# Patient Record
Sex: Male | Born: 1975
Health system: Southern US, Community
[De-identification: ages and names within clinical notes are randomized; demographics above are authoritative.]

## PROBLEM LIST (undated history)

## (undated) ENCOUNTER — Encounter

## (undated) ENCOUNTER — Ambulatory Visit: Payer: MEDICARE

## (undated) ENCOUNTER — Encounter: Attending: Nephrology | Primary: Nephrology

## (undated) ENCOUNTER — Ambulatory Visit: Payer: MEDICARE | Attending: Nephrology | Primary: Nephrology

## (undated) ENCOUNTER — Ambulatory Visit

## (undated) ENCOUNTER — Telehealth

## (undated) ENCOUNTER — Telehealth: Attending: Surgery | Primary: Surgery

## (undated) ENCOUNTER — Encounter: Attending: Physician Assistant | Primary: Physician Assistant

## (undated) ENCOUNTER — Encounter
Attending: Student in an Organized Health Care Education/Training Program | Primary: Student in an Organized Health Care Education/Training Program

## (undated) ENCOUNTER — Encounter: Payer: MEDICARE | Attending: Nephrology | Primary: Nephrology

## (undated) ENCOUNTER — Ambulatory Visit: Payer: MEDICARE | Attending: Surgery | Primary: Surgery

## (undated) DIAGNOSIS — F419 Anxiety disorder, unspecified: Secondary | ICD-10-CM

## (undated) DIAGNOSIS — G2581 Restless legs syndrome: Secondary | ICD-10-CM

## (undated) DIAGNOSIS — K219 Gastro-esophageal reflux disease without esophagitis: Secondary | ICD-10-CM

## (undated) DIAGNOSIS — Q851 Tuberous sclerosis: Secondary | ICD-10-CM

## (undated) DIAGNOSIS — N189 Chronic kidney disease, unspecified: Secondary | ICD-10-CM

## (undated) DIAGNOSIS — I10 Essential (primary) hypertension: Secondary | ICD-10-CM

## (undated) DIAGNOSIS — R519 Headache, unspecified: Secondary | ICD-10-CM

## (undated) DIAGNOSIS — D849 Immunodeficiency, unspecified: Secondary | ICD-10-CM

## (undated) DIAGNOSIS — F988 Other specified behavioral and emotional disorders with onset usually occurring in childhood and adolescence: Secondary | ICD-10-CM

## (undated) DIAGNOSIS — R569 Unspecified convulsions: Secondary | ICD-10-CM

## (undated) DIAGNOSIS — Z973 Presence of spectacles and contact lenses: Secondary | ICD-10-CM

## (undated) DIAGNOSIS — I7121 Aneurysm of the ascending aorta, without rupture: Secondary | ICD-10-CM

## (undated) DIAGNOSIS — A419 Sepsis, unspecified organism: Secondary | ICD-10-CM

## (undated) DIAGNOSIS — R011 Cardiac murmur, unspecified: Secondary | ICD-10-CM

## (undated) DIAGNOSIS — L989 Disorder of the skin and subcutaneous tissue, unspecified: Secondary | ICD-10-CM

## (undated) DIAGNOSIS — R51 Headache: Secondary | ICD-10-CM

## (undated) DIAGNOSIS — Z8489 Family history of other specified conditions: Secondary | ICD-10-CM

## (undated) DIAGNOSIS — L0231 Cutaneous abscess of buttock: Secondary | ICD-10-CM

## (undated) HISTORY — PX: NEPHRECTOMY: SHX65

## (undated) HISTORY — DX: Essential (primary) hypertension: I10

## (undated) HISTORY — DX: Sepsis, unspecified organism: A41.9

## (undated) HISTORY — DX: Cutaneous abscess of buttock: L02.31

## (undated) HISTORY — DX: Chronic kidney disease, unspecified: N18.9

## (undated) HISTORY — DX: Unspecified convulsions: R56.9

## (undated) HISTORY — PX: MULTIPLE TOOTH EXTRACTIONS: SHX2053

## (undated) HISTORY — PX: AV FISTULA PLACEMENT: SHX1204

## (undated) HISTORY — PX: MASS EXCISION: SHX2000

---
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
## 1997-09-01 ENCOUNTER — Other Ambulatory Visit: Admission: RE | Admit: 1997-09-01 | Discharge: 1997-09-01 | Payer: Self-pay | Admitting: Nephrology

## 1997-09-07 ENCOUNTER — Other Ambulatory Visit: Admission: RE | Admit: 1997-09-07 | Discharge: 1997-09-07 | Payer: Self-pay | Admitting: Nephrology

## 1997-09-09 ENCOUNTER — Other Ambulatory Visit: Admission: RE | Admit: 1997-09-09 | Discharge: 1997-09-09 | Payer: Self-pay | Admitting: Nephrology

## 1997-09-29 ENCOUNTER — Other Ambulatory Visit: Admission: RE | Admit: 1997-09-29 | Discharge: 1997-09-29 | Payer: Self-pay | Admitting: Nephrology

## 1997-10-20 ENCOUNTER — Other Ambulatory Visit: Admission: RE | Admit: 1997-10-20 | Discharge: 1997-10-20 | Payer: Self-pay | Admitting: Nephrology

## 1997-11-10 ENCOUNTER — Other Ambulatory Visit: Admission: RE | Admit: 1997-11-10 | Discharge: 1997-11-10 | Payer: Self-pay | Admitting: Nephrology

## 1997-11-17 ENCOUNTER — Encounter (HOSPITAL_COMMUNITY): Admission: RE | Admit: 1997-11-17 | Discharge: 1998-02-15 | Payer: Self-pay | Admitting: Nephrology

## 1997-11-21 ENCOUNTER — Inpatient Hospital Stay (HOSPITAL_COMMUNITY): Admission: EM | Admit: 1997-11-21 | Discharge: 1997-11-30 | Payer: Self-pay | Admitting: Emergency Medicine

## 1998-01-27 ENCOUNTER — Inpatient Hospital Stay (HOSPITAL_COMMUNITY): Admission: RE | Admit: 1998-01-27 | Discharge: 1998-01-27 | Payer: Self-pay | Admitting: Urology

## 1998-02-10 ENCOUNTER — Inpatient Hospital Stay (HOSPITAL_COMMUNITY): Admission: RE | Admit: 1998-02-10 | Discharge: 1998-02-19 | Payer: Self-pay | Admitting: Urology

## 1998-02-14 ENCOUNTER — Encounter: Payer: Self-pay | Admitting: Nephrology

## 1998-08-18 ENCOUNTER — Ambulatory Visit (HOSPITAL_COMMUNITY): Admission: RE | Admit: 1998-08-18 | Discharge: 1998-08-18 | Payer: Self-pay | Admitting: *Deleted

## 1998-09-08 ENCOUNTER — Ambulatory Visit (HOSPITAL_COMMUNITY): Admission: RE | Admit: 1998-09-08 | Discharge: 1998-09-08 | Payer: Self-pay | Admitting: Nephrology

## 1998-11-26 ENCOUNTER — Ambulatory Visit (HOSPITAL_COMMUNITY): Admission: RE | Admit: 1998-11-26 | Discharge: 1998-11-26 | Payer: Self-pay | Admitting: Nephrology

## 1999-02-22 ENCOUNTER — Inpatient Hospital Stay (HOSPITAL_COMMUNITY): Admission: RE | Admit: 1999-02-22 | Discharge: 1999-03-01 | Payer: Self-pay | Admitting: Urology

## 1999-02-23 ENCOUNTER — Encounter: Payer: Self-pay | Admitting: Urology

## 1999-05-16 HISTORY — PX: KIDNEY TRANSPLANT: SHX239

## 1999-05-20 ENCOUNTER — Encounter: Admission: RE | Admit: 1999-05-20 | Discharge: 1999-05-20 | Payer: Self-pay | Admitting: Urology

## 1999-05-23 ENCOUNTER — Encounter: Admission: RE | Admit: 1999-05-23 | Discharge: 1999-05-23 | Payer: Self-pay | Admitting: Urology

## 1999-05-23 ENCOUNTER — Encounter: Payer: Self-pay | Admitting: Urology

## 1999-12-26 ENCOUNTER — Emergency Department (HOSPITAL_COMMUNITY): Admission: EM | Admit: 1999-12-26 | Discharge: 1999-12-26 | Payer: Self-pay | Admitting: *Deleted

## 1999-12-26 ENCOUNTER — Encounter: Payer: Self-pay | Admitting: Emergency Medicine

## 2001-09-01 ENCOUNTER — Encounter: Payer: Self-pay | Admitting: Nephrology

## 2001-09-01 ENCOUNTER — Inpatient Hospital Stay (HOSPITAL_COMMUNITY): Admission: EM | Admit: 2001-09-01 | Discharge: 2001-09-05 | Payer: Self-pay | Admitting: Emergency Medicine

## 2001-09-02 ENCOUNTER — Encounter: Payer: Self-pay | Admitting: Nephrology

## 2001-09-08 ENCOUNTER — Inpatient Hospital Stay (HOSPITAL_COMMUNITY): Admission: EM | Admit: 2001-09-08 | Discharge: 2001-09-10 | Payer: Self-pay | Admitting: Emergency Medicine

## 2002-06-10 ENCOUNTER — Encounter: Admission: RE | Admit: 2002-06-10 | Discharge: 2002-06-10 | Payer: Self-pay | Admitting: Psychology

## 2002-08-08 ENCOUNTER — Encounter: Payer: Self-pay | Admitting: Emergency Medicine

## 2002-08-08 ENCOUNTER — Inpatient Hospital Stay (HOSPITAL_COMMUNITY): Admission: EM | Admit: 2002-08-08 | Discharge: 2002-08-13 | Payer: Self-pay | Admitting: Emergency Medicine

## 2002-08-11 ENCOUNTER — Encounter: Payer: Self-pay | Admitting: Nephrology

## 2003-03-10 ENCOUNTER — Emergency Department (HOSPITAL_COMMUNITY): Admission: AD | Admit: 2003-03-10 | Discharge: 2003-03-10 | Payer: Self-pay | Admitting: Family Medicine

## 2003-04-02 ENCOUNTER — Encounter: Admission: RE | Admit: 2003-04-02 | Discharge: 2003-04-02 | Payer: Self-pay | Admitting: Family Medicine

## 2004-03-13 ENCOUNTER — Emergency Department (HOSPITAL_COMMUNITY): Admission: EM | Admit: 2004-03-13 | Discharge: 2004-03-13 | Payer: Self-pay | Admitting: Family Medicine

## 2005-01-19 ENCOUNTER — Emergency Department (HOSPITAL_COMMUNITY): Admission: EM | Admit: 2005-01-19 | Discharge: 2005-01-19 | Payer: Self-pay | Admitting: Emergency Medicine

## 2005-02-02 ENCOUNTER — Ambulatory Visit: Payer: Self-pay | Admitting: Family Medicine

## 2005-03-28 ENCOUNTER — Emergency Department (HOSPITAL_COMMUNITY): Admission: EM | Admit: 2005-03-28 | Discharge: 2005-03-28 | Payer: Self-pay | Admitting: Family Medicine

## 2005-08-24 ENCOUNTER — Emergency Department (HOSPITAL_COMMUNITY): Admission: EM | Admit: 2005-08-24 | Discharge: 2005-08-24 | Payer: Self-pay | Admitting: Family Medicine

## 2005-09-13 ENCOUNTER — Encounter: Payer: Self-pay | Admitting: Urology

## 2006-02-21 ENCOUNTER — Ambulatory Visit: Payer: Self-pay | Admitting: Sports Medicine

## 2006-02-23 ENCOUNTER — Emergency Department (HOSPITAL_COMMUNITY): Admission: EM | Admit: 2006-02-23 | Discharge: 2006-02-23 | Payer: Self-pay | Admitting: Family Medicine

## 2006-04-20 ENCOUNTER — Inpatient Hospital Stay (HOSPITAL_COMMUNITY): Admission: EM | Admit: 2006-04-20 | Discharge: 2006-04-22 | Payer: Self-pay | Admitting: Emergency Medicine

## 2006-05-17 ENCOUNTER — Ambulatory Visit: Payer: Self-pay

## 2006-05-17 ENCOUNTER — Encounter: Payer: Self-pay | Admitting: Cardiology

## 2007-03-08 ENCOUNTER — Ambulatory Visit: Payer: Self-pay | Admitting: Family Medicine

## 2008-02-19 ENCOUNTER — Ambulatory Visit: Payer: Self-pay | Admitting: Family Medicine

## 2008-09-25 ENCOUNTER — Emergency Department (HOSPITAL_COMMUNITY): Admission: EM | Admit: 2008-09-25 | Discharge: 2008-09-25 | Payer: Self-pay | Admitting: Family Medicine

## 2008-10-21 ENCOUNTER — Emergency Department (HOSPITAL_COMMUNITY): Admission: EM | Admit: 2008-10-21 | Discharge: 2008-10-21 | Payer: Self-pay | Admitting: Emergency Medicine

## 2008-11-25 ENCOUNTER — Ambulatory Visit: Payer: Self-pay | Admitting: Family Medicine

## 2008-11-25 DIAGNOSIS — H9190 Unspecified hearing loss, unspecified ear: Secondary | ICD-10-CM | POA: Insufficient documentation

## 2008-12-31 ENCOUNTER — Ambulatory Visit: Payer: Self-pay | Admitting: Family Medicine

## 2008-12-31 DIAGNOSIS — N186 End stage renal disease: Secondary | ICD-10-CM | POA: Insufficient documentation

## 2008-12-31 DIAGNOSIS — L049 Acute lymphadenitis, unspecified: Secondary | ICD-10-CM | POA: Insufficient documentation

## 2009-02-24 ENCOUNTER — Ambulatory Visit: Payer: Self-pay | Admitting: Family Medicine

## 2010-02-13 ENCOUNTER — Emergency Department (HOSPITAL_COMMUNITY): Admission: EM | Admit: 2010-02-13 | Discharge: 2010-02-13 | Payer: Self-pay | Admitting: Family Medicine

## 2010-02-14 ENCOUNTER — Telehealth: Payer: Self-pay | Admitting: *Deleted

## 2010-02-15 ENCOUNTER — Emergency Department (HOSPITAL_COMMUNITY): Admission: EM | Admit: 2010-02-15 | Discharge: 2010-02-15 | Payer: Self-pay | Admitting: Emergency Medicine

## 2010-06-14 NOTE — Progress Notes (Signed)
Summary: Flu Shot [employer] Note Call from Patient Call back at [employer] [phone]   Caller: mom-Shelby Summary of Call: Pt has had fever off and on over weekend and has cold. Scheduled for flu shot on the 5h can he still have it? Initial call taken by: Clydell Hakim,  February 14, 2010 3:17 PM  Follow-up for Phone Call        LVM for pt to call back. Spoke with lynn also and she advises that if the pt has had a fever to just reschedule flu shot untill he feels better. Follow-up by: Jimmy Footman, CMA,  February 14, 2010 5:26 PM  Additional Follow-up for Phone Call Additional follow up Details #1::        LVM for patient to call back office. Additional Follow-up by: Jimmy Footman, CMA,  February 15, 2010 10:14 AM    Additional Follow-up for Phone Call Additional follow up Details #2::    Attempted one more time to call pt LVM. pt still does have an appt for flu shot tomorrow.  Follow-up by: Jimmy Footman, CMA,  February 15, 2010 2:46 PM  Additional Follow-up for Phone Call Additional follow up Details #3:: Details for Additional Follow-up Action Taken: I could not get in touch with patient nor did they call me back. Today is the pt's appt for flu shot and i could not give them the message Additional Follow-up by: Jimmy Footman, CMA,  February 16, 2010 9:05 AM

## 2010-07-27 LAB — POCT URINALYSIS DIPSTICK
Glucose, UA: NEGATIVE mg/dL
Hgb urine dipstick: NEGATIVE
Nitrite: NEGATIVE
Protein, ur: 100 mg/dL — AB
Urobilinogen, UA: 1 mg/dL (ref 0.0–1.0)
pH: 5.5 (ref 5.0–8.0)

## 2010-07-27 LAB — URINE MICROSCOPIC-ADD ON

## 2010-07-27 LAB — CBC
HCT: 38.6 % — ABNORMAL LOW (ref 39.0–52.0)
MCV: 90 fL (ref 78.0–100.0)
RDW: 12.7 % (ref 11.5–15.5)
WBC: 7 10*3/uL (ref 4.0–10.5)

## 2010-07-27 LAB — URINALYSIS, ROUTINE W REFLEX MICROSCOPIC
Glucose, UA: NEGATIVE mg/dL
Hgb urine dipstick: NEGATIVE
Leukocytes, UA: NEGATIVE
Urobilinogen, UA: 1 mg/dL (ref 0.0–1.0)
pH: 5.5 (ref 5.0–8.0)

## 2010-07-27 LAB — POCT I-STAT, CHEM 8
BUN: 29 mg/dL — ABNORMAL HIGH (ref 6–23)
BUN: 31 mg/dL — ABNORMAL HIGH (ref 6–23)
Calcium, Ion: 1.2 mmol/L (ref 1.12–1.32)
Creatinine, Ser: 1.9 mg/dL — ABNORMAL HIGH (ref 0.4–1.5)
Glucose, Bld: 102 mg/dL — ABNORMAL HIGH (ref 70–99)
Glucose, Bld: 98 mg/dL (ref 70–99)
HCT: 39 % (ref 39.0–52.0)
Hemoglobin: 13.3 g/dL (ref 13.0–17.0)
Potassium: 4.2 mEq/L (ref 3.5–5.1)
TCO2: 27 mmol/L (ref 0–100)
TCO2: 30 mmol/L (ref 0–100)

## 2010-07-27 LAB — DIFFERENTIAL
Lymphocytes Relative: 29 % (ref 12–46)
Lymphs Abs: 2.1 10*3/uL (ref 0.7–4.0)
Monocytes Relative: 9 % (ref 3–12)

## 2010-08-29 ENCOUNTER — Emergency Department (HOSPITAL_COMMUNITY): Payer: Medicaid Other

## 2010-08-29 ENCOUNTER — Emergency Department (HOSPITAL_COMMUNITY)
Admission: EM | Admit: 2010-08-29 | Discharge: 2010-08-29 | Disposition: A | Discharge status: Home / Self Care | Payer: Medicaid Other | Attending: Emergency Medicine | Admitting: Emergency Medicine

## 2010-08-29 DIAGNOSIS — S9030XA Contusion of unspecified foot, initial encounter: Secondary | ICD-10-CM | POA: Insufficient documentation

## 2010-08-29 DIAGNOSIS — X58XXXA Exposure to other specified factors, initial encounter: Secondary | ICD-10-CM | POA: Insufficient documentation

## 2010-09-30 NOTE — H&P (Signed)
NAME:  Tyler Alvarez, Tyler Alvarez NO.:  [id number]   MEDICAL RECORD NO.:  [id number]          PATIENT TYPE:  INP   LOCATION:  5507                         FACILITY:  [facility]   PHYSICIAN:  Terrial Rhodes, M.D.DATE OF BIRTH:  1975/10/15   DATE OF ADMISSION:  04/20/2006  DATE OF DISCHARGE:                              HISTORY & PHYSICAL   RENAL ADMISSION HISTORY AND PHYSICAL   CHIEF COMPLAINT:  Nausea, vomiting and diarrhea.   HISTORY OF PRESENT ILLNESS:  Tyler Alvarez is a 35 year old Caucasian male  with a past medical history significant for end-stage renal disease  secondary to tuberous sclerosis, status post living related kidney  transplant on February 21, 2006 at [facility].  Patient patient's  mother, he had acute onset of nausea, vomiting and diarrhea at  approximately 10 p.m. last night.  He was unable to hold anything down  and had refractory nausea and vomiting.  He could not tolerate sips of  water or soup and EMS was called because he was unable to walk and EMS  was called and brought to the emergency room.  Upon arrival to the  emergency room, he was noted to have a fever of 103.3, was tachycardic  at 151 and his blood pressure was stable.  Pulse oximetry was stable.  He was minimally arousable.  He was resuscitated with IV fluids and his  heart rate improved.  He was also treated with Tylenol and improved his  temperature.  An LP was done due to altered mental status, which was  without evidence of meningitis.  His mother had the same dinner last  night.  She denies any sick contacts.  No one else was sick at home.  He  was feeling well prior to this episode.   ALLERGIES:  TO ERYTHROMYCIN, WHICH CAUSES GI UPSET.   PAST MEDICAL HISTORY:  1. Tuberous sclerosis.      a.     Seizure disorder, on Depakote.      b.     Cystic renal disease, status post left nephrectomy.      c.     Mental retardation, mild.      d.     History of vitreous hemorrhage.      e.      Multiple lipomas.  2. Status post living related kidney transplant to end-stage renal      disease on February 22, 2000.  3. Hypertension.  4. Pyelonephritis.  5. Hypercalcemia, secondary to tertiary hyperparathyroidism.  6. Anemia.  7. Osteoporosis.  8. Bladder dysfunction.   CURRENT MEDICATIONS:  1. Depakote 500 mg a day.  2. Imipramine 50 mg a day.  3. Prednisone 5 mg a day.  4. Prograf 2 mg b.i.d.  5. Baby aspirin 1 a day.  6. Boniva 1 a month.  7. Prilosec OTC 1 a day.   SOCIAL HISTORY:  He lives in [place] with his mother and father.  He  is currently looking for work.  He is single.  No children.  No tobacco.  No alcohol and no drug use.   FAMILY HISTORY:  His  mother is alive at age 33 in good health.  Father  is alive at age 36 with COPD.  He has 2 brothers alive and well.  One of  his brothers had donated a kidney.   REVIEW OF SYSTEMS:  He had fevers, chills, sweats, decreased appetite  and fatigue.  This started yesterday around 10 o'clock, associated with  nausea, vomiting and diarrhea.  HEENT:  He also had developed a headache  in frontal area.  No blurred vision, photophobia.  CARDIAC:  No chest  pain, palpitations, orthopnea, PND.  PULMONARY:  No shortness of breath.  Mild productive cough.  GI:  Nausea, vomiting, diarrhea which was a  loose, tan color, not watery, but loose consistency.  No hematochezia,  melena or bright red blood per rectum.  GU:  No dysuria, pyuria,  hematuria, urgency, frequency or tension.  RHEUMATOLOGIC:  No  arthralgias or myalgias.  NEUROLOGIC:  He had generalized weakness and  fatigue.  DERMATOLOGIC:  No rashes, lumps or bumps.  All other systems  negative.   PHYSICAL EXAMINATION:  GENERAL:  Well-developed, frail man who appears  weak, lying in bed in no apparent distress.  VITAL SIGNS:  Temperature 103.3.  Pulse 151.  Respiratory rate 18.  Blood pressure 151/98.  Pulse oximetry 99%.  HEENT EXAM:  Normocephalic, atraumatic.   Pupils equally round and  reactive to light.  Extraocular muscles intact.  No icterus.  Oropharynx  showed dry, flaky skin with dry mucous membranes.  NECK:  Supple.  No  lymphadenopathy.  No nuchal rigidity.  LUNGS:  Clear to auscultation and percussion bilaterally.  No rales or  rhonchi.  CARDIAC EXAM:  Rate tachycardic with a flow murmur.  No precordial rub  appreciated.  ABDOMINAL EXAM:  Normoactive bowel sounds.  Soft, nontender,  nondistended.  No guarding.  No rebound.  He does have a large scar on  his abdomen.  EXTREMITIES:  No clubbing, cyanosis or edema.  He has a left upper AV  graft, palpable thrill and audible bruit.  NEUROLOGIC:  He is awake, alert and oriented x3.  He was some to  response, but appropriate.  No slurred speech.   LABS:  White blood cell count 6.7, hemoglobin 13.1, platelets 126,  sodium 137, potassium 4.2, chloride 104, CO2 24, BUN 18, creatinine 1.4,  glucose 96, calcium 8.6, albumin 3.6, AST 19, ALT 11.  Depakote level  was 40.1.  Urinalysis showed small bilirubin, negative blood, negative  protein, negative leukocytes, negative nitrites.  Cerebral spinal fluid,  2 white blood cells, 0 red blood cells, glucose 63, total protein 29.   ASSESSMENT/PLAN:  1. Nausea, vomiting and diarrhea.  Question whether or not this is      viral gastroenteritis.  I doubt that this food poisoning since they      had similar food at home.  We will continue with supportive      measures of IV fluids, antiemetics.  He has been pan cultured.  We      will send off stool for C.diff, open parasites, as well stool      cultures and fecal leukocytes.  He has had no vomiting since 3 a.m.  2. Altered mental status.  This is resolved and was likely due to his      acute illness.  Continue with IV fluids.  Lumbar puncture was      negative.  He does have mental retardation, but was appropriate and      awake.  3. Fever.  As above.  Pan cultured.  No antibiotics at this time.  We       will pan culture, hold off on antibiotics, but we may want to put      empiric Cipro.  4. End-stage renal disease, status post living related kidney      transplant.  Creatinine is stable.  Continue with Prograf and      prednisone.  5. Hypertension.  Blood pressure stable.  6. Gastroesophageal reflux disease.  We will change him to an IV      proton pump inhibitor.  7. Seizure disorder.  We will continue with Depakote.           ______________________________  Terrial Rhodes, M.D.     JC/MEDQ  D:  04/20/2006  T:  04/21/2006  Job:  [id number]

## 2010-09-30 NOTE — Discharge Summary (Signed)
NAME:  TRAVIAN, KERNER NO.:  [id number]   MEDICAL RECORD NO.:  [id number]          PATIENT TYPE:  INP   LOCATION:  5507                         FACILITY:  [facility]   PHYSICIAN:  Terrial Rhodes, M.D.DATE OF BIRTH:  [birth year]   DATE OF ADMISSION:  04/20/2006  DATE OF DISCHARGE:  04/22/2006                               DISCHARGE SUMMARY   ADMITTING DIAGNOSES:  1. Nausea, vomiting, diarrhea.  2. Fever.  3. Altered mental status.  4. End-stage renal disease status post living related kidney      transplant.  5. Hypertension.  6. Seizure disorder.   DISCHARGE DIAGNOSES:  1. Nausea, vomiting, diarrhea - resolved felt secondary to viral      gastroenteritis.  2. Altered mental status - resolved.  3. Fever - resolved felt secondary to #1.  4. End-stage renal disease with mild acute renal failure of living      related kidney transplant - resolved, creatinine at baseline.  5. Hypertension.  6. Seizure disorder.   BRIEF HISTORY:  This 35 year old white male with end-stage renal disease  secondary to tuberous sclerosis status post living related kidney  transplant on February 21, 2006 at [facility] is admitted now with  acute onset nausea, vomiting, diarrhea for approximately 12 hours.  He  has been unable to hold down any food, liquid or medications and has  refractory nausea and vomiting.  In the emergency room his fever is  103.3 with a heart rate of 151 and a stable blood pressure.  He was  minimally arousable.  He was aggressively rehydrated with IV fluids  while still in the ER and his heart rate improved as well as his mental  status.  Tylenol brought his temperature down.  A lumbar puncture was  done without any immediate evidence of meningitis.  The patient was  feeling well prior to this episode.  His mother ate the same food as he  did on the night prior to admission.  She denies any sick contacts or  any other illnesses in the home.   LABORATORIES ON ADMISSION:  White count 6700 with 83 segs, 7  lymphocytes, 8 monocytes, 2 eosinophils, 0 basophils.  Sodium 137,  potassium 4.2, chloride 104, CO2 24, glucose 96, BUN 18, creatinine 1.4,  albumin 3.6.  Hemoglobin 13.1, platelets 126,000.  LFTs within normal  limits.  Total bilirubin 1.2.  Valproic acid low at 40.1.  Spinal fluid  appears clear and colorless with 24 red blood cells, 1 white blood cell,  29 protein, 63 glucose.  Urinalysis yellow, clear, specific gravity of  1.020, pH 5.5, small amount of bilirubin, 15 mg/dL ketones, negative  nitrite, protein, glucose.  Chest x-ray:  No acute abnormalities.  CT of  the head:  No acute intracranial findings, lipoma of the corpus callosum  seen and multiple intraparenchymal and periventricular calcifications  noted.   HOSPITAL COURSE:  The patient was admitted to a private renal room,  placed on a clear liquid diet and continued to receive IV fluids.  After  24 hours, creatinine returned slightly elevated at 1.6 and in response  his IV fluid rate was increased.  The patient was given his usual home  medications orally.  He received antiemetics and antidiarrheals.  All  blood, urine, stool and spinal fluid cultures remained negative.  His  fever defervesced.  A one-time dose of Rocephin was given empirically in  the emergency room but continuous antibiotics were not ordered.  Within  48 hours, the patient was at baseline.  He had remained afebrile for 36  hours.  He was eating and drinking and taking his medications well.  He  had no further nausea, vomiting or diarrhea.  Vital signs were within  normal limits.  He was discharged home improved.   It was felt his symptoms were attributable to a viral gastroenteritis  which resolved.  His creatinine returned to a baseline of 1.2 at time of  discharge.   DISCHARGE MEDICATIONS:  1. Depakote 500 mg daily.  2. Imipramine 50 mg daily.  3. Prednisone 5 mg daily.  4. Prograf 2 mg  b.i.d.  5. Baby aspirin 81 mg daily.  6. Boniva one monthly.  7. Prilosec 20 mg over the counter one daily.      Zenovia Jordan, P.A.    ______________________________  Terrial Rhodes, M.D.    RRK/MEDQ  D:  06/28/2006  T:  06/28/2006  Job:  [id number]

## 2010-09-30 NOTE — Discharge Summary (Signed)
[place]. [facility]  Patient:    Tyler Alvarez, MOWERS Visit Number: [id number] MRN: [id number]          Service Type: MED Location: [phone] Attending Physician:  Hans Eden Dictated by:   Maryelizabeth Rowan, M.D. Admit Date:  09/01/2001 Discharge Date: 09/05/2001   CC:         [name] Bihari. Caryn Section, M.D., [facility]  Lyla Son ___________, Kateri Mc, Kidney Transplant Coordinator   Discharge Summary  PRIMARY DIAGNOSIS:  Pyelonephritis.  SECONDARY DIAGNOSES: 1. Dehydration. 2. Status post kidney transplant. 3. Tubular sclerosis. 4. Hypertension secondary to kidney disease. 5. Status post nephrectomy times two. 6. History of renal cell carcinoma.  HOSPITAL COURSE:  This 35 year old male with significant past medical history presented complaining of fever, nausea, vomiting and kidney pain.  Patient states his belly hurt right where his kidney was and he had abrupt onset of nausea and vomiting the day of admission.  Patient was admitted to 5500, received aggressive IV fluids and empiric antibiotic therapy initially with Cipro.  The patient had a rough initial 24 hours with nausea and vomiting times two, but this subsided after his first night.  He received aggressive IV fluids and became afebrile on day two of his hospitalization.  Urine cultures came back with coag negative Staph, sensitive to levofloxacin as well as gentamicin, nitrofurantoin, rifampin, trimethoprim, and vanc.  Patient was switched to IV Tequin until he had been afebrile for over 24 hours at which time he was switched to p.o. antibiotics.  He tolerated this well.  DISCHARGE CONDITION:  Patient was discharged home in stable condition, afebrile and tolerating p.o.s very well and ambulating without difficulty.  DIET:  No restrictions.  WOUND CARE:  None.  DISCHARGE MEDICATIONS: 1. Prograf 3 mg b.i.d. 2. Prednisone 5 mg q.d. 3. Aspirin 325 q.d. 4. Imipramine 25 mg  q.h.s. 5. _____ 40 q.d. 6. Depakote 250 2 pills q.d. 7. Norvasc 2.5 mg q.d. 8. Tequin 400 mg q.d. times 14 days.  SPECIAL INSTRUCTIONS:  Call Lyla Son _____ for an appointment to see a transplant urologist and notify them of this second bout of pyelonephritis as he had pyelo approximately a year ago at this time.  Other follow up is with his primary doctor, Dr. Caryn Section, at [facility] on Wednesday, May 14th at 10:30. Dictated by:   Maryelizabeth Rowan, M.D. Attending Physician:  Hans Eden DD:  09/05/01 TD:  09/05/01 Job: [id number]

## 2010-09-30 NOTE — Discharge Summary (Signed)
[place]. [facility]  Patient:    Tyler Alvarez, Tyler Alvarez Visit Number: [id number] MRN: [id number]          Service Type: MED Location: [id number] Attending Physician:  Maree Krabbe Dictated by:   Ocie Doyne, M.D. Admit Date:  09/08/2001 Discharge Date: 09/10/2001                             Discharge Summary  DISCHARGE DIAGNOSES: 1. Left upper extremity cellulitis versus hematoma. 2. History of renal transplant. 3. History of pyelonephritis, discharged on September 05, 2001. 4. Tuberous sclerosis. 5. Seizure disorder. 6. Hypertension. 7. Gastroesophageal reflux disease. 8. History of renal cell carcinoma status post nephrectomy.  DISCHARGE MEDICATIONS: 1. Prograf 3 mg p.o. b.i.d. 2. Prednisone 5 mg p.o. q.d. 3. Aspirin 325 mg p.o. q.d. 4. Imipramine 25 mg p.o. q.h.s. 5. Nexium 40 mg p.o. q.d. 6. Depakote 250 mg 2 tablets p.o. q.d. 7. Norvasc 2.5 mg p.o. q.d. 8. Bactrim DS 1 tablet p.o. b.i.d. x 7 days.  HISTORY OF PRESENT ILLNESS:  This 35 year old white male, who was recently hospitalized from April 20 to April 24 with pyelonephritis, returned to the emergency room on April 27.  He complained of a two-day history of left upper arm redness, swelling, and pain.  He stated it had begun the day he left the hospital but was progressively worsening.  He denied fever, chills, nausea, vomiting, injury to the arm, or any blood draws at that site.  He does have a fistula, but it was not in use.  He was continuing to take Tequin as prescribed at discharge.  On admission, he was afebrile, and vital signs were stable.  He does not have leukocytosis.  For remainder of exam, refer to admission History & Physical.  HOSPITAL COURSE:  #1 - CELLULITIS VERSUS HEMATOMA:  The left upper extremity fistula appeared to be a focus of infection.  This whole area on exam was extremely indurated, warm, and painful.  He was started on IV vancomycin to cover  gram-positive organisms, and blood cultures were drawn which remained negative throughout the hospitalization.  He was afebrile with no leukocytosis.  On review of his urine culture from the previous weeks admission, his urine grew greater than 100,000 colonies of coagulase-negative staph which was sensitive to levofloxacin, nitrofurantoin, trimethoprim, Sulphamethoxazole, and vancomycin. It was resistant to tetracycline, penicillin, oxacillin, and cefazolin. Because of the location of his symptoms, CVTS was requested evaluate him.  A Doppler ultrasound of the left upper arm revealed proximal anastomosis of the A-V fistula patent.  There was dilatation of the mid portion of the fistula with distal occlusion versus thrombus.  There was no evidence for an abscess. By hospital day #2, some purple-green discoloration was noted around the left upper extremity consistent with resolution of a hematoma.  He remained afebrile and was feeling well.  He was, therefore, discharged home on Bactrim which will adequately cover his recent pyelonephritis as well as gram-positive organism likely to be causative of cellulitis.  #2 - HISTORY OF PYELONEPHRITIS:  He has now had two episodes, most recently September 01, 2001, and also May 2002 for which he was treated in [place].  He does have a renal transplant.  These recurrent infections are very concerning.  He is, therefore, referred back to [facility] for an appointment to see a transplant urologist, and his mother has the telephone number for the transplant coordinator.  Followup will be with Dr. Caryn Section on Wednesday, May 14, at 10:30 at [facility] Dictated by:   Ocie Doyne, M.D. Attending Physician:  Maree Krabbe DD:  09/10/01 TD:  09/10/01 Job: [id number]

## 2010-09-30 NOTE — H&P (Signed)
[place]. [facility]  Patient:    Tyler Alvarez Visit Number: [id number] MRN: [id number]          Service Type: SUR Location: 5500 [id number] Attending Physician:  Laqueta Jean Admit Date:  02/22/1999   CC:         Lucrezia Starch. August Luz, M.D.                         History and Physical  HISTORY OF PRESENT ILLNESS:  Mr. Tyler Alvarez is a 35 year old male, with known tubular sclerosis complex since age 39, with progressive end-stage renal disease.  The patient was in pre-transplant evaluation at [facility], with a history of multiple episodes of gross hematuria, but in September, 1999, he developed a massive retroperitoneal bleed, which required a nephrectomy.  Pathology showed renal cell carcinoma.  The patient then presented for extirpation of the left kidney to rule-out any other renal cell carcinoma.  This was accomplished in October, 1999. The patient did well since that surgery, but on recent CT scan, the patient was  noted to have a cystic fluid filled mass in the left retroperitoneum with a solid base.  This was biopsied, but no diagnosis was rendered.  The x-rays have been reviewed with the patient and the patients parents, and the patient has decided to proceed with exploration and excisional biopsy of the base of the mass.  It was  felt that the patient needs to have this performed in order to qualify for transplantation at [facility].  The patient is admitted after his usual dialysis for surgery tomorrow morning.   PAST MEDICAL HISTORY:  Noncontributory.  SOCIAL HISTORY:  Noncontributory.  FAMILY HISTORY:  Noncontributory.  DIAGNOSES: 1. Tubular sclerosis complex. 2. End-stage renal disease on dialysis. 3. Renal cell carcinoma. 4. Hypertension. 5. Seizure disorder. 6. Attention deficit disorder.  ADMITTING MEDICATIONS: 1. Depakote 250 mg 2 tablets h.s. 2. Atenolol 50 mg q.d. 3. Imipramine 25 mg h.s. 4. Iron 1 per  day.  ADMISSION PHYSICAL EXAMINATION:  VITAL SIGNS:  Temperature 98.3, heart rate 98, respiratory 20, and blood pressure 117/79.  GENERAL:  A thin, white male with two healed chevron incisions on his abdomen.   HEENT:  PERL.  There is facial acne present, typical of tubular sclerosis complex form and pattern.  CHEST:  Scaphoid.  Clear to auscultation and percussion.  COR is S1 and S2 normal without heaves, rubs, or murmurs.  ABDOMEN:  Scaphoid.  His abdomen has two well-healed chevron incisions.  Soft plus bowel sounds without organomegaly.  No masses (CT scan shows left retroperitoneal mass).  GASTROINTESTINAL:  The patient is status post dialysis this morning.  He has working AV fistula present.  GENITALIA:  Normal male external genitalia.  RECTAL:  Not examined this admission.  EXTREMITIES:  Without cyanosis or edema.  NEUROLOGIC:  Physiologic.  ADMITTING IMPRESSION: 1. History of right renal cell carcinoma found on extirpation of right hemorrhaging    renal unit in September, 1999. 2. Status post left nephrectomy in October, 1999, now with a left retroperitoneal    mass without diagnosis on percutaneous biopsy.  PLAN:  Will plan left abdominal exploration and left retroperitoneal excisional  biopsy in the a.m. Attending Physician:  Laqueta Jean DD:  02/22/99 TD:  02/23/99 Job: [id number]

## 2010-09-30 NOTE — Discharge Summary (Signed)
NAME:  Tyler Alvarez, Tyler Alvarez NO.:  [id number]   MEDICAL RECORD NO.:  [id number]                   PATIENT TYPE:  INP   LOCATION:  5525                                 FACILITY:  [facility]   PHYSICIAN:  Hillery Hunter, PA             DATE OF BIRTH:  [birth year]   DATE OF ADMISSION:  08/08/2002  DATE OF DISCHARGE:  08/13/2002                                 DISCHARGE SUMMARY   ADMISSION DIAGNOSES:  The patient was admitted on August 08, 2002 because of  febrile illness and urinary tract infection.   DISCHARGE DIAGNOSES:  The patient was discharged on August 13, 2002 with:  1. Urinary tract infection secondary to methicillin resistant Staphylococcus     epidermidis.   CONSULTATIONS:  Infectious disease on August 11, 2002.   HISTORY OF PRESENT ILLNESS:  The patient was admitted because of urinary  tract infection secondary to MRSE.  The patient with history of  pyelonephritis X2 since left kidney transplant in October of 2001 at [facility].  The patient has a history of renal cell carcinoma, cystic  renal disease, hypertension,  hypercalcemia and seizure disorder.   LABORATORY DATA:  On admission sodium was 138, potassium 3.3, chloride 100,  cO2 27, BUN 17, creatinine 1.6, glucose 93.  White count 10.3, platelet  count 131,000, hemoglobin 14.9, hematocrit 42.3.  Urinalysis showed specific  gravity 1.015, pH 5.5, positive blood, positive moderate leukocytes and few  bacteria.  Albumin 4.7, calcium 10.1.  Chest x-ray negative.  Blood cultures  X2, one out of the two was positive for coagulase-negative Staphylococcus.   HOSPITAL COURSE:  The patient was treated with Phenergan 12.5 intravenous  q.6h. PRN nausea, Solu-Medrol 80 mg intravenous X1 and then prednisone 20 mg  every day and Tylenol suppositories.  The antibiotics that were used were  Tequin 200 mg p.o. then changed to Primaxin 500 mg p.o. b.i.d.  This was  changed to vancomycin 1 gram  intravenous and that was then changed to  Levaquin 500 mg p.o. q.d. and changed to Tequin 400 mg intravenous then  changed to Tequin 400 mg p.o.   Infectious disease was consulted on August 11, 2002 where they questioned  urinary reflux, obtained a renal ultrasound that showed no hydronephrosis or  mass.  They felt it was important to treat him with an indefinite course of  antibiotics.  They recommended finishing a ten day course of Tequin 400 mg  daily and then after that start Cipro 250 mg p.o. daily every day in the  afternoon paying close attention to antacid intake.   His blood pressure was well controlled.  He had no seizure activity.  He was  discharged to home with his family on August 13, 2002 understanding he would  follow a regular diet and he would follow up with Dr. Caryn Section on August 26, 2002  at 2:45 P.Lesle Chris MEDICATIONS:  1. Tequin 400 mg p.o. daily X10 days,  2. Then Cipro 250 mg p.o. daily every afternoon, two hours away from having     taken antacids.  3. Norvasc 200 mg p.o. daily.  4. Imipramine 25 mg p.o. q.h.s.  5. Nexium 40 mg p.o. daily.  6. Depakote 250 mg two tablets once daily.  7. Prograf 1 mg, two capsules twice daily.  8. Prednisone 5 mg p.o. daily.  9. Enteric coated aspirin 325 mg p.o. daily.  10.      Tylenol 325 mg one to two tablets PRN pain.                                               Hillery Hunter, PA    MJG/MEDQ  D:  09/15/2002  T:  09/15/2002  Job:  [id number]

## 2010-09-30 NOTE — H&P (Signed)
[place]  Patient:    BRYSE, BLANCHETTE Visit Number: [id number] MRN: [id number]          Service Type: MED Location: 5500 [id number] [id number] Attending Physician:  Maree Krabbe Dictated by:   Maryelizabeth Rowan, M.D. Admit Date:  09/08/2001                           History and Physical  DATE OF BIRTH:  10/04/1975.  CHIEF COMPLAINT:  Fever, nausea, and vomiting.  HISTORY OF PRESENT ILLNESS:  This 35 year old male, with a significant past medical history of tuberous sclerosis and end-stage renal disease, status post renal transplant, presents today stating that he awoke with a fever, headache, and nausea this morning.  He has had five episodes of emesis so far today. Apparently he had sudden onset in the last 24 hours of this illness as previously he was tolerating p.o. and normal activity.  PAST MEDICAL HISTORY:  Tuberous sclerosis, status post kidney transplant in October 2001 in [place].  Recent history of pyelonephritis May 2002 in [place].  He has a seizure disorder, hypertension secondary to his kidney disease, GERD, nephrectomy x 2 with a history of renal cell carcinoma, ADHD, and a history of perirenal abscess October 2000.  MEDICATIONS:  Include imipramine 25 mg q.h.s., aspirin 325 q.d., Prograf 3 mg b.i.d., prednisone 5 mg q.d., Nexium 40 mg q.d., Depakote 250 two pills q.d., Norvasc 2.5 mg q.d.  ALLERGIES:  ERYTHROMYCIN causes GI upset.  SOCIAL HISTORY:  The patient is a nonsmoker.  He has an eight grade education. He currently lives with his parents and likes to play computer games.  REVIEW OF SYSTEMS:  CONSTITUTIONAL:  Positive fever and chills.  GI:  Nausea, vomiting, and abdominal pain.  RESPIRATORY:  No shortness of breath or wheezing.  CARDIAC:  No chest pain or palpitations.  GU:  Polyuria.  No dysuria or hematuria.  EXTREMITIES:  No edema or weakness.  NEUROLOGIC: Positive headaches.  No ataxia or visual  changes.  HEME:  No easy bruisability.  LYMPH:  No new nodules.  PHYSICAL EXAMINATION:  VITAL SIGNS:  Blood pressure 104/50, pulse 80-90, respirations 20, saturating 100% on room air, temperature 101.5 degrees.  GENERAL:  Alert and oriented x 3.  SKIN:  Pale.  HEENT:  Normocephalic, atraumatic.  Mucosa is dry.  NECK:  Supple.  No JVD.  LUNGS:  CTA.  HEART:  RRR.  ABDOMEN:  Positive bowel sounds.  Tender in the left lower quadrant and right lower quadrant.  Tenderness to palpation of the transplant kidney in the left lower quadrant.  EXTREMITIES:  No edema.  Full range of motion.  LABORATORY DATA:  White count 14.9, ANC 12.3, hemoglobin 13.8, platelets 145. Sodium 134, potassium 3.4, BUN 15, creatinine 1.7.  LFTs are within normal limits.  UA is negative for nitrites, small leukocyte esterase, 15 ketones, micro with few bacteria and 3-6 white blood cells.  ASSESSMENT AND PLAN: #1 - Pyelonephritis in this 35 year old, status post renal transplant.  We will admit to 5500 and start IV antibiotics.  We will start ciprofloxacin.  We will also culture the urine and check a renal ultrasound for hydronephrosis and any possible evidence of reflux.  We will also check blood cultures and continue his immunosuppressive therapy at home.  We will also attempt to obtain old records from [facility] as he has what sounds like recurrent pyelonephritis, and he  will need a workup for some sort of structural abnormalities causing this recurrent infection.  #2 - Dehydration.  We will hydrate with IV fluids. Dictated by:   Maryelizabeth Rowan, M.D. Attending Physician:  Maree Krabbe DD:  09/01/01 TD:  09/01/01 Job: [id number]

## 2010-10-25 ENCOUNTER — Telehealth: Payer: Self-pay | Admitting: *Deleted

## 2010-10-25 NOTE — Telephone Encounter (Signed)
Mother calls stating patient scratched elbow on rusty nail a few days ago and wondering if he needs a tetanus immunization and if he needs appointment with MD in order to recieve this.   He has had a kidney transplant 11 years ago . Consulted Dr. Jennette Kettle and she advises may give tetanus without visit with MD but encourage patient to make and appointment to follow up with MD here to re- establish  since he has not been here in 2 years. He does have a renal doctor that he see regularly.

## 2010-11-08 ENCOUNTER — Encounter: Payer: Self-pay | Admitting: Family Medicine

## 2010-11-08 ENCOUNTER — Ambulatory Visit (INDEPENDENT_AMBULATORY_CARE_PROVIDER_SITE_OTHER): Payer: Medicaid Other | Admitting: Family Medicine

## 2010-11-08 VITALS — BP 142/86 | HR 95 | Temp 98.2°F | Wt 137.0 lb

## 2010-11-08 DIAGNOSIS — Z23 Encounter for immunization: Secondary | ICD-10-CM

## 2010-11-08 DIAGNOSIS — Z0000 Encounter for general adult medical examination without abnormal findings: Secondary | ICD-10-CM

## 2010-11-08 NOTE — Assessment & Plan Note (Signed)
Pt with some chronic medical conditions but seems to be doing fairly well.  He is followed by the appropriate physicians for his kidney transplant, HTN.  He may need a Cardiologist at some point to monitor his murmur but right now he is doing well.

## 2010-11-08 NOTE — Progress Notes (Signed)
  Subjective:    Patient ID: Tyler Alvarez, male    DOB: [birth year], 35 y.o.   MRN: [id number]  HPI Pt is here for an annual exam  Medical conditions 1. Seizures: takes medications as prescribed.  Has not had any seizures.  It has been multiple years since he has had a major seizure 2.  HTN:  He has been taking his blood pressure medication.  He has been checking it at home.  It has been ranging between 120-130/80-90 3. S/p kidney transplant:  Is being followed by Dr. Caryn Section.  It has been working well.  He is taking the Program and Prednisone  Health maintanence He does not drink, smoke, or do drugs He does not exercise Diet includes a lot of junk food  No other questions or concerns   Review of Systems  Constitutional: Negative for fever, chills, fatigue and unexpected weight change.  HENT: Negative for neck pain and neck stiffness.   Eyes: Negative for photophobia and pain.  Respiratory: Negative for chest tightness, shortness of breath and wheezing.   Cardiovascular: Negative for chest pain and leg swelling.  Gastrointestinal: Negative for abdominal distention.  Genitourinary: Negative for difficulty urinating.  Musculoskeletal: Negative for arthralgias.  Neurological: Negative for dizziness, tremors, seizures, speech difficulty, weakness, light-headedness, numbness and headaches.  Hematological: Negative for adenopathy.  Psychiatric/Behavioral: Negative for behavioral problems and agitation.       Objective:   Physical Exam  Vitals reviewed. Constitutional: He is oriented to person, place, and time. No distress.       Thin appearing  HENT:  Head: Normocephalic and atraumatic.  Right Ear: External ear normal.  Left Ear: External ear normal.  Mouth/Throat: No oropharyngeal exudate.  Eyes: Conjunctivae and EOM are normal. Pupils are equal, round, and reactive to light.  Neck: Normal range of motion. Neck supple. No JVD present.  Cardiovascular: Normal rate and regular  rhythm.  Exam reveals no gallop.        3/6 SEM  Pulmonary/Chest: Effort normal and breath sounds normal. No respiratory distress. He has no wheezes.       Left arm AV graft patent  Abdominal: Soft. Bowel sounds are normal. He exhibits no distension. There is no tenderness.  Musculoskeletal: Normal range of motion. He exhibits no edema.  Lymphadenopathy:    He has no cervical adenopathy.  Neurological: He is alert and oriented to person, place, and time. No cranial nerve deficit. Coordination normal.  Skin: Skin is warm and dry. No rash noted. He is not diaphoretic.  Psychiatric:       Strange affect          Assessment & Plan:

## 2011-02-09 ENCOUNTER — Ambulatory Visit (INDEPENDENT_AMBULATORY_CARE_PROVIDER_SITE_OTHER): Payer: Medicaid Other | Admitting: *Deleted

## 2011-02-09 DIAGNOSIS — Z23 Encounter for immunization: Secondary | ICD-10-CM

## 2011-04-20 DIAGNOSIS — G40209 Localization-related (focal) (partial) symptomatic epilepsy and epileptic syndromes with complex partial seizures, not intractable, without status epilepticus: Secondary | ICD-10-CM | POA: Insufficient documentation

## 2011-04-20 DIAGNOSIS — F909 Attention-deficit hyperactivity disorder, unspecified type: Secondary | ICD-10-CM | POA: Insufficient documentation

## 2011-05-23 DIAGNOSIS — Z48298 Encounter for aftercare following other organ transplant: Secondary | ICD-10-CM | POA: Insufficient documentation

## 2011-11-02 ENCOUNTER — Emergency Department (HOSPITAL_COMMUNITY)
Admission: EM | Admit: 2011-11-02 | Discharge: 2011-11-02 | Disposition: A | Discharge status: Home / Self Care | Payer: Medicaid Other | Source: Home / Self Care | Attending: Emergency Medicine | Admitting: Emergency Medicine

## 2011-11-02 ENCOUNTER — Encounter (HOSPITAL_COMMUNITY): Payer: Self-pay | Admitting: Emergency Medicine

## 2011-11-02 DIAGNOSIS — L089 Local infection of the skin and subcutaneous tissue, unspecified: Secondary | ICD-10-CM

## 2011-11-02 HISTORY — DX: Immunodeficiency, unspecified: D84.9

## 2011-11-02 HISTORY — DX: Tuberous sclerosis: Q85.1

## 2011-11-02 HISTORY — DX: Other specified behavioral and emotional disorders with onset usually occurring in childhood and adolescence: F98.8

## 2011-11-02 MED ORDER — MUPIROCIN 2 % EX OINT: TOPICAL_OINTMENT | Freq: Three times a day (TID) | CUTANEOUS | Status: AC

## 2011-11-02 MED ORDER — BACITRACIN 500 UNIT/GM EX OINT
1.0000 "application " | TOPICAL_OINTMENT | Freq: Once | CUTANEOUS | Status: AC
  Administered 2011-11-02: 1 via TOPICAL

## 2011-11-02 MED ORDER — SULFAMETHOXAZOLE-TRIMETHOPRIM 800-160 MG PO TABS: 1.0000 | ORAL_TABLET | Freq: Two times a day (BID) | ORAL | Status: DC

## 2011-11-02 NOTE — ED Provider Notes (Signed)
History     CSN: [id number]  Arrival date & time 11/02/11  1557   First MD Initiated Contact with Patient 11/02/11 1610      Chief Complaint  Patient presents with  . Abscess    (Consider location/radiation/quality/duration/timing/severity/associated sxs/prior treatment) HPI Comments: Patient with a mildly tender, erythematous mass with discoloration that appears to be a blood blister of gradually increasing size in his left neck. He has a history of tuberous sclerosis, and has multiple skin growths in this area. States that he had a whitehead, and that he has been "squeezing on it," but He does not recall any trauma to the  No drainage. No nausea, vomiting, fevers. No other, similar lesions. Patient has a history of bilateral kidney transplant, is on Prograf. He's doing well with this.  ROS as noted in HPI. All other ROS negative.    Patient is a 36 y.o. male presenting with abscess. The history is provided by the patient. No language interpreter was used.  Abscess  This is a new problem. The current episode started less than one week ago. The onset was gradual. The problem occurs rarely. The problem has been unchanged. The abscess is present on the neck. The abscess is characterized by redness and blistering. It is unknown what he was exposed to. The abscess first occurred at home. Pertinent negatives include no fever.    Past Medical History  Diagnosis Date  . Hypertension   . Chronic kidney disease     s/p transplant  . Seizures     treated by Dr. Malvin Johns  . ADD (attention deficit disorder)   . Tuberous sclerosis   . Immune deficiency disorder     Past Surgical History  Procedure Date  . Kidney transplant   . Av fistula placement     History reviewed. No pertinent family history.  History  Substance Use Topics  . Smoking status: Never Smoker   . Smokeless tobacco: Not on file  . Alcohol Use: No      Review of Systems  Constitutional: Negative for fever.     Allergies  Amoxicillin and Erythromycin  Home Medications   Current Outpatient Rx  Name Route Sig Dispense Refill  . AMLODIPINE BESYLATE 5 MG PO TABS Oral Take 2.5 mg by mouth daily.     . ASPIRIN 81 MG PO TABS Oral Take 81 mg by mouth daily.      Marland Kitchen DIVALPROEX SODIUM 250 MG PO TBEC Oral Take 500 mg by mouth 3 (three) times daily.      Marland Kitchen GABAPENTIN 300 MG PO CAPS Oral Take 300 mg by mouth daily.      . IMIPRAMINE HCL 25 MG PO TABS Oral Take 25 mg by mouth at bedtime.      Marland Kitchen MUPIROCIN 2 % EX OINT Topical Apply topically 3 (three) times daily. Apply after warm soak for 10 minutes 22 g 0  . OMEPRAZOLE 20 MG PO CPDR Oral Take 20 mg by mouth daily.      Marland Kitchen PREDNISONE 5 MG PO TABS Oral Take 5 mg by mouth daily.      . SULFAMETHOXAZOLE-TRIMETHOPRIM 800-160 MG PO TABS Oral Take 1 tablet by mouth 2 (two) times daily. X 10 days 20 tablet 0  . TACROLIMUS 1 MG PO CAPS Oral Take 1 mg by mouth 2 (two) times daily.        BP 125/79  Pulse 94  Temp 98 F (36.7 C) (Oral)  Resp 18  SpO2 100%  Physical Exam  Nursing note and vitals reviewed. Constitutional: He is oriented to person, place, and time. He appears well-developed and well-nourished.  HENT:  Head: Normocephalic and atraumatic.  Eyes: Conjunctivae and EOM are normal.  Neck: Normal range of motion.         5 x 5 mm raised, mildly tender mass on his left neck. Central whitehead, some discoloration consistent with bleeding under the skin Central fluctuance. See drawing  Cardiovascular: Normal rate.   Pulmonary/Chest: Effort normal. No respiratory distress.  Abdominal: He exhibits no distension.  Musculoskeletal: Normal range of motion.  Neurological: He is alert and oriented to person, place, and time.  Skin: Skin is warm and dry.  Psychiatric: He has a normal mood and affect. His behavior is normal.    ED Course  INCISION AND DRAINAGE Date/Time: 11/02/2011 6:22 PM Performed by: Luiz Blare Authorized by: Luiz Blare Consent: Verbal consent obtained. Risks and benefits: risks, benefits and alternatives were discussed Consent given by: patient Patient understanding: patient states understanding of the procedure being performed Patient consent: the patient's understanding of the procedure matches consent given Required items: required blood products, implants, devices, and special equipment available Patient identity confirmed: verbally with patient Time out: Immediately prior to procedure a "time out" was called to verify the correct patient, procedure, equipment, support staff and site/side marked as required. Body area: head/neck Location details: neck Patient sedated: no Needle gauge: 18 Incision type: single straight Drainage: purulent and bloody Drainage amount: moderate Wound treatment: wound left open Patient tolerance: Patient tolerated the procedure well with no immediate complications. Comments: Bacitracin and sterile dressing applied. 5 x 5 mm tneder erythematous infected whitehead.   (including critical care time)   Labs Reviewed  CULTURE, ROUTINE-ABSCESS   No results found.   1. Skin infection     MDM  Sent off for culture. Appears to be an infected sebaceous plug. Will send home on some Bactrim and some Bactroban as well. Discussed signs and symptoms that should prompt has returned. Patient and family agree with plan.  Luiz Blare, MD 11/02/11 [phone]

## 2011-11-02 NOTE — ED Notes (Signed)
PT HERE WITH BLOOD FILLED BLISTER TO LEFT NECK WITH MIN ITCHING THAT FORMED X4 DYS AGO.STATES THE BUMPS AROUND BLISTER HAS ALREADY BEEN THERE HX TUBERIS SCLEROSIS.SOFT TO THE TOUCH AND STARTED OFF AS A WHITE HEAD

## 2011-11-02 NOTE — Discharge Instructions (Signed)
Give [place] a working phone number so that we can change your antibiotics if needed. Return here or follow up with your doctor in 2 days for a wound check. Return sooner if you get worse, have a fever >100.4, or any other concerns. Take the medication as written.

## 2011-11-02 NOTE — ED Notes (Signed)
Bacitracin applied to wound and covered with telfa and medipore dressing

## 2011-11-03 ENCOUNTER — Telehealth (HOSPITAL_COMMUNITY): Payer: Self-pay | Admitting: *Deleted

## 2011-11-03 NOTE — ED Notes (Signed)
[facility] called this morning and said pt. is allergic to sulfa drugs on their profile. I checked pt.'s record and it only has Amoxicillin and Erythromycin.  I tried to call pt. to verify allergies. His mailbox was full. I called his contact and left a message. I called the pharmacy back and told them Dr. Chaney Malling would be here at 1500.  1600 Pt. called back and verified he is allergic to sulfa drugs but can't remember if it is a rash or vomiting. He said he forgot to tell the doctor about it.  He said Amoxicillin causes a rash and E-mycin causes vomiting. I told him I would call after Rx. is called in.  Discussed with Dr. Chaney Malling and she said to check culture report. I told her it said no growth x 1 day. The gram stain said no WBC's, no organisms seen, and rare epithelial cells.  She said there is nothing to treat at this time. Tell pt. to continue Bactroban and call if getting worse. She would start antibiotics at that time. I called pt. back and gave him this information.  He voiced understanding. Vassie Moselle 11/03/2011

## 2011-11-05 LAB — CULTURE, ROUTINE-ABSCESS
Culture: NO GROWTH
Gram Stain: NONE SEEN

## 2011-11-30 ENCOUNTER — Encounter (HOSPITAL_COMMUNITY): Payer: Self-pay | Admitting: *Deleted

## 2011-11-30 ENCOUNTER — Emergency Department (HOSPITAL_COMMUNITY)
Admission: EM | Admit: 2011-11-30 | Discharge: 2011-12-01 | Disposition: A | Discharge status: Home / Self Care | Payer: Medicaid Other | Attending: Emergency Medicine | Admitting: Emergency Medicine

## 2011-11-30 ENCOUNTER — Emergency Department (HOSPITAL_COMMUNITY)
Admission: EM | Admit: 2011-11-30 | Discharge: 2011-11-30 | Disposition: A | Discharge status: Nursing Home (Includes ICF) | Payer: Medicaid Other | Source: Home / Self Care | Attending: Emergency Medicine | Admitting: Emergency Medicine

## 2011-11-30 ENCOUNTER — Emergency Department (INDEPENDENT_AMBULATORY_CARE_PROVIDER_SITE_OTHER): Payer: Medicaid Other

## 2011-11-30 ENCOUNTER — Emergency Department (HOSPITAL_COMMUNITY): Payer: Medicaid Other

## 2011-11-30 DIAGNOSIS — T185XXA Foreign body in anus and rectum, initial encounter: Secondary | ICD-10-CM

## 2011-11-30 DIAGNOSIS — N189 Chronic kidney disease, unspecified: Secondary | ICD-10-CM | POA: Insufficient documentation

## 2011-11-30 DIAGNOSIS — G40909 Epilepsy, unspecified, not intractable, without status epilepticus: Secondary | ICD-10-CM | POA: Insufficient documentation

## 2011-11-30 DIAGNOSIS — I129 Hypertensive chronic kidney disease with stage 1 through stage 4 chronic kidney disease, or unspecified chronic kidney disease: Secondary | ICD-10-CM | POA: Insufficient documentation

## 2011-11-30 DIAGNOSIS — Z880 Allergy status to penicillin: Secondary | ICD-10-CM | POA: Insufficient documentation

## 2011-11-30 DIAGNOSIS — IMO0002 Reserved for concepts with insufficient information to code with codable children: Secondary | ICD-10-CM | POA: Insufficient documentation

## 2011-11-30 DIAGNOSIS — F988 Other specified behavioral and emotional disorders with onset usually occurring in childhood and adolescence: Secondary | ICD-10-CM | POA: Insufficient documentation

## 2011-11-30 DIAGNOSIS — Z940 Kidney transplant status: Secondary | ICD-10-CM | POA: Insufficient documentation

## 2011-11-30 DIAGNOSIS — Z882 Allergy status to sulfonamides status: Secondary | ICD-10-CM | POA: Insufficient documentation

## 2011-11-30 NOTE — ED Notes (Signed)
Pt  Reports  He  Has  A  fb  In  His  Rectum      For  About  24  Hours        -  He  Reports  It  Is  A  Wooden ball on a  String  [employer]  Is  Round  And  Perhaps  The  Size  Of a  Large  Marble  He  States -      The  Pt  Is  Alert  And  Oriented  He  Is  In  No   Severe  Distress          He  Is  Ambulatory  With a  Steady  Fluid  Gait    He  Reports  The     Ball  Was  Placed  There      By  A  [employer]

## 2011-11-30 NOTE — ED Notes (Signed)
Patient here with complain of having a foreign object  Stuck in his rectum.  He states that it is ball about a size of ping pong ball.  Patient is having slight discomfort.

## 2011-11-30 NOTE — ED Provider Notes (Addendum)
History     CSN: [id number]  Arrival date & time 11/30/11  1910   First MD Initiated Contact with Patient 11/30/11 2322      Chief Complaint  Patient presents with  . Foreign Body    (Consider location/radiation/quality/duration/timing/severity/associated sxs/prior treatment) Patient is a 36 y.o. male presenting with foreign body. The history is provided by the patient.  Foreign Body  The current episode started 3 to 5 hours ago. The foreign body is suspected to be in the rectum. The foreign body is a toy. The incident was witnessed/reported by the patient. Pertinent negatives include no abdominal pain and no vomiting.    Past Medical History  Diagnosis Date  . Hypertension   . Chronic kidney disease     s/p transplant  . Seizures     treated by Dr. Malvin Johns  . ADD (attention deficit disorder)   . Tuberous sclerosis   . Immune deficiency disorder     Past Surgical History  Procedure Date  . Kidney transplant   . Av fistula placement     History reviewed. No pertinent family history.  History  Substance Use Topics  . Smoking status: Never Smoker   . Smokeless tobacco: Not on file  . Alcohol Use: No      Review of Systems  Gastrointestinal: Negative for vomiting and abdominal pain.       Rectal foreign body with string attached noted.  No bleeding noted  All other systems reviewed and are negative.    Allergies  Amoxicillin; Erythromycin; and Sulfa antibiotics  Home Medications   Current Outpatient Rx  Name Route Sig Dispense Refill  . AMLODIPINE BESYLATE 5 MG PO TABS Oral Take 5 mg by mouth daily.     . ASPIRIN 81 MG PO TABS Oral Take 81 mg by mouth daily.     Marland Kitchen DIVALPROEX SODIUM 250 MG PO TBEC Oral Take 500 mg by mouth every evening.     Marland Kitchen GABAPENTIN 300 MG PO CAPS Oral Take 300 mg by mouth daily.     . IMIPRAMINE HCL 25 MG PO TABS Oral Take 50 mg by mouth at bedtime.     . OMEPRAZOLE 20 MG PO CPDR Oral Take 20 mg by mouth daily.      Marland Kitchen PREDNISONE 5  MG PO TABS Oral Take 5 mg by mouth 2 (two) times daily.     Marland Kitchen TACROLIMUS 1 MG PO CAPS Oral Take 1 mg by mouth 2 (two) times daily.        BP 130/73  Pulse 90  Temp 98 F (36.7 C) (Oral)  Resp 20  SpO2 99%  Physical Exam  Constitutional: He is oriented to person, place, and time. He appears well-developed and well-nourished.  HENT:  Head: Normocephalic and atraumatic.  Eyes: Conjunctivae are normal. Pupils are equal, round, and reactive to light.  Neck: Normal range of motion. Neck supple.  Cardiovascular: Normal rate, regular rhythm, normal heart sounds and intact distal pulses.   Pulmonary/Chest: Effort normal and breath sounds normal.  Abdominal: Soft. Bowel sounds are normal.       Rectal foreign body with string noted.  No bleeding noted  Neurological: He is alert and oriented to person, place, and time.  Skin: Skin is warm and dry.  Psychiatric: He has a normal mood and affect. His behavior is normal. Judgment and thought content normal.    ED Course  Procedures (including critical care time)  Labs Reviewed - No data to display Dg  Abd 1 View  11/30/2011  *RADIOLOGY REPORT*  Clinical Data: Foreign body.  Children's ball in cup toy in the rectum.  ABDOMEN - 1 VIEW  Comparison: 11/20/2011 1844 hours.  Findings: 4.5 cm diameter rounded lucency again demonstrated in the low pelvis consistent with foreign body.  No significant change in appearance or position since previous study.  Surgical clips in the abdomen.  Normal bowel gas pattern with scattered gas and stool in the colon.  No small or large bowel distension.  No radiopaque stones.  IMPRESSION: Rounded lucency in the low pelvis at the midline consistent with foreign body in the rectum.  No significant change since previous study.  Nonobstructive bowel gas pattern.  Original Report Authenticated By: Marlon Pel, M.D.   Dg Abd 1 View  11/30/2011  *RADIOLOGY REPORT*  Clinical Data: Rectal foreign body  ABDOMEN - 1 VIEW   Comparison: None.  Findings: A 4.5 cm radiolucent object projects over the lower mid pelvis.  No proximal bowel obstruction.  Numerous surgical clips present.  No acute osseous finding.  IMPRESSION: 4.5 cm presumed foreign body projects over the lower mid pelvis. No proximal bowel obstruction.  Original Report Authenticated By: Harrel Lemon, M.D.     No diagnosis found.    MDM  + rectal foreign body.  Will attempted manual removal.  If unsuccessful,  Consult surgery  Foreign body removed.  No perforation seen.  Will dc to fu outpt,  Ret new/worsening sxs      [name], MD 11/30/11 2347  [name], MD 12/01/11 1914

## 2011-11-30 NOTE — ED Provider Notes (Addendum)
History     CSN: [id number]  Arrival date & time 11/30/11  1811   First MD Initiated Contact with Patient 11/30/11 1815      Chief Complaint  Patient presents with  . Foreign Body in Rectum    (Consider location/radiation/quality/duration/timing/severity/associated sxs/prior treatment) HPI Comments: Patient presents to the urgent care this afternoon complaining of rectal pain for the last 24 hours. He reports that while performing sexual activity the object is a PERINOLA consists of a singular wooden ball on a string got stuck inside of his rectum. He reports this was a mistake. Denies any abdominal pain, or rectal bleeding.  Patient is a 36 y.o. male presenting with foreign body in rectum. The history is provided by the patient.  Foreign Body in Rectum This is a new problem. The current episode started 12 to 24 hours ago. The problem occurs constantly. The problem has been gradually worsening. Pertinent negatives include no abdominal pain. The symptoms are aggravated by coughing and walking. Nothing relieves the symptoms. He has tried nothing for the symptoms. The treatment provided no relief.    Past Medical History  Diagnosis Date  . Hypertension   . Chronic kidney disease     s/p transplant  . Seizures     treated by Dr. Malvin Johns  . ADD (attention deficit disorder)   . Tuberous sclerosis   . Immune deficiency disorder     Past Surgical History  Procedure Date  . Kidney transplant   . Av fistula placement     No family history on file.  History  Substance Use Topics  . Smoking status: Never Smoker   . Smokeless tobacco: Not on file  . Alcohol Use: No      Review of Systems  Constitutional: Negative for chills, diaphoresis, activity change and appetite change.  Gastrointestinal: Negative for nausea, abdominal pain, diarrhea, constipation, blood in stool, abdominal distention and anal bleeding.  Skin: Negative for rash.    Allergies  Amoxicillin and  Erythromycin  Home Medications   Current Outpatient Rx  Name Route Sig Dispense Refill  . AMLODIPINE BESYLATE 5 MG PO TABS Oral Take 2.5 mg by mouth daily.     . ASPIRIN 81 MG PO TABS Oral Take 81 mg by mouth daily.      Marland Kitchen DIVALPROEX SODIUM 250 MG PO TBEC Oral Take 500 mg by mouth 3 (three) times daily.      Marland Kitchen GABAPENTIN 300 MG PO CAPS Oral Take 300 mg by mouth daily.      . IMIPRAMINE HCL 25 MG PO TABS Oral Take 25 mg by mouth at bedtime.      . OMEPRAZOLE 20 MG PO CPDR Oral Take 20 mg by mouth daily.      Marland Kitchen PREDNISONE 5 MG PO TABS Oral Take 5 mg by mouth daily.      Marland Kitchen TACROLIMUS 1 MG PO CAPS Oral Take 1 mg by mouth 2 (two) times daily.        BP 151/87  Pulse 111  Temp 98.2 F (36.8 C) (Oral)  Resp 18  SpO2 98%  Physical Exam  Nursing note and vitals reviewed. Constitutional: He appears well-developed and well-nourished.  Abdominal: Soft. There is no tenderness. There is no rebound.  Genitourinary: Testes normal.  Skin: Skin is warm.       ED Course  Procedures (including critical care time)  Labs Reviewed - No data to display Dg Abd 1 View  11/30/2011  *RADIOLOGY REPORT*  Clinical Data:  Rectal foreign body  ABDOMEN - 1 VIEW  Comparison: None.  Findings: A 4.5 cm radiolucent object projects over the lower mid pelvis.  No proximal bowel obstruction.  Numerous surgical clips present.  No acute osseous finding.  IMPRESSION: 4.5 cm presumed foreign body projects over the lower mid pelvis. No proximal bowel obstruction.  Original Report Authenticated By: Harrel Lemon, M.D.     1. Rectal foreign body     MDM  Accidentally inserted a PERINOLA during sexual activity in his rectum. Rectal foramen body, demonstrated on plain films. Patient is not having rectal bleeding or spontaneous abdominal pain. Have called surgical service on call alert Dr. Janee Morn, and we are transferring patient to the main emergency department.        Jimmie Molly, MD 11/30/11 1936  Jimmie Molly, MD 11/30/11 [phone]

## 2011-12-01 ENCOUNTER — Emergency Department (HOSPITAL_COMMUNITY): Payer: Medicaid Other

## 2012-03-18 ENCOUNTER — Ambulatory Visit (INDEPENDENT_AMBULATORY_CARE_PROVIDER_SITE_OTHER): Payer: Medicaid Other | Admitting: Family Medicine

## 2012-03-18 ENCOUNTER — Emergency Department (HOSPITAL_COMMUNITY)
Admission: EM | Admit: 2012-03-18 | Discharge: 2012-03-18 | Disposition: A | Discharge status: Other Acute Inpatient Hospital | Payer: Medicaid Other | Source: Home / Self Care

## 2012-03-18 VITALS — BP 138/86 | HR 88 | Temp 97.8°F | Ht 71.0 in | Wt 134.0 lb

## 2012-03-18 DIAGNOSIS — J069 Acute upper respiratory infection, unspecified: Secondary | ICD-10-CM

## 2012-03-18 NOTE — Patient Instructions (Addendum)
It was nice to meet you today.  I think that you have a cold-- I do not think you need any antibiotics.  Make sure you drink plenty of fluids to stay well hydrated and get some rest.  You can use over the counter cough and cold medicines if you feel like you really need them.  You can also try nasal saline rinses to help clear out your nose.  Please come back if you start having high fevers >101, for a few days, you get short of breath, your cough gets much worse, or if you start feeling a lot worse.  Also come back if you are still sick on Thursday or Friday and we will discuss if you need an antibiotic.   Upper Respiratory Infection, Adult An upper respiratory infection (URI) is also sometimes known as the common cold. The upper respiratory tract includes the nose, sinuses, throat, trachea, and bronchi. Bronchi are the airways leading to the lungs. Most people improve within 1 week, but symptoms can last up to 2 weeks. A residual cough may last even longer.  CAUSES Many different viruses can infect the tissues lining the upper respiratory tract. The tissues become irritated and inflamed and often become very moist. Mucus production is also common. A cold is contagious. You can easily spread the virus to others by oral contact. This includes kissing, sharing a glass, coughing, or sneezing. Touching your mouth or nose and then touching a surface, which is then touched by another person, can also spread the virus. SYMPTOMS  Symptoms typically develop 1 to 3 days after you come in contact with a cold virus. Symptoms vary from person to person. They may include:  Runny nose.  Sneezing.  Nasal congestion.  Sinus irritation.  Sore throat.  Loss of voice (laryngitis).  Cough.  Fatigue.  Muscle aches.  Loss of appetite.  Headache.  Low-grade fever. DIAGNOSIS  You might diagnose your own cold based on familiar symptoms, since most people get a cold 2 to 3 times a year. Your caregiver  can confirm this based on your exam. Most importantly, your caregiver can check that your symptoms are not due to another disease such as strep throat, sinusitis, pneumonia, asthma, or epiglottitis. Blood tests, throat tests, and X-rays are not necessary to diagnose a common cold, but they may sometimes be helpful in excluding other more serious diseases. Your caregiver will decide if any further tests are required. RISKS AND COMPLICATIONS  You may be at risk for a more severe case of the common cold if you smoke cigarettes, have chronic heart disease (such as heart failure) or lung disease (such as asthma), or if you have a weakened immune system. The very young and very old are also at risk for more serious infections. Bacterial sinusitis, middle ear infections, and bacterial pneumonia can complicate the common cold. The common cold can worsen asthma and chronic obstructive pulmonary disease (COPD). Sometimes, these complications can require emergency medical care and may be life-threatening. PREVENTION  The best way to protect against getting a cold is to practice good hygiene. Avoid oral or hand contact with people with cold symptoms. Wash your hands often if contact occurs. There is no clear evidence that vitamin C, vitamin E, echinacea, or exercise reduces the chance of developing a cold. However, it is always recommended to get plenty of rest and practice good nutrition. TREATMENT  Treatment is directed at relieving symptoms. There is no cure. Antibiotics are not effective, because the infection  is caused by a virus, not by bacteria. Treatment may include:  Increased fluid intake. Sports drinks offer valuable electrolytes, sugars, and fluids.  Breathing heated mist or steam (vaporizer or shower).  Eating chicken soup or other clear broths, and maintaining good nutrition.  Getting plenty of rest.  Using gargles or lozenges for comfort.  Controlling fevers with ibuprofen or acetaminophen as  directed by your caregiver.  Increasing usage of your inhaler if you have asthma. Zinc gel and zinc lozenges, taken in the first 24 hours of the common cold, can shorten the duration and lessen the severity of symptoms. Pain medicines may help with fever, muscle aches, and throat pain. A variety of non-prescription medicines are available to treat congestion and runny nose. Your caregiver can make recommendations and may suggest nasal or lung inhalers for other symptoms.  HOME CARE INSTRUCTIONS   Only take over-the-counter or prescription medicines for pain, discomfort, or fever as directed by your caregiver.  Use a warm mist humidifier or inhale steam from a shower to increase air moisture. This may keep secretions moist and make it easier to breathe.  Drink enough water and fluids to keep your urine clear or pale yellow.  Rest as needed.  Return to work when your temperature has returned to normal or as your caregiver advises. You may need to stay home longer to avoid infecting others. You can also use a face mask and careful hand washing to prevent spread of the virus. SEEK MEDICAL CARE IF:   After the first few days, you feel you are getting worse rather than better.  You need your caregiver's advice about medicines to control symptoms.  You develop chills, worsening shortness of breath, or brown or red sputum. These may be signs of pneumonia.  You develop yellow or brown nasal discharge or pain in the face, especially when you bend forward. These may be signs of sinusitis.  You develop a fever, swollen neck glands, pain with swallowing, or white areas in the back of your throat. These may be signs of strep throat. SEEK IMMEDIATE MEDICAL CARE IF:   You have a fever.  You develop severe or persistent headache, ear pain, sinus pain, or chest pain.  You develop wheezing, a prolonged cough, cough up blood, or have a change in your usual mucus (if you have chronic lung disease).  You  develop sore muscles or a stiff neck. Document Released: 10/25/2000 Document Revised: 07/24/2011 Document Reviewed: 09/02/2010 [facility] Patient Information 2013 [place], [place].

## 2012-03-18 NOTE — Progress Notes (Signed)
S: Pt comes in today for SDA for cold-like symptoms.  Pt reports cough, runny nose, and red throat since Saturday (2 days). Of note, patient is on chronic immunosuppression with prograf and prednisone for kidney transplant in 2001.  He has had no fevers, just wanted to come in and get checked to make sure he doesn't get sicker.  No sick contacts. No N/V/D.  Has already gotten his flu shot this year.  No SOB, chest pain, or pain with breathing.     ROS: Per HPI  History  Smoking status  . Never Smoker   Smokeless tobacco  . Not on file    O:  Filed Vitals:   03/18/12 1053  BP: 138/86  Pulse: 88  Temp: 97.8 F (36.6 C)    Gen: NAD HEENT: MMM, mild pharyngeal erythema without exudate, no LAD, + chronic deformity on L side of neck, + clear rhinorrhea without nasal mucosa erythema or bogginess, no sinus TTP CV: RRR, no murmur Pulm: CTA bilat, no wheezes or crackles Ext: Warm, no edema; + fistula LUE with good bruit   A/P: 36 y.o. male p/w viral URI on immunosuppression  -See problem list -f/u in 3-4 days if not improving

## 2012-03-18 NOTE — Assessment & Plan Note (Signed)
No red flags for bacterial infection at this time, afebrile and relatively well appearing.  Will have him f/u by end of the week if still with symptoms to ensure he does not need abx before the weekend given his immunosuppressed status.  Symptomatic treatment at this time.

## 2012-04-05 ENCOUNTER — Ambulatory Visit (INDEPENDENT_AMBULATORY_CARE_PROVIDER_SITE_OTHER): Payer: Medicaid Other | Admitting: Family Medicine

## 2012-04-05 ENCOUNTER — Telehealth: Payer: Self-pay | Admitting: *Deleted

## 2012-04-05 VITALS — BP 153/93 | HR 97 | Temp 98.3°F | Ht 71.0 in | Wt 131.0 lb

## 2012-04-05 DIAGNOSIS — Q851 Tuberous sclerosis: Secondary | ICD-10-CM | POA: Insufficient documentation

## 2012-04-05 DIAGNOSIS — L0211 Cutaneous abscess of neck: Secondary | ICD-10-CM | POA: Insufficient documentation

## 2012-04-05 MED ORDER — DOXYCYCLINE HYCLATE 100 MG PO TABS: 100.0000 mg | ORAL_TABLET | Freq: Two times a day (BID) | ORAL | Status: DC

## 2012-04-05 NOTE — Telephone Encounter (Signed)
Called pt and informed, that we were unable to schedule the appt for the ENT referral, because we are not on his [place] Access. He needs to have the card changed to [place]. Pt understands and is working on it. Lorenda Hatchet, Renato Battles

## 2012-04-05 NOTE — Patient Instructions (Addendum)
It was a pleasure to see you today.  I performed an incision and drainage of the superficial portion of the abscess on the left side of your neck.   I sent a prescription for DOXYCYCLINE 100mg  tablets, take 1 tablet twice daily for 10 days.   I recommend an ENT referral for evaluation of this problem, which appears to have recurred over the past 6 months or so based on our records (ED visit 11/02/2011, subsequent visits in our office).  Follow up with Dr Lula Olszewski in the coming 2 weeks.

## 2012-04-05 NOTE — Progress Notes (Signed)
  Subjective:    Patient ID: Tyler Alvarez, male    DOB: 01-07-1976, 36 y.o.   MRN: [id number]  HPI Patient presents with 3-4 day worsening of L neck abscess, which has started to drain pus in the past 2 days.  No fevers or chills; has not impacted swallowing or breathing.  Otherwise feels well.   Of note, patient has been seen for similar complaint in ED on 11/02/2011 and had I&D at that time; he does not recall this in much detail.   He has a history of tuberous sclerosis.  He has never seen ENT in the past.  Review of Systems See above    Objective:   Physical Exam Generally  Well appearing, no apparent distress.  HEENT Sizeable 2x4cm fluctuant area of erythema, with pinpoint opening that expresses pus with gentle palpation during exam.  Neck supple. Surrounding skin changes including patches of erythema. Clear oropharynx.  No facial tenderness or maxillary sinus tenderness.        Assessment & Plan:   Procedure Note: Patient counseled on risks and benefits of local I&D, including risk of injury, bleeding and infection.  He gives verbal and written consent; at patient's request, discussed with his mother Tyler Alvarez [phone], she agrees with procedure plan.  Area prepped in sterile fashion.  Timeout taken. Area of abscess along L neck infiltrated with 2% lidocaine without epi.  11-blade used to open a 1cm straight incision across the most fluctuant area of the abscess, with expression of considerable purulent discharge.  Negligible EBL, easily controlled.  Tolerated well.

## 2012-04-08 ENCOUNTER — Telehealth: Payer: Self-pay | Admitting: Family Medicine

## 2012-04-08 NOTE — Telephone Encounter (Signed)
Called back  And mother states he is asleep. Had a bad night with abscess on neck.  She does not want to wake him up now and states she is the one that called. . States he took antibiotic Friday and Sat and yesterday only took one .  He felt that it caused blurry vision and dizziness. Will send message to Dr. Mauricio Po and  Asked mother  [place] patient call me back to discuss also.

## 2012-04-08 NOTE — Telephone Encounter (Signed)
Patient notified . States he is working on getting his PCP  on medicaid card changed .

## 2012-04-08 NOTE — Telephone Encounter (Signed)
The patient has multiple abx intolerances/allergies that make selection of an oral abx challenging.  He needs ENT surgical evaluation; it is my understanding that this cannot be arranged until he changes the PCP on his Medicaid card to our practice.   If he is having any worsening (ie, fevers/chills, intolerance of orals, etc) he should be taken to ED.  There is not much else we have to offer him in the [facility] in light of these limiltations.  Paula Compton, MD

## 2012-04-08 NOTE — Telephone Encounter (Signed)
States that the ABX from Friday gave him blurred vision and dizzyness - stopped taking it yesterday - not sure what to do now.  [facility] [place]

## 2012-05-20 ENCOUNTER — Telehealth: Payer: Self-pay | Admitting: *Deleted

## 2012-05-20 NOTE — Telephone Encounter (Signed)
Called pt.

## 2012-05-20 NOTE — Telephone Encounter (Signed)
Called pt. Re: cyst (referral). See notes. Lorenda Hatchet, Renato Battles

## 2013-01-30 ENCOUNTER — Ambulatory Visit: Payer: Medicaid Other

## 2013-02-06 ENCOUNTER — Ambulatory Visit (INDEPENDENT_AMBULATORY_CARE_PROVIDER_SITE_OTHER): Payer: Medicaid Other | Admitting: *Deleted

## 2013-02-06 DIAGNOSIS — Z23 Encounter for immunization: Secondary | ICD-10-CM

## 2013-10-20 DIAGNOSIS — R918 Other nonspecific abnormal finding of lung field: Secondary | ICD-10-CM | POA: Diagnosis not present

## 2013-10-20 DIAGNOSIS — R222 Localized swelling, mass and lump, trunk: Secondary | ICD-10-CM | POA: Diagnosis not present

## 2013-12-09 DIAGNOSIS — Z940 Kidney transplant status: Secondary | ICD-10-CM | POA: Diagnosis not present

## 2013-12-10 DIAGNOSIS — R918 Other nonspecific abnormal finding of lung field: Secondary | ICD-10-CM | POA: Diagnosis not present

## 2013-12-15 DIAGNOSIS — N2581 Secondary hyperparathyroidism of renal origin: Secondary | ICD-10-CM | POA: Diagnosis not present

## 2013-12-15 DIAGNOSIS — I10 Essential (primary) hypertension: Secondary | ICD-10-CM | POA: Diagnosis not present

## 2013-12-15 DIAGNOSIS — Z940 Kidney transplant status: Secondary | ICD-10-CM | POA: Diagnosis not present

## 2013-12-15 DIAGNOSIS — N183 Chronic kidney disease, stage 3 unspecified: Secondary | ICD-10-CM | POA: Diagnosis not present

## 2014-02-17 ENCOUNTER — Ambulatory Visit (INDEPENDENT_AMBULATORY_CARE_PROVIDER_SITE_OTHER): Payer: Medicare Other | Admitting: *Deleted

## 2014-02-17 DIAGNOSIS — Z23 Encounter for immunization: Secondary | ICD-10-CM

## 2014-03-05 DIAGNOSIS — Z940 Kidney transplant status: Secondary | ICD-10-CM | POA: Diagnosis not present

## 2014-03-16 DIAGNOSIS — N183 Chronic kidney disease, stage 3 (moderate): Secondary | ICD-10-CM | POA: Diagnosis not present

## 2014-03-16 DIAGNOSIS — N2581 Secondary hyperparathyroidism of renal origin: Secondary | ICD-10-CM | POA: Diagnosis not present

## 2014-03-16 DIAGNOSIS — T829XXA Unspecified complication of cardiac and vascular prosthetic device, implant and graft, initial encounter: Secondary | ICD-10-CM | POA: Diagnosis not present

## 2014-03-16 DIAGNOSIS — Z940 Kidney transplant status: Secondary | ICD-10-CM | POA: Diagnosis not present

## 2014-04-30 DIAGNOSIS — D492 Neoplasm of unspecified behavior of bone, soft tissue, and skin: Secondary | ICD-10-CM | POA: Diagnosis not present

## 2014-04-30 DIAGNOSIS — L720 Epidermal cyst: Secondary | ICD-10-CM | POA: Diagnosis not present

## 2014-04-30 DIAGNOSIS — Q851 Tuberous sclerosis: Secondary | ICD-10-CM | POA: Diagnosis not present

## 2014-05-04 ENCOUNTER — Other Ambulatory Visit (HOSPITAL_COMMUNITY): Payer: Medicaid Other

## 2014-05-04 ENCOUNTER — Ambulatory Visit: Payer: Medicaid Other | Admitting: Surgery

## 2014-05-19 DIAGNOSIS — Q851 Tuberous sclerosis: Secondary | ICD-10-CM | POA: Diagnosis not present

## 2014-05-19 DIAGNOSIS — L720 Epidermal cyst: Secondary | ICD-10-CM | POA: Diagnosis not present

## 2014-05-19 DIAGNOSIS — D492 Neoplasm of unspecified behavior of bone, soft tissue, and skin: Secondary | ICD-10-CM | POA: Diagnosis not present

## 2014-06-04 ENCOUNTER — Encounter: Payer: Self-pay | Admitting: Surgery

## 2014-06-08 ENCOUNTER — Ambulatory Visit: Payer: Medicaid Other | Admitting: Surgery

## 2014-06-19 ENCOUNTER — Encounter: Payer: Self-pay | Admitting: Surgery

## 2014-06-22 ENCOUNTER — Ambulatory Visit (INDEPENDENT_AMBULATORY_CARE_PROVIDER_SITE_OTHER): Payer: 59 | Admitting: Surgery

## 2014-06-22 ENCOUNTER — Encounter: Payer: Self-pay | Admitting: Surgery

## 2014-06-22 VITALS — BP 134/85 | HR 92 | Ht 71.0 in | Wt 141.8 lb

## 2014-06-22 DIAGNOSIS — I770 Arteriovenous fistula, acquired: Secondary | ICD-10-CM

## 2014-06-22 NOTE — Progress Notes (Signed)
HISTORY AND PHYSICAL     CC:  Possible ligation of left arm AVF Referring Provider:  Hilton Sinclair, *  HPI: This is a 39 y.o. 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  He had a left radiocephalic AVF placed, which was not successful.  He subsequently underwent a left brachiocephalic AVF in 2353/6144.  The pt did receive a kidney transplantn 2001 and has been off HD since that time.  He states that he has a left neck mass that is being followed by ENT, which they state is a result of his tuberous sclerosis.    He states that he did have seizures as a child and his last seizure was around the age of 1.  He continues to take medications for this.  He takes medication for restless leg syndrome.  He has ADD.  He is referred here for his enlarging left arm AVF for ligation.  Past Medical History  Diagnosis Date  . Hypertension   . Chronic kidney disease     s/p transplant  . Seizures     treated by Dr. Melrose Nakayama  . ADD (attention deficit disorder)   . Tuberous sclerosis   . Immune deficiency disorder     Past Surgical History  Procedure Laterality Date  . Kidney transplant    . Av fistula placement      Allergies  Allergen Reactions  . Amoxicillin   . Erythromycin   . Sulfa Antibiotics     Current Outpatient Prescriptions  Medication Sig Dispense Refill  . amLODipine (NORVASC) 5 MG tablet Take 5 mg by mouth daily.     Marland Kitchen aspirin 81 MG chewable tablet Chew 81 mg by mouth daily.    . divalproex (DEPAKOTE) 250 MG EC tablet Take 500 mg by mouth every evening.     . gabapentin (NEURONTIN) 300 MG capsule Take 300 mg by mouth daily.     Marland Kitchen imipramine (TOFRANIL) 25 MG tablet Take 50 mg by mouth at bedtime.     Marland Kitchen omeprazole (PRILOSEC) 20 MG capsule Take 20 mg by mouth daily.      . predniSONE (DELTASONE) 5 MG tablet Take 5 mg by mouth 2 (two) times daily.     . tacrolimus (PROGRAF) 1 MG capsule Take 1 mg by  mouth 2 (two) times daily.      Marland Kitchen aspirin 81 MG tablet Take 81 mg by mouth daily.     Marland Kitchen doxycycline (VIBRA-TABS) 100 MG tablet Take 1 tablet (100 mg total) by mouth 2 (two) times daily. (Patient not taking: Reported on 06/22/2014) 20 tablet 0   No current facility-administered medications for this visit.    History reviewed. No pertinent family history.  History   Social History  . Marital Status: Single    Spouse Name: N/A    Number of Children: N/A  . Years of Education: N/A   Occupational History  . Not on file.   Social History Main Topics  . Smoking status: Never Smoker   . Smokeless tobacco: Not on file  . Alcohol Use: No  . Drug Use: No  . Sexual Activity: Yes   Other Topics Concern  . Not on file   Social History Narrative     ROS: [x]  Positive   [ ]  Negative   [ ]  All sytems reviewed and are negative  Cardiovascular: []  chest pain/pressure []  palpitations []  SOB lying flat []  DOE []   pain in legs while walking []  pain in feet when lying flat []  hx of DVT []  hx of phlebitis []  swelling in legs []  varicose veins  Pulmonary: []  productive cough []  asthma []  wheezing  Neurologic: []  weakness in []  arms []  legs []  numbness in []  arms []  legs [] difficulty speaking or slurred speech []  temporary loss of vision in one eye []  dizziness  Hematologic: []  bleeding problems []  problems with blood clotting easily  GI []  vomiting blood []  blood in stool  GU: []  burning with urination []  blood in urine [x]  hx kidney transplant   Psychiatric: []  hx of major depression  Integumentary: []  rashes []  ulcers  Constitutional: []  fever []  chills   PHYSICAL EXAMINATION:  Filed Vitals:   06/22/14 1543  BP: 134/85  Pulse: 92   Body mass index is 19.79 kg/(m^2).  General:  WDWN in NAD Gait: Not observed HENT: WNL, normocephalic Pulmonary: normal non-labored breathing , without Rales, rhonchi,  wheezing Cardiac: RRR, without  Murmurs, rubs or  gallops; without carotid bruits Abdomen: soft, NT, no masses Skin: without rashes, without ulcers  Vascular Exam/Pulses:  Right Left  Radial 2+ (normal) 2+ (normal)   Extremities: without ischemic changes, without Gangrene , without cellulitis; without open wounds; aneurysmal left arm AVF; AVF is pulsatile Musculoskeletal: no muscle wasting or atrophy  Neurologic: A&O X 3; Appropriate Affect ; SENSATION: normal; MOTOR FUNCTION:  moving all extremities equally. Speech is fluent/normal   Non-Invasive Vascular Imaging:   none  Pt meds includes: Statin:  No. Beta Blocker:  No. Aspirin:  Yes.   ACEI:  No. ARB:  No. Other Antiplatelet/Anticoagulant:  No.    ASSESSMENT/PLAN:: 39 y.o. male who has hx of ESRD now with functioning kidney after transplant with aneurysmal left arm AVF   -will schedule pt for excision of aneurysmal portion of left arm AVF.  This will be scheduled as a 23 hour observation for hydration for his renal function. -surgery will be scheduled for July 16, 2014 -[facility] to send progress notes regarding this pt per Zorita Pang, PA-C [facility] [phone]  Clinic MD:  Pt seen and examined in conjunction with Dr. Trula Slade  I agree with the above.  I have seen and evaluated the patient.  He has a large aneurysmal left upper arm fistula which is causing him concern and unnecessary precautions to be taken so as to avoid injury.  He would like to have this ligated.  He has a functioning renal transplant.  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  I discussed with the patient that I would prefer not to take an incision and take the entire fistula out.  I feel there is a good division beyond the first aneurysm. I discussed  Taking down the arch oh venous anastomosis and then resecting the fistula up to the first aneurysm which is approximately 7 cm above the antecubital crease.  I did discuss that he may be at risk for thrombophlebitis from the  thrombosed fistula after it is ligated and he may require ligation of more proximal portions of the fistula if they do not resolve.  This operation has been scheduled for March 3.  Because of the patient's renal transplant he would like to be kept overnight for observation and hydration.  [name]

## 2014-06-25 ENCOUNTER — Other Ambulatory Visit: Payer: Self-pay

## 2014-07-13 DIAGNOSIS — Z940 Kidney transplant status: Secondary | ICD-10-CM | POA: Diagnosis not present

## 2014-07-13 DIAGNOSIS — I10 Essential (primary) hypertension: Secondary | ICD-10-CM | POA: Diagnosis not present

## 2014-07-13 DIAGNOSIS — N183 Chronic kidney disease, stage 3 (moderate): Secondary | ICD-10-CM | POA: Diagnosis not present

## 2014-07-13 DIAGNOSIS — N2581 Secondary hyperparathyroidism of renal origin: Secondary | ICD-10-CM | POA: Diagnosis not present

## 2014-07-15 ENCOUNTER — Encounter (HOSPITAL_COMMUNITY): Payer: Self-pay | Admitting: *Deleted

## 2014-07-15 MED ORDER — SODIUM CHLORIDE 0.9 % IV SOLN
INTRAVENOUS | Status: DC
  Administered 2014-07-16: 13:00:00 via INTRAVENOUS
  Administered 2014-07-16: 10 mL/h via INTRAVENOUS

## 2014-07-15 MED ORDER — VANCOMYCIN HCL IN DEXTROSE 1-5 GM/200ML-% IV SOLN
1000.0000 mg | INTRAVENOUS | Status: AC
  Administered 2014-07-16: 1000 mg via INTRAVENOUS
  Filled 2014-07-15: qty 200

## 2014-07-15 NOTE — Progress Notes (Signed)
Pt SDW--pre-op call completed by by pt mother, Wilburn Cornelia, as requested by pt. Mother denies that pt has SOB, chest pain and is under the care of a cardiologist. Pt  mother denies that pt has had an EKG w/i the last year. Pt mother denies that the pt has had a cardiac cath and stress test. Pt mother made aware to stop NSAID's, otc vitamins herbal medications and Dimetapp. According to pt mother, pt doesn't  not take any AM medications; he takes his medicine at noon and midnight. Pt mother verbalized understanding of all pre-op instructions.

## 2014-07-16 ENCOUNTER — Inpatient Hospital Stay (HOSPITAL_COMMUNITY)
Admission: RE | Admit: 2014-07-16 | Discharge: 2014-07-17 | DRG: 253 | Disposition: A | Discharge status: Home / Self Care | Payer: Medicare Other | Source: Ambulatory Visit | Attending: Surgery | Admitting: Surgery

## 2014-07-16 ENCOUNTER — Ambulatory Visit (HOSPITAL_COMMUNITY): Payer: Medicare Other | Admitting: Anesthesiology

## 2014-07-16 ENCOUNTER — Encounter (HOSPITAL_COMMUNITY)
Admission: RE | Disposition: A | Discharge status: Home / Self Care | Payer: Self-pay | Source: Ambulatory Visit | Attending: Surgery

## 2014-07-16 DIAGNOSIS — Y832 Surgical operation with anastomosis, bypass or graft as the cause of abnormal reaction of the patient, or of later complication, without mention of misadventure at the time of the procedure: Secondary | ICD-10-CM | POA: Diagnosis present

## 2014-07-16 DIAGNOSIS — Z940 Kidney transplant status: Secondary | ICD-10-CM

## 2014-07-16 DIAGNOSIS — D849 Immunodeficiency, unspecified: Secondary | ICD-10-CM | POA: Diagnosis present

## 2014-07-16 DIAGNOSIS — Z79899 Other long term (current) drug therapy: Secondary | ICD-10-CM | POA: Diagnosis not present

## 2014-07-16 DIAGNOSIS — R569 Unspecified convulsions: Secondary | ICD-10-CM | POA: Diagnosis present

## 2014-07-16 DIAGNOSIS — Z880 Allergy status to penicillin: Secondary | ICD-10-CM | POA: Diagnosis not present

## 2014-07-16 DIAGNOSIS — Z882 Allergy status to sulfonamides status: Secondary | ICD-10-CM | POA: Diagnosis not present

## 2014-07-16 DIAGNOSIS — T814XXA Infection following a procedure, initial encounter: Secondary | ICD-10-CM | POA: Diagnosis not present

## 2014-07-16 DIAGNOSIS — G2581 Restless legs syndrome: Secondary | ICD-10-CM | POA: Diagnosis present

## 2014-07-16 DIAGNOSIS — I10 Essential (primary) hypertension: Secondary | ICD-10-CM | POA: Diagnosis not present

## 2014-07-16 DIAGNOSIS — F988 Other specified behavioral and emotional disorders with onset usually occurring in childhood and adolescence: Secondary | ICD-10-CM | POA: Diagnosis present

## 2014-07-16 DIAGNOSIS — T82898A Other specified complication of vascular prosthetic devices, implants and grafts, initial encounter: Secondary | ICD-10-CM | POA: Diagnosis present

## 2014-07-16 DIAGNOSIS — D649 Anemia, unspecified: Secondary | ICD-10-CM | POA: Diagnosis not present

## 2014-07-16 DIAGNOSIS — N189 Chronic kidney disease, unspecified: Secondary | ICD-10-CM | POA: Diagnosis present

## 2014-07-16 DIAGNOSIS — Z7982 Long term (current) use of aspirin: Secondary | ICD-10-CM | POA: Diagnosis not present

## 2014-07-16 DIAGNOSIS — T814XXD Infection following a procedure, subsequent encounter: Secondary | ICD-10-CM | POA: Diagnosis not present

## 2014-07-16 DIAGNOSIS — Q851 Tuberous sclerosis: Secondary | ICD-10-CM | POA: Diagnosis not present

## 2014-07-16 DIAGNOSIS — I729 Aneurysm of unspecified site: Secondary | ICD-10-CM | POA: Diagnosis present

## 2014-07-16 DIAGNOSIS — Z881 Allergy status to other antibiotic agents status: Secondary | ICD-10-CM

## 2014-07-16 DIAGNOSIS — I129 Hypertensive chronic kidney disease with stage 1 through stage 4 chronic kidney disease, or unspecified chronic kidney disease: Secondary | ICD-10-CM | POA: Diagnosis present

## 2014-07-16 DIAGNOSIS — T8172XD Complication of vein following a procedure, not elsewhere classified, subsequent encounter: Secondary | ICD-10-CM | POA: Diagnosis not present

## 2014-07-16 DIAGNOSIS — I721 Aneurysm of artery of upper extremity: Principal | ICD-10-CM | POA: Diagnosis present

## 2014-07-16 DIAGNOSIS — L03114 Cellulitis of left upper limb: Secondary | ICD-10-CM | POA: Diagnosis not present

## 2014-07-16 HISTORY — DX: Gastro-esophageal reflux disease without esophagitis: K21.9

## 2014-07-16 HISTORY — DX: Headache, unspecified: R51.9

## 2014-07-16 HISTORY — DX: Headache: R51

## 2014-07-16 HISTORY — PX: REVISON OF ARTERIOVENOUS FISTULA: SHX6074

## 2014-07-16 LAB — COMPREHENSIVE METABOLIC PANEL
ALT: 15 U/L (ref 0–53)
ANION GAP: 5 (ref 5–15)
AST: 16 U/L (ref 0–37)
Albumin: 3.4 g/dL — ABNORMAL LOW (ref 3.5–5.2)
Alkaline Phosphatase: 45 U/L (ref 39–117)
BUN: 21 mg/dL (ref 6–23)
CALCIUM: 8.8 mg/dL (ref 8.4–10.5)
CO2: 26 mmol/L (ref 19–32)
Chloride: 106 mmol/L (ref 96–112)
Creatinine, Ser: 1.61 mg/dL — ABNORMAL HIGH (ref 0.50–1.35)
GFR calc Af Amer: 60 mL/min — ABNORMAL LOW (ref >90)
GFR, EST NON AFRICAN AMERICAN: 52 mL/min — AB (ref >90)
GLUCOSE: 102 mg/dL — AB (ref 70–99)
Potassium: 3.9 mmol/L (ref 3.5–5.1)
Sodium: 137 mmol/L (ref 135–145)
TOTAL PROTEIN: 6 g/dL (ref 6.0–8.3)
Total Bilirubin: 0.7 mg/dL (ref 0.3–1.2)

## 2014-07-16 LAB — URINALYSIS, ROUTINE W REFLEX MICROSCOPIC
Bilirubin Urine: NEGATIVE
Glucose, UA: NEGATIVE mg/dL
Hgb urine dipstick: NEGATIVE
Ketones, ur: NEGATIVE mg/dL
Leukocytes, UA: NEGATIVE
NITRITE: NEGATIVE
PH: 6 (ref 5.0–8.0)
Protein, ur: NEGATIVE mg/dL
SPECIFIC GRAVITY, URINE: 1.013 (ref 1.005–1.030)
UROBILINOGEN UA: 0.2 mg/dL (ref 0.0–1.0)

## 2014-07-16 LAB — POCT I-STAT 4, (NA,K, GLUC, HGB,HCT)
GLUCOSE: 117 mg/dL — AB (ref 70–99)
HEMATOCRIT: 38 % — AB (ref 39.0–52.0)
HEMOGLOBIN: 12.9 g/dL — AB (ref 13.0–17.0)
Potassium: 4.4 mmol/L (ref 3.5–5.1)
Sodium: 140 mmol/L (ref 135–145)

## 2014-07-16 LAB — PROTIME-INR
INR: 1.09 (ref 0.00–1.49)
PROTHROMBIN TIME: 14.2 s (ref 11.6–15.2)

## 2014-07-16 SURGERY — REVISON OF ARTERIOVENOUS FISTULA
Anesthesia: General | Site: Arm Upper | Laterality: Left

## 2014-07-16 MED ORDER — LIDOCAINE HCL (CARDIAC) 20 MG/ML IV SOLN
INTRAVENOUS | Status: DC | PRN
  Administered 2014-07-16: 50 mg via INTRAVENOUS

## 2014-07-16 MED ORDER — FENTANYL CITRATE 0.05 MG/ML IJ SOLN
INTRAMUSCULAR | Status: AC
  Filled 2014-07-16: qty 5

## 2014-07-16 MED ORDER — PREDNISONE 5 MG PO TABS
5.0000 mg | ORAL_TABLET | Freq: Two times a day (BID) | ORAL | Status: DC
  Administered 2014-07-16 – 2014-07-17 (×2): 5 mg via ORAL
  Filled 2014-07-16 (×3): qty 1

## 2014-07-16 MED ORDER — ACETAMINOPHEN 325 MG PO TABS
650.0000 mg | ORAL_TABLET | Freq: Four times a day (QID) | ORAL | Status: DC | PRN
  Administered 2014-07-16 – 2014-07-17 (×2): 650 mg via ORAL
  Filled 2014-07-16 (×2): qty 2

## 2014-07-16 MED ORDER — IMIPRAMINE HCL 50 MG PO TABS
50.0000 mg | ORAL_TABLET | Freq: Every day | ORAL | Status: DC
  Administered 2014-07-16: 50 mg via ORAL
  Filled 2014-07-16 (×2): qty 1

## 2014-07-16 MED ORDER — PROPOFOL 10 MG/ML IV BOLUS
INTRAVENOUS | Status: DC | PRN
  Administered 2014-07-16: 150 mg via INTRAVENOUS

## 2014-07-16 MED ORDER — FENTANYL CITRATE 0.05 MG/ML IJ SOLN
INTRAMUSCULAR | Status: DC | PRN
  Administered 2014-07-16: 100 ug via INTRAVENOUS

## 2014-07-16 MED ORDER — HYDRALAZINE HCL 20 MG/ML IJ SOLN: 5.0000 mg | INTRAMUSCULAR | Status: DC | PRN

## 2014-07-16 MED ORDER — DIVALPROEX SODIUM 250 MG PO DR TAB
500.0000 mg | DELAYED_RELEASE_TABLET | Freq: Every evening | ORAL | Status: DC
  Administered 2014-07-16: 500 mg via ORAL
  Filled 2014-07-16 (×2): qty 2

## 2014-07-16 MED ORDER — CHLORHEXIDINE GLUCONATE CLOTH 2 % EX PADS: 6.0000 | MEDICATED_PAD | Freq: Once | CUTANEOUS | Status: DC

## 2014-07-16 MED ORDER — ENOXAPARIN SODIUM 40 MG/0.4ML ~~LOC~~ SOLN
40.0000 mg | SUBCUTANEOUS | Status: DC
  Administered 2014-07-17: 40 mg via SUBCUTANEOUS
  Filled 2014-07-16 (×2): qty 0.4

## 2014-07-16 MED ORDER — BROMPHENIRAMINE-PSEUDOEPH 1-15 MG/5ML PO ELIX: 5.0000 mL | ORAL_SOLUTION | Freq: Two times a day (BID) | ORAL | Status: DC | PRN

## 2014-07-16 MED ORDER — 0.9 % SODIUM CHLORIDE (POUR BTL) OPTIME
TOPICAL | Status: DC | PRN
  Administered 2014-07-16: 1000 mL

## 2014-07-16 MED ORDER — DEXTROSE 5 % IV SOLN
1.5000 g | Freq: Two times a day (BID) | INTRAVENOUS | Status: AC
  Administered 2014-07-16 – 2014-07-17 (×2): 1.5 g via INTRAVENOUS
  Filled 2014-07-16 (×2): qty 1.5

## 2014-07-16 MED ORDER — TACROLIMUS 1 MG PO CAPS
1.0000 mg | ORAL_CAPSULE | Freq: Two times a day (BID) | ORAL | Status: DC
  Administered 2014-07-16 – 2014-07-17 (×2): 1 mg via ORAL
  Filled 2014-07-16 (×3): qty 1

## 2014-07-16 MED ORDER — LIDOCAINE-EPINEPHRINE (PF) 1 %-1:200000 IJ SOLN
INTRAMUSCULAR | Status: DC | PRN
  Administered 2014-07-16: 9 mL

## 2014-07-16 MED ORDER — SODIUM CHLORIDE 0.9 % IV SOLN
INTRAVENOUS | Status: DC
  Administered 2014-07-16 – 2014-07-17 (×2): via INTRAVENOUS

## 2014-07-16 MED ORDER — POTASSIUM CHLORIDE CRYS ER 20 MEQ PO TBCR: 20.0000 meq | EXTENDED_RELEASE_TABLET | Freq: Once | ORAL | Status: DC

## 2014-07-16 MED ORDER — HYDROMORPHONE HCL 1 MG/ML IJ SOLN: 0.2500 mg | INTRAMUSCULAR | Status: DC | PRN

## 2014-07-16 MED ORDER — AMLODIPINE BESYLATE 5 MG PO TABS
5.0000 mg | ORAL_TABLET | Freq: Every day | ORAL | Status: DC
  Administered 2014-07-16 – 2014-07-17 (×2): 5 mg via ORAL
  Filled 2014-07-16 (×2): qty 1

## 2014-07-16 MED ORDER — ASPIRIN 81 MG PO CHEW
81.0000 mg | CHEWABLE_TABLET | Freq: Every day | ORAL | Status: DC
  Administered 2014-07-16 – 2014-07-17 (×2): 81 mg via ORAL
  Filled 2014-07-16 (×2): qty 1

## 2014-07-16 MED ORDER — GUAIFENESIN-DM 100-10 MG/5ML PO SYRP: 15.0000 mL | ORAL_SOLUTION | ORAL | Status: DC | PRN

## 2014-07-16 MED ORDER — LIDOCAINE-EPINEPHRINE (PF) 1 %-1:200000 IJ SOLN
INTRAMUSCULAR | Status: AC
  Filled 2014-07-16: qty 10

## 2014-07-16 MED ORDER — SODIUM CHLORIDE 0.9 % IR SOLN
Status: DC | PRN
  Administered 2014-07-16: 500 mL

## 2014-07-16 MED ORDER — DIVALPROEX SODIUM 500 MG PO DR TAB
500.0000 mg | DELAYED_RELEASE_TABLET | Freq: Every evening | ORAL | Status: DC
  Filled 2014-07-16: qty 1

## 2014-07-16 MED ORDER — METOPROLOL TARTRATE 1 MG/ML IV SOLN
2.0000 mg | INTRAVENOUS | Status: DC | PRN
Start: 2014-07-16 — End: 2014-07-17

## 2014-07-16 MED ORDER — DEXTROSE 5 % IV SOLN: 1.5000 g | Freq: Two times a day (BID) | INTRAVENOUS | Status: DC

## 2014-07-16 MED ORDER — GABAPENTIN 300 MG PO CAPS
300.0000 mg | ORAL_CAPSULE | Freq: Every day | ORAL | Status: DC
  Administered 2014-07-16 – 2014-07-17 (×2): 300 mg via ORAL
  Filled 2014-07-16 (×2): qty 1

## 2014-07-16 MED ORDER — PHENOL 1.4 % MT LIQD
1.0000 | OROMUCOSAL | Status: DC | PRN
  Filled 2014-07-16: qty 177

## 2014-07-16 MED ORDER — MIDAZOLAM HCL 2 MG/2ML IJ SOLN
INTRAMUSCULAR | Status: AC
  Filled 2014-07-16: qty 2

## 2014-07-16 MED ORDER — PANTOPRAZOLE SODIUM 40 MG PO TBEC
40.0000 mg | DELAYED_RELEASE_TABLET | Freq: Every day | ORAL | Status: DC
  Administered 2014-07-16 – 2014-07-17 (×2): 40 mg via ORAL
  Filled 2014-07-16 (×2): qty 1

## 2014-07-16 MED ORDER — MIDAZOLAM HCL 5 MG/5ML IJ SOLN
INTRAMUSCULAR | Status: DC | PRN
  Administered 2014-07-16: 2 mg via INTRAVENOUS

## 2014-07-16 MED ORDER — HEMOSTATIC AGENTS (NO CHARGE) OPTIME
TOPICAL | Status: DC | PRN
  Administered 2014-07-16: 1 via TOPICAL

## 2014-07-16 MED ORDER — DIVALPROEX SODIUM 250 MG PO DR TAB
500.0000 mg | DELAYED_RELEASE_TABLET | Freq: Every evening | ORAL | Status: DC
  Filled 2014-07-16: qty 2

## 2014-07-16 MED ORDER — HEPARIN SODIUM (PORCINE) 1000 UNIT/ML IJ SOLN
INTRAMUSCULAR | Status: AC
  Filled 2014-07-16: qty 1

## 2014-07-16 MED ORDER — PHENYLEPHRINE HCL 10 MG/ML IJ SOLN
INTRAMUSCULAR | Status: DC | PRN
  Administered 2014-07-16 (×3): 80 ug via INTRAVENOUS

## 2014-07-16 MED ORDER — ALUM & MAG HYDROXIDE-SIMETH 200-200-20 MG/5ML PO SUSP
15.0000 mL | ORAL | Status: DC | PRN
Start: 2014-07-16 — End: 2014-07-17

## 2014-07-16 MED ORDER — ONDANSETRON HCL 4 MG/2ML IJ SOLN
4.0000 mg | Freq: Four times a day (QID) | INTRAMUSCULAR | Status: DC | PRN
Start: 2014-07-16 — End: 2014-07-17

## 2014-07-16 SURGICAL SUPPLY — 41 items
BANDAGE ELASTIC 6 VELCRO ST LF (GAUZE/BANDAGES/DRESSINGS) ×2 IMPLANT
BLADE SURG 10 STRL SS (BLADE) IMPLANT
BNDG GAUZE ELAST 4 BULKY (GAUZE/BANDAGES/DRESSINGS) ×2 IMPLANT
CANISTER SUCTION 2500CC (MISCELLANEOUS) ×2 IMPLANT
CLIP TI MEDIUM 6 (CLIP) ×2 IMPLANT
CLIP TI WIDE RED SMALL 6 (CLIP) ×2 IMPLANT
COVER PROBE W GEL 5X96 (DRAPES) IMPLANT
COVER SURGICAL LIGHT HANDLE (MISCELLANEOUS) IMPLANT
ELECT REM PT RETURN 9FT ADLT (ELECTROSURGICAL) ×2
ELECTRODE REM PT RTRN 9FT ADLT (ELECTROSURGICAL) ×1 IMPLANT
GLOVE BIO SURGEON STRL SZ 6.5 (GLOVE) ×2 IMPLANT
GLOVE BIOGEL PI IND STRL 6.5 (GLOVE) ×1 IMPLANT
GLOVE BIOGEL PI IND STRL 7.0 (GLOVE) ×1 IMPLANT
GLOVE BIOGEL PI IND STRL 7.5 (GLOVE) ×1 IMPLANT
GLOVE BIOGEL PI INDICATOR 6.5 (GLOVE) ×1
GLOVE BIOGEL PI INDICATOR 7.0 (GLOVE) ×1
GLOVE BIOGEL PI INDICATOR 7.5 (GLOVE) ×1
GLOVE SURG SS PI 7.0 STRL IVOR (GLOVE) ×2 IMPLANT
GLOVE SURG SS PI 7.5 STRL IVOR (GLOVE) ×2 IMPLANT
GOWN STRL REUS W/ TWL LRG LVL3 (GOWN DISPOSABLE) ×3 IMPLANT
GOWN STRL REUS W/ TWL XL LVL3 (GOWN DISPOSABLE) ×1 IMPLANT
GOWN STRL REUS W/TWL LRG LVL3 (GOWN DISPOSABLE) ×6
GOWN STRL REUS W/TWL XL LVL3 (GOWN DISPOSABLE) ×2
HEMOSTAT SNOW SURGICEL 2X4 (HEMOSTASIS) ×2 IMPLANT
KIT BASIN OR (CUSTOM PROCEDURE TRAY) ×2 IMPLANT
KIT ROOM TURNOVER OR (KITS) ×2 IMPLANT
LIQUID BAND (GAUZE/BANDAGES/DRESSINGS) ×2 IMPLANT
NS IRRIG 1000ML POUR BTL (IV SOLUTION) ×2 IMPLANT
PACK CV ACCESS (CUSTOM PROCEDURE TRAY) ×2 IMPLANT
PAD ARMBOARD 7.5X6 YLW CONV (MISCELLANEOUS) ×4 IMPLANT
PROBE PENCIL 8 MHZ STRL DISP (MISCELLANEOUS) IMPLANT
SUT PROLENE 5 0 C 1 24 (SUTURE) ×4 IMPLANT
SUT PROLENE 6 0 CC (SUTURE) ×2 IMPLANT
SUT VIC AB 2-0 CT1 27 (SUTURE) ×2
SUT VIC AB 2-0 CT1 TAPERPNT 27 (SUTURE) ×1 IMPLANT
SUT VIC AB 3-0 SH 27 (SUTURE) ×2
SUT VIC AB 3-0 SH 27X BRD (SUTURE) ×2 IMPLANT
SUT VICRYL 4-0 PS2 18IN ABS (SUTURE) ×4 IMPLANT
SYR 30ML LL (SYRINGE) ×2 IMPLANT
UNDERPAD 30X30 INCONTINENT (UNDERPADS AND DIAPERS) ×2 IMPLANT
WATER STERILE IRR 1000ML POUR (IV SOLUTION) ×2 IMPLANT

## 2014-07-16 NOTE — H&P (View-Only) (Signed)
HISTORY AND PHYSICAL     CC:  Possible ligation of left arm AVF Referring Provider:  Hilton Sinclair, *  HPI: This is a 39 y.o. 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  He had a left radiocephalic AVF placed, which was not successful.  He subsequently underwent a left brachiocephalic AVF in 2706/2376.  The pt did receive a kidney transplantn 2001 and has been off HD since that time.  He states that he has a left neck mass that is being followed by ENT, which they state is a result of his tuberous sclerosis.    He states that he did have seizures as a child and his last seizure was around the age of 4.  He continues to take medications for this.  He takes medication for restless leg syndrome.  He has ADD.  He is referred here for his enlarging left arm AVF for ligation.  Past Medical History  Diagnosis Date  . Hypertension   . Chronic kidney disease     s/p transplant  . Seizures     treated by Dr. Melrose Nakayama  . ADD (attention deficit disorder)   . Tuberous sclerosis   . Immune deficiency disorder     Past Surgical History  Procedure Laterality Date  . Kidney transplant    . Av fistula placement      Allergies  Allergen Reactions  . Amoxicillin   . Erythromycin   . Sulfa Antibiotics     Current Outpatient Prescriptions  Medication Sig Dispense Refill  . amLODipine (NORVASC) 5 MG tablet Take 5 mg by mouth daily.     Marland Kitchen aspirin 81 MG chewable tablet Chew 81 mg by mouth daily.    . divalproex (DEPAKOTE) 250 MG EC tablet Take 500 mg by mouth every evening.     . gabapentin (NEURONTIN) 300 MG capsule Take 300 mg by mouth daily.     Marland Kitchen imipramine (TOFRANIL) 25 MG tablet Take 50 mg by mouth at bedtime.     Marland Kitchen omeprazole (PRILOSEC) 20 MG capsule Take 20 mg by mouth daily.      . predniSONE (DELTASONE) 5 MG tablet Take 5 mg by mouth 2 (two) times daily.     . tacrolimus (PROGRAF) 1 MG capsule Take 1 mg by  mouth 2 (two) times daily.      Marland Kitchen aspirin 81 MG tablet Take 81 mg by mouth daily.     Marland Kitchen doxycycline (VIBRA-TABS) 100 MG tablet Take 1 tablet (100 mg total) by mouth 2 (two) times daily. (Patient not taking: Reported on 06/22/2014) 20 tablet 0   No current facility-administered medications for this visit.    History reviewed. No pertinent family history.  History   Social History  . Marital Status: Single    Spouse Name: N/A    Number of Children: N/A  . Years of Education: N/A   Occupational History  . Not on file.   Social History Main Topics  . Smoking status: Never Smoker   . Smokeless tobacco: Not on file  . Alcohol Use: No  . Drug Use: No  . Sexual Activity: Yes   Other Topics Concern  . Not on file   Social History Narrative     ROS: [x]  Positive   [ ]  Negative   [ ]  All sytems reviewed and are negative  Cardiovascular: []  chest pain/pressure []  palpitations []  SOB lying flat []  DOE []   pain in legs while walking []  pain in feet when lying flat []  hx of DVT []  hx of phlebitis []  swelling in legs []  varicose veins  Pulmonary: []  productive cough []  asthma []  wheezing  Neurologic: []  weakness in []  arms []  legs []  numbness in []  arms []  legs [] difficulty speaking or slurred speech []  temporary loss of vision in one eye []  dizziness  Hematologic: []  bleeding problems []  problems with blood clotting easily  GI []  vomiting blood []  blood in stool  GU: []  burning with urination []  blood in urine [x]  hx kidney transplant   Psychiatric: []  hx of major depression  Integumentary: []  rashes []  ulcers  Constitutional: []  fever []  chills   PHYSICAL EXAMINATION:  Filed Vitals:   06/22/14 1543  BP: 134/85  Pulse: 92   Body mass index is 19.79 kg/(m^2).  General:  WDWN in NAD Gait: Not observed HENT: WNL, normocephalic Pulmonary: normal non-labored breathing , without Rales, rhonchi,  wheezing Cardiac: RRR, without  Murmurs, rubs or  gallops; without carotid bruits Abdomen: soft, NT, no masses Skin: without rashes, without ulcers  Vascular Exam/Pulses:  Right Left  Radial 2+ (normal) 2+ (normal)   Extremities: without ischemic changes, without Gangrene , without cellulitis; without open wounds; aneurysmal left arm AVF; AVF is pulsatile Musculoskeletal: no muscle wasting or atrophy  Neurologic: A&O X 3; Appropriate Affect ; SENSATION: normal; MOTOR FUNCTION:  moving all extremities equally. Speech is fluent/normal   Non-Invasive Vascular Imaging:   none  Pt meds includes: Statin:  No. Beta Blocker:  No. Aspirin:  Yes.   ACEI:  No. ARB:  No. Other Antiplatelet/Anticoagulant:  No.    ASSESSMENT/PLAN:: 39 y.o. male who has hx of ESRD now with functioning kidney after transplant with aneurysmal left arm AVF   -will schedule pt for excision of aneurysmal portion of left arm AVF.  This will be scheduled as a 23 hour observation for hydration for his renal function. -surgery will be scheduled for July 16, 2014 -[facility] to send progress notes regarding this pt per Zorita Pang, PA-C [facility] [phone]  Clinic MD:  Pt seen and examined in conjunction with Dr. Trula Slade  I agree with the above.  I have seen and evaluated the patient.  He has a large aneurysmal left upper arm fistula which is causing him concern and unnecessary precautions to be taken so as to avoid injury.  He would like to have this ligated.  He has a functioning renal transplant.  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  I discussed with the patient that I would prefer not to take an incision and take the entire fistula out.  I feel there is a good division beyond the first aneurysm. I discussed  Taking down the arch oh venous anastomosis and then resecting the fistula up to the first aneurysm which is approximately 7 cm above the antecubital crease.  I did discuss that he may be at risk for thrombophlebitis from the  thrombosed fistula after it is ligated and he may require ligation of more proximal portions of the fistula if they do not resolve.  This operation has been scheduled for March 3.  Because of the patient's renal transplant he would like to be kept overnight for observation and hydration.  [name]

## 2014-07-16 NOTE — Progress Notes (Signed)
Lab unable to get enough blood for all lab tests ordered from the right arm. They requested permission to use the patient's feet. Dr. Scot Dock called and stated to discontinue the labs that the phlebotomist could not collect.

## 2014-07-16 NOTE — Discharge Instructions (Signed)
What to eat: ° °For your first meals, you should eat lightly; only small meals initially.  If you do not have nausea, you may eat larger meals.  Avoid spicy, greasy and heavy food.   ° °General Anesthesia, Adult, Care After  °Refer to this sheet in the next few weeks. These instructions provide you with information on caring for yourself after your procedure. Your health care provider may also give you more specific instructions. Your treatment has been planned according to current medical practices, but problems sometimes occur. Call your health care provider if you have any problems or questions after your procedure.  °WHAT TO EXPECT AFTER THE PROCEDURE  °After the procedure, it is typical to experience:  °Sleepiness.  °Nausea and vomiting. °HOME CARE INSTRUCTIONS  °For the first 24 hours after general anesthesia:  °Have a responsible person with you.  °Do not drive a car. If you are alone, do not take public transportation.  °Do not drink alcohol.  °Do not take medicine that has not been prescribed by your health care provider.  °Do not sign important papers or make important decisions.  °You may resume a normal diet and activities as directed by your health care provider.  °Change bandages (dressings) as directed.  °If you have questions or problems that seem related to general anesthesia, call the hospital and ask for the anesthetist or anesthesiologist on call. °SEEK MEDICAL CARE IF:  °You have nausea and vomiting that continue the day after anesthesia.  °You develop a rash. °SEEK IMMEDIATE MEDICAL CARE IF:  °You have difficulty breathing.  °You have chest pain.  °You have any allergic problems. °Document Released: 08/07/2000 Document Revised: 01/01/2013 Document Reviewed: 11/14/2012  °ExitCare® Patient Information ©2014 [employer].  ° °Sore Throat  ° ° °A sore throat is a painful, burning, sore, or scratchy feeling of the throat. There may be pain or tenderness when swallowing or talking. You may have  other symptoms with a sore throat. These include coughing, sneezing, fever, or a swollen neck. A sore throat is often the first sign of another sickness. These sicknesses may include a cold, flu, strep throat, or an infection called mono. Most sore throats go away without medical treatment.  °HOME CARE  °Only take medicine as told by your doctor.  °Drink enough fluids to keep your pee (urine) clear or pale yellow.  °Rest as needed.  °Try using throat sprays, lozenges, or suck on hard candy (if older than 4 years or as told).  °Sip warm liquids, such as broth, herbal tea, or warm water with honey. Try sucking on frozen ice pops or drinking cold liquids.  °Rinse the mouth (gargle) with salt water. Mix 1 teaspoon salt with 8 ounces of water.  °Do not smoke. Avoid being around others when they are smoking.  °Put a humidifier in your bedroom at night to moisten the air. You can also turn on a hot shower and sit in the bathroom for 5-10 minutes. Be sure the bathroom door is closed. °GET HELP RIGHT AWAY IF:  °You have trouble breathing.  °You cannot swallow fluids, soft foods, or your spit (saliva).  °You have more puffiness (swelling) in the throat.  °Your sore throat does not get better in 7 days.  °You feel sick to your stomach (nauseous) and throw up (vomit).  °You have a fever or lasting symptoms for more than 2-3 days.  °You have a fever and your symptoms suddenly get worse. °MAKE SURE YOU:  °Understand these   instructions.  °Will watch your condition.  °Will get help right away if you are not doing well or get worse. °Document Released: 02/08/2008 Document Revised: 01/24/2012 Document Reviewed: 01/07/2012  °ExitCare® Patient Information ©2015 [employer]. This information is not intended to replace advice given to you by your health care provider. Make sure you discuss any questions you have with your health care provider.  ° ° °Tissue Adhesive Wound Care  ° ° °Some cuts and wounds can be closed with tissue  adhesive. Adhesive is like glue. It holds the skin together and helps a wound heal faster. This adhesive goes away on its own as the wound heals.  °HOME CARE  °Showers are allowed. Do not soak the wound in water. Do not take baths, swim, or use hot tubs. Do not use soaps or creams on your wound.  °If a bandage (dressing) was put on, change it as often as told by your doctor.  °Keep the bandage dry.  °Do not scratch, pick, or rub the adhesive.  °Do not put tape over the adhesive. The adhesive could come off.  °Protect the wound from another injury.  °Protect the wound from sun and tanning beds.  °Only take medicine as told by your doctor.  °Keep all doctor visits as told. °GET HELP RIGHT AWAY IF:  °Your wound is red, puffy (swollen), hot, or tender.  °You get a rash after the glue is put on.  °You have more pain in the wound.  °You have a red streak going away from the wound.  °You have yellowish-white fluid (pus) coming from the wound.  °You have more bleeding.  °You have a fever.  °You have chills and start to shake.  °You notice a bad smell coming from the wound.  °Your wound or adhesive breaks open. °MAKE SURE YOU:  °Understand these instructions.  °Will watch your condition.  °Will get help right away if you are not doing well or get worse. °Document Released: 02/08/2008 Document Revised: 02/19/2013 Document Reviewed: 11/20/2012  °ExitCare® Patient Information ©2015 [employer]. This information is not intended to replace advice given to you by your health care provider. Make sure you discuss any questions you have with your health care provider.  ° ° °

## 2014-07-16 NOTE — Anesthesia Postprocedure Evaluation (Signed)
  Anesthesia Post-op Note  Patient: Tyler Alvarez  Procedure(s) Performed: Procedure(s): EXCISION ANEURYSMAL AREA OF LEFT ARTERIOVENOUS FISTULA (Left)  Patient Location: PACU  Anesthesia Type:General  Level of Consciousness: awake and alert   Airway and Oxygen Therapy: Patient Spontanous Breathing  Post-op Pain: mild  Post-op Assessment: Post-op Vital signs reviewed, Patient's Cardiovascular Status Stable and Respiratory Function Stable  Post-op Vital Signs: Reviewed  Filed Vitals:   07/16/14 1507  BP: 139/80  Pulse: 94  Temp:   Resp: 25    Complications: No apparent anesthesia complications

## 2014-07-16 NOTE — Transfer of Care (Signed)
Immediate Anesthesia Transfer of Care Note  Patient: Tyler Alvarez  Procedure(s) Performed: Procedure(s): EXCISION ANEURYSMAL AREA OF LEFT ARTERIOVENOUS FISTULA (Left)  Patient Location: PACU  Anesthesia Type:General  Level of Consciousness: awake, alert , oriented and patient cooperative  Airway & Oxygen Therapy: Patient Spontanous Breathing and Patient connected to nasal cannula oxygen  Post-op Assessment: Report given to RN, Post -op Vital signs reviewed and stable and Patient moving all extremities  Post vital signs: Reviewed and stable  Last Vitals:  Filed Vitals:   07/16/14 0935  BP: 145/81  Pulse: 90  Temp: 36.7 C  Resp: 18    Complications: No apparent anesthesia complications

## 2014-07-16 NOTE — Consult Note (Signed)
PHARMACY CONSULT NOTE--POST-PROCEDURE ANTIBIOTICS   Consult  :  Post Procedure Dosing of Antibiotic[s] Indication :  Empiric Post-Procedure Prophylaxis   Pharmacy consulted for post-procedure dosing of Zinacef s/p excision of aneurysmal area of L-AVF.  Patient is to receive Zinacef  for empiric post- procedure prophylaxis.   Wt 62.6 kg,  CrCl 54 ml/min.  PLAN:  1. Zinacef 1.5g IV q12h ordered - dosing adjustments are not required. 2. Monitor renal function, WBC, fever curve, any cultures/sensitivities, and clinical progression. 3. Eradicate infection.   Thank you for allowing Pharmacy to participate in this patient's care.   Tyler Alvarez, Craig Guess,  Pharm.D.,  07/16/2014,  4:22 PM

## 2014-07-16 NOTE — Op Note (Signed)
    Patient name: Tyler Alvarez MRN: [id number] DOB: October 21, 1975 Sex: male  07/16/2014 Pre-operative Diagnosis: Aneurysmal left brachiocephalic fistula Post-operative diagnosis:  Same Surgeon:  Eldridge Abrahams Assistants:  Gerri Lins Procedure:   Resection of left brachiocephalic fistula aneurysm Anesthesia:  Gen. Blood Loss:  See anesthesia record Specimens:  None  Findings:  Very large calcified aneurysm.  This was resected for a length that corresponded to approximately one half of the upper arm.  I dissected out the arteriovenous anastomosis.  I left a small vein cuff to oversew.  Indications:  The patient has had progressive dilatation of a left AV fistula aneurysm.  This has not been used for 15 years since his kidney transplant.  He desired to have this removed  Procedure:  The patient was identified in the holding area and taken to [facility] [address]  The patient was then placed supine on the table. general anesthesia was administered.  The patient was prepped and draped in the usual sterile fashion.  A time out was called and antibiotics were administered.  An incision was made directly anterior to the aneurysm beginning at the antecubital crease and extending proximally.  Using Bovie cautery, the aneurysm was circumferentially dissected free.  There were 2 large, heavily calcified aneurysm to the distal aneurysm was probably 8 cm and proximally 6 cm.  I exposed the fistula down to the arterial venous anastomosis.  I placed a clamp at this level and transected the fistula.  I oversewed the vein leaving approximately a 2 mm vein cuff.  This was done with 2 layers of 5-0 Prolene.  I then proceeded more proximal on the fistula.  There was a heavily calcified area that I elected to resect.  This was fully mobilized.  Once I had it fully mobilized I placed a clamp proximally on the cephalic vein and transected the fistula.  I oversewed the proximal portion of the cephalic vein with 2 layers  of 5-0 Prolene and a 0 silk tie.  The wound was then copiously irrigated and hemostasis was achieved.  I did resect 2 panels of skin so that there was no redundancy.  The subcutaneous tissue was reapproximated with 3-0 Vicryl followed by closing the skin with 4-0 Vicryl and Dermabond.  4 x 4's Kerlix and Ace wrap was placed from the fingers to the shoulder.  There were no immediate complications.  1% lidocaine was instilled within the incision   Disposition:  To PACU in stable condition.   Theotis Burrow, M.D. [facility] Office: [phone] Pager:  [phone]

## 2014-07-16 NOTE — Interval H&P Note (Signed)
History and Physical Interval Note:  07/16/2014 12:25 PM  Tyler Alvarez  has presented today for surgery, with the diagnosis of Aneurysmal left arm arteriovenous fistula I72.9  The various methods of treatment have been discussed with the patient and family. After consideration of risks, benefits and other options for treatment, the patient has consented to  Procedure(s): EXCISION ANEURYSMAL AREA OF ARTERIOVENOUS FISTULA (Left) as a surgical intervention .  The patient's history has been reviewed, patient examined, no change in status, stable for surgery.  I have reviewed the patient's chart and labs.  Questions were answered to the patient's satisfaction.     BRABHAM IV, V. WELLS

## 2014-07-16 NOTE — Progress Notes (Signed)
Dr.Manny is gone. Called Dr. Ola Spurr for sign out

## 2014-07-16 NOTE — Anesthesia Preprocedure Evaluation (Addendum)
Anesthesia Evaluation  Patient identified by MRN, date of birth, ID band Patient awake    Reviewed: Allergy & Precautions, H&P , NPO status , Patient's Chart, lab work & pertinent test results  Airway Mallampati: I  TM Distance: >3 FB Neck ROM: Full    Dental no notable dental hx. (+) Poor Dentition, Dental Advisory Given   Pulmonary neg pulmonary ROS,  breath sounds clear to auscultation  Pulmonary exam normal       Cardiovascular hypertension, Pt. on medications Rhythm:Regular Rate:Normal     Neuro/Psych  Headaches, Seizures -, Well Controlled,  PSYCHIATRIC DISORDERS negative psych ROS   GI/Hepatic Neg liver ROS, GERD-  Medicated and Controlled,  Endo/Other  negative endocrine ROS  Renal/GU ESRFRenal diseaseS/p kidney transplant  negative genitourinary   Musculoskeletal   Abdominal   Peds  Hematology negative hematology ROS (+)   Anesthesia Other Findings   Reproductive/Obstetrics negative OB ROS                           Anesthesia Physical Anesthesia Plan  ASA: III  Anesthesia Plan: General   Post-op Pain Management:    Induction: Intravenous  Airway Management Planned: LMA  Additional Equipment:   Intra-op Plan:   Post-operative Plan: Extubation in OR  Informed Consent: I have reviewed the patients History and Physical, chart, labs and discussed the procedure including the risks, benefits and alternatives for the proposed anesthesia with the patient or authorized representative who has indicated his/her understanding and acceptance.   Dental advisory given and Dental Advisory Given  Plan Discussed with: CRNA and Anesthesiologist  Anesthesia Plan Comments:        Anesthesia Quick Evaluation

## 2014-07-16 NOTE — Anesthesia Procedure Notes (Signed)
Procedure Name: Intubation Date/Time: 07/16/2014 12:47 PM Performed by: Neldon Newport Pre-anesthesia Checklist: Patient being monitored, Suction available, Emergency Drugs available, Patient identified and Timeout performed Patient Re-evaluated:Patient Re-evaluated prior to inductionOxygen Delivery Method: Circle system utilized Preoxygenation: Pre-oxygenation with 100% oxygen Intubation Type: IV induction Ventilation: Mask ventilation without difficulty Laryngoscope Size: Mac and 3 Grade View: Grade I Tube type: Oral Tube size: 7.5 mm Number of attempts: 1 Placement Confirmation: positive ETCO2,  ETT inserted through vocal cords under direct vision and breath sounds checked- equal and bilateral Secured at: 24 cm Tube secured with: Tape Dental Injury: Teeth and Oropharynx as per pre-operative assessment

## 2014-07-17 ENCOUNTER — Encounter (HOSPITAL_COMMUNITY): Payer: Self-pay | Admitting: Surgery

## 2014-07-17 LAB — BASIC METABOLIC PANEL
Anion gap: 7 (ref 5–15)
BUN: 13 mg/dL (ref 6–23)
CHLORIDE: 106 mmol/L (ref 96–112)
CO2: 24 mmol/L (ref 19–32)
Calcium: 8.9 mg/dL (ref 8.4–10.5)
Creatinine, Ser: 1.55 mg/dL — ABNORMAL HIGH (ref 0.50–1.35)
GFR calc non Af Amer: 54 mL/min — ABNORMAL LOW (ref >90)
GFR, EST AFRICAN AMERICAN: 63 mL/min — AB (ref >90)
Glucose, Bld: 115 mg/dL — ABNORMAL HIGH (ref 70–99)
POTASSIUM: 4 mmol/L (ref 3.5–5.1)
Sodium: 137 mmol/L (ref 135–145)

## 2014-07-17 MED ORDER — OXYCODONE HCL 5 MG PO TABS: 5.0000 mg | ORAL_TABLET | Freq: Four times a day (QID) | ORAL | Status: DC | PRN

## 2014-07-17 NOTE — Progress Notes (Addendum)
[facility]  Subjective  - He is doing well.  Mild pain taking PO's well and voided.   Objective 136/82 88 98.2 F (36.8 C) (Oral) 18 97%  Intake/Output Summary (Last 24 hours) at 07/17/14 0719 Last data filed at 07/17/14 0631  Gross per 24 hour  Intake 2296.67 ml  Output   1125 ml  Net 1171.67 ml    Left radial pulse palp, sensation intact Incision clean and dry   Assessment/Planning: POD #1 Resection of left brachiocephalic fistula aneurysm  Plan discharge home on PO pain meds F/U in 2 weeks with Dr. Trula Slade D/C IV fluids and new dressings in room  Tyler Alvarez [name] 07/17/2014 7:19 AM --  Laboratory Lab Results:  Recent Labs  07/16/14 0952  HGB 12.9*  HCT 38.0*   BMET  Recent Labs  07/16/14 0952 07/16/14 1710  NA 140 137  K 4.4 3.9  CL  --  106  CO2  --  26  GLUCOSE 117* 102*  BUN  --  21  CREATININE  --  1.61*  CALCIUM  --  8.8    COAG Lab Results  Component Value Date   INR 1.09 07/16/2014   No results found for: PTT    Agree with above Incision looks good  Will check Cr and d/c home  Tyler Alvarez

## 2014-07-20 ENCOUNTER — Telehealth: Payer: Self-pay

## 2014-07-20 ENCOUNTER — Inpatient Hospital Stay (HOSPITAL_COMMUNITY)
Admission: EM | Admit: 2014-07-20 | Discharge: 2014-07-27 | DRG: 863 | Disposition: A | Discharge status: Home Health | Payer: Medicare Other | Attending: Family Medicine | Admitting: Family Medicine

## 2014-07-20 ENCOUNTER — Emergency Department (HOSPITAL_COMMUNITY): Payer: Medicare Other

## 2014-07-20 ENCOUNTER — Other Ambulatory Visit: Payer: Self-pay

## 2014-07-20 ENCOUNTER — Encounter (HOSPITAL_COMMUNITY): Payer: Self-pay

## 2014-07-20 ENCOUNTER — Ambulatory Visit: Payer: Medicare Other | Admitting: Family Medicine

## 2014-07-20 DIAGNOSIS — I82B12 Acute embolism and thrombosis of left subclavian vein: Secondary | ICD-10-CM | POA: Diagnosis not present

## 2014-07-20 DIAGNOSIS — Z7982 Long term (current) use of aspirin: Secondary | ICD-10-CM

## 2014-07-20 DIAGNOSIS — I809 Phlebitis and thrombophlebitis of unspecified site: Secondary | ICD-10-CM | POA: Diagnosis present

## 2014-07-20 DIAGNOSIS — L03114 Cellulitis of left upper limb: Secondary | ICD-10-CM | POA: Diagnosis present

## 2014-07-20 DIAGNOSIS — L0211 Cutaneous abscess of neck: Secondary | ICD-10-CM | POA: Diagnosis not present

## 2014-07-20 DIAGNOSIS — L0390 Cellulitis, unspecified: Secondary | ICD-10-CM | POA: Diagnosis present

## 2014-07-20 DIAGNOSIS — Z7952 Long term (current) use of systemic steroids: Secondary | ICD-10-CM | POA: Diagnosis not present

## 2014-07-20 DIAGNOSIS — T814XXA Infection following a procedure, initial encounter: Secondary | ICD-10-CM | POA: Diagnosis not present

## 2014-07-20 DIAGNOSIS — R000 Tachycardia, unspecified: Secondary | ICD-10-CM | POA: Diagnosis present

## 2014-07-20 DIAGNOSIS — I10 Essential (primary) hypertension: Secondary | ICD-10-CM | POA: Diagnosis not present

## 2014-07-20 DIAGNOSIS — G40909 Epilepsy, unspecified, not intractable, without status epilepticus: Secondary | ICD-10-CM | POA: Diagnosis present

## 2014-07-20 DIAGNOSIS — K5900 Constipation, unspecified: Secondary | ICD-10-CM | POA: Diagnosis not present

## 2014-07-20 DIAGNOSIS — R112 Nausea with vomiting, unspecified: Secondary | ICD-10-CM | POA: Diagnosis present

## 2014-07-20 DIAGNOSIS — K219 Gastro-esophageal reflux disease without esophagitis: Secondary | ICD-10-CM | POA: Diagnosis present

## 2014-07-20 DIAGNOSIS — T8172XD Complication of vein following a procedure, not elsewhere classified, subsequent encounter: Secondary | ICD-10-CM | POA: Diagnosis not present

## 2014-07-20 DIAGNOSIS — T8172XA Complication of vein following a procedure, not elsewhere classified, initial encounter: Secondary | ICD-10-CM | POA: Insufficient documentation

## 2014-07-20 DIAGNOSIS — D649 Anemia, unspecified: Secondary | ICD-10-CM | POA: Diagnosis present

## 2014-07-20 DIAGNOSIS — IMO0001 Reserved for inherently not codable concepts without codable children: Secondary | ICD-10-CM

## 2014-07-20 DIAGNOSIS — T8149XA Infection following a procedure, other surgical site, initial encounter: Secondary | ICD-10-CM | POA: Insufficient documentation

## 2014-07-20 DIAGNOSIS — Z940 Kidney transplant status: Secondary | ICD-10-CM | POA: Diagnosis not present

## 2014-07-20 DIAGNOSIS — Z79899 Other long term (current) drug therapy: Secondary | ICD-10-CM

## 2014-07-20 DIAGNOSIS — Q851 Tuberous sclerosis: Secondary | ICD-10-CM | POA: Diagnosis not present

## 2014-07-20 DIAGNOSIS — Y832 Surgical operation with anastomosis, bypass or graft as the cause of abnormal reaction of the patient, or of later complication, without mention of misadventure at the time of the procedure: Secondary | ICD-10-CM | POA: Diagnosis present

## 2014-07-20 DIAGNOSIS — T814XXD Infection following a procedure, subsequent encounter: Secondary | ICD-10-CM | POA: Diagnosis not present

## 2014-07-20 LAB — CBC
HEMATOCRIT: 34.3 % — AB (ref 39.0–52.0)
Hemoglobin: 12 g/dL — ABNORMAL LOW (ref 13.0–17.0)
MCH: 30.2 pg (ref 26.0–34.0)
MCHC: 35 g/dL (ref 30.0–36.0)
MCV: 86.4 fL (ref 78.0–100.0)
PLATELETS: 245 10*3/uL (ref 150–400)
RBC: 3.97 MIL/uL — AB (ref 4.22–5.81)
RDW: 12.7 % (ref 11.5–15.5)
WBC: 14.2 10*3/uL — AB (ref 4.0–10.5)

## 2014-07-20 LAB — BASIC METABOLIC PANEL
ANION GAP: 13 (ref 5–15)
BUN: 15 mg/dL (ref 6–23)
CHLORIDE: 96 mmol/L (ref 96–112)
CO2: 24 mmol/L (ref 19–32)
CREATININE: 1.52 mg/dL — AB (ref 0.50–1.35)
Calcium: 9.6 mg/dL (ref 8.4–10.5)
GFR calc Af Amer: 65 mL/min — ABNORMAL LOW (ref >90)
GFR calc non Af Amer: 56 mL/min — ABNORMAL LOW (ref >90)
Glucose, Bld: 104 mg/dL — ABNORMAL HIGH (ref 70–99)
POTASSIUM: 4.6 mmol/L (ref 3.5–5.1)
SODIUM: 133 mmol/L — AB (ref 135–145)

## 2014-07-20 LAB — I-STAT TROPONIN, ED: TROPONIN I, POC: 0 ng/mL (ref 0.00–0.08)

## 2014-07-20 LAB — URINE MICROSCOPIC-ADD ON

## 2014-07-20 LAB — URINALYSIS, ROUTINE W REFLEX MICROSCOPIC
Glucose, UA: NEGATIVE mg/dL
Hgb urine dipstick: NEGATIVE
KETONES UR: 15 mg/dL — AB
Leukocytes, UA: NEGATIVE
NITRITE: NEGATIVE
PH: 5 (ref 5.0–8.0)
PROTEIN: 30 mg/dL — AB
Specific Gravity, Urine: 1.02 (ref 1.005–1.030)
Urobilinogen, UA: 1 mg/dL (ref 0.0–1.0)

## 2014-07-20 LAB — TROPONIN I: Troponin I: <0.03 ng/mL (ref <0.031)

## 2014-07-20 MED ORDER — SODIUM CHLORIDE 0.9 % IV SOLN
INTRAVENOUS | Status: AC
  Administered 2014-07-20: 20:00:00 via INTRAVENOUS

## 2014-07-20 MED ORDER — CEFTRIAXONE SODIUM IN DEXTROSE 20 MG/ML IV SOLN
1.0000 g | INTRAVENOUS | Status: DC
  Administered 2014-07-20: 1 g via INTRAVENOUS
  Filled 2014-07-20: qty 50

## 2014-07-20 MED ORDER — GABAPENTIN 300 MG PO CAPS
300.0000 mg | ORAL_CAPSULE | Freq: Every day | ORAL | Status: DC
  Administered 2014-07-21: 300 mg via ORAL
  Filled 2014-07-20 (×2): qty 1

## 2014-07-20 MED ORDER — ONDANSETRON HCL 4 MG/2ML IJ SOLN
4.0000 mg | Freq: Four times a day (QID) | INTRAMUSCULAR | Status: DC | PRN
  Administered 2014-07-24: 4 mg via INTRAVENOUS
  Filled 2014-07-20: qty 2

## 2014-07-20 MED ORDER — VANCOMYCIN HCL IN DEXTROSE 1-5 GM/200ML-% IV SOLN
1000.0000 mg | Freq: Once | INTRAVENOUS | Status: AC
  Administered 2014-07-20: 1000 mg via INTRAVENOUS
  Filled 2014-07-20: qty 200

## 2014-07-20 MED ORDER — VANCOMYCIN HCL 10 G IV SOLR
1250.0000 mg | INTRAVENOUS | Status: DC
  Administered 2014-07-21 – 2014-07-24 (×4): 1250 mg via INTRAVENOUS
  Filled 2014-07-20 (×4): qty 1250

## 2014-07-20 MED ORDER — SODIUM CHLORIDE 0.9 % IV SOLN: INTRAVENOUS | Status: DC

## 2014-07-20 MED ORDER — OXYCODONE HCL 5 MG PO TABS
5.0000 mg | ORAL_TABLET | Freq: Four times a day (QID) | ORAL | Status: DC | PRN
  Administered 2014-07-21 – 2014-07-27 (×11): 5 mg via ORAL
  Filled 2014-07-20 (×11): qty 1

## 2014-07-20 MED ORDER — ONDANSETRON HCL 4 MG/2ML IJ SOLN: 4.0000 mg | Freq: Three times a day (TID) | INTRAMUSCULAR | Status: DC | PRN

## 2014-07-20 MED ORDER — POLYETHYLENE GLYCOL 3350 17 G PO PACK
17.0000 g | PACK | Freq: Every day | ORAL | Status: DC | PRN
  Filled 2014-07-20: qty 1

## 2014-07-20 MED ORDER — SODIUM CHLORIDE 0.9 % IJ SOLN
3.0000 mL | Freq: Two times a day (BID) | INTRAMUSCULAR | Status: DC
  Administered 2014-07-22 – 2014-07-27 (×7): 3 mL via INTRAVENOUS

## 2014-07-20 MED ORDER — ASPIRIN 81 MG PO CHEW
81.0000 mg | CHEWABLE_TABLET | Freq: Every day | ORAL | Status: DC
  Administered 2014-07-21 – 2014-07-27 (×4): 81 mg via ORAL
  Filled 2014-07-20 (×7): qty 1

## 2014-07-20 MED ORDER — HYDROMORPHONE HCL 1 MG/ML IJ SOLN: 0.5000 mg | INTRAMUSCULAR | Status: DC | PRN

## 2014-07-20 MED ORDER — PANTOPRAZOLE SODIUM 40 MG PO TBEC
40.0000 mg | DELAYED_RELEASE_TABLET | Freq: Every day | ORAL | Status: DC
  Administered 2014-07-21 – 2014-07-27 (×6): 40 mg via ORAL
  Filled 2014-07-20 (×6): qty 1

## 2014-07-20 MED ORDER — TACROLIMUS 1 MG PO CAPS
1.0000 mg | ORAL_CAPSULE | Freq: Two times a day (BID) | ORAL | Status: DC
  Administered 2014-07-20 – 2014-07-21 (×2): 1 mg via ORAL
  Filled 2014-07-20 (×3): qty 1

## 2014-07-20 MED ORDER — AMLODIPINE BESYLATE 5 MG PO TABS
5.0000 mg | ORAL_TABLET | Freq: Every day | ORAL | Status: DC
  Administered 2014-07-21 – 2014-07-27 (×7): 5 mg via ORAL
  Filled 2014-07-20 (×8): qty 1

## 2014-07-20 MED ORDER — DIVALPROEX SODIUM 250 MG PO DR TAB
500.0000 mg | DELAYED_RELEASE_TABLET | Freq: Every evening | ORAL | Status: DC
  Administered 2014-07-22 – 2014-07-26 (×5): 500 mg via ORAL
  Filled 2014-07-20: qty 1
  Filled 2014-07-20 (×6): qty 2
  Filled 2014-07-20: qty 1
  Filled 2014-07-20 (×2): qty 2
  Filled 2014-07-20: qty 1

## 2014-07-20 MED ORDER — ACETAMINOPHEN 325 MG PO TABS
650.0000 mg | ORAL_TABLET | Freq: Four times a day (QID) | ORAL | Status: DC | PRN
  Administered 2014-07-20 – 2014-07-22 (×3): 650 mg via ORAL
  Filled 2014-07-20 (×3): qty 2

## 2014-07-20 MED ORDER — IMIPRAMINE HCL 50 MG PO TABS
50.0000 mg | ORAL_TABLET | Freq: Every day | ORAL | Status: DC
  Administered 2014-07-21 – 2014-07-26 (×6): 50 mg via ORAL
  Filled 2014-07-20 (×8): qty 1

## 2014-07-20 MED ORDER — ONDANSETRON HCL 4 MG PO TABS: 4.0000 mg | ORAL_TABLET | Freq: Four times a day (QID) | ORAL | Status: DC | PRN

## 2014-07-20 MED ORDER — SODIUM CHLORIDE 0.9 % IV SOLN
INTRAVENOUS | Status: DC
  Administered 2014-07-20 – 2014-07-21 (×3): via INTRAVENOUS
  Administered 2014-07-22: 1000 mL via INTRAVENOUS
  Administered 2014-07-23: 21:00:00 via INTRAVENOUS

## 2014-07-20 MED ORDER — HEPARIN SODIUM (PORCINE) 5000 UNIT/ML IJ SOLN
5000.0000 [IU] | Freq: Three times a day (TID) | INTRAMUSCULAR | Status: DC
  Administered 2014-07-20 – 2014-07-21 (×2): 5000 [IU] via SUBCUTANEOUS
  Filled 2014-07-20 (×4): qty 1

## 2014-07-20 MED ORDER — SODIUM CHLORIDE 0.9 % IV BOLUS (SEPSIS)
1000.0000 mL | Freq: Once | INTRAVENOUS | Status: AC
  Administered 2014-07-20: 1000 mL via INTRAVENOUS

## 2014-07-20 MED ORDER — PREDNISONE 5 MG PO TABS
5.0000 mg | ORAL_TABLET | Freq: Every day | ORAL | Status: DC
  Administered 2014-07-20 – 2014-07-27 (×8): 5 mg via ORAL
  Filled 2014-07-20 (×8): qty 1

## 2014-07-20 NOTE — Telephone Encounter (Signed)
Phone call from pt's mother.  Reported the pt. Had surgery last Thursday, 3/3, and started feeling bad on Saturday.  Reported pt. c/o headache, nausea, dizziness, and shortness of breath, and has had low grade fever of 99.5-99.9 degrees.  Reported his temp. at 8:00 AM, was 99.7 degrees.  Stated he hasn't eaten since Saturday, due to the "dry heaves".  Also reported his swelling of the left arm above the elbow has increased since he was sent home.  Stated there was a small amt. Of bleeding from the incision on Saturday.  Stated this morning there is dried blood on the bandage, and it hasn't been removed since Saturday.  Reported he has been elevating above the level of the heart frequently.  Pt's. mother voiced concern about his low grade fever due to hx of kidney transplant in 2001.  Will discuss with Dr. Trula Slade.

## 2014-07-20 NOTE — Progress Notes (Signed)
ANTIBIOTIC CONSULT NOTE - INITIAL  Pharmacy Consult for Vancomycin Indication: cellulitis  Allergies  Allergen Reactions  . Erythromycin Nausea And Vomiting  . Sulfa Antibiotics Nausea And Vomiting    Other reaction(s): ITCHING  . Sulfacetamide Sodium     Patient Measurements: Height: 6' (182.9 cm) Weight: 138 lb (62.596 kg) IBW/kg (Calculated) : 77.6  Vital Signs: Temp: 102.4 F (39.1 C) (03/07 1906) Temp Source: Oral (03/07 1906) BP: 147/85 mmHg (03/07 1906) Pulse Rate: 103 (03/07 1906) Intake/Output from previous day:   Intake/Output from this shift:    Labs:  Recent Labs  07/20/14 1611  WBC 14.2*  HGB 12.0*  PLT 245  CREATININE 1.52*   Estimated Creatinine Clearance: 57.2 mL/min (by C-G formula based on Cr of 1.52). No results for input(s): VANCOTROUGH, VANCOPEAK, VANCORANDOM, GENTTROUGH, GENTPEAK, GENTRANDOM, TOBRATROUGH, TOBRAPEAK, TOBRARND, AMIKACINPEAK, AMIKACINTROU, AMIKACIN in the last 72 hours.   Microbiology: No results found for this or any previous visit (from the past 720 hour(s)).  Medical History: Past Medical History  Diagnosis Date  . Hypertension   . Chronic kidney disease     s/p transplant  . Seizures     treated by Dr. Melrose Nakayama  . ADD (attention deficit disorder)   . Tuberous sclerosis   . Immune deficiency disorder   . Headache     Migraines  . GERD (gastroesophageal reflux disease)     Medications:  Prescriptions prior to admission  Medication Sig Dispense Refill Last Dose  . acetaminophen (TYLENOL) 325 MG tablet Take 650 mg by mouth every 6 (six) hours as needed for mild pain.   Past Week at Unknown time  . amLODipine (NORVASC) 5 MG tablet Take 5 mg by mouth daily.    07/19/2014 at Unknown time  . aspirin 81 MG chewable tablet Chew 81 mg by mouth daily.   07/19/2014 at Unknown time  . brompheniramine-pseudoephedrine (DIMETAPP) 1-15 MG/5ML ELIX Take 5 mLs by mouth 2 (two) times daily as needed for allergies.   Past Month at Unknown  time  . divalproex (DEPAKOTE) 250 MG EC tablet Take 500 mg by mouth every evening.    07/19/2014 at Unknown time  . gabapentin (NEURONTIN) 300 MG capsule Take 300 mg by mouth daily.    07/19/2014 at Unknown time  . imipramine (TOFRANIL) 25 MG tablet Take 50 mg by mouth at bedtime.    07/19/2014 at Unknown time  . omeprazole (PRILOSEC) 20 MG capsule Take 20 mg by mouth daily.     07/19/2014 at Unknown time  . oxyCODONE (OXY IR/ROXICODONE) 5 MG immediate release tablet Take 1 tablet (5 mg total) by mouth every 6 (six) hours as needed for severe pain. 30 tablet 0 07/19/2014 at Unknown time  . predniSONE (DELTASONE) 5 MG tablet Take 5 mg by mouth daily.    07/19/2014 at Unknown time  . tacrolimus (PROGRAF) 1 MG capsule Take 1 mg by mouth 2 (two) times daily.     07/19/2014 at Unknown time  . doxycycline (VIBRA-TABS) 100 MG tablet Take 1 tablet (100 mg total) by mouth 2 (two) times daily. (Patient not taking: Reported on 06/22/2014) 20 tablet 0 Completed Course at Unknown time   Scheduled:  . sodium chloride   Intravenous STAT  . amLODipine  5 mg Oral Daily  . aspirin  81 mg Oral Daily  . divalproex  500 mg Oral QPM  . gabapentin  300 mg Oral Daily  . heparin  5,000 Units Subcutaneous 3 times per day  . imipramine  50 mg Oral QHS  . pantoprazole  40 mg Oral Daily  . predniSONE  5 mg Oral Daily  . sodium chloride  3 mL Intravenous Q12H  . tacrolimus  1 mg Oral BID   Infusions:     Assessment: 39yo male with history of tuberus sclerosis and kidney transplant on prograf with recent excision of L brachiocephalic fistula aneurysm on 3/3 presents with fever, nausea, and L arm swelling/pain. Pharmacy is consulted to dose vancomycin for cellulitis. Pt is febrile to 102.4, WBC 14.2, sCr 1.52.  Pt received vancomycin 1g IV in the ED  Goal of Therapy:  Vancomycin trough level 10-15 mcg/ml  Plan:  Vancomycin 1250mg  IV q24h Expected duration 10 days with resolution of temperature and/or normalization of  WBC Measure antibiotic drug levels at steady state Follow up culture results, renal function, and clinical course  Andrey Cota. Diona Foley, PharmD Clinical Pharmacist Pager [phone] 07/20/2014,8:46 PM

## 2014-07-20 NOTE — ED Provider Notes (Signed)
CSN: [id number]     Arrival date & time 07/20/14  1356 History   First MD Initiated Contact with Patient 07/20/14 1517     Chief Complaint  Patient presents with  . Nausea  . Emesis  . Chest Pain     (Consider location/radiation/quality/duration/timing/severity/associated sxs/prior Treatment) HPI   Patient with hx tuberus sclerosis, ADD, kidney transplant remotely on prograf with excision of left brachiocephalic fistula aneurysm 07/16/14, p/w fatigue, nausea and dry heaves, temperature of 99 at home, left arm swelling and pain.  He notes notes chest tightness and mild SOB that has been ongoing x 2-3 days and constant, with no exacerbating or palliative symptoms.  No BM since leaving the hospital, is on pain medications without stool softeners.  Notes single episode of abdominal pain located either over his transplanted kidney or where he was given an injection in the hospital - he is not able to provide more information than that.  Denies urinary symptoms with exception of noting dark urine.   Past Medical History  Diagnosis Date  . Hypertension   . Chronic kidney disease     s/p transplant  . Seizures     treated by Dr. Melrose Nakayama  . ADD (attention deficit disorder)   . Tuberous sclerosis   . Immune deficiency disorder   . Headache     Migraines  . GERD (gastroesophageal reflux disease)    Past Surgical History  Procedure Laterality Date  . Kidney transplant    . Av fistula placement    . Revison of arteriovenous fistula Left 07/16/2014    Procedure: EXCISION ANEURYSMAL AREA OF LEFT ARTERIOVENOUS FISTULA;  Surgeon: Serafina Mitchell, MD;  Location: [facility] OR;  Service: Vascular;  Laterality: Left;   Family History  Problem Relation Age of Onset  . Cancer Other    History  Substance Use Topics  . Smoking status: Never Smoker   . Smokeless tobacco: Not on file  . Alcohol Use: No    Review of Systems  All other systems reviewed and are negative.     Allergies  Erythromycin;  Sulfa antibiotics; and Sulfacetamide sodium  Home Medications   Prior to Admission medications   Medication Sig Start Date End Date Taking? Authorizing Provider  acetaminophen (TYLENOL) 325 MG tablet Take 650 mg by mouth every 6 (six) hours as needed for mild pain.   Yes Historical Provider, MD  amLODipine (NORVASC) 5 MG tablet Take 5 mg by mouth daily.    Yes Historical Provider, MD  aspirin 81 MG chewable tablet Chew 81 mg by mouth daily.   Yes Historical Provider, MD  brompheniramine-pseudoephedrine (DIMETAPP) 1-15 MG/5ML ELIX Take 5 mLs by mouth 2 (two) times daily as needed for allergies.   Yes Historical Provider, MD  divalproex (DEPAKOTE) 250 MG EC tablet Take 500 mg by mouth every evening.    Yes Historical Provider, MD  gabapentin (NEURONTIN) 300 MG capsule Take 300 mg by mouth daily.    Yes Historical Provider, MD  imipramine (TOFRANIL) 25 MG tablet Take 50 mg by mouth at bedtime.    Yes Historical Provider, MD  omeprazole (PRILOSEC) 20 MG capsule Take 20 mg by mouth daily.     Yes Historical Provider, MD  oxyCODONE (OXY IR/ROXICODONE) 5 MG immediate release tablet Take 1 tablet (5 mg total) by mouth every 6 (six) hours as needed for severe pain. 07/17/14  Yes Ulyses Amor, PA-C  predniSONE (DELTASONE) 5 MG tablet Take 5 mg by mouth daily.  Yes Historical Provider, MD  tacrolimus (PROGRAF) 1 MG capsule Take 1 mg by mouth 2 (two) times daily.     Yes Historical Provider, MD  doxycycline (VIBRA-TABS) 100 MG tablet Take 1 tablet (100 mg total) by mouth 2 (two) times daily. Patient not taking: Reported on 06/22/2014 04/05/12   Willeen Niece, MD   BP 138/89 mmHg  Pulse 97  Temp(Src) 98.9 F (37.2 C) (Oral)  Resp 22  Ht 6' (1.829 m)  Wt 138 lb (62.596 kg)  BMI 18.71 kg/m2  SpO2 97% Physical Exam  Constitutional: He appears well-developed and well-nourished. No distress.  HENT:  Head: Normocephalic and atraumatic.  Neck: Neck supple.  Cardiovascular: Normal rate and regular  rhythm.   Pulmonary/Chest: Effort normal and breath sounds normal. No respiratory distress. He has no wheezes. He has no rales.  Abdominal: Soft. He exhibits no distension and no mass. There is no tenderness. There is no rebound and no guarding.  Neurological: He is alert. He exhibits normal muscle tone.  Skin: He is not diaphoretic.  Left upper arm with large intact surgical incision.  Small amount of dried blood at various locations, some ecchymosis distally.  There is erythema, edema, warmth, and tenderness extending from the top of the incision up the patient's left shoulder and over the upper part of his left chest.    Nursing note and vitals reviewed.   ED Course  Procedures (including critical care time) Labs Review Labs Reviewed  CBC - Abnormal; Notable for the following:    WBC 14.2 (*)    RBC 3.97 (*)    Hemoglobin 12.0 (*)    HCT 34.3 (*)    All other components within normal limits  BASIC METABOLIC PANEL - Abnormal; Notable for the following:    Sodium 133 (*)    Glucose, Bld 104 (*)    Creatinine, Ser 1.52 (*)    GFR calc non Af Amer 56 (*)    GFR calc Af Amer 65 (*)    All other components within normal limits  CULTURE, BLOOD (ROUTINE X 2)  CULTURE, BLOOD (ROUTINE X 2)  URINE CULTURE  URINALYSIS, ROUTINE W REFLEX MICROSCOPIC  I-STAT TROPOININ, ED    Imaging Review Dg Chest 2 View  07/20/2014   CLINICAL DATA:  Cough, congestion, nausea, weakness  EXAM: CHEST  2 VIEW  COMPARISON:  12/01/2011  FINDINGS: Cardiomediastinal silhouette is stable. No acute infiltrate or pleural effusion. No pulmonary edema. Stable chronic mild interstitial prominence.  IMPRESSION: No active cardiopulmonary disease.   Electronically Signed   By: Lahoma Crocker M.D.   On: 07/20/2014 15:57     EKG Interpretation None        Date: 07/20/2014  Rate: 104  Rhythm: sinus tachycardia  QRS Axis: normal  Intervals: normal  ST/T Wave abnormalities: normal  Conduction Disutrbances:none   Narrative Interpretation:   Old EKG Reviewed: none available     4:55 PM I spoke with Dr Donnetta Hutching who will come and see the patient.    5:08 PM Admitted to patient's PCP  [place].   MDM   Final diagnoses:  Postoperative cellulitis of surgical wound, initial encounter    Immunocompromised pt s/p remote kidney transplant on prograf with surgical resection 07/16/14 of left brachiocephalic fistula aneurysm by Dr Trula Slade w/ cellulitis of left upper arm.  Single blood culture obtained.  WBC 14.  Renal function is baseline.  Oral temp 99.9, occasional tachycardia.  VS stable.  IVF, Vancomycin started.  Dr Donnetta Hutching to see pt in ED to consult.  Pt be admitted by patient's PCP [place].      Clayton Bibles, PA-C 07/20/14 [address], PA-C 07/20/14 [place], MD 07/20/14 [phone]

## 2014-07-20 NOTE — ED Notes (Signed)
Per [facility], pt from MD office where there was an hour wait so they called 911 to come here. Pt had a fistula removed on Thursday last week after having a kidney transplant 15 yrs ago. Since Friday has been unable to keep anything down. Denies any diarrhea. Only reports dry heaves and nausea. 22g to left hand. Given 4 mg of zofran and 250 NS. Denies any fever. ST on the monitor.

## 2014-07-20 NOTE — ED Notes (Signed)
Pt had a fistula removed Thursday and since has had nausea, and redness above surgical site that is red and warm to the touch.  Pt stated he has mid sternal chestpain 8/10 with nausea and SOB since Saturday.

## 2014-07-20 NOTE — Telephone Encounter (Signed)
Discussed pt's symptoms with Dr. Trula Slade.  Advised to have pt. Contact his PCP to report symptoms and be evaluated from medical standpoint.  Notified pt's mother of recommendations from Dr. Trula Slade.  Verb. Understanding.  Agreed.

## 2014-07-20 NOTE — H&P (Signed)
[facility] Admission History and Physical Service Pager: [phone]  Patient name: Tyler Alvarez Medical record number: [id number] Date of birth: 04/29/1976 Age: 39 y.o. Gender: male  Primary Care Provider: Conni Slipper, MD Consultants: vascular  Code Status: Full   Chief Complaint: cellulitis   Assessment and Plan: Tyler Alvarez is a 39 y.o. male presenting with left upper extremity cellulitis with recent excision of large aneurysmal upper arm AV fistula. 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   #Cellulitis/Superficial thrombophlebitis: Recent excision of large aneurysmal upper AV fistula on 07/16/14. Presents with erythema, pain and fever 102 today. He is on chronic immunosuppression for kidney transplant.  - admit to family medicine, Dr. Mingo Amber attending.  - vanc per pharm  - blood cultures  - UA pending, UCx  - tylenol for fever PRN  - Upper extremity venous duplex - vascular consulted and will follow.    #ESRD s/p transplant: making urine and no suggestion of rejection. Spoke with renal (Dr. Moshe Cipro) about continue medication in light of infection. Will continue meds for now.  - continue prednisone and prograf.   #Chest pain: reporting some chest pain but patient is not a good historian. EKG showing sinus tachycardia. Will rule out ACS.  - troponins  - telemetry   #Tuberous Sclerosis: led to his kidney failure.   #Left neck mass: reports that he has been followed by ENT as an outpatient. States that it is a result of his Tuberous sclerosis. No problems breathing or swallowing. Previously with an abscess in the area that was I&D.   #HTN: controlled  - continue medications   #Seizure d/o: unsure of last seizure  - will continue medication   FEN/GI: regular diet  Prophylaxis: subQ hep    Disposition: admitted to telemetry for treatment of cellulitis.   History of Present Illness: Tyler Alvarez is a 39 y.o. male presenting with cellulitis of left upper extremity.  Patient had excision of left brachiocephalic fistula aneurysm 07/16/14. On Saturday he started having pain in his arm as well as nausea, chills, dry heaves and tactile fever. This continued over the weekend and became worse today. His mother called EMS due to the pain in his arm and the worsening of his symptoms.  Reporting some chest pain associated with his cellulitis. Mild shortness of breath that has been ongoing for the past 2-3 days. He hasn't been able to have a bowel movement since leaving the hospital. Denies any problems voiding.   Review Of Systems: Per HPI with the following additions: See HPI  Otherwise 12 point review of systems was performed and was unremarkable.  Patient Active Problem List   Diagnosis Date Noted  . Cellulitis 07/20/2014  . Aneurysm 07/16/2014  . Abscess of neck 04/05/2012  . Tuberous sclerosis 04/05/2012  . Viral URI with cough 03/18/2012  . Well adult health check 11/08/2010  . END STAGE RENAL DISEASE 12/31/2008  . ACUTE LYMPHADENITIS 12/31/2008  . HEARING LOSS, LEFT EAR 11/25/2008   Past Medical History: Past Medical History  Diagnosis Date  . Hypertension   . Chronic kidney disease     s/p transplant  . Seizures     treated by Dr. Melrose Nakayama  . ADD (attention deficit disorder)   . Tuberous sclerosis   . Immune deficiency disorder   . Headache     Migraines  . GERD (gastroesophageal  reflux disease)    Past Surgical History: Past Surgical History  Procedure Laterality Date  . Kidney transplant    . Av fistula placement    . Revison of arteriovenous fistula Left 07/16/2014    Procedure: EXCISION ANEURYSMAL AREA OF LEFT ARTERIOVENOUS FISTULA;  Surgeon: Serafina Mitchell, MD;  Location: [facility] OR;  Service: Vascular;  Laterality: Left;   Social History: History  Substance  Use Topics  . Smoking status: Never Smoker   . Smokeless tobacco: Not on file  . Alcohol Use: No   Additional social history: never smoker, no EtOH or illicit drugs.   Please also refer to relevant sections of EMR.  Family History: Family History  Problem Relation Age of Onset  . Cancer Other    Allergies and Medications: Allergies  Allergen Reactions  . Erythromycin Nausea And Vomiting  . Sulfa Antibiotics Nausea And Vomiting    Other reaction(s): ITCHING  . Sulfacetamide Sodium    No current facility-administered medications on file prior to encounter.   Current Outpatient Prescriptions on File Prior to Encounter  Medication Sig Dispense Refill  . acetaminophen (TYLENOL) 325 MG tablet Take 650 mg by mouth every 6 (six) hours as needed for mild pain.    Marland Kitchen amLODipine (NORVASC) 5 MG tablet Take 5 mg by mouth daily.     Marland Kitchen aspirin 81 MG chewable tablet Chew 81 mg by mouth daily.    . brompheniramine-pseudoephedrine (DIMETAPP) 1-15 MG/5ML ELIX Take 5 mLs by mouth 2 (two) times daily as needed for allergies.    Marland Kitchen divalproex (DEPAKOTE) 250 MG EC tablet Take 500 mg by mouth every evening.     . gabapentin (NEURONTIN) 300 MG capsule Take 300 mg by mouth daily.     Marland Kitchen imipramine (TOFRANIL) 25 MG tablet Take 50 mg by mouth at bedtime.     Marland Kitchen omeprazole (PRILOSEC) 20 MG capsule Take 20 mg by mouth daily.      Marland Kitchen oxyCODONE (OXY IR/ROXICODONE) 5 MG immediate release tablet Take 1 tablet (5 mg total) by mouth every 6 (six) hours as needed for severe pain. 30 tablet 0  . predniSONE (DELTASONE) 5 MG tablet Take 5 mg by mouth daily.     . tacrolimus (PROGRAF) 1 MG capsule Take 1 mg by mouth 2 (two) times daily.      Marland Kitchen doxycycline (VIBRA-TABS) 100 MG tablet Take 1 tablet (100 mg total) by mouth 2 (two) times daily. (Patient not taking: Reported on 06/22/2014) 20 tablet 0    Objective: BP 124/80 mmHg  Pulse 107  Temp(Src) 98.9 F (37.2 C) (Oral)  Resp 20  Ht 6' (1.829 m)  Wt 138 lb (62.596 kg)   BMI 18.71 kg/m2  SpO2 97% Exam: General: alert, no distress, diaphoretic  HEENT: EOMI, PERRL, poor dentition, left mobile neck mass  Cardiovascular: tacycardic, s1S2, regular rhythem, CR brisk  Respiratory: CTAB, non labored, no wheezing or crackles  Abdomen: soft, NTND, +BS, no guarding or rigidity  Extremities: left UE in upper arm that progressed to left shoulder is erythematous, tender and warm. large intact surgical incision, no purulence. Ecchymosis distally of incision. Pain with movement. Not actively moving left arm and passive FROM  Skin: no other rashes Neuro: alert and oriented,   Labs and Imaging: CBC BMET   Recent Labs Lab 07/20/14 1611  WBC 14.2*  HGB 12.0*  HCT 34.3*  PLT 245    Recent Labs Lab 07/20/14 1611  NA 133*  K 4.6  CL 96  CO2  24  BUN 15  CREATININE 1.52*  GLUCOSE 104*  CALCIUM 9.6       Rosemarie Ax, MD 07/20/2014, 5:25 PM PGY-2, [facility] pager: [phone], text pages welcome

## 2014-07-20 NOTE — Consult Note (Signed)
  The patient was seen in the emergency department with erythema and pain in his left arm. Had undergone excision of a large aneurysmal upper arm AV fistula with Dr. Trula Slade on 07/16/2014. The patient has a renal transplant and has not been on dialysis for this period of time. He underwent uneventful was resection incision from the antecubital space to mid upper arm. Over the past several days she has been feeling quite poorly with the nausea and vomiting generalized malaise. Also had erythema and pain over his upper arm extending to the shoulder.  Past Medical History  Diagnosis Date  . Hypertension   . Chronic kidney disease     s/p transplant  . Seizures     treated by Dr. Melrose Nakayama  . ADD (attention deficit disorder)   . Tuberous sclerosis   . Immune deficiency disorder   . Headache     Migraines  . GERD (gastroesophageal reflux disease)     History  Substance Use Topics  . Smoking status: Never Smoker   . Smokeless tobacco: Not on file  . Alcohol Use: No    Family History  Problem Relation Age of Onset  . Cancer Other     Allergies  Allergen Reactions  . Erythromycin Nausea And Vomiting  . Sulfa Antibiotics Nausea And Vomiting    Other reaction(s): ITCHING  . Sulfacetamide Sodium     No current facility-administered medications for this encounter.  BP 147/85 mmHg  Pulse 103  Temp(Src) 102.4 F (39.1 C) (Oral)  Resp 18  Ht 6' (1.829 m)  Wt 138 lb (62.596 kg)  BMI 18.71 kg/m2  SpO2 100%  Body mass index is 18.71 kg/(m^2).    On physical exam he has easily palpable 2-3+ left radial pulse. The incision from the antecubital space to mid upper arm is intact with minimal erythema. He has marked erythema from the incision up to the shoulder. On palpation he has easily palpable thrombosed cephalic vein at this area. There is no fluctuance.  White blood cell count is 14,000.  Impression and plan: Suspect that this erythema is related to superficial thrombophlebitis of  the residual cephalic vein above where the aneurysm had been resected. This clot is easily palpable in this area. Certainly concerning with a temperature of 102 and elevated white count. Agree with admission and treatment for infection. This most likely represents severe reaction with thrombophlebitis rather than actual bacterial infection but certainly needs coverage with antibiotics. Will obtain left arm venous duplex tomorrow to rule out deep venous involvement as well as superficial thrombus. Will follow with you.

## 2014-07-20 NOTE — Progress Notes (Signed)
Patient arrived from ED via gurney. Patient transferred to patient bed, patient appears to be resting comfortably, CNA to perform vital signs and was told to notify nurse of any abnormal results. Patient mother is at bedside. Will forward report to oncoming nurse.  Tyler Alvarez n

## 2014-07-21 DIAGNOSIS — Z940 Kidney transplant status: Secondary | ICD-10-CM | POA: Insufficient documentation

## 2014-07-21 DIAGNOSIS — T8149XA Infection following a procedure, other surgical site, initial encounter: Secondary | ICD-10-CM | POA: Insufficient documentation

## 2014-07-21 DIAGNOSIS — Q851 Tuberous sclerosis: Secondary | ICD-10-CM

## 2014-07-21 DIAGNOSIS — L0211 Cutaneous abscess of neck: Secondary | ICD-10-CM

## 2014-07-21 DIAGNOSIS — T814XXA Infection following a procedure, initial encounter: Secondary | ICD-10-CM

## 2014-07-21 LAB — BASIC METABOLIC PANEL
Anion gap: 9 (ref 5–15)
BUN: 17 mg/dL (ref 6–23)
CHLORIDE: 102 mmol/L (ref 96–112)
CO2: 22 mmol/L (ref 19–32)
Calcium: 8.7 mg/dL (ref 8.4–10.5)
Creatinine, Ser: 1.53 mg/dL — ABNORMAL HIGH (ref 0.50–1.35)
GFR calc non Af Amer: 55 mL/min — ABNORMAL LOW (ref >90)
GFR, EST AFRICAN AMERICAN: 64 mL/min — AB (ref >90)
Glucose, Bld: 131 mg/dL — ABNORMAL HIGH (ref 70–99)
POTASSIUM: 4.5 mmol/L (ref 3.5–5.1)
Sodium: 133 mmol/L — ABNORMAL LOW (ref 135–145)

## 2014-07-21 LAB — CBC
HCT: 29.5 % — ABNORMAL LOW (ref 39.0–52.0)
Hemoglobin: 10.3 g/dL — ABNORMAL LOW (ref 13.0–17.0)
MCH: 30.4 pg (ref 26.0–34.0)
MCHC: 34.9 g/dL (ref 30.0–36.0)
MCV: 87 fL (ref 78.0–100.0)
PLATELETS: 226 10*3/uL (ref 150–400)
RBC: 3.39 MIL/uL — ABNORMAL LOW (ref 4.22–5.81)
RDW: 12.7 % (ref 11.5–15.5)
WBC: 12.2 10*3/uL — ABNORMAL HIGH (ref 4.0–10.5)

## 2014-07-21 LAB — URINE CULTURE
COLONY COUNT: NO GROWTH
CULTURE: NO GROWTH

## 2014-07-21 LAB — TROPONIN I: Troponin I: <0.03 ng/mL (ref <0.031)

## 2014-07-21 LAB — HEPARIN LEVEL (UNFRACTIONATED): HEPARIN UNFRACTIONATED: 0.16 [IU]/mL — AB (ref 0.30–0.70)

## 2014-07-21 LAB — HIV ANTIBODY (ROUTINE TESTING W REFLEX): HIV Screen 4th Generation wRfx: NONREACTIVE

## 2014-07-21 MED ORDER — GABAPENTIN 300 MG PO CAPS: 300.0000 mg | ORAL_CAPSULE | Freq: Every day | ORAL | Status: DC

## 2014-07-21 MED ORDER — HEPARIN BOLUS VIA INFUSION
2500.0000 [IU] | Freq: Once | INTRAVENOUS | Status: AC
  Administered 2014-07-21: 2500 [IU] via INTRAVENOUS
  Filled 2014-07-21: qty 2500

## 2014-07-21 MED ORDER — HEPARIN (PORCINE) IN NACL 100-0.45 UNIT/ML-% IJ SOLN
1250.0000 [IU]/h | INTRAMUSCULAR | Status: DC
  Administered 2014-07-21: 900 [IU]/h via INTRAVENOUS
  Administered 2014-07-22: 1050 [IU]/h via INTRAVENOUS
  Filled 2014-07-21 (×5): qty 250

## 2014-07-21 MED ORDER — TACROLIMUS 1 MG PO CAPS
2.0000 mg | ORAL_CAPSULE | Freq: Two times a day (BID) | ORAL | Status: DC
  Administered 2014-07-21 – 2014-07-27 (×12): 2 mg via ORAL
  Filled 2014-07-21 (×14): qty 2

## 2014-07-21 MED ORDER — HEPARIN BOLUS VIA INFUSION
2000.0000 [IU] | Freq: Once | INTRAVENOUS | Status: AC
  Administered 2014-07-21: 2000 [IU] via INTRAVENOUS
  Filled 2014-07-21: qty 2000

## 2014-07-21 MED ORDER — GABAPENTIN 300 MG PO CAPS
300.0000 mg | ORAL_CAPSULE | Freq: Every day | ORAL | Status: DC
  Administered 2014-07-21 – 2014-07-26 (×6): 300 mg via ORAL
  Filled 2014-07-21 (×8): qty 1

## 2014-07-21 NOTE — Progress Notes (Signed)
ANTICOAGULATION CONSULT NOTE - Initial Consult  Pharmacy Consult for Heparin Indication: New acute LUE DVT  Allergies  Allergen Reactions  . Erythromycin Nausea And Vomiting  . Sulfa Antibiotics Nausea And Vomiting    Other reaction(s): ITCHING  . Sulfacetamide Sodium     Patient Measurements: Height: 6' (182.9 cm) Weight: 137 lb 8 oz (62.37 kg) IBW/kg (Calculated) : 77.6 Heparin Dosing Weight: 62.4 kg  Vital Signs: Temp: 99.3 F (37.4 C) (03/08 0940) Temp Source: Oral (03/08 0940) BP: 130/77 mmHg (03/08 0940) Pulse Rate: 93 (03/08 0940)  Labs:  Recent Labs  07/20/14 1611 07/20/14 2000 07/21/14 0137  HGB 12.0*  --  10.3*  HCT 34.3*  --  29.5*  PLT 245  --  226  CREATININE 1.52*  --  1.53*  TROPONINI  --  <0.03 <0.03    Estimated Creatinine Clearance: 56.6 mL/min (by C-G formula based on Cr of 1.53).   Medical History: Past Medical History  Diagnosis Date  . Hypertension   . Chronic kidney disease     s/p transplant  . Seizures     treated by Dr. Melrose Nakayama  . ADD (attention deficit disorder)   . Tuberous sclerosis   . Immune deficiency disorder   . Headache     Migraines  . GERD (gastroesophageal reflux disease)     Assessment: 35 YOM who presented on 07/20/14 with LUE swelling/pain and CP after a recent AVF revision on 3/3. The patient was started on Vancomycin for empiric cellulitis coverage. Today, dopplers revealed a new acute LUE DVT and pharmacy was consulted to start heparin with a bolus.  Since the patient received a dose of Hep SQ this AM - will reduce the bolus slightly as not to over-anticoagulate the patient initially. Given the patient's SOB - it is likely a CTA will also need to be performed to r/o PE. Hgb/Hct 10.3/29.5, plts wnl.  Goal of Therapy:  Heparin level 0.3-0.7 units/ml Monitor platelets by anticoagulation protocol: Yes   Plan:  1. Heparin 2500 unit bolus x 1  2. Initiate the heparin drip at a rate of 900 units/hr (9 ml/hr) 3.  Will continue to monitor for any signs/symptoms of bleeding and will follow up with heparin level in 6 hours   Alycia Rossetti, PharmD, BCPS Clinical Pharmacist Pager: [phone] 07/21/2014 1:33 PM

## 2014-07-21 NOTE — Discharge Summary (Signed)
[facility] Discharge Summary   Patient ID:  Tyler Alvarez MRN: [id number] DOB/AGE: 1975-05-23 39 y.o.  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 Hypertension   . Chronic kidney disease     s/p transplant  . Seizures     treated by Dr. Melrose Nakayama  . ADD (attention deficit disorder)   . Tuberous sclerosis   . Immune deficiency disorder   . Headache     Migraines  . GERD (gastroesophageal reflux disease)     Procedure(s): EXCISION ANEURYSMAL AREA OF LEFT ARTERIOVENOUS FISTULA  Discharged Condition: good  HPI:  This is a 39 y.o. 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 He had a left radiocephalic AVF placed, which was not successful. He subsequently underwent a left brachiocephalic AVF in 9417/4081. The pt did receive a kidney transplantn 2001 and has been off HD since that time. He is referred here for his enlarging left arm AVF for ligation.   Hospital Course:  Tyler Alvarez is a 39 y.o. male is S/P  Procedure(s): EXCISION ANEURYSMAL AREA OF LEFT ARTERIOVENOUS FISTULA Non eventful stay for observation.  His Cr was 1.5 prior to discharge.  He was ambulating, taking PO's well and voided.  He was discharge home in stable condition.   Significant Diagnostic Studies: CBC Lab Results  Component Value Date   WBC 12.2* 07/21/2014   HGB 10.3* 07/21/2014   HCT 29.5* 07/21/2014   MCV 87.0 07/21/2014   PLT 226 07/21/2014    BMET    Component Value Date/Time   NA 133* 07/21/2014 0137   K 4.5 07/21/2014 0137   CL 102 07/21/2014 0137   CO2 22 07/21/2014 0137   GLUCOSE 131* 07/21/2014 0137   BUN 17  07/21/2014 0137   CREATININE 1.53* 07/21/2014 0137   CALCIUM 8.7 07/21/2014 0137   GFRNONAA 55* 07/21/2014 0137   GFRAA 64* 07/21/2014 0137   COAG Lab Results  Component Value Date   INR 1.09 07/16/2014     Disposition:  Discharge to :Home Discharge Instructions    Call MD for:  redness, tenderness, or signs of infection (pain, swelling, bleeding, redness, odor or green/yellow discharge around incision site)    Complete by:  As directed      Call MD for:  severe or increased pain, loss or decreased feeling  in affected limb(s)    Complete by:  As directed      Call MD for:  temperature >100.5    Complete by:  As directed      Discharge instructions    Complete by:  As directed   You may shower in 24 hours.  Wrap arm with ace from fingers to shoulder for compression and help with swelling.  Elevate your hand above your elbow.  Actively move your fingers and make a fist on and off to pump swelling out of your hand.     Discharge patient    Complete by:  As directed   Discharge pt to home     Driving Restrictions    Complete by:  As directed   No driving until you are off pain medication     Lifting restrictions  Complete by:  As directed   No lifting for 6 weeks using the left upper extreimty     Resume previous diet    Complete by:  As directed             Medication List    TAKE these medications        acetaminophen 325 MG tablet  Commonly known as:  TYLENOL  Take 650 mg by mouth every 6 (six) hours as needed for mild pain.     amLODipine 5 MG tablet  Commonly known as:  NORVASC  Take 5 mg by mouth daily.     aspirin 81 MG chewable tablet  Chew 81 mg by mouth daily.     brompheniramine-pseudoephedrine 1-15 MG/5ML Elix  Commonly known as:  DIMETAPP  Take 5 mLs by mouth 2 (two) times daily as needed for allergies.     divalproex 250 MG DR tablet  Commonly known as:  DEPAKOTE  Take 500 mg by mouth every evening.     doxycycline 100 MG tablet  Commonly  known as:  VIBRA-TABS  Take 1 tablet (100 mg total) by mouth 2 (two) times daily.     gabapentin 300 MG capsule  Commonly known as:  NEURONTIN  Take 300 mg by mouth daily.     imipramine 25 MG tablet  Commonly known as:  TOFRANIL  Take 50 mg by mouth at bedtime.     omeprazole 20 MG capsule  Commonly known as:  PRILOSEC  Take 20 mg by mouth daily.     oxyCODONE 5 MG immediate release tablet  Commonly known as:  Oxy IR/ROXICODONE  Take 1 tablet (5 mg total) by mouth every 6 (six) hours as needed for severe pain.     predniSONE 5 MG tablet  Commonly known as:  DELTASONE  Take 5 mg by mouth daily.     tacrolimus 1 MG capsule  Commonly known as:  PROGRAF  Take 1 mg by mouth 2 (two) times daily.       Verbal and written Discharge instructions given to the patient. Wound care per Discharge AVS F/U with Dr. Trula Slade in 2 weeks Signed: Laurence Slate [facility] 07/21/2014, 1:09 PM

## 2014-07-21 NOTE — Progress Notes (Signed)
ANTICOAGULATION CONSULT NOTE - Initial Consult  Pharmacy Consult for Heparin Indication: New acute LUE DVT  Allergies  Allergen Reactions  . Erythromycin Nausea And Vomiting  . Sulfa Antibiotics Nausea And Vomiting    Other reaction(s): ITCHING  . Sulfacetamide Sodium     Patient Measurements: Height: 6' (182.9 cm) Weight: 138 lb 1.6 oz (62.642 kg) IBW/kg (Calculated) : 77.6 Heparin Dosing Weight: 62.4 kg  Vital Signs: Temp: 101.5 F (38.6 C) (03/08 2104) Temp Source: Oral (03/08 2104) BP: 128/80 mmHg (03/08 2104) Pulse Rate: 91 (03/08 2104)  Labs:  Recent Labs  07/20/14 1611 07/20/14 2000 07/21/14 0137 07/21/14 1940  HGB 12.0*  --  10.3*  --   HCT 34.3*  --  29.5*  --   PLT 245  --  226  --   HEPARINUNFRC  --   --   --  0.16*  CREATININE 1.52*  --  1.53*  --   TROPONINI  --  <0.03 <0.03  --     Estimated Creatinine Clearance: 56.8 mL/min (by C-G formula based on Cr of 1.53).   Medical History: Past Medical History  Diagnosis Date  . Hypertension   . Chronic kidney disease     s/p transplant  . Seizures     treated by Dr. Melrose Nakayama  . ADD (attention deficit disorder)   . Tuberous sclerosis   . Immune deficiency disorder   . Headache     Migraines  . GERD (gastroesophageal reflux disease)     Assessment: 21 YOM who presented on 07/20/14 with LUE swelling/pain and CP after a recent AVF revision on 3/3. The patient was started on Vancomycin for empiric cellulitis coverage. Today, dopplers revealed a new acute LUE DVT and pharmacy was consulted to start heparin with a bolus. Initial heparin level low at 0.16 units/mL. Per RN, no issues with line since starting her shift, and did not receive report that the drip was held for any reason today. No bleeding noted.  Goal of Therapy:  Heparin level 0.3-0.7 units/ml Monitor platelets by anticoagulation protocol: Yes   Plan:  -rebolus with 2000 units IV x1, then increase heparin drip to 1050 units/hr  (10.34mL/hr) -heparin level in 6 hours  -daily heparin level and CBC -follow for s/s bleeding  Nakenya Theall D. Amoy Steeves, PharmD, BCPS Clinical Pharmacist Pager: [phone] 07/21/2014 9:10 PM

## 2014-07-21 NOTE — Progress Notes (Signed)
Family Medicine Teaching Service Daily Progress Note Intern Pager: [phone]  Patient name: Tyler Alvarez Medical record number: [id number] Date of birth: June 15, 1975 Age: 39 y.o. Gender: male  Primary Care Provider: Conni Slipper, MD Consultants: Vascular Surgery Code Status: Full  Pt Overview and Major Events to Date:  3/7-3/8: Admitted with concern for thrombophlebitis vs. Cellulitis of left arm incision site.   Assessment and Plan: Tyler Alvarez is a 39 y.o. male presenting with left upper extremity cellulitis vs. thrombophlebitis with recent excision of large aneurysmal upper arm AV fistula. 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   #Cellulitis/Superficial thrombophlebitis: Recent excision of large aneurysmal upper AV fistula on 07/16/14. Presents with erythema, pain and fever 102 today and WBC 14.Marland Kitchen He is on chronic immunosuppression for kidney transplant.  - admit to family medicine, Dr. Mingo Amber attending.  - Vascular surgery consulting > palpable thrombosis considering thrombophlebitis but need to cover for cellulitis.  - Venous duplex with DVT of left subclavian and superficial thrombosis as well.  - Treating with Heparin Gtt for now.  - Blood cultures, urine Cx pending.  - tylenol for fever PRN  - Vancomycin per pharmacy given evidence of cellulitis in immunosuppression. Transition to PO once afebrile and showing improvement > 24 hours.   #ESRD s/p transplant: making urine and no suggestion of rejection. Spoke with renal (Dr. Moshe Cipro) about continue medication in light of infection. Will continue meds for now.  - continue prednisone and prograf.  - Creatinine 1.61 > 1.53 today.  - GFR stage IIIA - Protein on U/A. Will likely need repeat as an outpatient.   #Chest pain: reporting some chest pain but patient is not a good  historian. EKG showing sinus tachycardia. ACS ruled out. HEART 2 at most.  - troponin negative.  - EKG with sinus tachycardia.  - telemetry   #Tuberous Sclerosis: led to his kidney failure.   #Left neck mass: reports that he has been followed by ENT as an outpatient. States that it is a result of his Tuberous sclerosis. No problems breathing or swallowing. Previously with an abscess in the area that was I&D.   #HTN: controlled  - continue medications   #Seizure d/o: unsure of last seizure  - will continue depakote 500qhs.   FEN/GI: regular diet  Prophylaxis: subQ hep   Disposition: Home once improving.   Subjective:  No acute events overnight. No nausea / vomiting. Improvement in tenderness, though some increased swelling of his left arm. No pain with clenching his fist. No cramping. Endorses tenderness predominantly around the proximal edge of his incision.   Objective: Temp:  [98.9 F (37.2 C)-102.4 F (39.1 C)] 99.6 F (37.6 C) (03/08 0515) Pulse Rate:  [94-107] 99 (03/08 0515) Resp:  [14-22] 20 (03/08 0515) BP: (124-149)/(80-92) 136/81 mmHg (03/08 0515) SpO2:  [97 %-100 %] 100 % (03/08 0515) Weight:  [137 lb 8 oz (62.37 kg)-138 lb (62.596 kg)] 137 lb 8 oz (62.37 kg) (03/07 2107) Physical Exam: General: NAD, Resting comfortably  Cardiovascular: RRR, NO MGR, Normal S1/S2, 2+ distal pulses  Respiratory: CTA Bilaterally, Unlabored, Appropriate rate Abdomen: S, Mild TTP in LLQ,+BS, No organomegaly  Extremities: WWP, LUE with mild swelling of his hand and forearm. TTP predominantly over his left upper arm, left shoulder, and under arm. He has a well-healing incision over his previous graft site, dried blood, but no erythema or drainage. Less  tenderness around the incision site than over his proximal arm.  Skin: left neck known tuber / growth, nontender to palpation. Otherwise no rashes / lesions Neuro: AAOx3, no focal deficits at this time.   Laboratory:  Recent  Labs Lab 07/16/14 0952 07/20/14 1611 07/21/14 0137  WBC  --  14.2* 12.2*  HGB 12.9* 12.0* 10.3*  HCT 38.0* 34.3* 29.5*  PLT  --  245 226    Recent Labs Lab 07/16/14 1710 07/17/14 1000 07/20/14 1611 07/21/14 0137  NA 137 137 133* 133*  K 3.9 4.0 4.6 4.5  CL 106 106 96 102  CO2 26 24 24 22   BUN 21 13 15 17   CREATININE 1.61* 1.55* 1.52* 1.53*  CALCIUM 8.8 8.9 9.6 8.7  PROT 6.0  --   --   --   BILITOT 0.7  --   --   --   ALKPHOS 45  --   --   --   ALT 15  --   --   --   AST 16  --   --   --   GLUCOSE 102* 115* 104* 131*   EKG - NSR  Troponin - Neg. x3  Imaging/Diagnostic Tests: LUE Venous Duplex 3/8 : DVT of left subclavian and left superficial vein thrombosis.   Aquilla Hacker, MD 07/21/2014, 8:44 AM PGY-1, [place] Intern pager: [phone], text pages welcome

## 2014-07-21 NOTE — Progress Notes (Signed)
Left upper extremity venous duplex completed.  Left:  DVT noted in subclavian vein with superficial thrombosis in the left proximal cephalic vein.  Right:  Negative for DVT in the right subclavian vein.

## 2014-07-21 NOTE — Progress Notes (Signed)
  Progress Note    07/21/2014 10:35 AM   Subjective:  C/o soreness in his thumb and 1st left finger  Tm 102.4 last evening 7pm now 99.6  ABx:   Filed Vitals:   07/21/14 0515  BP: 136/81  Pulse: 99  Temp: 99.6 F (37.6 C)  Resp: 20    Physical Exam: Extremities:  Erythema LUE-it has not extended past purple line drawn yesterday.  He does have some swelling of the LUE.   HENT:  Left neck mass purple in color, which looks like the purple ink from line drawn on skin.   CBC    Component Value Date/Time   WBC 12.2* 07/21/2014 0137   RBC 3.39* 07/21/2014 0137   HGB 10.3* 07/21/2014 0137   HCT 29.5* 07/21/2014 0137   PLT 226 07/21/2014 0137   MCV 87.0 07/21/2014 0137   MCH 30.4 07/21/2014 0137   MCHC 34.9 07/21/2014 0137   RDW 12.7 07/21/2014 0137   LYMPHSABS 2.1 02/15/2010 1657   MONOABS 0.6 02/15/2010 1657   EOSABS 0.5 02/15/2010 1657   BASOSABS 0.0 02/15/2010 1657    BMET    Component Value Date/Time   NA 133* 07/21/2014 0137   K 4.5 07/21/2014 0137   CL 102 07/21/2014 0137   CO2 22 07/21/2014 0137   GLUCOSE 131* 07/21/2014 0137   BUN 17 07/21/2014 0137   CREATININE 1.53* 07/21/2014 0137   CALCIUM 8.7 07/21/2014 0137   GFRNONAA 55* 07/21/2014 0137   GFRAA 64* 07/21/2014 0137    INR    Component Value Date/Time   INR 1.09 07/16/2014 1710     Intake/Output Summary (Last 24 hours) at 07/21/14 1035 Last data filed at 07/21/14 0603  Gross per 24 hour  Intake 1201.67 ml  Output    675 ml  Net 526.67 ml     Assessment/Plan:  39 y.o. male is s/p:  Resection of left BC AVF aneurysm 07/16/14 readmitted with superficial thrombophlebitis    -erythema does not appear to have worsened since yesterday -leukocytosis improved-still with fever, but improved -blood cultures are pending -some swelling LUE-will elevate on an additional pillow -left neck mass purple in color, but looks like pt had laid his head over to the left and the purple ink bled onto his  neck. -awaiting venous duplex -continue ABx -Dr. Trula Slade to see pt later this morning.    Tyler Locket, PA-C [facility] [phone] 07/21/2014 10:35 AM    I have seen and examined the patient and discussed his plan with his mom.  He has thrombophlebitis from the thrombosed remaining segment of his L UE AVF.  U/S today show DVT in to the subclavian vein.  I have started him on heparin with bolus and continuous infusion.  He is on Abx given his fevers.  I have encouraged him to keep his arm elevated.  I will keep him NPO after midnight incase he needs to have the residual portion of his AVF resected.   Tyler Alvarez

## 2014-07-22 DIAGNOSIS — T8172XA Complication of vein following a procedure, not elsewhere classified, initial encounter: Secondary | ICD-10-CM | POA: Insufficient documentation

## 2014-07-22 DIAGNOSIS — T8172XD Complication of vein following a procedure, not elsewhere classified, subsequent encounter: Secondary | ICD-10-CM

## 2014-07-22 LAB — CBC
HEMATOCRIT: 28.6 % — AB (ref 39.0–52.0)
HEMOGLOBIN: 10 g/dL — AB (ref 13.0–17.0)
MCH: 30.5 pg (ref 26.0–34.0)
MCHC: 35 g/dL (ref 30.0–36.0)
MCV: 87.2 fL (ref 78.0–100.0)
PLATELETS: 228 10*3/uL (ref 150–400)
RBC: 3.28 MIL/uL — ABNORMAL LOW (ref 4.22–5.81)
RDW: 12.9 % (ref 11.5–15.5)
WBC: 9.9 10*3/uL (ref 4.0–10.5)

## 2014-07-22 LAB — BASIC METABOLIC PANEL
Anion gap: 6 (ref 5–15)
BUN: 12 mg/dL (ref 6–23)
CALCIUM: 8.7 mg/dL (ref 8.4–10.5)
CO2: 24 mmol/L (ref 19–32)
Chloride: 107 mmol/L (ref 96–112)
Creatinine, Ser: 1.44 mg/dL — ABNORMAL HIGH (ref 0.50–1.35)
GFR, EST AFRICAN AMERICAN: 69 mL/min — AB (ref >90)
GFR, EST NON AFRICAN AMERICAN: 60 mL/min — AB (ref >90)
GLUCOSE: 125 mg/dL — AB (ref 70–99)
POTASSIUM: 3.6 mmol/L (ref 3.5–5.1)
Sodium: 137 mmol/L (ref 135–145)

## 2014-07-22 LAB — HEPARIN LEVEL (UNFRACTIONATED)
HEPARIN UNFRACTIONATED: 0.32 [IU]/mL (ref 0.30–0.70)
Heparin Unfractionated: 0.16 IU/mL — ABNORMAL LOW (ref 0.30–0.70)

## 2014-07-22 MED ORDER — HEPARIN BOLUS VIA INFUSION
2000.0000 [IU] | Freq: Once | INTRAVENOUS | Status: AC
  Administered 2014-07-22: 2000 [IU] via INTRAVENOUS
  Filled 2014-07-22: qty 2000

## 2014-07-22 NOTE — Progress Notes (Signed)
ANTICOAGULATION CONSULT NOTE - Follow Up Consult  Pharmacy Consult for Heparin Indication: New acute LUE DVT  Allergies  Allergen Reactions  . Erythromycin Nausea And Vomiting  . Sulfa Antibiotics Nausea And Vomiting    Other reaction(s): ITCHING  . Sulfacetamide Sodium     Patient Measurements: Height: 6' (182.9 cm) Weight: 138 lb 1.6 oz (62.642 kg) IBW/kg (Calculated) : 77.6 Heparin Dosing Weight: 62.6 kg  Vital Signs: Temp: 99 F (37.2 C) (03/09 0434) Temp Source: Oral (03/09 0434) BP: 146/79 mmHg (03/09 0434) Pulse Rate: 86 (03/09 0434)  Labs:  Recent Labs  07/20/14 1611 07/20/14 2000 07/21/14 0137 07/21/14 1940 07/22/14 0615 07/22/14 0654  HGB 12.0*  --  10.3*  --  10.0*  --   HCT 34.3*  --  29.5*  --  28.6*  --   PLT 245  --  226  --  228  --   HEPARINUNFRC  --   --   --  0.16*  --  0.32  CREATININE 1.52*  --  1.53*  --   --   --   TROPONINI  --  <0.03 <0.03  --   --   --     Estimated Creatinine Clearance: 56.8 mL/min (by C-G formula based on Cr of 1.53).   Medications:  Heparin @ 1050 units/hr  Assessment: [name] who presented on 07/20/14 with LUE swelling/pain and CP after a recent AVF revision on 3/3. The patient was started on Vancomycin for empiric cellulitis coverage. On 3/8, dopplers revealed a new acute LUE DVT and pharmacy was consulted to start heparin with a bolus.  A heparin level this morning is therapeutic after a rate increase yesterday evening (HL 0.32 << 0.16, goal of 0.3-0.7). CBC stable - no overt s/sx of bleeding noted per RN report.  Goal of Therapy:  Heparin level 0.3-0.7 units/ml Monitor platelets by anticoagulation protocol: Yes   Plan:  1. Increase the heparin drip rate slightly to 1100 units/hr (11 ml/hr) 2. Will continue to monitor for any signs/symptoms of bleeding and will follow up with heparin level in 6 hours to confirm therapeutic  Alycia Rossetti, PharmD, BCPS Clinical Pharmacist Pager: [phone] 07/22/2014 8:41 AM

## 2014-07-22 NOTE — Progress Notes (Signed)
Family Medicine Teaching Service Daily Progress Note Intern Pager: [phone]  Patient name: Tyler Alvarez Medical record number: [id number] Date of birth: 1976/02/27 Age: 39 y.o. Gender: male  Primary Care Provider: Conni Slipper, MD Consultants: Vascular Surgery Code Status: Full  Pt Overview and Major Events to Date:  3/7-3/8: Admitted with concern for thrombophlebitis vs. Cellulitis of left arm incision site.  3/9 - Continues to be intermittently febrile, but slowly improving. DVT of left subclavian.   Assessment and Plan: Tyler Alvarez is a 39 y.o. male presenting with left upper extremity cellulitis vs. thrombophlebitis with recent excision of large aneurysmal upper arm AV fistula. 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   #Cellulitis/Superficial thrombophlebitis: Recent excision of large aneurysmal upper AV fistula on 07/16/14. Presents with erythema, pain and fever 102 today and WBC 14.Marland Kitchen He is on chronic immunosuppression for kidney transplant.  - admit to family medicine, Dr. Mingo Amber attending.  - Vascular surgery consulting > May require surgery. Continue current management for now.  - NPO after MN just in case.  - Venous duplex with DVT of left subclavian and superficial thrombosis as well.  - Treating with Heparin Gtt for now.  - Blood cultures NGTD, urine Cx Negative.   - tylenol for fever PRN  - Watching vitals closely given known DVT with potential for PE. On Heparin GTT as above.  - Vancomycin per pharmacy given evidence of cellulitis in immunosuppression. Transition to PO once afebrile and showing improvement > 24 hours.    #ESRD s/p transplant: making urine and no suggestion of rejection. Spoke with renal (Dr. Moshe Cipro) about continue medication in light of infection. Will continue meds for now.  - continue  prednisone and prograf.  - Creatinine 1.61 > 1.53 today.  - GFR stage IIIA - Protein on U/A. Will likely need repeat as an outpatient.   #Chest pain: reporting some chest pain but patient is not a good historian. EKG showing sinus tachycardia. ACS ruled out. HEART 2 at most.  - troponin negative.  - EKG with sinus tachycardia.  - telemetry   #Tuberous Sclerosis: led to his kidney failure and likely seizure d/o.   #Left neck mass: reports that he has been followed by ENT as an outpatient. States that it is a result of his Tuberous sclerosis. No problems breathing or swallowing. Previously with an abscess in the area that was I&D.  - Previously seen by ENT, but elected not to have removed.   #HTN: controlled  - continue medications   #Seizure d/o: unsure of last seizure  - will continue depakote 500qhs.   FEN/GI: regular diet  Prophylaxis: subQ hep   Disposition: Home once improving.   Subjective:  Febrile once overnight. Feels that his swelling, redness, and pain are improving. Does endorse some SOB that was transient. No nausea, vomiting. He is hungry this am and does want breakfast.   Objective: Temp:  [99 F (37.2 C)-101.5 F (38.6 C)] 99 F (37.2 C) (03/09 0434) Pulse Rate:  [86-91] 86 (03/09 0434) Resp:  [17-20] 17 (03/09 0434) BP: (128-146)/(79-80) 146/79 mmHg (03/09 0434) SpO2:  [98 %-100 %] 98 % (03/09 0434) Weight:  [138 lb 1.6 oz (62.642 kg)] 138 lb 1.6 oz (62.642 kg) (03/08 2104) Physical Exam: General: NAD, Resting comfortably  Cardiovascular: RRR, NO MGR, Normal S1/S2, 2+ distal pulses  Respiratory: CTA Bilaterally, Unlabored, Appropriate rate  Abdomen:  S, Nontender, +BS, No organomegaly  Extremities: Erythema and swelling is improving. Erythema continues to retreat from the outlined area, swelling much improved, still some tenderness with grip of the forearm, 2+ distal pulses. Diffuse TTP of the left upper extremity. Improving overall.   Skin: left neck  known tuber / growth, nontender to palpation. Otherwise no rashes / lesions Neuro: AAOx3, no focal deficits at this time.   Laboratory:  Recent Labs Lab 07/20/14 1611 07/21/14 0137 07/22/14 0615  WBC 14.2* 12.2* 9.9  HGB 12.0* 10.3* 10.0*  HCT 34.3* 29.5* 28.6*  PLT 245 226 228    Recent Labs Lab 07/16/14 1710  07/20/14 1611 07/21/14 0137 07/22/14 1029  NA 137  < > 133* 133* 137  K 3.9  < > 4.6 4.5 3.6  CL 106  < > 96 102 107  CO2 26  < > 24 22 24   BUN 21  < > 15 17 12   CREATININE 1.61*  < > 1.52* 1.53* 1.44*  CALCIUM 8.8  < > 9.6 8.7 8.7  PROT 6.0  --   --   --   --   BILITOT 0.7  --   --   --   --   ALKPHOS 45  --   --   --   --   ALT 15  --   --   --   --   AST 16  --   --   --   --   GLUCOSE 102*  < > 104* 131* 125*  < > = values in this interval not displayed. EKG - NSR  Troponin - Neg. x3  Imaging/Diagnostic Tests: LUE Venous Duplex 3/8 : DVT of left subclavian and left superficial vein thrombosis.   Aquilla Hacker, MD 07/22/2014, 12:17 PM PGY-1, [place] Intern pager: [phone], text pages welcome

## 2014-07-22 NOTE — Progress Notes (Signed)
[facility] [place]  Subjective  - Feeling a little better.  He wants to drink something.   Objective 146/79 86 99 F (37.2 C) (Oral) 17 98%  Intake/Output Summary (Last 24 hours) at 07/22/14 0109 Last data filed at 07/22/14 0600  Gross per 24 hour  Intake 3025.6 ml  Output   1445 ml  Net 1580.6 ml    Left arm decreased erythema  Edema superior to incision with minimal erythema Distal left hand grossly N/V/M intact no sign of steal.   Assessment/Planning: Resection of left BC AVF aneurysm 07/16/14 readmitted with superficial thrombophlebitis   Tm 99, WBC 9.9 WNL Culture final pending no growth to date Continue antibiotics Thrombophlebitis from the thrombosed remaining segment of his L UE AVF. U/S today show DVT in to the subclavian vein.   Decreased edema  Continue Heparin   Tyler Alvarez, Tyler Alvarez 07/22/2014 9:22 AM --  Laboratory Lab Results:  Recent Labs  07/21/14 0137 07/22/14 0615  WBC 12.2* 9.9  HGB 10.3* 10.0*  HCT 29.5* 28.6*  PLT 226 228   BMET  Recent Labs  07/20/14 1611 07/21/14 0137  NA 133* 133*  K 4.6 4.5  CL 96 102  CO2 24 22  GLUCOSE 104* 131*  BUN 15 17  CREATININE 1.52* 1.53*  CALCIUM 9.6 8.7    COAG Lab Results  Component Value Date   INR 1.09 07/16/2014   No results found for: PTT    Agree with the above Erythema may be slightly improved.  Still with swelling but better with elevation WBC normalized Fevers over night  Blood Cx still pending  Will continue to observe to see if he improves with arm elevation, IV abx, and heparin.  If he continues to have fevers and if the erythema gets worse, may require excision of the remaining fistula.  Will make NPO after midnight just in case.   [name]

## 2014-07-22 NOTE — Plan of Care (Signed)
Problem: Phase I Progression Outcomes Goal: Wound assessment- dressing change as appropriate Outcome: Completed/Met Date Met:  07/22/14 Incision is open to air

## 2014-07-22 NOTE — Progress Notes (Addendum)
ANTICOAGULATION CONSULT NOTE - Follow Up Consult  Pharmacy Consult for Heparin Indication: New acute LUE DVT  Allergies  Allergen Reactions  . Erythromycin Nausea And Vomiting  . Sulfa Antibiotics Nausea And Vomiting    Other reaction(s): ITCHING  . Sulfacetamide Sodium     Patient Measurements: Height: 6' (182.9 cm) Weight: 138 lb 1.6 oz (62.642 kg) IBW/kg (Calculated) : 77.6 Heparin Dosing Weight: 62.6 kg  Vital Signs:    Labs:  Recent Labs  07/20/14 1611 07/20/14 2000 07/21/14 0137 07/21/14 1940 07/22/14 0615 07/22/14 0654 07/22/14 1029 07/22/14 1555  HGB 12.0*  --  10.3*  --  10.0*  --   --   --   HCT 34.3*  --  29.5*  --  28.6*  --   --   --   PLT 245  --  226  --  228  --   --   --   HEPARINUNFRC  --   --   --  0.16*  --  0.32  --  0.16*  CREATININE 1.52*  --  1.53*  --   --   --  1.44*  --   TROPONINI  --  <0.03 <0.03  --   --   --   --   --     Estimated Creatinine Clearance: 60.4 mL/min (by C-G formula based on Cr of 1.44).   Medications:  Heparin @ 1100 units/hr  Assessment: 79 YOM who presented on 07/20/14 with LUE swelling/pain and CP after a recent AVF revision on 3/3. The patient was started on Vancomycin for empiric cellulitis coverage. On 3/8, dopplers revealed a new acute LUE DVT and pharmacy was consulted to start heparin with a bolus.  His heparin level this morning was just in therapeutic range at 0.32units/mL, and a slight increase was ordered. However, this afternoon's level dropped below goal to 0.16 units/mL. Per [employer], no issues with line all day, running at appropriate rate. No bleeding noted.  Goal of Therapy:  Heparin level 0.3-0.7 units/ml Monitor platelets by anticoagulation protocol: Yes   Plan:  - heparin bolus with 2000 units IV x1 - Increase the heparin drip rate again to 1250 units/hr (12.76ml/hr) - heparin level in 6 hours -Will continue to monitor for any signs/symptoms of bleeding - daily HL and CBC  Reshawn Ostlund D.  Jahniyah Revere, PharmD, BCPS Clinical Pharmacist Pager: [phone] 07/22/2014 4:58 PM

## 2014-07-23 DIAGNOSIS — T814XXD Infection following a procedure, subsequent encounter: Secondary | ICD-10-CM

## 2014-07-23 LAB — CBC
HCT: 27 % — ABNORMAL LOW (ref 39.0–52.0)
Hemoglobin: 9.1 g/dL — ABNORMAL LOW (ref 13.0–17.0)
MCH: 30.1 pg (ref 26.0–34.0)
MCHC: 33.7 g/dL (ref 30.0–36.0)
MCV: 89.4 fL (ref 78.0–100.0)
Platelets: 239 10*3/uL (ref 150–400)
RBC: 3.02 MIL/uL — AB (ref 4.22–5.81)
RDW: 12.7 % (ref 11.5–15.5)
WBC: 8.1 10*3/uL (ref 4.0–10.5)

## 2014-07-23 LAB — BASIC METABOLIC PANEL
ANION GAP: 7 (ref 5–15)
BUN: 10 mg/dL (ref 6–23)
CHLORIDE: 109 mmol/L (ref 96–112)
CO2: 24 mmol/L (ref 19–32)
Calcium: 8.6 mg/dL (ref 8.4–10.5)
Creatinine, Ser: 1.33 mg/dL (ref 0.50–1.35)
GFR calc Af Amer: 76 mL/min — ABNORMAL LOW (ref >90)
GFR calc non Af Amer: 66 mL/min — ABNORMAL LOW (ref >90)
Glucose, Bld: 105 mg/dL — ABNORMAL HIGH (ref 70–99)
Potassium: 3.8 mmol/L (ref 3.5–5.1)
SODIUM: 140 mmol/L (ref 135–145)

## 2014-07-23 LAB — HEPARIN LEVEL (UNFRACTIONATED)
HEPARIN UNFRACTIONATED: 0.35 [IU]/mL (ref 0.30–0.70)
Heparin Unfractionated: 0.31 IU/mL (ref 0.30–0.70)
Heparin Unfractionated: 0.23 IU/mL — ABNORMAL LOW (ref 0.30–0.70)

## 2014-07-23 LAB — VANCOMYCIN, TROUGH: Vancomycin Tr: 14.7 ug/mL (ref 10.0–20.0)

## 2014-07-23 MED ORDER — HEPARIN (PORCINE) IN NACL 100-0.45 UNIT/ML-% IJ SOLN
1400.0000 [IU]/h | INTRAMUSCULAR | Status: AC
  Administered 2014-07-23 (×2): 1300 [IU]/h via INTRAVENOUS
  Filled 2014-07-23 (×3): qty 250

## 2014-07-23 NOTE — Progress Notes (Signed)
    Subjective  -   Feels his fever is breaking today as he has some sweats.  Arm feels about teh same.  Swelling is better breathing feels shallow Febrile overnight  Physical Exam:  Slightly decreased erythema in shoulder and upper arm Swelling improved Remains tender   BCx:  NTD    Assessment/Plan:    Discussed waiting one more day to see if he gets better.  If not, I would plan excision of the remaining fistula tomorrow.  Continue IV heparin Cont IV AbX  Tynesha Free IV, V. WELLS 07/23/2014 7:44 AM --  Filed Vitals:   07/23/14 0511  BP: 137/74  Pulse: 80  Temp: 99.2 F (37.3 C)  Resp: 20    Intake/Output Summary (Last 24 hours) at 07/23/14 0744 Last data filed at 07/23/14 0552  Gross per 24 hour  Intake 1766.88 ml  Output   3400 ml  Net -1633.12 ml     Laboratory CBC    Component Value Date/Time   WBC 9.9 07/22/2014 0615   HGB 10.0* 07/22/2014 0615   HCT 28.6* 07/22/2014 0615   PLT 228 07/22/2014 0615    BMET    Component Value Date/Time   NA 137 07/22/2014 1029   K 3.6 07/22/2014 1029   CL 107 07/22/2014 1029   CO2 24 07/22/2014 1029   GLUCOSE 125* 07/22/2014 1029   BUN 12 07/22/2014 1029   CREATININE 1.44* 07/22/2014 1029   CALCIUM 8.7 07/22/2014 1029   GFRNONAA 60* 07/22/2014 1029   GFRAA 69* 07/22/2014 1029    COAG Lab Results  Component Value Date   INR 1.09 07/16/2014   No results found for: PTT  Antibiotics Anti-infectives    Start     Dose/Rate Route Frequency Ordered Stop   07/21/14 1400  vancomycin (VANCOCIN) 1,250 mg in sodium chloride 0.9 % 250 mL IVPB     1,250 mg 166.7 mL/hr over 90 Minutes Intravenous Every 24 hours 07/20/14 2056     07/20/14 1945  cefTRIAXone (ROCEPHIN) 1 g in dextrose 5 % 50 mL IVPB - Premix  Status:  Discontinued     1 g 100 mL/hr over 30 Minutes Intravenous Every 24 hours 07/20/14 1930 07/20/14 2029   07/20/14 1545  vancomycin (VANCOCIN) IVPB 1000 mg/200 mL premix     1,000 mg 200 mL/hr over 60  Minutes Intravenous  Once 07/20/14 1542 07/20/14 1739       V. Leia Alf, M.D. [facility] Office: [phone] Pager:  [phone]

## 2014-07-23 NOTE — Progress Notes (Signed)
ANTICOAGULATION & ANTIBIOTIC CONSULT NOTE - Follow Up Consult  Pharmacy Consult for Heparin + Vancomycin Indication: New acute LUE DVT & LUE cellulitis/AVF infection  Allergies  Allergen Reactions  . Erythromycin Nausea And Vomiting  . Sulfa Antibiotics Nausea And Vomiting    Other reaction(s): ITCHING  . Sulfacetamide Sodium     Patient Measurements: Height: 6' (182.9 cm) Weight: 140 lb 8.3 oz (63.74 kg) IBW/kg (Calculated) : 77.6 Heparin Dosing Weight: 62.6 kg  Vital Signs: Temp: 99.9 F (37.7 C) (03/10 0940) Temp Source: Oral (03/10 0940) BP: 138/76 mmHg (03/10 0940) Pulse Rate: 89 (03/10 0940)  Labs:  Recent Labs  07/20/14 2000 07/21/14 0137  07/22/14 0615  07/22/14 1029 07/22/14 1555 07/23/14 0053 07/23/14 0715  HGB  --  10.3*  --  10.0*  --   --   --   --  9.1*  HCT  --  29.5*  --  28.6*  --   --   --   --  27.0*  PLT  --  226  --  228  --   --   --   --  239  HEPARINUNFRC  --   --   < >  --   < >  --  0.16* 0.31 0.35  CREATININE  --  1.53*  --   --   --  1.44*  --   --  1.33  TROPONINI <0.03 <0.03  --   --   --   --   --   --   --   < > = values in this interval not displayed.  Estimated Creatinine Clearance: 66.5 mL/min (by C-G formula based on Cr of 1.33).   Medications:  Heparin @ 1300 units/hr  Assessment: [name] who presented on 07/20/14 with LUE swelling/pain and CP after a recent AVF revision on 3/3. The patient was started on Vancomycin for empiric cellulitis coverage. On 3/8, dopplers revealed a new acute LUE DVT and pharmacy was consulted to start heparin with a bolus.  A heparin level this morning reamains therapeutic  (HL 0.35 << 0.31, goal of 0.3-0.7). Hgb/Hct slight drop, plts wnl - no overt s/sx of bleeding noted per RN report.  The patient also continues on Vancomycin for cellulitis and infected AVF coverage. Renal function has improved since initiation. A Vancomycin trough this afternoon resulted as therapeutic (VT 14.7 mcg/ml, goal of  10-15 mcg/ml) - will continue with the current dose.  Goal of Therapy:  Heparin level 0.3-0.7 units/ml Monitor platelets by anticoagulation protocol: Yes   Plan:  1. Continue Heparin at the current rate of 1300 units/hr (13 ml/hr) 2. Continue Vancomycin 1250 mg IV every 24 hours 3. Will continue to monitor for any signs/symptoms of bleeding and will follow up with heparin level in the a.m.  4. Will continue to follow renal function, culture results, LOT, and antibiotic de-escalation plans   Alycia Rossetti, PharmD, BCPS Clinical Pharmacist Pager: [phone] 07/23/2014 11:05 AM

## 2014-07-23 NOTE — Progress Notes (Signed)
Family Medicine Teaching Service Daily Progress Note Intern Pager: [phone]  Patient name: Tyler Alvarez Medical record number: [id number] Date of birth: 1976/02/22 Age: 39 y.o. Gender: male  Primary Care Provider: Conni Slipper, MD Consultants: Vascular Surgery Code Status: Full  Pt Overview and Major Events to Date:  3/7-3/8: Admitted with concern for thrombophlebitis vs. Cellulitis of left arm incision site.  3/9 - 3/10 Continues to be intermittently febrile, but slowly improving. DVT of left subclavian. Watchful waiting surgery vs. Conservative management.   Assessment and Plan: Tyler Alvarez is a 39 y.o. male who presented with left upper extremity cellulitis vs. thrombophlebitis with recent excision of large aneurysmal upper arm AV fistula. 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   #Cellulitis/Superficial thrombophlebitis: Recent excision of large aneurysmal upper AV fistula on 07/16/14. Presents with erythema, pain and fever 102 and WBC 14. He is on chronic immunosuppression for kidney transplant. Pain and swelling improving. Erythema markedly improved. WBC 14 > 8.  - Vascular surgery consulting > Will decide about surgery tomorrow. Continue current management for now.  - NPO after MN  - DVT of L subclavian and superficial thrombophlebitis. Heparin Gtt as below.  - Treating with Heparin Gtt for now.  - Blood cultures NGTD x 3 days, urine Cx Negative.   - tylenol for fever PRN. Febrile x 1 overnight.  - Watching vitals closely given known DVT with potential for PE. On Heparin GTT as above.  - Vancomycin per pharmacy given evidence of cellulitis in immunosuppression. Continue for now.   #ESRD s/p transplant: making urine and no suggestion of rejection. Spoke with renal (Dr. Moshe Cipro) about continue medication in light of  infection. Will continue meds for now.  - continue prednisone and prograf.  - Creatinine 1.61 > 1.44 - GFR stage IIIA - Protein on U/A. Will likely need repeat as an outpatient.   #Chest pain: reporting some chest pain but patient is not a good historian. EKG showing sinus tachycardia. ACS ruled out. HEART 2 at most.  - troponin negative.  - EKG with sinus tachycardia.  - telemetry without acute changes at this time.   #Tuberous Sclerosis: led to his kidney failure and likely seizure d/o.   #Left neck mass: reports that he has been followed by ENT as an outpatient. States that it is a result of his Tuberous sclerosis. No problems breathing or swallowing. Previously with an abscess in the area that was I&D.  - Previously seen by ENT, but elected not to have removed.   #HTN: controlled  - continue medications   #Seizure d/o: unsure of last seizure  - will continue depakote 500qhs.   FEN/GI: regular diet  Prophylaxis: subQ hep   Disposition: Home once improving.   Subjective:  Febrile once again overnight. Overall, symptoms improving. Physical exam with much improvement. Still with some tenderness to palpation. Pt. Without symptoms of SOB, CP, N/V, sweats. He has not had a BM in around 1 week, but says he hasn't been eating much at all. He denies abdominal pain or feeling like he needs to have BM. He says he has tried to stay in bed due to his arm.  Objective: Temp:  [99.2 F (37.3 C)-101 F (38.3 C)] 99.2 F (37.3 C) (03/10 0511) Pulse Rate:  [80-93] 80 (03/10 0511) Resp:  [17-20] 20 (03/10 0511) BP: (137)/(74) 137/74 mmHg (03/10 0511) SpO2:  [96 %-  99 %] 99 % (03/10 0511) Weight:  [140 lb 8.3 oz (63.74 kg)] 140 lb 8.3 oz (63.74 kg) (03/09 2209) Physical Exam: General: NAD, Resting comfortably  Cardiovascular: RRR, 2/6 Systolic ejection murmur, No gallops, no rubs, Normal S1/S2, 2+ distal pulses  Respiratory: CTA Bilaterally, Unlabored, Appropriate rate  Abdomen: S,  Nontender,  Good bowel sounds, No organomegaly  Extremities: Erythema markedly improved. Small amount left mostly around palpable thrombosis, TTP of left shoulder is improving and left forearm is improving as well. Swelling also markedly improved. No drainage from incision site, no evidence of cellulitis directly around incision site. Ecchymosis resolving. Little pain with full strength grip.   Skin: left neck known tuber / growth, nontender to palpation. Otherwise no rashes / lesions Neuro: AAOx3, no focal deficits at this time.   Laboratory:  Recent Labs Lab 07/21/14 0137 07/22/14 0615 07/23/14 0715  WBC 12.2* 9.9 8.1  HGB 10.3* 10.0* 9.1*  HCT 29.5* 28.6* 27.0*  PLT 226 228 239    Recent Labs Lab 07/16/14 1710  07/20/14 1611 07/21/14 0137 07/22/14 1029  NA 137  < > 133* 133* 137  K 3.9  < > 4.6 4.5 3.6  CL 106  < > 96 102 107  CO2 26  < > 24 22 24   BUN 21  < > 15 17 12   CREATININE 1.61*  < > 1.52* 1.53* 1.44*  CALCIUM 8.8  < > 9.6 8.7 8.7  PROT 6.0  --   --   --   --   BILITOT 0.7  --   --   --   --   ALKPHOS 45  --   --   --   --   ALT 15  --   --   --   --   AST 16  --   --   --   --   GLUCOSE 102*  < > 104* 131* 125*  < > = values in this interval not displayed. EKG - NSR  Troponin - Neg. x3  Imaging/Diagnostic Tests: LUE Venous Duplex 3/8 : DVT of left subclavian and left superficial vein thrombosis.   Tyler Hacker, MD 07/23/2014, 8:55 AM PGY-1, [place] Intern pager: [phone], text pages welcome

## 2014-07-23 NOTE — Progress Notes (Addendum)
ANTICOAGULATION CONSULT NOTE - Follow Up Consult  Pharmacy Consult for Heparin Indication: New acute LUE DVT  Allergies  Allergen Reactions  . Erythromycin Nausea And Vomiting  . Sulfa Antibiotics Nausea And Vomiting    Other reaction(s): ITCHING  . Sulfacetamide Sodium     Patient Measurements: Height: 6' (182.9 cm) Weight: 140 lb 8.3 oz (63.74 kg) IBW/kg (Calculated) : 77.6 Heparin Dosing Weight: 62.6 kg  Vital Signs: Temp: 101 F (38.3 C) (03/09 2209) Temp Source: Oral (03/09 2209) BP: 137/74 mmHg (03/09 2209) Pulse Rate: 93 (03/09 2209)  Labs:  Recent Labs  07/20/14 1611 07/20/14 2000 07/21/14 0137  07/22/14 0615 07/22/14 0654 07/22/14 1029 07/22/14 1555 07/23/14 0053  HGB 12.0*  --  10.3*  --  10.0*  --   --   --   --   HCT 34.3*  --  29.5*  --  28.6*  --   --   --   --   PLT 245  --  226  --  228  --   --   --   --   HEPARINUNFRC  --   --   --   < >  --  0.32  --  0.16* 0.31  CREATININE 1.52*  --  1.53*  --   --   --  1.44*  --   --   TROPONINI  --  <0.03 <0.03  --   --   --   --   --   --   < > = values in this interval not displayed.  Estimated Creatinine Clearance: 61.4 mL/min (by C-G formula based on Cr of 1.44).   Medications:  Heparin @ 1250 units/hr  Assessment: 6 hour heparin level = 0.31 on 1250 units/hr IV heparin in this 39 y.o male on anticoagulation for new acute LUE DVT.    No bleeding noted. Current level is therapeutic, at low end of goal. Will increase rate to keep within goal.  Goal of Therapy:  Heparin level 0.3-0.7 units/ml Monitor platelets by anticoagulation protocol: Yes   Plan:  Increase heparin drip rate to 1300  units/hr  Will continue to monitor for any signs/symptoms of bleeding Daily HL and CBC  Nicole Cella, RPh Clinical Pharmacist Pager: [phone] 07/23/2014 3:04 AM

## 2014-07-24 LAB — CBC
HEMATOCRIT: 23.6 % — AB (ref 39.0–52.0)
HEMATOCRIT: 25.5 % — AB (ref 39.0–52.0)
Hemoglobin: 8.1 g/dL — ABNORMAL LOW (ref 13.0–17.0)
Hemoglobin: 8.6 g/dL — ABNORMAL LOW (ref 13.0–17.0)
MCH: 29.8 pg (ref 26.0–34.0)
MCH: 29.4 pg (ref 26.0–34.0)
MCHC: 34.3 g/dL (ref 30.0–36.0)
MCHC: 33.7 g/dL (ref 30.0–36.0)
MCV: 86.8 fL (ref 78.0–100.0)
MCV: 87 fL (ref 78.0–100.0)
Platelets: 282 10*3/uL (ref 150–400)
Platelets: 308 10*3/uL (ref 150–400)
RBC: 2.72 MIL/uL — AB (ref 4.22–5.81)
RBC: 2.93 MIL/uL — ABNORMAL LOW (ref 4.22–5.81)
RDW: 12.8 % (ref 11.5–15.5)
RDW: 12.6 % (ref 11.5–15.5)
WBC: 7.9 10*3/uL (ref 4.0–10.5)
WBC: 7.8 10*3/uL (ref 4.0–10.5)

## 2014-07-24 LAB — FERRITIN: FERRITIN: 720 ng/mL — AB (ref 22–322)

## 2014-07-24 LAB — RETICULOCYTES
RBC.: 2.75 MIL/uL — AB (ref 4.22–5.81)
Retic Count, Absolute: 49.5 10*3/uL (ref 19.0–186.0)
Retic Ct Pct: 1.8 % (ref 0.4–3.1)

## 2014-07-24 LAB — HEPARIN LEVEL (UNFRACTIONATED): Heparin Unfractionated: 0.27 IU/mL — ABNORMAL LOW (ref 0.30–0.70)

## 2014-07-24 LAB — BASIC METABOLIC PANEL
Anion gap: 8 (ref 5–15)
CO2: 25 mmol/L (ref 19–32)
Calcium: 8.4 mg/dL (ref 8.4–10.5)
Chloride: 106 mmol/L (ref 96–112)
Creatinine, Ser: 1.37 mg/dL — ABNORMAL HIGH (ref 0.50–1.35)
GFR calc Af Amer: 73 mL/min — ABNORMAL LOW (ref >90)
GFR calc non Af Amer: 63 mL/min — ABNORMAL LOW (ref >90)
GLUCOSE: 109 mg/dL — AB (ref 70–99)
POTASSIUM: 3.7 mmol/L (ref 3.5–5.1)
Sodium: 139 mmol/L (ref 135–145)

## 2014-07-24 LAB — IRON AND TIBC
IRON: 34 ug/dL — AB (ref 42–165)
Saturation Ratios: 24 % (ref 20–55)
TIBC: 142 ug/dL — AB (ref 215–435)
UIBC: 108 ug/dL — ABNORMAL LOW (ref 125–400)

## 2014-07-24 LAB — VITAMIN B12: Vitamin B-12: 528 pg/mL (ref 211–911)

## 2014-07-24 LAB — FOLATE: FOLATE: 5.5 ng/mL

## 2014-07-24 MED ORDER — RIVAROXABAN (XARELTO) EDUCATION KIT FOR DVT/PE PATIENTS
PACK | Freq: Once | Status: AC
  Administered 2014-07-24: 17:00:00
  Filled 2014-07-24: qty 1

## 2014-07-24 MED ORDER — RIVAROXABAN 15 MG PO TABS
15.0000 mg | ORAL_TABLET | Freq: Two times a day (BID) | ORAL | Status: DC
  Administered 2014-07-24 – 2014-07-27 (×7): 15 mg via ORAL
  Filled 2014-07-24 (×9): qty 1

## 2014-07-24 MED ORDER — POLYETHYLENE GLYCOL 3350 17 G PO PACK
17.0000 g | PACK | Freq: Every day | ORAL | Status: DC
  Administered 2014-07-25 – 2014-07-26 (×2): 17 g via ORAL
  Filled 2014-07-24 (×3): qty 1

## 2014-07-24 MED ORDER — DOXYCYCLINE HYCLATE 100 MG PO TABS
100.0000 mg | ORAL_TABLET | Freq: Two times a day (BID) | ORAL | Status: DC
  Administered 2014-07-24 – 2014-07-27 (×6): 100 mg via ORAL
  Filled 2014-07-24 (×8): qty 1

## 2014-07-24 MED ORDER — RIVAROXABAN 20 MG PO TABS: 20.0000 mg | ORAL_TABLET | Freq: Every day | ORAL | Status: DC

## 2014-07-24 NOTE — Progress Notes (Addendum)
ADDENDUM  Patient to transition to PO anticoagulant. He is to start Xarelto. SCr 1.3 with CrCl of 64mL/min. No bleeding. hgb dropped a little more this morning as noted in heparin note below.  Plan: -continue heparin at 1400 units/hr until first dose of Xarelto given tonight  -Xarelto 15mg  PO BIDwc starting tonight x21 days, then on 08/15/2014, transition to 20mg  qsupper -patient will be given education prior to discharge -consult entered for case manager to evaluate cost for patient  Brooklee Michelin D. Mariaguadalupe Fialkowski, PharmD, BCPS Clinical Pharmacist Pager: [phone] 07/24/2014 12:54 PM   ANTICOAGULATION CONSULT NOTE - Follow Up Consult  Pharmacy Consult for Heparin Indication: New acute LUE DVT  Allergies  Allergen Reactions  . Erythromycin Nausea And Vomiting  . Sulfa Antibiotics Nausea And Vomiting    Other reaction(s): ITCHING  . Sulfacetamide Sodium     Patient Measurements: Height: 6' (182.9 cm) Weight: 140 lb 9.8 oz (63.78 kg) IBW/kg (Calculated) : 77.6 Heparin Dosing Weight: 62.6 kg  Vital Signs: Temp: 99.5 F (37.5 C) (03/11 0604) Temp Source: Oral (03/11 0604) BP: 122/70 mmHg (03/11 0604) Pulse Rate: 86 (03/11 0604)  Labs:  Recent Labs  07/22/14 0615  07/22/14 1029  07/23/14 0715 07/23/14 1015 07/24/14 0551  HGB 10.0*  --   --   --  9.1*  --  8.1*  HCT 28.6*  --   --   --  27.0*  --  23.6*  PLT 228  --   --   --  239  --  282  HEPARINUNFRC  --   < >  --   < > 0.35 0.23* 0.27*  CREATININE  --   --  1.44*  --  1.33  --  1.37*  < > = values in this interval not displayed.  Estimated Creatinine Clearance: 64.7 mL/min (by C-G formula based on Cr of 1.37).   Medications:  Heparin @ 1300units/hr  Assessment: 39 y.o male on anticoagulation for new acute LUE DVT. Was on lower end of goal, but therapetuic x2, heparin level dropped this morning to just below goal at 0.27. Hgb dropped again to 8.1, plts WNL- no bleeding noted.   Goal of Therapy:  Heparin level 0.3-0.7  units/ml Monitor platelets by anticoagulation protocol: Yes   Plan:  -Increase heparin drip rate to 1400  Units/hr -heparin level in 6 hours -Will continue to monitor for any signs/symptoms of bleeding -Daily HL and CBC -follow for transition to PO anticoagulation  Azaleah Usman D. Baylei Siebels, PharmD, BCPS Clinical Pharmacist Pager: [phone] 07/24/2014 8:28 AM

## 2014-07-24 NOTE — Progress Notes (Signed)
    Subjective  -   States he feels a little better today. Fever curve improved the last 24 hours Swelling decreased   Physical Exam:  The erythema along the left upper arm has improved overnight. The area still remains tender. Edema has decreased with arm elevation    Assessment/Plan:    Overall, I feel that he has improved over the last 48 hours.  Although he still has low-grade fevers, his MAXIMUM TEMPERATURE has decreased.  His white count remained within normal limits.  At this point I would favor nonoperative treatment which would consist of antibiotics and anticoagulation.  I feel it is reasonable to stop his heparin and start him on an oral anticoagulant so that he can be discharged to home.  I would also continue antibiotics.  He could potentially be discharged today with follow-up to see me in the office on Monday  Telena Peyser IV, V. WELLS 07/24/2014 7:50 AM --  Filed Vitals:   07/24/14 0604  BP: 122/70  Pulse: 86  Temp: 99.5 F (37.5 C)  Resp: 17    Intake/Output Summary (Last 24 hours) at 07/24/14 0750 Last data filed at 07/24/14 0604  Gross per 24 hour  Intake   1300 ml  Output   2275 ml  Net   -975 ml     Laboratory CBC    Component Value Date/Time   WBC 7.9 07/24/2014 0551   HGB 8.1* 07/24/2014 0551   HCT 23.6* 07/24/2014 0551   PLT 282 07/24/2014 0551    BMET    Component Value Date/Time   NA 140 07/23/2014 0715   K 3.8 07/23/2014 0715   CL 109 07/23/2014 0715   CO2 24 07/23/2014 0715   GLUCOSE 105* 07/23/2014 0715   BUN 10 07/23/2014 0715   CREATININE 1.33 07/23/2014 0715   CALCIUM 8.6 07/23/2014 0715   GFRNONAA 66* 07/23/2014 0715   GFRAA 76* 07/23/2014 0715    COAG Lab Results  Component Value Date   INR 1.09 07/16/2014   No results found for: PTT  Antibiotics Anti-infectives    Start     Dose/Rate Route Frequency Ordered Stop   07/21/14 1400  vancomycin (VANCOCIN) 1,250 mg in sodium chloride 0.9 % 250 mL IVPB     1,250  mg 166.7 mL/hr over 90 Minutes Intravenous Every 24 hours 07/20/14 2056     07/20/14 1945  cefTRIAXone (ROCEPHIN) 1 g in dextrose 5 % 50 mL IVPB - Premix  Status:  Discontinued     1 g 100 mL/hr over 30 Minutes Intravenous Every 24 hours 07/20/14 1930 07/20/14 2029   07/20/14 1545  vancomycin (VANCOCIN) IVPB 1000 mg/200 mL premix     1,000 mg 200 mL/hr over 60 Minutes Intravenous  Once 07/20/14 1542 07/20/14 1739       V. Leia Alf, M.D. [facility] Office: [phone] Pager:  [phone]

## 2014-07-24 NOTE — Progress Notes (Signed)
Family Medicine Teaching Service Daily Progress Note Intern Pager: [phone]  Patient name: ALEXANDERJAMES BERG Medical record number: [id number] Date of birth: April 11, 1976 Age: 39 y.o. Gender: male  Primary Care Provider: Conni Slipper, MD Consultants: Vascular Surgery Code Status: Full  Pt Overview and Major Events to Date:  3/7-3/8: Admitted with concern for thrombophlebitis vs. Cellulitis of left arm incision site.  3/9 - 3/10 Continues to be intermittently febrile, but slowly improving. DVT of left subclavian. Watchful waiting surgery vs. Conservative management.  3/11 - Pt. Continues to improve. Home soon pending improvement in anemia.   Assessment and Plan: SEBASTIAN LURZ is a 39 y.o. male who presented with left upper extremity cellulitis vs. thrombophlebitis with recent excision of large aneurysmal upper arm AV fistula. 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   #Cellulitis/Superficial thrombophlebitis: Recent excision of large aneurysmal upper AV fistula on 07/16/14. Presents with erythema, pain and fever 102 and WBC 14. He is on chronic immunosuppression for kidney transplant. Pain and swelling improving. Erythema markedly improved. WBC 14 > 8.  - Vascular surgery consulting > No surgery. Stable to go home on anticoagulation and abx once ready.  - DVT of L subclavian and superficial thrombophlebitis. Will start Xarelto for treatment of DVT at discharge. 15mg  BID for 21 days > 20mg  daily to complete 3 months.  - Heparin Gtt to Xarelto.  - Afebrile overnight.  - Blood cultures NGTD x 3 days, urine Cx Negative.   - Watching vitals closely given known DVT with potential for PE. - Vancomycin per pharmacy transition to. Doxycycline po for a total of 14 days given immunosuppresion.   #Anemia - Pt with Hgb trending down from 12 on admission to  8.1. He has been getting fluids throughout this time. Platelets are normal. His baseline is near 12. He has a normal MCV. History of ESRD, but with normal transplanted kidney at this time. No symptoms currently. No dizziness or SOB. Surgical site with improved swelling, tenderness, and ecchymosis, no evidence of hematoma.  - Reticulocytes - Anemia panel pending - Will continue to trend CBC. Repeat this afternoon.  - Peripheral smear - Haptoglobin - Low threshold to transfuse if Hgb continues to drop < 7.  - Consider FOBT if able to have BM.   #ESRD s/p transplant: making urine and no suggestion of rejection. Spoke with renal (Dr. Moshe Cipro) about continue medication in light of infection. Will continue meds for now.  - continue prednisone and prograf.  - Creatinine 1.61 > 1.37 - GFR stage IIIA - Protein on U/A. Will likely need repeat as an outpatient.   #Chest pain: reporting some chest pain but patient is not a good historian. EKG showing sinus tachycardia. ACS ruled out. HEART 2 at most.  - troponin negative.  - EKG with sinus tachycardia.  - telemetry without acute changes at this time.   #Tuberous Sclerosis: led to his kidney failure and likely seizure d/o.   #Left neck mass: reports that he has been followed by ENT as an outpatient. States that it is a result of his Tuberous sclerosis. No problems breathing or swallowing. Previously with an abscess in the area that was I&D.  - Previously seen by ENT, but elected not to have removed.   #HTN: controlled  - continue medications   #Seizure d/o: unsure of last seizure  - will continue depakote 500qhs.   FEN/GI:  regular diet  Prophylaxis: subQ hep   Disposition: Home once improving.   Subjective:  Afebrile overnight. Symptomatically continues to improve. He says that he feels that his swelling, tenderness, bruising have improved. Erythema nearly gone. He is tolerating his diet well, but has not had a bowel movement in  some time. Has abdominal pain, or feeling he needs to go. Concerned about ambulation with DVT in his left arm. He has no other complaints this a.m. No dizziness, shortness of breath, chest pain, nausea, vomiting.  Objective: Temp:  [98.5 F (36.9 C)-99.9 F (37.7 C)] 99.5 F (37.5 C) (03/11 0604) Pulse Rate:  [84-89] 86 (03/11 0604) Resp:  [17-20] 17 (03/11 0604) BP: (122-145)/(70-80) 122/70 mmHg (03/11 0604) SpO2:  [96 %-99 %] 96 % (03/11 0604) Weight:  [140 lb 9.8 oz (63.78 kg)] 140 lb 9.8 oz (63.78 kg) (03/10 2118) Physical Exam: General: NAD, AAO 3 Cardiovascular: RRR, 2/6 Systolic ejection murmur, No gallops, no rubs, Normal S1/S2, 2+ distal pulses  Respiratory: CTA Bilaterally, Unlabored, Appropriate rate  Abdomen: S, NT, Nd, +Bs, No organomegaly.  Extremities: Erythema nearly gone.  TTP of left shoulder is mild. Swelling also markedly improved. No drainage from incision site, no evidence of cellulitis directly around incision site. Ecchymosis resolving. Skin: left neck known tuber / growth, nontender to palpation. Otherwise no rashes / lesions Neuro: AAOx3, no focal deficits at this time.   Laboratory:  Recent Labs Lab 07/22/14 0615 07/23/14 0715 07/24/14 0551  WBC 9.9 8.1 7.9  HGB 10.0* 9.1* 8.1*  HCT 28.6* 27.0* 23.6*  PLT 228 239 282    Recent Labs Lab 07/22/14 1029 07/23/14 0715 07/24/14 0551  NA 137 140 139  K 3.6 3.8 3.7  CL 107 109 106  CO2 24 24 25   BUN 12 10 <5*  CREATININE 1.44* 1.33 1.37*  CALCIUM 8.7 8.6 8.4  GLUCOSE 125* 105* 109*   EKG - NSR  Troponin - Neg. x3  Imaging/Diagnostic Tests: LUE Venous Duplex 3/8 : DVT of left subclavian and left superficial vein thrombosis.   Aquilla Hacker, MD 07/24/2014, 9:17 AM PGY-1, [place] Intern pager: [phone], text pages welcome

## 2014-07-24 NOTE — Care Management Note (Signed)
CARE MANAGEMENT NOTE 07/24/2014  Patient:  Tyler Alvarez, Tyler Alvarez   Account Number:  [id number]  Date Initiated:  07/24/2014  Documentation initiated by:  Stalin Gruenberg  Subjective/Objective Assessment:   CM following for progression and d/c planning.     Action/Plan:   07/24/14 Met with pt and mother no d/c needs at this time, IM given.   Anticipated DC Date:  07/26/2014   Anticipated DC Plan:  HOME/SELF CARE         Choice offered to / List presented to:             Status of service:  Completed, signed off Medicare Important Message given?  YES (If response is "NO", the following Medicare IM given date fields will be blank) Date Medicare IM given:  07/24/2014 Medicare IM given by:  Alfrieda Tarry Date Additional Medicare IM given:   Additional Medicare IM given by:    Discharge Disposition:    Per UR Regulation:    If discussed at Long Length of Stay Meetings, dates discussed:    Comments:

## 2014-07-25 DIAGNOSIS — D649 Anemia, unspecified: Secondary | ICD-10-CM | POA: Insufficient documentation

## 2014-07-25 LAB — CBC WITH DIFFERENTIAL/PLATELET
BASOS PCT: 1 % (ref 0–1)
Basophils Absolute: 0 10*3/uL (ref 0.0–0.1)
Eosinophils Absolute: 0.2 10*3/uL (ref 0.0–0.7)
Eosinophils Relative: 3 % (ref 0–5)
HCT: 25.2 % — ABNORMAL LOW (ref 39.0–52.0)
Hemoglobin: 8.6 g/dL — ABNORMAL LOW (ref 13.0–17.0)
LYMPHS ABS: 1.9 10*3/uL (ref 0.7–4.0)
Lymphocytes Relative: 26 % (ref 12–46)
MCH: 29.8 pg (ref 26.0–34.0)
MCHC: 34.1 g/dL (ref 30.0–36.0)
MCV: 87.2 fL (ref 78.0–100.0)
MONO ABS: 0.7 10*3/uL (ref 0.1–1.0)
Monocytes Relative: 10 % (ref 3–12)
Neutro Abs: 4.3 10*3/uL (ref 1.7–7.7)
Neutrophils Relative %: 60 % (ref 43–77)
Platelets: 311 10*3/uL (ref 150–400)
RBC: 2.89 MIL/uL — AB (ref 4.22–5.81)
RDW: 12.6 % (ref 11.5–15.5)
WBC: 7.2 10*3/uL (ref 4.0–10.5)

## 2014-07-25 LAB — BASIC METABOLIC PANEL
Anion gap: 9 (ref 5–15)
BUN: 9 mg/dL (ref 6–23)
CALCIUM: 9.4 mg/dL (ref 8.4–10.5)
CO2: 26 mmol/L (ref 19–32)
Chloride: 103 mmol/L (ref 96–112)
Creatinine, Ser: 1.45 mg/dL — ABNORMAL HIGH (ref 0.50–1.35)
GFR calc Af Amer: 68 mL/min — ABNORMAL LOW (ref >90)
GFR, EST NON AFRICAN AMERICAN: 59 mL/min — AB (ref >90)
GLUCOSE: 121 mg/dL — AB (ref 70–99)
Potassium: 3.4 mmol/L — ABNORMAL LOW (ref 3.5–5.1)
SODIUM: 138 mmol/L (ref 135–145)

## 2014-07-25 LAB — HAPTOGLOBIN: Haptoglobin: 204 mg/dL — ABNORMAL HIGH (ref 34–200)

## 2014-07-25 MED ORDER — SENNA 8.6 MG PO TABS
1.0000 | ORAL_TABLET | Freq: Every day | ORAL | Status: DC
  Administered 2014-07-27: 8.6 mg via ORAL
  Filled 2014-07-25 (×3): qty 1

## 2014-07-25 MED ORDER — BISACODYL 10 MG RE SUPP: 10.0000 mg | Freq: Every day | RECTAL | Status: DC | PRN

## 2014-07-25 MED ORDER — POTASSIUM CHLORIDE CRYS ER 20 MEQ PO TBCR
40.0000 meq | EXTENDED_RELEASE_TABLET | Freq: Once | ORAL | Status: AC
  Administered 2014-07-25: 40 meq via ORAL
  Filled 2014-07-25: qty 2

## 2014-07-25 NOTE — Progress Notes (Signed)
Family Medicine Teaching Service Daily Progress Note Intern Pager: [phone]  Patient name: Tyler Alvarez Medical record number: [id number] Date of birth: 1975-06-27 Age: 39 y.o. Gender: male  Primary Care Provider: Conni Slipper, MD Consultants: Vascular Surgery Code Status: Full  Pt Overview and Major Events to Date:  3/7-3/8: Admitted with concern for thrombophlebitis vs. Cellulitis of left arm incision site.  3/9 - 3/10 Continues to be intermittently febrile, but slowly improving. DVT of left subclavian. Watchful waiting surgery vs. Conservative management.  3/11 - Pt. Continues to improve. Home soon pending improvement in anemia.   Assessment and Plan: Tyler Alvarez is a 39 y.o. male who presented with left upper extremity cellulitis vs. thrombophlebitis with recent excision of large aneurysmal upper arm AV fistula. 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   #Cellulitis/Superficial thrombophlebitis: Recent excision of large aneurysmal upper AV fistula on 07/16/14. Presents with erythema, pain and fever 102 and WBC 14. He is on chronic immunosuppression for kidney transplant. Pain and swelling improving. Erythema markedly improved. WBC 14 > 8.  - Vascular surgery consulting > No surgery. Stable to go home on anticoagulation and abx once ready.  - DVT of L subclavian and superficial thrombophlebitis. Heparin Gtt to Xarelto completed.  Xarelto for treatment of DVT at discharge. 15mg  BID for 21 days > 20mg  daily to complete 3 months.  - tmax 100F overnight.  - Blood cultures NGTD x 3 days, urine Cx Negative.   - Watching vitals closely given known DVT with potential for PE. - Vancomycin per pharmacy transitioned to Doxycycline po for a total of 14 days given immunosuppresion.    #Anemia - Pt with Hgb trending down from 12 on admission to  8.1. He has been getting fluids throughout this time. Platelets are normal. His baseline is near 12. He has a normal MCV. History of ESRD, but with normal transplanted kidney at this time. No symptoms currently. No SOB. Surgical site with improved swelling, tenderness, and ecchymosis, no evidence of hematoma.  - Anemia panel: low iron (34), low TIBC (142), low UIBC (108), ferritin elevated (720);retic normal. Haptoglobin High  - CBC pending last hgb 8.6 yesterday; Low threshold to transfuse if Hgb continues to drop < 7.  - Consider FOBT if able to have BM.  - Ambulate with assistance and PT eval and treat for weakness.   Constipation: Patient has not had a bowel movement since his surgery last week. - miralax, sennakot - PRN Doculax supp - Ambulate with assistance.   #ESRD s/p transplant: making urine and no suggestion of rejection. Spoke with renal (Dr. Moshe Cipro) about continue medication in light of infection. Will continue meds for now.  - continue prednisone and prograf.  - Creatinine 1.61 > 1.37>1.45 - GFR stage IIIA - Protein on U/A. Will likely need repeat as an outpatient.   #Chest pain: reporting some chest pain but patient is not a good historian. EKG showing sinus tachycardia. ACS ruled out. HEART 2 at most.  - troponin negative.  - EKG with sinus tachycardia.  - telemetry without acute changes at this time.   #Tuberous Sclerosis: led to his kidney failure and likely seizure d/o.   #Left neck mass: reports that he has been followed by ENT as an outpatient. States that it is a result of his Tuberous sclerosis. No problems breathing or swallowing. Previously with an abscess in the area  that was I&D.  - Previously seen by ENT, but elected not to have removed.   #HTN: controlled  - continue medications   #Seizure d/o: unsure of last seizure  - will continue depakote 500qhs.   FEN/GI: regular diet  Prophylaxis: subQ hep   Disposition: Home once improving, needs to  get up and walk.  Subjective:  Tmax 100 F overnight.  Symptomatically continues to improve. He says that he feels that his swelling, tenderness, bruising have improved. Erythema nearly gone. He is tolerating his diet, but has not had a bowel movement in some time  No shortness of breath, chest pain, nausea, vomiting. Pt does complain of dizziness with sitting up to the side of the bed today. He has not ambulated at all since admission.   Objective: Temp:  [98 F (36.7 C)-100 F (37.8 C)] 98.5 F (36.9 C) (03/12 0533) Pulse Rate:  [85-94] 85 (03/12 0533) Resp:  [17-18] 18 (03/12 0533) BP: (120-150)/(65-83) 123/75 mmHg (03/12 0533) SpO2:  [95 %-98 %] 96 % (03/12 0533) Weight:  [141 lb 3.2 oz (64.048 kg)] 141 lb 3.2 oz (64.048 kg) (03/11 2221) Physical Exam: General: NAD, AAO 3 Cardiovascular: RRR, 2/6 Systolic ejection murmur, No gallops, no rubs, Normal S1/S2, 2+ distal pulses  Respiratory: CTA Bilaterally, Unlabored, Appropriate rate  Abdomen: S, NT, Nd, +Bs, No organomegaly.  Extremities: Erythema nearly gone.  TTP of left shoulder is mild. Swelling also markedly improved. No drainage from incision site, no evidence of cellulitis directly around incision site. Ecchymosis resolving. Skin: left neck known tuber / growth, nontender to palpation. Otherwise no rashes / lesions Neuro: AAOx3, no focal deficits at this time.   Laboratory:  Recent Labs Lab 07/23/14 0715 07/24/14 0551 07/24/14 1440  WBC 8.1 7.9 7.8  HGB 9.1* 8.1* 8.6*  HCT 27.0* 23.6* 25.5*  PLT 239 282 308    Recent Labs Lab 07/23/14 0715 07/24/14 0551 07/25/14 0623  NA 140 139 138  K 3.8 3.7 3.4*  CL 109 106 103  CO2 24 25 26   BUN 10 <5* 9  CREATININE 1.33 1.37* 1.45*  CALCIUM 8.6 8.4 9.4  GLUCOSE 105* 109* 121*   EKG - NSR  Troponin - Neg. x3  Imaging/Diagnostic Tests: LUE Venous Duplex 3/8 : DVT of left subclavian and left superficial vein thrombosis.   Ma Hillock, DO 07/25/2014, 9:26  AM PGY-3, [place] Intern pager: [phone], text pages welcome

## 2014-07-25 NOTE — Progress Notes (Addendum)
[facility]  Subjective  - He states his arm does feel better and he is being discharged home today.   Objective 123/75 85 98.5 F (36.9 C) (Oral) 18 96%  Intake/Output Summary (Last 24 hours) at 07/25/14 0915 Last data filed at 07/25/14 9449  Gross per 24 hour  Intake    480 ml  Output   2375 ml  Net  -1895 ml    Left upper arm mod edema, but improved. Erythema has dissipated Palpable radial pulse     Assessment/Planning: Resection of left BC AVF aneurysm 07/16/14 readmitted with superficial thrombophlebitis  Xarelto started yesterday Significant improvement over the past 72 hours. F/U with Dr. Trula Slade on Mon. In the office  Laurence Slate [facility] 07/25/2014 9:15 AM -- Agree with above.  Cellulitis seems to be improving. On Xarelto. Ready for d/c from our standpoint.  Ruta Hinds, MD [facility] Office: [phone] Pager: [phone]  Laboratory Lab Results:  Recent Labs  07/24/14 0551 07/24/14 1440  WBC 7.9 7.8  HGB 8.1* 8.6*  HCT 23.6* 25.5*  PLT 282 308   BMET  Recent Labs  07/24/14 0551 07/25/14 0623  NA 139 138  K 3.7 3.4*  CL 106 103  CO2 25 26  GLUCOSE 109* 121*  BUN <5* 9  CREATININE 1.37* 1.45*  CALCIUM 8.4 9.4    COAG Lab Results  Component Value Date   INR 1.09 07/16/2014   No results found for: PTT

## 2014-07-26 LAB — CBC WITH DIFFERENTIAL/PLATELET
Basophils Absolute: 0 10*3/uL (ref 0.0–0.1)
Basophils Relative: 0 % (ref 0–1)
EOS ABS: 0.2 10*3/uL (ref 0.0–0.7)
EOS PCT: 2 % (ref 0–5)
HCT: 26.2 % — ABNORMAL LOW (ref 39.0–52.0)
Hemoglobin: 8.8 g/dL — ABNORMAL LOW (ref 13.0–17.0)
LYMPHS PCT: 21 % (ref 12–46)
Lymphs Abs: 1.9 10*3/uL (ref 0.7–4.0)
MCH: 29.1 pg (ref 26.0–34.0)
MCHC: 33.6 g/dL (ref 30.0–36.0)
MCV: 86.8 fL (ref 78.0–100.0)
Monocytes Absolute: 0.8 10*3/uL (ref 0.1–1.0)
Monocytes Relative: 9 % (ref 3–12)
Neutro Abs: 5.9 10*3/uL (ref 1.7–7.7)
Neutrophils Relative %: 68 % (ref 43–77)
Platelets: 336 10*3/uL (ref 150–400)
RBC: 3.02 MIL/uL — ABNORMAL LOW (ref 4.22–5.81)
RDW: 12.7 % (ref 11.5–15.5)
WBC: 8.8 10*3/uL (ref 4.0–10.5)

## 2014-07-26 LAB — BASIC METABOLIC PANEL
Anion gap: 8 (ref 5–15)
BUN: 15 mg/dL (ref 6–23)
CHLORIDE: 103 mmol/L (ref 96–112)
CO2: 26 mmol/L (ref 19–32)
CREATININE: 1.62 mg/dL — AB (ref 0.50–1.35)
Calcium: 9.5 mg/dL (ref 8.4–10.5)
GFR, EST AFRICAN AMERICAN: 60 mL/min — AB (ref >90)
GFR, EST NON AFRICAN AMERICAN: 52 mL/min — AB (ref >90)
GLUCOSE: 109 mg/dL — AB (ref 70–99)
Potassium: 4.4 mmol/L (ref 3.5–5.1)
Sodium: 137 mmol/L (ref 135–145)

## 2014-07-26 MED ORDER — POLYETHYLENE GLYCOL 3350 17 G PO PACK
17.0000 g | PACK | Freq: Two times a day (BID) | ORAL | Status: DC
  Administered 2014-07-27: 17 g via ORAL
  Filled 2014-07-26 (×3): qty 1

## 2014-07-26 MED ORDER — LACTULOSE 10 GM/15ML PO SOLN
20.0000 g | Freq: Once | ORAL | Status: AC
  Administered 2014-07-26: 20 g via ORAL
  Filled 2014-07-26: qty 30

## 2014-07-26 NOTE — Evaluation (Signed)
Physical Therapy Evaluation Patient Details Name: Tyler Alvarez MRN: [id number] DOB: 1975/12/26 Today's Date: 07/26/2014   History of Present Illness  Pt is a 39 y.o. male presenting with left upper extremity cellulitis with recent excision of large aneurysmal upper arm AV fistula. 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   Clinical Impression  Pt admitted with above diagnosis. Pt currently with functional limitations due to the deficits listed below (see PT Problem List). At the time of PT eval pt was able to perform transfers and ambulation with close guard for safety. Pt very unsteady without an AD however does not require assistance from therapist to prevent a fall. Pt much more steady with the use of a RW however was vocal about not preferring to use the AD.   Pt will benefit from skilled PT to increase their independence and safety with mobility to allow discharge to the venue listed below.       Follow Up Recommendations Home health PT;Supervision/Assistance - 24 hour    Equipment Recommendations  Rolling walker with 5" wheels    Recommendations for Other Services       Precautions / Restrictions Precautions Precautions: Fall Restrictions Weight Bearing Restrictions: No      Mobility  Bed Mobility Overal bed mobility: Needs Assistance Bed Mobility: Supine to Sit     Supine to sit: Supervision     General bed mobility comments: Supervision for safety as pt dizzy with positional changes. Use of bed rail and increased time required.   Transfers Overall transfer level: Needs assistance Equipment used: Rolling walker (2 wheeled) Transfers: Sit to/from Stand Sit to Stand: Min guard         General transfer comment: Pt was able to power-up to full stand with and without RW and close guard for safety. Pt appears unsteady  however does not require assistance to recover.   Ambulation/Gait Ambulation/Gait assistance: Min guard Ambulation Distance (Feet): 30 Feet Assistive device: Rolling walker (2 wheeled);None Gait Pattern/deviations: Step-through pattern;Decreased stride length;Trunk flexed;Narrow base of support Gait velocity: Decreased Gait velocity interpretation: Below normal speed for age/gender General Gait Details: Pt was able to ambulate without AD, requiring close guard for safety as pt very unsteady. No physical assist necessary. With RW, pt reports increased pain in LUE, however balance appeared improved.   Stairs            Wheelchair Mobility    Modified Rankin (Stroke Patients Only)       Balance Overall balance assessment: Needs assistance Sitting-balance support: Feet supported;No upper extremity supported Sitting balance-Leahy Scale: Fair     Standing balance support: No upper extremity supported Standing balance-Leahy Scale: Fair Standing balance comment: Unsteady however no physical assist required to maintain standing balance.                              Pertinent Vitals/Pain Pain Assessment: 0-10 Pain Score: 8  Pain Location: Bilateral UE's - L from IV site, R from incision site Pain Descriptors / Indicators: Operative site guarding    Home Living Family/patient expects to be discharged to:: Private residence Living Arrangements: Parent Available Help at Discharge: Family;Available 24 hours/day Type of Home: Mobile home Home Access: Stairs to enter Entrance Stairs-Rails: Right;Left;Can reach both Entrance Stairs-Number of Steps: 4 Home Layout: One level Home Equipment: Cane - single point  Prior Function Level of Independence: Independent               Hand Dominance   Dominant Hand: Right    Extremity/Trunk Assessment   Upper Extremity Assessment: Defer to OT evaluation           Lower Extremity Assessment: Generalized  weakness      Cervical / Trunk Assessment: Normal  Communication   Communication: No difficulties  Cognition Arousal/Alertness: Awake/alert Behavior During Therapy: WFL for tasks assessed/performed Overall Cognitive Status: Impaired/Different from baseline Area of Impairment: Orientation;Attention;Safety/judgement Orientation Level: Disoriented to;Time Current Attention Level: Alternating     Safety/Judgement: Decreased awareness of safety;Decreased awareness of deficits          General Comments      Exercises        Assessment/Plan    PT Assessment Patient needs continued PT services  PT Diagnosis Acute pain;Difficulty walking   PT Problem List Decreased strength;Decreased range of motion;Decreased activity tolerance;Decreased balance;Decreased mobility;Decreased knowledge of use of DME;Decreased safety awareness;Decreased knowledge of precautions;Pain  PT Treatment Interventions DME instruction;Gait training;Stair training;Functional mobility training;Therapeutic activities;Therapeutic exercise;Neuromuscular re-education;Patient/family education   PT Goals (Current goals can be found in the Care Plan section) Acute Rehab PT Goals Patient Stated Goal: Feel better PT Goal Formulation: With patient/family Time For Goal Achievement: 08/02/14 Potential to Achieve Goals: Good    Frequency Min 3X/week   Barriers to discharge   Pt's mother present during session and states it is difficult at times for her to care for pt as "he does not do what he is supposed to do".    Co-evaluation               End of Session Equipment Utilized During Treatment: Gait belt Activity Tolerance: Other (comment) (Session limited by pt's rehab effort) Patient left: in chair;with call bell/phone within reach;with family/visitor present Nurse Communication: Mobility status         Time: 0912-0935 PT Time Calculation (min) (ACUTE ONLY): 23 min   Charges:   PT  Evaluation $Initial PT Evaluation Tier I: 1 Procedure PT Treatments $Gait Training: 8-22 mins   PT G Codes:        Rolinda Roan 08/05/2014, 9:55 AM   Rolinda Roan, PT, DPT Acute Rehabilitation Services Pager: [phone]

## 2014-07-26 NOTE — Progress Notes (Signed)
Family Medicine Teaching Service Daily Progress Note Intern Pager: [phone]  Patient name: Tyler Alvarez Medical record number: [id number] Date of birth: 1975-11-02 Age: 39 y.o. Gender: male  Primary Care Provider: Conni Slipper, MD Consultants: Vascular Surgery Code Status: Full  Pt Overview and Major Events to Date:  3/7-3/8: Admitted with concern for thrombophlebitis vs. Cellulitis of left arm incision site.  3/9 - 3/10 Continues to be intermittently febrile, but slowly improving. DVT of left subclavian. Watchful waiting surgery vs. Conservative management.  3/11 - Pt. Continues to improve. Home soon pending improvement in anemia.   Assessment and Plan: Tyler Alvarez is a 39 y.o. male who presented with left upper extremity cellulitis vs. thrombophlebitis with recent excision of large aneurysmal upper arm AV fistula. 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   #Cellulitis/Superficial thrombophlebitis: Recent excision of large aneurysmal upper AV fistula on 07/16/14. Presented with erythema, pain and fever 102 and WBC 14. He is on chronic immunosuppression for kidney transplant. Pain and swelling improving. Erythema markedly improved. WBC 14 > 8.  - Vascular surgery consulted> No surgery. Stable to go home on anticoagulation and abx once ready.  - DVT of L subclavian and superficial thrombophlebitis. Heparin Gtt to Xarelto completed.  Xarelto for treatment of DVT at discharge. 15mg  BID for 21 days > 20mg  daily to complete 3 months.  - afebrile overnight.  - Blood cultures NGTD x 4 days, urine Cx Negative.   - Watching vitals closely given known DVT with potential for PE. - Vancomycin per pharmacy transitioned to Doxycycline po for a total of 14 days given immunosuppresion.    #Anemia - Pt with Hgb trending down from 12 on admission to 8.1.  He has been getting fluids throughout this time. Platelets are normal. His baseline is near 12. He has a normal MCV. History of ESRD, but with normal transplanted kidney at this time. No symptoms currently. No SOB. Surgical site with improved swelling, tenderness, and ecchymosis, no evidence of hematoma.  - Anemia panel: low iron (34), low TIBC (142), low UIBC (108), ferritin elevated (720);retic normal. Haptoglobin High  - CBC with stable Hgb today - Low threshold to transfuse if Hgb continues to drop < 7.  - Consider FOBT if able to have BM.  - Ambulate with assistance and PT eval and treat for weakness.   Constipation: Patient has not had a bowel movement since his surgery last week. Has gone over 1 week without BM. Refuses enema. Needs BM prior to discharge. - increase miralax to 17g TID scheduled - encourage enema if patient will allow - sennakot - PRN Doculax supp - Ambulate with assistance.   #ESRD s/p transplant: making urine and no suggestion of rejection. Spoke with renal (Dr. Moshe Cipro) about continue medication in light of infection. Will continue meds for now.  - continue prednisone and prograf.  - Creatinine 1.61 > 1.37>1.45>1.62 - GFR stage IIIA - Protein on U/A. Will likely need repeat as an outpatient.   #Chest pain: reporting some chest pain but patient is not a good historian. EKG showing sinus tachycardia. ACS ruled out. HEART score 2 at most.  - troponin negative.  - EKG with sinus tachycardia.  - telemetry without acute changes at this time.   #Tuberous Sclerosis: led to his kidney failure and likely seizure d/o.   #Left neck mass: reports that he has been followed by  ENT as an outpatient. States that it is a result of his Tuberous sclerosis. No problems breathing or swallowing. Previously with an abscess in the area that was I&D.  - Previously seen by ENT, but elected not to have removed.   #HTN: controlled  - continue medications   #Seizure d/o: unsure  of last seizure  - will continue depakote 500qhs.   FEN/GI: regular diet  Prophylaxis: subQ hep   Disposition: Home once improving, also needs BM prior to discharge  Subjective:  Pt seems to be doing well. Still has not had BM in over one week. Got up and ambulated with PT, who recommended rolling walker for assistance with ambulation.  Objective: Temp:  [98.5 F (36.9 C)-98.9 F (37.2 C)] 98.5 F (36.9 C) (03/13 0528) Pulse Rate:  [82-94] 82 (03/13 0528) Resp:  [16-17] 16 (03/13 0528) BP: (122-134)/(71-83) 122/71 mmHg (03/13 0528) SpO2:  [94 %-95 %] 94 % (03/13 0528) Weight:  [142 lb 1.6 oz (64.456 kg)] 142 lb 1.6 oz (64.456 kg) (03/12 2132) Physical Exam: General: NAD Cardiovascular: RRR, 2/6 Systolic ejection murmur  Respiratory: CTA Bilaterally, Unlabored, Appropriate rate  Abdomen: soft NTTP Extremities: No drainage from incision site, no evidence of cellulitis directly around incision site. No appreciable lower extremity edema bilaterally  Neuro: grossly nonfocal, speech normal  Laboratory:  Recent Labs Lab 07/24/14 1440 07/25/14 1055 07/26/14 0622  WBC 7.8 7.2 8.8  HGB 8.6* 8.6* 8.8*  HCT 25.5* 25.2* 26.2*  PLT 308 311 336    Recent Labs Lab 07/24/14 0551 07/25/14 0623 07/26/14 0622  NA 139 138 137  K 3.7 3.4* 4.4  CL 106 103 103  CO2 25 26 26   BUN <5* 9 15  CREATININE 1.37* 1.45* 1.62*  CALCIUM 8.4 9.4 9.5  GLUCOSE 109* 121* 109*   EKG - NSR  Troponin - Neg. x3  Imaging/Diagnostic Tests: LUE Venous Duplex 3/8 : DVT of left subclavian and left superficial vein thrombosis.   Leeanne Rio, MD 07/26/2014, 9:47 AM PGY-3, [place] Intern pager: [phone], text pages welcome

## 2014-07-26 NOTE — Care Management Note (Signed)
    Page 1 of 2   07/26/2014     12:29:08 PM CARE MANAGEMENT NOTE 07/26/2014  Patient:  Tyler Alvarez, Tyler Alvarez   Account Number:  [id number]  Date Initiated:  07/24/2014  Documentation initiated by:  ROYAL,CHERYL  Subjective/Objective Assessment:   CM following for progression and d/c planning.     Action/Plan:   07/24/14 Met with pt and mother no d/c needs at this time, IM given.   Anticipated DC Date:  07/26/2014   Anticipated DC Plan:  [place]  CM consult      [facility] Choice  HOME HEALTH   Choice offered to / List presented to:  C-1 Patient   DME arranged  Vassie Moselle      DME agency  [place] arranged  [place].   Status of service:  Completed, signed off Medicare Important Message given?  YES (If response is "NO", the following Medicare IM given date fields will be blank) Date Medicare IM given:  07/24/2014 Medicare IM given by:  ROYAL,CHERYL Date Additional Medicare IM given:   Additional Medicare IM given by:    Discharge Disposition:  [place]  Per UR Regulation:    If discussed at Long Length of Stay Meetings, dates discussed:    Comments:  07/26/14 11:00 Cm met with pt in room to offer choice for home health agency.  Pt and pt's mother choose [facility] to render HHPT.  Address and contact informaiton verified by Mom.  Rolling walker already delivered to room by [facility] DME rep, Jeneen Rinks.  Referral called to [facility] rep, Emma.  No other CM needs were communicated. Mariane Masters, BSN, CM [phone].

## 2014-07-27 ENCOUNTER — Telehealth: Payer: Self-pay | Admitting: Surgery

## 2014-07-27 ENCOUNTER — Inpatient Hospital Stay (HOSPITAL_COMMUNITY): Payer: Medicare Other

## 2014-07-27 DIAGNOSIS — K5900 Constipation, unspecified: Secondary | ICD-10-CM | POA: Insufficient documentation

## 2014-07-27 LAB — CULTURE, BLOOD (ROUTINE X 2)
CULTURE: NO GROWTH
Culture: NO GROWTH

## 2014-07-27 LAB — CBC WITH DIFFERENTIAL/PLATELET
Basophils Absolute: 0 10*3/uL (ref 0.0–0.1)
Basophils Relative: 0 % (ref 0–1)
Eosinophils Absolute: 0.2 10*3/uL (ref 0.0–0.7)
Eosinophils Relative: 2 % (ref 0–5)
HEMATOCRIT: 25.7 % — AB (ref 39.0–52.0)
Hemoglobin: 8.6 g/dL — ABNORMAL LOW (ref 13.0–17.0)
LYMPHS PCT: 26 % (ref 12–46)
Lymphs Abs: 2.7 10*3/uL (ref 0.7–4.0)
MCH: 29.2 pg (ref 26.0–34.0)
MCHC: 33.5 g/dL (ref 30.0–36.0)
MCV: 87.1 fL (ref 78.0–100.0)
MONO ABS: 0.9 10*3/uL (ref 0.1–1.0)
Monocytes Relative: 9 % (ref 3–12)
Neutro Abs: 6.6 10*3/uL (ref 1.7–7.7)
Neutrophils Relative %: 63 % (ref 43–77)
Platelets: 354 10*3/uL (ref 150–400)
RBC: 2.95 MIL/uL — AB (ref 4.22–5.81)
RDW: 12.8 % (ref 11.5–15.5)
WBC: 10.5 10*3/uL (ref 4.0–10.5)

## 2014-07-27 MED ORDER — POLYETHYLENE GLYCOL 3350 17 G PO PACK: 17.0000 g | PACK | Freq: Two times a day (BID) | ORAL | Status: DC

## 2014-07-27 MED ORDER — RIVAROXABAN 20 MG PO TABS: 20.0000 mg | ORAL_TABLET | Freq: Every day | ORAL | Status: DC

## 2014-07-27 MED ORDER — RIVAROXABAN 15 MG PO TABS: 15.0000 mg | ORAL_TABLET | Freq: Two times a day (BID) | ORAL | Status: DC

## 2014-07-27 MED ORDER — DOXYCYCLINE HYCLATE 100 MG PO TABS: 100.0000 mg | ORAL_TABLET | Freq: Two times a day (BID) | ORAL | Status: DC

## 2014-07-27 NOTE — Telephone Encounter (Addendum)
-----   Message from Denman George, RN sent at 07/27/2014  1:42 PM EDT ----- Regarding: Zigmund Daniel log; also needs 1 wk. f/u with Dr. Trula Slade   ----- Message -----    From: Ulyses Amor, PA-C    Sent: 07/27/2014   8:39 AM      To: Vvs Charge Pool  S/P excision of Left AV fistula re-admitted for cellulitis and DVT.  F/U with Dr. Trula Slade in 1 week.  07/27/14: lm for pt re appt, dpm

## 2014-07-27 NOTE — Discharge Summary (Signed)
[facility] Discharge Summary  Patient name: Tyler Alvarez Medical record number: [id number] Date of birth: November 11, 1975 Age: 39 y.o. Gender: male Date of Admission: 07/20/2014  Date of Discharge: 07/27/2014 Admitting Physician: Zenia Resides, MD  Primary Care Provider: Conni Slipper, MD Consultants: Vascular surgery  Indication for Hospitalization: DVT, cellulitis, thrombophlebitis of left upper extremity.  Discharge Diagnoses/Problem List:  DVT of left subclavian Thrombophlebitis of remaining left upper extremity segments status post aneurysmal resection of left AV fistula ADD Tuberous sclerosis ESRD status post kidney transplant with successful graft Seizure disorder Hypertension  Disposition: Discharge home  Discharge Condition: Stable  Discharge Exam:  General: NAD, AAOx3 Cardiovascular: RRR, 2/6 Systolic ejection murmur , No gallops / rubs Respiratory: CTA Bilaterally, Unlabored, Appropriate rate  Abdomen: soft NT, ND, +BS normoactive Extremities: No drainage from incision site, no evidence of cellulitis directly around incision site. No appreciable peripheral edema, TTP over proximal lUE continues, but is improved. No erythema.  Neuro: grossly nonfocal, speech normal   Brief Hospital Course:  Tyler Alvarez is a 39 y.o. male who presented with left upper extremity cellulitis vs. thrombophlebitis with recent excision of large aneurysmal upper arm AV fistula. 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   He was admitted to the hospital for IV antibiotics, imaging of his left upper extremity for evaluation of potential DVT. Vital signs were stable, that he was initially febrile to 102 on admission. Venous duplex of the left upper extremity revealed DVT of the left subclavian vein and superficial  thrombophlebitis. Patient is on chronic immunosuppression for his kidney transplant. He was started on IV vancomycin. His physical exam was remarkable for redness of his left upper extremity and significant swelling. He was started on Cipro also for treatment of his DVT. His physical exam rapidly improved with treatment of the DVT as well as treat with antibiotics. Blood cultures did not grow any bacteria. Signs are stable throughout his hospitalization that he was intermittently febrile likely from his thrombophlebitis. He did experience constipation during this admission, this resolved with enema and Dulcolax as well as senna. His kidney function remained stable throughout his hospitalization, and his antirejection medications were continued. He is found to be safe and stable for discharge with close follow-up with vascular surgery. He was to continue doxycycline for total of 14 days of antibiotics. He is also to continue xarelto for 21 days on 15 mg twice daily and then 20 mg to continue treatment for 3 months total. On day of discharge he was found to have significant improvement in his physical exam from admission. He was found to be safe and stable for discharge to home with follow-up with vascular surgery.  Issues for Follow Up:  1. Follow-up improvement in left upper extremity swelling, erythema, tenderness. 2. Follow-up compliance with xarelto 3. Follow compliance with antibiotics  Significant Procedures: None  Significant Labs and Imaging:   Recent Labs Lab 07/25/14 1055 07/26/14 0622 07/27/14 0609  WBC 7.2 8.8 10.5  HGB 8.6* 8.8* 8.6*  HCT 25.2* 26.2* 25.7*  PLT 311 336 354    Recent Labs Lab 07/22/14 1029 07/23/14 0715 07/24/14 0551 07/25/14 0623 07/26/14 0622  NA 137 140 139 138 137  K 3.6 3.8 3.7 3.4* 4.4  CL 107 109 106 103 103  CO2 24 24 25 26 26   GLUCOSE 125* 105* 109* 121* 109*  BUN 12 10 <5* 9 15  CREATININE 1.44* 1.33 1.37* 1.45* 1.62*  CALCIUM 8.7 8.6 8.4 9.4  9.5    LUE Venous Duplex 3/8 : DVT of left subclavian and left superficial vein thrombosis.    Results/Tests Pending at Time of Discharge: None  Discharge Medications:    Medication List    ASK your doctor about these medications        acetaminophen 325 MG tablet  Commonly known as:  TYLENOL  Take 650 mg by mouth every 6 (six) hours as needed for mild pain.     amLODipine 5 MG tablet  Commonly known as:  NORVASC  Take 5 mg by mouth daily.     aspirin 81 MG chewable tablet  Chew 81 mg by mouth daily.     brompheniramine-pseudoephedrine 1-15 MG/5ML Elix  Commonly known as:  DIMETAPP  Take 5 mLs by mouth 2 (two) times daily as needed for allergies.     divalproex 250 MG DR tablet  Commonly known as:  DEPAKOTE  Take 500 mg by mouth every evening.     doxycycline 100 MG tablet  Commonly known as:  VIBRA-TABS  Take 1 tablet (100 mg total) by mouth 2 (two) times daily.     gabapentin 300 MG capsule  Commonly known as:  NEURONTIN  Take 300 mg by mouth daily.     imipramine 25 MG tablet  Commonly known as:  TOFRANIL  Take 50 mg by mouth at bedtime.     omeprazole 20 MG capsule  Commonly known as:  PRILOSEC  Take 20 mg by mouth daily.     oxyCODONE 5 MG immediate release tablet  Commonly known as:  Oxy IR/ROXICODONE  Take 1 tablet (5 mg total) by mouth every 6 (six) hours as needed for severe pain.     predniSONE 5 MG tablet  Commonly known as:  DELTASONE  Take 5 mg by mouth daily.     tacrolimus 1 MG capsule  Commonly known as:  PROGRAF  Take 2 mg by mouth 2 (two) times daily.        Discharge Instructions: Please refer to Patient Instructions section of EMR for full details.  Patient was counseled important signs and symptoms that should prompt return to medical care, changes in medications, dietary instructions, activity restrictions, and follow up appointments.   Follow-Up Appointments:     Follow-up Information    Follow up with [facility].   Why:  rolling walker and home health physical therapy   Contact information:   [place] [address] [phone]       Follow up with Ramond Craver, [place], MD. Schedule an appointment as soon as possible for a visit in 1 week.   Specialty:  Vascular Surgery   Why:  sent info to office   Contact information:   [place] [address] [phone]       Follow up with Conni Slipper, MD.   Specialty:  [facility]   Contact information:   [place] [address] [phone]       Aquilla Hacker, MD 07/27/2014, 12:45 PM PGY-1, [place]

## 2014-07-27 NOTE — Progress Notes (Signed)
[facility]  Subjective  - Patient is ready to be discharged.   Objective 120/80 88 98.3 F (36.8 C) (Oral) 18 95%  Intake/Output Summary (Last 24 hours) at 07/27/14 0835 Last data filed at 07/27/14 0200  Gross per 24 hour  Intake    120 ml  Output   1450 ml  Net  -1330 ml    Left upper extremity erythema/cellulitis has resolved. Ecchymosis surrounding incision. Palpable radial pulse left UE   Assessment/Planning: Resection of left BC AVF aneurysm 07/16/14 readmitted with superficial thrombophlebitis  Resolved cellulitis left UE Xarelto on board F/U in 1 weeks with Dr. Fae Pippin, [place] 07/27/2014 8:35 AM --  Laboratory Lab Results:  Recent Labs  07/26/14 0622 07/27/14 0609  WBC 8.8 10.5  HGB 8.8* 8.6*  HCT 26.2* 25.7*  PLT 336 354   BMET  Recent Labs  07/25/14 0623 07/26/14 0622  NA 138 137  K 3.4* 4.4  CL 103 103  CO2 26 26  GLUCOSE 121* 109*  BUN 9 15  CREATININE 1.45* 1.62*  CALCIUM 9.4 9.5    COAG Lab Results  Component Value Date   INR 1.09 07/16/2014   No results found for: PTT

## 2014-07-27 NOTE — Progress Notes (Signed)
Physical Therapy Treatment Patient Details Name: Tyler Alvarez MRN: [id number] DOB: 1975-07-27 Today's Date: 07/27/2014    History of Present Illness Pt is a 39 y.o. male presenting with left upper extremity cellulitis with recent excision of large aneurysmal upper arm AV fistula. 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     PT Comments    Pt progressing towards physical therapy goals. Was able to improve ambulation distance and technique this session, and managed RW well. Encouraged active use of LUE. Pt was educated on HEP and walking program for home. Will continue to follow and progress as able per POC.   Follow Up Recommendations  Home health PT;Supervision/Assistance - 24 hour     Equipment Recommendations  Rolling walker with 5" wheels    Recommendations for Other Services       Precautions / Restrictions Precautions Precautions: Fall Restrictions Weight Bearing Restrictions: No    Mobility  Bed Mobility Overal bed mobility: Needs Assistance Bed Mobility: Supine to Sit     Supine to sit: Supervision     General bed mobility comments: Supervision for safety as pt dizzy with positional changes. Use of bed rail and increased time required.   Transfers Overall transfer level: Needs assistance Equipment used: Rolling walker (2 wheeled) Transfers: Sit to/from Stand Sit to Stand: Supervision         General transfer comment: Pt was able to power-up to full stand with and without RW and supervision for safety. Pt appears unsteady however does not require assistance to recover.   Ambulation/Gait Ambulation/Gait assistance: Min guard Ambulation Distance (Feet): 240 Feet Assistive device: Rolling walker (2 wheeled) Gait Pattern/deviations: Step-through pattern;Decreased stride length;Trunk flexed Gait velocity:  Decreased Gait velocity interpretation: Below normal speed for age/gender General Gait Details: Pt was able to improve ambulation distance this session. Reports dizziness about 200 feet into walk but was able to return to room without increase in dizziness or balance issues.    Stairs            Wheelchair Mobility    Modified Rankin (Stroke Patients Only)       Balance Overall balance assessment: Needs assistance Sitting-balance support: Feet supported;No upper extremity supported Sitting balance-Leahy Scale: Fair     Standing balance support: No upper extremity supported;During functional activity Standing balance-Leahy Scale: Fair                      Cognition Arousal/Alertness: Awake/alert Behavior During Therapy: WFL for tasks assessed/performed Overall Cognitive Status: Impaired/Different from baseline Area of Impairment: Orientation;Attention;Safety/judgement Orientation Level: Disoriented to;Time Current Attention Level: Alternating     Safety/Judgement: Decreased awareness of safety;Decreased awareness of deficits          Exercises General Exercises - Lower Extremity Ankle Circles/Pumps: 10 reps Quad Sets: 10 reps Long Arc Quad: 10 reps Hip ABduction/ADduction: 10 reps    General Comments        Pertinent Vitals/Pain Pain Assessment: Faces Faces Pain Scale: Hurts a little bit Pain Location: L incision site Pain Descriptors / Indicators: Operative site guarding Pain Intervention(s): Limited activity within patient's tolerance;Monitored during session;Repositioned    Home Living                      Prior Function            PT Goals (current goals can now  be found in the care plan section) Acute Rehab PT Goals Patient Stated Goal: Feel better PT Goal Formulation: With patient/family Time For Goal Achievement: 08/02/14 Potential to Achieve Goals: Good Progress towards PT goals: Progressing toward goals     Frequency  Min 3X/week    PT Plan Current plan remains appropriate    Co-evaluation             End of Session Equipment Utilized During Treatment: Gait belt Activity Tolerance: Patient tolerated treatment well Patient left: in chair;with call bell/phone within reach;with family/visitor present     Time: 1433-1450 PT Time Calculation (min) (ACUTE ONLY): 17 min  Charges:  $Gait Training: 8-22 mins                    G Codes:      Rolinda Roan 20-Aug-2014, 2:55 PM   Rolinda Roan, PT, DPT Acute Rehabilitation Services Pager: [phone]

## 2014-07-27 NOTE — Discharge Instructions (Addendum)
Information on my medicine - XARELTO (rivaroxaban)  This medication education was reviewed with me or my healthcare representative as part of my discharge preparation.  The pharmacist that spoke with me during my hospital stay was:  Elicia Lamp, PharmD  WHY WAS [place]? Xarelto was prescribed to treat blood clots that may have been found in the veins of your legs (deep vein thrombosis) or in your lungs (pulmonary embolism) and to reduce the risk of them occurring again.  What do you need to know about Xarelto? The starting dose is one 15 mg tablet taken TWICE daily with food for the FIRST 21 DAYS then on 08/15/14  the dose is changed to one 20 mg tablet taken ONCE A DAY with your evening meal.  DO NOT stop taking Xarelto without talking to the health care provider who prescribed the medication.  Refill your prescription for 20 mg tablets before you run out.  After discharge, you should have regular check-up appointments with your healthcare provider that is prescribing your Xarelto.  In the future your dose may need to be changed if your kidney function changes by a significant amount.  What do you do if you miss a dose? If you are taking Xarelto TWICE DAILY and you miss a dose, take it as soon as you remember. You may take two 15 mg tablets (total 30 mg) at the same time then resume your regularly scheduled 15 mg twice daily the next day.  If you are taking Xarelto ONCE DAILY and you miss a dose, take it as soon as you remember on the same day then continue your regularly scheduled once daily regimen the next day. Do not take two doses of Xarelto at the same time.   Important Safety Information Xarelto is a blood thinner medicine that can cause bleeding. You should call your healthcare provider right away if you experience any of the following: ? Bleeding from an injury or your nose that does not stop. ? Unusual colored urine (red or dark brown) or unusual colored  stools (red or black). ? Unusual bruising for unknown reasons. ? A serious fall or if you hit your head (even if there is no bleeding).  Some medicines may interact with Xarelto and might increase your risk of bleeding while on Xarelto. To help avoid this, consult your healthcare provider or pharmacist prior to using any new prescription or non-prescription medications, including herbals, vitamins, non-steroidal anti-inflammatory drugs (NSAIDs) and supplements.  This website has more information on Xarelto: [email or web address]-benson.[email or web address]  If you experience any bleeding, that is uncontrolled, then please go to the Ed for evaluation.   If you begin to have any signs of infection or drainage of your surgical site including redness, swelling, tenderness, or discharge, then please go to the ED for evaluation.   Thanks for letting us take care of you!   Sincerely,  Paula Compton, MD Family Medicine - PGY 1

## 2014-07-27 NOTE — Plan of Care (Signed)
Problem: Discharge Progression Outcomes Goal: Barriers To Progression Addressed/Resolved Outcome: Completed/Met Date Met:  07/27/14 Patient needed to have a BM today, which he did.

## 2014-07-27 NOTE — Progress Notes (Signed)
ANTICOAGULATION CONSULT NOTE - Follow Up Consult  Pharmacy Consult for Xarelto Indication: New LUE DVT  Allergies  Allergen Reactions  . Erythromycin Nausea And Vomiting  . Sulfa Antibiotics Nausea And Vomiting    Other reaction(s): ITCHING  . Sulfacetamide Sodium     Patient Measurements: Height: 6' (182.9 cm) Weight: 142 lb 1.6 oz (64.456 kg) IBW/kg (Calculated) : 77.6  Vital Signs: Temp: 98.5 F (36.9 C) (03/14 0910) Temp Source: Oral (03/14 0910) BP: 126/77 mmHg (03/14 0910) Pulse Rate: 90 (03/14 0910)  Labs:  Recent Labs  07/25/14 0623 07/25/14 1055 07/26/14 0622 07/27/14 0609  HGB  --  8.6* 8.8* 8.6*  HCT  --  25.2* 26.2* 25.7*  PLT  --  311 336 354  CREATININE 1.45*  --  1.62*  --     Estimated Creatinine Clearance: 55.3 mL/min (by C-G formula based on Cr of 1.62).   Assessment: [name] who continues on Xarelto for anticoagulation in the setting of a new acute LUE DVT. SCr 1.62, CrCl~50-60 ml/min - dose remains appropriate. CBC low but stable - no overt s/sx of bleeding noted. The patient was educated on Xarelto this admission.   Goal of Therapy:  Appropriate anticoagulation for the indication and hepatic/renal function   Plan:  1. Continue Xarelto 15 mg bid with meals thru 08/14/14 2. Start Xarelto 20 mg once daily with supper starting on 08/15/14 3. Will continue to monitor renal function for necessary dose adjustments and for any signs/symptoms of bleeding  Alycia Rossetti, PharmD, BCPS Clinical Pharmacist Pager: [phone] 07/27/2014 10:48 AM

## 2014-07-27 NOTE — Progress Notes (Signed)
Patient to be discharged to home. Two PIV's removed per protocol. Discharge instructions reviewed with patient and patient's mother.   Joellen Jersey, RN.

## 2014-07-27 NOTE — Care Management Note (Addendum)
CARE MANAGEMENT NOTE 07/27/2014  Patient:  Tyler Alvarez, Tyler Alvarez   Account Number:  [id number]  Date Initiated:  07/24/2014  Documentation initiated by:  Kioni Stahl  Subjective/Objective Assessment:   CM following for progression and d/c planning.     Action/Plan:   07/24/14 Met with pt and mother no d/c needs at this time, IM given.  07/27/14 HH arranged, pt has a walker, no further DME needs.    Consult for assist with Xarelto, however pt already has a started kit with the 30 day free card and seems to  Understand the program, this CM call MD and asked for 30 day hard prescription as pt will need this to receive  30 day free sample. Pt reminded to active the card.   Anticipated DC Date:  07/27/2014   Anticipated DC Plan:  [place]  CM consult      [facility] Choice  HOME HEALTH   Choice offered to / List presented to:  C-1 Patient   DME arranged  Vassie Moselle      DME agency  [place] arranged  [place].   Status of service:  Completed, signed off Medicare Important Message given?  YES (If response is "NO", the following Medicare IM given date fields will be blank) Date Medicare IM given:  07/24/2014 Medicare IM given by:  Gearld Kerstein Date Additional Medicare IM given:  07/27/2014 Additional Medicare IM given by:  [facility]  Discharge Disposition:  [place]  Per UR Regulation:    If discussed at Long Length of Stay Meetings, dates discussed:    Comments:  07/27/2014 Met with pt and his mother, HH varified with [facility], pt has walker, hopefully will d/c today.  CRoyal RN MPH case manager, [phone]  07/26/14 11:00 Cm met with pt in room to offer choice for home health agency.  Pt and pt's mother choose [facility] to render HHPT.  Address and contact informaiton verified by Mom.  Rolling walker already delivered to room by [facility] DME rep, Jeneen Rinks.  Referral called  to [facility] rep, Emma.  No other CM needs were communicated. Mariane Masters, BSN, CM [phone].

## 2014-07-27 NOTE — Progress Notes (Signed)
Family Medicine Teaching Service Daily Progress Note Intern Pager: [phone]  Patient name: Tyler Alvarez Medical record number: [id number] Date of birth: 1975-05-25 Age: 39 y.o. Gender: male  Primary Care Provider: Conni Slipper, MD Consultants: Vascular Surgery Code Status: Full  Pt Overview and Major Events to Date:  3/7-3/8: Admitted with concern for thrombophlebitis vs. Cellulitis of left arm incision site.  3/9 - 3/10 Continues to be intermittently febrile, but slowly improving. DVT of left subclavian. Watchful waiting surgery vs. Conservative management.  3/11 - Pt. Continues to improve. Home soon pending improvement in anemia.   Assessment and Plan: Tyler Alvarez is a 39 y.o. male who presented with left upper extremity cellulitis vs. thrombophlebitis with recent excision of large aneurysmal upper arm AV fistula. 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   #Cellulitis/Superficial thrombophlebitis: Recent excision of large aneurysmal upper AV fistula on 07/16/14. Presented with erythema, pain and fever 102 and WBC 14. He is on chronic immunosuppression for kidney transplant. Pain and swelling improving. Erythema markedly improved. WBC 14 > 8.  - Vascular surgery consulted> No surgery. Stable to go home on anticoagulation and abx once ready.  - DVT of L subclavian and superficial thrombophlebitis. Heparin Gtt to Xarelto completed.  Xarelto for treatment of DVT at discharge. 15mg  BID for 21 days > 20mg  daily to complete 3 months.  - afebrile  - Blood cultures NGTD x 5 days, urine Cx Negative.   - Watching vitals closely given known DVT with potential for PE. - Vancomycin per pharmacy transitioned to Doxycycline po for a total of 14 days given immunosuppresion.    #Anemia - Pt with Hgb trending down from 12 on admission to 8.1. He has  been getting fluids throughout this time. Platelets are normal. His baseline is near 12. He has a normal MCV. History of ESRD, but with normal transplanted kidney at this time. No symptoms currently. No SOB. Surgical site with improved swelling, tenderness, and ecchymosis, no evidence of hematoma.  - Anemia panel: low iron (34), low TIBC (142), low UIBC (108), ferritin elevated (720);retic normal. Haptoglobin High  - CBC with stable Hgb today 8.6 - Low threshold to transfuse if Hgb continues to drop < 7.   - Ambulate with assistance and PT eval and treat for weakness.   Constipation: Patient has not had a bowel movement since his surgery last week. Has gone over 1 week without BM. Agreeable to Enema. Needs BM prior to discharge. - increase miralax to 17g TID scheduled - enema  - sennakot - PRN Doculax supp - Ambulate with assistance.   #ESRD s/p transplant: making urine and no suggestion of rejection. Spoke with renal (Dr. Moshe Cipro) about continue medication in light of infection. Will continue meds for now.  - continue prednisone and prograf.  - Creatinine 1.61 > 1.37>1.45>1.62 - GFR stage IIIA - Protein on U/A. Will likely need repeat as an outpatient.   #Chest pain: reporting some chest pain but patient is not a good historian. EKG showing sinus tachycardia. ACS ruled out. HEART score 2 at most.  - troponin negative.  - EKG with sinus tachycardia.  - telemetry without acute changes at this time.   #Tuberous Sclerosis: led to his kidney failure and likely seizure d/o.   #Left neck mass: reports that he has been followed by ENT as an outpatient. States that it is a result of  his Tuberous sclerosis. No problems breathing or swallowing. Previously with an abscess in the area that was I&D.  - Previously seen by ENT, but elected not to have removed.   #HTN: controlled  - continue medications   #Seizure d/o: unsure of last seizure  - will continue depakote 500qhs.   FEN/GI:  regular diet  Prophylaxis: subQ hep   Disposition: Home today hopefully.   Subjective:  Pt. Feeling well this morning. Feels that he is improving symptomatically. He is ready to go home, but also feels that he needs to have a BM this am. He says that he might be able to go. He denies abdominal pain. He denies distension. He is hungry. Denies nausea. He has improved tenderness of his RUE. He denies CP / SOB. Passing gas.   Objective: Temp:  [98.1 F (36.7 C)-98.7 F (37.1 C)] 98.3 F (36.8 C) (03/14 0500) Pulse Rate:  [88-92] 88 (03/14 0500) Resp:  [16-18] 18 (03/14 0500) BP: (120-123)/(75-82) 120/80 mmHg (03/14 0500) SpO2:  [95 %-99 %] 95 % (03/14 0500) Physical Exam: General: NAD, AAOx3 Cardiovascular: RRR, 2/6 Systolic ejection murmur , No gallops / rubs Respiratory: CTA Bilaterally, Unlabored, Appropriate rate  Abdomen: soft NT, ND, +BS normoactive Extremities: No drainage from incision site, no evidence of cellulitis directly around incision site. No appreciable peripheral edema, TTP over proximal lUE continues, but is improved. No erythema.  Neuro: grossly nonfocal, speech normal  Laboratory:  Recent Labs Lab 07/25/14 1055 07/26/14 0622 07/27/14 0609  WBC 7.2 8.8 10.5  HGB 8.6* 8.8* 8.6*  HCT 25.2* 26.2* 25.7*  PLT 311 336 354    Recent Labs Lab 07/24/14 0551 07/25/14 0623 07/26/14 0622  NA 139 138 137  K 3.7 3.4* 4.4  CL 106 103 103  CO2 25 26 26   BUN <5* 9 15  CREATININE 1.37* 1.45* 1.62*  CALCIUM 8.4 9.4 9.5  GLUCOSE 109* 121* 109*   EKG - NSR  Troponin - Neg. x3  Imaging/Diagnostic Tests: LUE Venous Duplex 3/8 : DVT of left subclavian and left superficial vein thrombosis.   Aquilla Hacker, MD 07/27/2014, 9:38 AM PGY-1, [place] Intern pager: [phone], text pages welcome

## 2014-07-31 ENCOUNTER — Encounter: Payer: Self-pay | Admitting: Surgery

## 2014-08-03 ENCOUNTER — Encounter: Payer: Self-pay | Admitting: Surgery

## 2014-08-03 ENCOUNTER — Ambulatory Visit (INDEPENDENT_AMBULATORY_CARE_PROVIDER_SITE_OTHER): Payer: Self-pay | Admitting: Surgery

## 2014-08-03 VITALS — BP 119/81 | HR 116 | Ht 72.0 in | Wt 131.0 lb

## 2014-08-03 DIAGNOSIS — I770 Arteriovenous fistula, acquired: Secondary | ICD-10-CM

## 2014-08-03 DIAGNOSIS — Z48812 Encounter for surgical aftercare following surgery on the circulatory system: Secondary | ICD-10-CM

## 2014-08-03 NOTE — Addendum Note (Signed)
Addended by: Mena Goes on: 08/03/2014 03:21 PM   Modules accepted: Orders

## 2014-08-03 NOTE — Progress Notes (Signed)
Patient name: Tyler Alvarez MRN: [id number] DOB: 10-Mar-1976 Sex: male     Chief Complaint  Patient presents with  . Re-evaluation    s/p excision of L AVF - readmitted for cellulitis and DVT    HISTORY OF PRESENT ILLNESS: Asian is back today for follow-up.  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  He has a history of renal transplant and is not currently using his fistula.  It had become very aneurysmal.  I resected approximately one half of the fistula.  Unfortunately, he developed a severe thrombophlebitis from the residual fistula.  This required admission to the hospital.  He was also found to have extension of the thrombus into the subclavian vein.  He was treated for DVT with heparin which was transitioned to [place].  He was also treated with antibiotics for possible cellulitis.  Past Medical History  Diagnosis Date  . Hypertension   . Chronic kidney disease     s/p transplant  . Seizures     treated by Dr. Melrose Nakayama  . ADD (attention deficit disorder)   . Tuberous sclerosis   . Immune deficiency disorder   . Headache     Migraines  . GERD (gastroesophageal reflux disease)     Past Surgical History  Procedure Laterality Date  . Kidney transplant    . Av fistula placement    . Revison of arteriovenous fistula Left 07/16/2014    Procedure: EXCISION ANEURYSMAL AREA OF LEFT ARTERIOVENOUS FISTULA;  Surgeon: Serafina Mitchell, MD;  Location: [place];  Service: Vascular;  Laterality: Left;    History   Social History  . Marital Status: Single    Spouse Name: N/A  . Number of Children: N/A  . Years of Education: N/A   Occupational History  . Not on file.   Social History Main Topics  . Smoking status: Never Smoker   . Smokeless tobacco: Not on file  . Alcohol Use: No  . Drug Use: No  . Sexual Activity: Yes   Other Topics Concern  . Not on file   Social History Narrative    Family History  Problem Relation Age  of Onset  . Cancer Other     Allergies as of 08/03/2014 - Review Complete 08/03/2014  Allergen Reaction Noted  . Erythromycin Nausea And Vomiting 11/08/2010  . Sulfa antibiotics Nausea And Vomiting 11/30/2011  . Sulfacetamide sodium  07/15/2014    Current Outpatient Prescriptions on File Prior to Visit  Medication Sig Dispense Refill  . acetaminophen (TYLENOL) 325 MG tablet Take 650 mg by mouth every 6 (six) hours as needed for mild pain.    Marland Kitchen amLODipine (NORVASC) 5 MG tablet Take 5 mg by mouth daily.     . brompheniramine-pseudoephedrine (DIMETAPP) 1-15 MG/5ML ELIX Take 5 mLs by mouth 2 (two) times daily as needed for allergies.    Marland Kitchen divalproex (DEPAKOTE) 250 MG EC tablet Take 500 mg by mouth every evening.     Marland Kitchen doxycycline (VIBRA-TABS) 100 MG tablet Take 1 tablet (100 mg total) by mouth 2 (two) times daily. 14 tablet 0  . gabapentin (NEURONTIN) 300 MG capsule Take 300 mg by mouth daily.     Marland Kitchen imipramine (TOFRANIL) 25 MG tablet Take 50 mg by mouth at bedtime.     Marland Kitchen omeprazole (PRILOSEC) 20 MG capsule Take 20 mg by mouth daily.      Marland Kitchen oxyCODONE (OXY IR/ROXICODONE) 5 MG immediate release tablet Take 1 tablet (  5 mg total) by mouth every 6 (six) hours as needed for severe pain. 30 tablet 0  . polyethylene glycol (MIRALAX / GLYCOLAX) packet Take 17 g by mouth 2 (two) times daily. 14 each 0  . predniSONE (DELTASONE) 5 MG tablet Take 5 mg by mouth daily.     . Rivaroxaban (XARELTO) 15 MG TABS tablet Take 1 tablet (15 mg total) by mouth 2 (two) times daily with a meal. 34 tablet 0  . [START ON 08/15/2014] rivaroxaban (XARELTO) 20 MG TABS tablet Take 1 tablet (20 mg total) by mouth daily with supper. 65 tablet 0  . tacrolimus (PROGRAF) 1 MG capsule Take 2 mg by mouth 2 (two) times daily.      No current facility-administered medications on file prior to visit.     REVIEW OF SYSTEMS: Please see history of present illness, otherwise negative  PHYSICAL EXAMINATION:   Vital signs are BP 119/81  mmHg  Pulse 116  Ht 6' (1.829 m)  Wt 131 lb (59.421 kg)  BMI 17.76 kg/m2  SpO2 98% General: The patient appears their stated age. HEENT:  No gross abnormalities Pulmonary:  Non labored breathing Neurologic: No focal weakness or paresthesias are detected, Skin: There are no ulcer or rashes noted. Psychiatric: The patient has normal affect. Cardiovascular: Palpable radial pulse Dramatic improvement within the cellulitic area.  There is no longer redness on the left upper arm and chest.  A palpable cord from the thrombosed fistula, however this is nontender.  His incision has healed.   Diagnostic Studies None  Assessment: Status post removal of fistula aneurysm, left Plan: The patient's arm has dramatically improved.  He did get diagnosed with a DVT.  He is on Xaralto for this.  He is not on antibiotics any more and there is no evidence of cellulitis.  I have scheduled the patient to follow up in 3 months with a repeat ultrasound to evaluate his DVT.  If this has resolved, he will likely be able to come off anticoagulation at that time.  Eldridge Abrahams, M.D. [facility] Office: [phone] Pager:  [phone]

## 2014-08-11 ENCOUNTER — Inpatient Hospital Stay: Payer: Medicare Other | Admitting: Family Medicine

## 2014-08-24 ENCOUNTER — Encounter: Payer: Self-pay | Admitting: *Deleted

## 2014-08-24 NOTE — Progress Notes (Signed)
Prior Authorization received from [facility] for [place].  PA form placed in provider box for completion. Derl Barrow, RN

## 2014-08-25 NOTE — Progress Notes (Signed)
Filled out and placed in Tamika's box.  Hilton Sinclair, MD

## 2014-08-26 ENCOUNTER — Encounter: Payer: Self-pay | Admitting: Family Medicine

## 2014-08-26 ENCOUNTER — Emergency Department (HOSPITAL_COMMUNITY)
Admission: EM | Admit: 2014-08-26 | Discharge: 2014-08-26 | Disposition: A | Discharge status: Home / Self Care | Payer: Medicare Other | Attending: Emergency Medicine | Admitting: Emergency Medicine

## 2014-08-26 ENCOUNTER — Ambulatory Visit (HOSPITAL_COMMUNITY)
Admission: RE | Admit: 2014-08-26 | Discharge: 2014-08-26 | Disposition: A | Discharge status: Home / Self Care | Payer: Medicare Other | Source: Ambulatory Visit | Attending: Family Medicine | Admitting: Family Medicine

## 2014-08-26 ENCOUNTER — Encounter (HOSPITAL_COMMUNITY): Payer: Self-pay | Admitting: *Deleted

## 2014-08-26 ENCOUNTER — Ambulatory Visit (INDEPENDENT_AMBULATORY_CARE_PROVIDER_SITE_OTHER): Payer: Medicare Other | Admitting: Family Medicine

## 2014-08-26 VITALS — BP 141/91 | HR 99 | Temp 98.2°F | Ht 72.0 in | Wt 137.1 lb

## 2014-08-26 DIAGNOSIS — Z940 Kidney transplant status: Secondary | ICD-10-CM | POA: Diagnosis not present

## 2014-08-26 DIAGNOSIS — R2232 Localized swelling, mass and lump, left upper limb: Secondary | ICD-10-CM

## 2014-08-26 DIAGNOSIS — I742 Embolism and thrombosis of arteries of the upper extremities: Secondary | ICD-10-CM

## 2014-08-26 DIAGNOSIS — M7989 Other specified soft tissue disorders: Secondary | ICD-10-CM

## 2014-08-26 DIAGNOSIS — Z862 Personal history of diseases of the blood and blood-forming organs and certain disorders involving the immune mechanism: Secondary | ICD-10-CM | POA: Diagnosis not present

## 2014-08-26 DIAGNOSIS — R2230 Localized swelling, mass and lump, unspecified upper limb: Secondary | ICD-10-CM | POA: Insufficient documentation

## 2014-08-26 DIAGNOSIS — I129 Hypertensive chronic kidney disease with stage 1 through stage 4 chronic kidney disease, or unspecified chronic kidney disease: Secondary | ICD-10-CM | POA: Insufficient documentation

## 2014-08-26 DIAGNOSIS — Z79899 Other long term (current) drug therapy: Secondary | ICD-10-CM | POA: Insufficient documentation

## 2014-08-26 DIAGNOSIS — Q851 Tuberous sclerosis: Secondary | ICD-10-CM | POA: Diagnosis not present

## 2014-08-26 DIAGNOSIS — Z8669 Personal history of other diseases of the nervous system and sense organs: Secondary | ICD-10-CM | POA: Diagnosis not present

## 2014-08-26 DIAGNOSIS — G40909 Epilepsy, unspecified, not intractable, without status epilepticus: Secondary | ICD-10-CM | POA: Insufficient documentation

## 2014-08-26 DIAGNOSIS — Z7952 Long term (current) use of systemic steroids: Secondary | ICD-10-CM | POA: Diagnosis not present

## 2014-08-26 DIAGNOSIS — M79602 Pain in left arm: Secondary | ICD-10-CM | POA: Diagnosis present

## 2014-08-26 DIAGNOSIS — Z792 Long term (current) use of antibiotics: Secondary | ICD-10-CM | POA: Insufficient documentation

## 2014-08-26 DIAGNOSIS — K219 Gastro-esophageal reflux disease without esophagitis: Secondary | ICD-10-CM | POA: Diagnosis not present

## 2014-08-26 DIAGNOSIS — N189 Chronic kidney disease, unspecified: Secondary | ICD-10-CM | POA: Insufficient documentation

## 2014-08-26 NOTE — ED Notes (Addendum)
Pt had an US performed on his left upper extremeties , results showed thrombus noted in the brachial artery in the distal upper arm. Pt has old fistula in left upper arm. Pt reports symptoms left shoulder pain. Pt presents with baseball size left upper arm knot. Distal pulses intact. Pt denies shortness of breath, chest pain.

## 2014-08-26 NOTE — Progress Notes (Signed)
VASCULAR LAB PRELIMINARY  PRELIMINARY  PRELIMINARY  PRELIMINARY  Left upper extremity venous duplex completed.    Preliminary report:  Previous DVT in the left subclavian vein appears resolved.  Superficial thrombosis of the origin of the cepalic vein off the subclavian vein.  Large mass consisting of mixed echoes from the mid clavicle area to the upper arm.  There is vascularization noted within this mass.    Incidental finding:  Thrombus noted in the brachial artery in the distal upper arm.  Thumpy waveforms proximal to thrombus.  Radial and ulnar arteries appear normal with triphasic waveforms.  There appears to be a large collateral off the brachal artery.  Orlandus Borowski, RVT 08/26/2014, 5:34 PM

## 2014-08-26 NOTE — Progress Notes (Signed)
Patient ID: Tyler Alvarez, male   DOB: 1975-12-11, 39 y.o.   MRN: [id number] Subjective:   CC: Hospital follow-up  HPI:   Patient presents for a hospital follow-up. He was admitted 3/7-3/14/2016 for DVT, cellulitis, and thrombophlebitis of left upper extremity. He received IV antibiotics and anticoagulation, transitioned to Xarelto with plan to transition to 20 mg daily for 3 months total. He was also supposed to continue doxycycline for total 14 days.   Since that time, he states he completed the antibiotic course and has been taking Xarelto daily, only missing 1 dose due to feeling nauseous. He and mother have however felt that the left arm has developed a large firm mass over biceps that was slightly there before but has definitely grown quickly. There are 2 smaller masses that have also developed on upper arm and medial to antecubital fossa. They deny fevers, chills, warmth, or tenderness. He does not report any numbness or tingling in the arm. Full range of motion. He thinks the area medial to his deltoid is swelling because he hit it on a door frame.  He is somewhat of a poor historian.  Review of Systems - Per HPI.   PMH - history of aneurysm, cellulitis, end-stage renal disease with history of renal transplant, hypertension, left hearing loss, normocytic anemia, postoperative thrombophlebitis, tuberous sclerosis, and recent DVT    Objective:  Physical Exam BP 141/91 mmHg  Pulse 99  Temp(Src) 98.2 F (36.8 C) (Oral)  Ht 6' (1.829 m)  Wt 137 lb 1.6 oz (62.188 kg)  BMI 18.59 kg/m2 GEN: NAD Left upper extremity: Well-healed linear scar distal to left bicep, no warmth or purulence or tenderness, no surrounding erythema Just medial to deltoid is a 2 cm relatively firm round mass palpated under the skin that is mildly tender and very mildly erythematous Below this over his biceps area is a large similarly firm circumscribed mass that is slightly mobile and uniformly firm that is about  6-7 cm in diameter, nontender, no erythema or induration Just medial to antecubital fossa is another similar mass that is about 2 cm in diameter, nontender, no overlying erythema or induration These areas have no associated bruit on auscultation and no thrill.   Consent signed by pt for photo upload.     Assessment:     Tyler Alvarez is a 39 y.o. male here for hospital follow up.    Plan:     # See problem list and after visit summary for problem-specific plans.  Left arm mass With a history of DVT in the left arm, concerned that this is either worsening of DVT despite treatment or other mass. Discussed with vascular surgeon and feel that this could also be a nonfunctional fistula with aneurysm which would be benign. Patient has minimal pain in 2 out of the 3 masses but some pain with the most proximal mass. -Marked all 3 areas and ordered left upper extremity venous duplex along with left upper extremity limited ultrasound. -Follow-up in one week or sooner if worsening symptoms. -**Addendum: Received call from vascular lab tech Helene at 5:22pm that patient has a left brachial artery thrombus and vascular lab tech feels that left hand is colder than right with decreased arterial pulse. Prior subclavian vein thrombus appears resolved. Also some other nondescript mass with some vascularization. Awaiting official read. Asked her to send patient to ED after discussing with late attending Dr. Mingo Amber. Notified emergency room the patient is coming and asked them to please page  vascular surgeon on-call [phone] once pt roomed. Notified vascular surgeon as well. Gave FYI to [facility] team that patient is a likely admission. Upon arrival to ED, will need CBC, metabolic panel, PT, PTT, INR.  Follow-up: Sending patient to ED from vascular lab.   Hilton Sinclair, MD [place]

## 2014-08-26 NOTE — ED Notes (Signed)
Pt called x1. No answer. 

## 2014-08-26 NOTE — Progress Notes (Signed)
PA faxed to [employer] for review.  Derl Barrow, RN   Received PA approval for Xarelto via [employer].  Med approved for 08/26/14 - 08/26/15.  [facility] informed.  PA confirmation number [id number]. Derl Barrow, RN

## 2014-08-26 NOTE — Patient Instructions (Signed)
It was good to see you today. I am unsure what the swelling in your left arm is. It does not look like an infection because you have no signs of infection at this time. However if you develop fevers, increased swelling, increased pain, redness, or warmth, seek immediate care. I am a little concerned that this may be a worsening of your clot. I will send a message to Dr. Trula Slade and we are also getting ultrasounds of the left arm today. Follow-up in one week or sooner if needed.

## 2014-08-26 NOTE — Consult Note (Signed)
[facility] NOTE   MRN : [id number]  Reason for Consult: upper arm fistula excision Referring Physician: ED  History of Present Illness:39 y/o  Asian male is back today for follow-up. 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 He has a history of renal transplant and is not currently using his fistula. It had become very aneurysmal. I resected approximately one half of the fistula. Unfortunately, he developed a severe thrombophlebitis from the residual fistula. This required admission to the hospital. He was also found to have extension of the thrombus into the subclavian vein. He was treated for DVT with heparin which was transitioned to [place]. He was also treated with antibiotics for possible cellulitis.  He reported to his family medicine physician to for f/u and they were concerned about the edema in the superior aspect of the old fistula.  He reports no left hand numbness or pain.        No current facility-administered medications for this encounter.   Current Outpatient Prescriptions  Medication Sig Dispense Refill  . acetaminophen (TYLENOL) 325 MG tablet Take 650 mg by mouth every 6 (six) hours as needed for mild pain.    Marland Kitchen amLODipine (NORVASC) 5 MG tablet Take 5 mg by mouth daily.     . brompheniramine-pseudoephedrine (DIMETAPP) 1-15 MG/5ML ELIX Take 5 mLs by mouth 2 (two) times daily as needed for allergies.    Marland Kitchen divalproex (DEPAKOTE) 250 MG EC tablet Take 500 mg by mouth every evening.     Marland Kitchen doxycycline (VIBRA-TABS) 100 MG tablet Take 1 tablet (100 mg total) by mouth 2 (two) times daily. 14 tablet 0  . gabapentin (NEURONTIN) 300 MG capsule Take 300 mg by mouth daily.     Marland Kitchen imipramine (TOFRANIL) 25 MG tablet Take 50 mg by mouth at bedtime.     Marland Kitchen omeprazole (PRILOSEC) 20 MG capsule Take 20 mg by mouth daily.      Marland Kitchen oxyCODONE (OXY IR/ROXICODONE) 5 MG immediate release tablet Take 1 tablet  (5 mg total) by mouth every 6 (six) hours as needed for severe pain. 30 tablet 0  . polyethylene glycol (MIRALAX / GLYCOLAX) packet Take 17 g by mouth 2 (two) times daily. 14 each 0  . predniSONE (DELTASONE) 5 MG tablet Take 5 mg by mouth daily.     . Rivaroxaban (XARELTO) 15 MG TABS tablet Take 1 tablet (15 mg total) by mouth 2 (two) times daily with a meal. 34 tablet 0  . rivaroxaban (XARELTO) 20 MG TABS tablet Take 1 tablet (20 mg total) by mouth daily with supper. 65 tablet 0  . tacrolimus (PROGRAF) 1 MG capsule Take 2 mg by mouth 2 (two) times daily.       Pt meds include: Statin :No Betablocker: No ASA: No Other anticoagulants/antiplatelets: xarelto  Past Medical History  Diagnosis Date  . Hypertension   . Chronic kidney disease     s/p transplant  . Seizures     treated by Dr. Melrose Nakayama  . ADD (attention deficit disorder)   . Tuberous sclerosis   . Immune deficiency disorder   . Headache     Migraines  . GERD (gastroesophageal reflux disease)     Past Surgical History  Procedure Laterality Date  . Kidney transplant    . Av fistula placement    . Revison of arteriovenous fistula Left 07/16/2014    Procedure: EXCISION ANEURYSMAL AREA OF LEFT ARTERIOVENOUS FISTULA;  Surgeon: Serafina Mitchell,  MD;  Location: [facility] OR;  Service: Vascular;  Laterality: Left;    Social History History  Substance Use Topics  . Smoking status: Never Smoker   . Smokeless tobacco: Not on file  . Alcohol Use: No    Family History Family History  Problem Relation Age of Onset  . Cancer Other     Allergies  Allergen Reactions  . Erythromycin Nausea And Vomiting  . Sulfa Antibiotics Nausea And Vomiting    Other reaction(s): ITCHING  . Sulfacetamide Sodium      REVIEW OF SYSTEMS  General: [ ]  Weight loss, [ ]  Fever, [ ]  chills Neurologic: [ ]  Dizziness, [ ]  Blackouts, [ ]  Seizure [ ]  Stroke, [ ]  "Mini stroke", [ ]  Slurred speech, [ ]  Temporary blindness; [ ]  weakness in arms or legs, [ ]   Hoarseness [ ]  Dysphagia Cardiac: [ ]  Chest pain/pressure, [ ]  Shortness of breath at rest [ ]  Shortness of breath with exertion, [ ]  Atrial fibrillation or irregular heartbeat  Vascular: [ ]  Pain in legs with walking, [ ]  Pain in legs at rest, [ ]  Pain in legs at night,  [ ]  Non-healing ulcer, [ ]  Blood clot in vein/DVT,   Pulmonary: [ ]  Home oxygen, [ ]  Productive cough, [ ]  Coughing up blood, [ ]  Asthma,  [ ]  Wheezing [ ]  COPD Musculoskeletal:  [ ]  Arthritis, [ ]  Low back pain, [ ]  Joint pain Hematologic: [x ] Easy Bruising, [ ]  Anemia; [ ]  Hepatitis Gastrointestinal: [ ]  Blood in stool, [ ]  Gastroesophageal Reflux/heartburn, Urinary: [ ]  chronic Kidney disease, [ ]  on HD - [ ]  MWF or [ ]  TTHS, [ ]  Burning with urination, [ ]  Difficulty urinating Skin: [ ]  Rashes, [ ]  Wounds Psychological: [ ]  Anxiety, [ ]  Depression  Physical Examination Filed Vitals:   08/26/14 1803 08/26/14 1839  BP: 128/86 145/89  Pulse: 92 87  Temp: 98.6 F (37 C)   Resp: 16 21  SpO2: 98% 100%   There is no weight on file to calculate BMI.  General:  WDWN in NAD Gait: Normal HENT: WNL Eyes: Pupils equal Pulmonary: normal non-labored breathing , without Rales, rhonchi,  wheezing Cardiac: RRR, without  Murmurs, rubs or gallops; No carotid bruits Abdomen: soft, NT, no masses Skin: no rashes, ulcers noted;  no Gangrene , no cellulitis; no open wounds;   Vascular Exam/Pulses:Doppler signals biphasic Brachial and radial/ulner equal bilaterally.   Musculoskeletal: no muscle wasting or atrophy; no edema Other than the left superior arm s/p av fistula ligation.  The post-op incision is well healed from ligation and excision of the distal 1/2 of the fistula. Neurologic: A&O X 3; Appropriate Affect ;  SENSATION: normal; MOTOR FUNCTION: 5/5 Symmetric Speech is fluent/normal   Significant Diagnostic Studies: CBC Lab Results  Component Value Date   WBC 10.5 07/27/2014   HGB 8.6* 07/27/2014   HCT 25.7*  07/27/2014   MCV 87.1 07/27/2014   PLT 354 07/27/2014    BMET    Component Value Date/Time   NA 137 07/26/2014 0622   K 4.4 07/26/2014 0622   CL 103 07/26/2014 0622   CO2 26 07/26/2014 0622   GLUCOSE 109* 07/26/2014 0622   BUN 15 07/26/2014 0622   CREATININE 1.62* 07/26/2014 0622   CALCIUM 9.5 07/26/2014 0622   GFRNONAA 52* 07/26/2014 0622   GFRAA 60* 07/26/2014 0622   CrCl cannot be calculated (Patient has no serum creatinine result on file.).  COAG Lab Results  Component Value Date   INR 1.09 07/16/2014     Non-Invasive Vascular Imaging:  Left upper extremity venous duplex completed. preliminary   Preliminary report: Previous DVT in the left subclavian vein appears resolved. Superficial thrombosis of the origin of the cepalic vein off the subclavian vein. Large mass consisting of mixed echoes from the mid clavicle area to the upper arm. There is vascularization noted within this mass.   Incidental finding: Thrombus noted in the brachial artery in the distal upper arm. Thumpy waveforms proximal to thrombus. Radial and ulnar arteries appear normal with triphasic waveforms. There appears to be a large collateral off the brachal artery.  ASSESSMENT/PLAN:  s/p excision of L AVF - readmitted for cellulitis and DVT He was treated for DVT with heparin and is currently on Xarelto.   He reports no symptoms of ischemia in the left hand. He will keep his follow up appointment with Dr. Trula Slade in June 2016 and recommend that he continued Xarelto  The patient was seen in conjunction with Dr. Kellie Simmering today  [place], [place] 08/26/2014 6:58 PM agree with above assessment Patient has no symptoms of ischemia in the left hand Duplex of the vein was performed to rule out DVT and incidental finding was filling defect in the left brachial artery I have reviewed the duplex scan and there does appear to be a filling defect in the artery but I do not think this is acute On  physical exam patient has triphasic flow in radial and ulnar arteries at the wrist and a palpable radial pulse  Discussed the situation with Dr. Trula Slade who did his surgery 6 weeks ago and he agrees that no further evaluation is indicated this time unless patient develops any symptoms of ischemia  He will return as scheduled in June for follow-up with Dr. Trula Slade but if develops pain or numbness in the left hand were return to the emergency department

## 2014-08-26 NOTE — Assessment & Plan Note (Addendum)
With a history of DVT in the left arm, concerned that this is either worsening of DVT despite treatment or other mass. Discussed with vascular surgeon and feel that this could also be a nonfunctional fistula with aneurysm which would be benign. Patient has minimal pain in 2 out of the 3 masses but some pain with the most proximal mass. -Marked all 3 areas and ordered left upper extremity venous duplex along with left upper extremity limited ultrasound. -Follow-up in one week or sooner if worsening symptoms. -**Addendum: Received call from vascular lab tech Helene at 5:22pm that patient has a left brachial artery thrombus and vascular lab tech feels that left hand is colder than right with decreased arterial pulse. Prior subclavian vein thrombus appears resolved. Also some other nondescript mass with some vascularization. Awaiting official read. Asked her to send patient to ED after discussing with late attending Dr. Mingo Amber. Notified emergency room the patient is coming and asked them to please page vascular surgeon on-call [phone] once pt roomed. Notified vascular surgeon as well. Gave FYI to [facility] team that patient is a likely admission. Upon arrival to ED, will need CBC, metabolic panel, PT, PTT, INR.

## 2014-08-26 NOTE — ED Provider Notes (Signed)
CSN: [id number]     Arrival date & time 08/26/14  1745 History   First MD Initiated Contact with Patient 08/26/14 1814     Chief Complaint  Patient presents with  . Arm Pain     (Consider location/radiation/quality/duration/timing/severity/associated sxs/prior Treatment) HPI  39 year old male presents from ultrasound after having a left upper extremity ultrasound performed. This showed a thrombus in his brachial artery. About one month ago he had surgery on his left upper arm by vascular surgery to repair his AV fistula. He no longer is on dialysis and so there was a moderate work vision of this. This was done by Dr. Trula Slade. Following this he had a subclavian DVT and is now on Xarelto. Since then he has been doing okay with swelling in his left upper arm in various places this seems to be a little worse today. He was in the family medicine office for regular scheduled visit following his discharge and there was concern about the areas of swelling ascites sent for an ultrasound. Ultrasound showed the thrombus so he was sent here. Denies any worse pain than typical. Patient was told by the ultrasonographer his left hand was cold rhythm is) he states his hand seemed always be cold. Denies any weakness or numbness.  Past Medical History  Diagnosis Date  . Hypertension   . Chronic kidney disease     s/p transplant  . Seizures     treated by Dr. Melrose Nakayama  . ADD (attention deficit disorder)   . Tuberous sclerosis   . Immune deficiency disorder   . Headache     Migraines  . GERD (gastroesophageal reflux disease)    Past Surgical History  Procedure Laterality Date  . Kidney transplant    . Av fistula placement    . Revison of arteriovenous fistula Left 07/16/2014    Procedure: EXCISION ANEURYSMAL AREA OF LEFT ARTERIOVENOUS FISTULA;  Surgeon: Serafina Mitchell, MD;  Location: [facility] OR;  Service: Vascular;  Laterality: Left;   Family History  Problem Relation Age of Onset  . Cancer Other     History  Substance Use Topics  . Smoking status: Never Smoker   . Smokeless tobacco: Not on file  . Alcohol Use: No    Review of Systems  Constitutional: Negative for fever.  Respiratory: Negative for shortness of breath.   Cardiovascular: Negative for chest pain.  Musculoskeletal:       Left arm swelling  Skin: Negative for wound.  Neurological: Negative for seizures and weakness.  All other systems reviewed and are negative.     Allergies  Erythromycin; Sulfa antibiotics; and Sulfacetamide sodium  Home Medications   Prior to Admission medications   Medication Sig Start Date End Date Taking? Authorizing Provider  acetaminophen (TYLENOL) 325 MG tablet Take 650 mg by mouth every 6 (six) hours as needed for mild pain.    Historical Provider, MD  amLODipine (NORVASC) 5 MG tablet Take 5 mg by mouth daily.     Historical Provider, MD  brompheniramine-pseudoephedrine (DIMETAPP) 1-15 MG/5ML ELIX Take 5 mLs by mouth 2 (two) times daily as needed for allergies.    Historical Provider, MD  divalproex (DEPAKOTE) 250 MG EC tablet Take 500 mg by mouth every evening.     Historical Provider, MD  doxycycline (VIBRA-TABS) 100 MG tablet Take 1 tablet (100 mg total) by mouth 2 (two) times daily. 07/27/14   Aquilla Hacker, MD  gabapentin (NEURONTIN) 300 MG capsule Take 300 mg by mouth daily.  Historical Provider, MD  imipramine (TOFRANIL) 25 MG tablet Take 50 mg by mouth at bedtime.     Historical Provider, MD  omeprazole (PRILOSEC) 20 MG capsule Take 20 mg by mouth daily.      Historical Provider, MD  oxyCODONE (OXY IR/ROXICODONE) 5 MG immediate release tablet Take 1 tablet (5 mg total) by mouth every 6 (six) hours as needed for severe pain. 07/17/14   Ulyses Amor, PA-C  polyethylene glycol (MIRALAX / GLYCOLAX) packet Take 17 g by mouth 2 (two) times daily. 07/27/14   York Ram Melancon, MD  predniSONE (DELTASONE) 5 MG tablet Take 5 mg by mouth daily.     Historical Provider, MD   Rivaroxaban (XARELTO) 15 MG TABS tablet Take 1 tablet (15 mg total) by mouth 2 (two) times daily with a meal. 07/27/14 08/14/14  Katheren Shams, DO  rivaroxaban (XARELTO) 20 MG TABS tablet Take 1 tablet (20 mg total) by mouth daily with supper. 08/15/14   Katheren Shams, DO  tacrolimus (PROGRAF) 1 MG capsule Take 2 mg by mouth 2 (two) times daily.     Historical Provider, MD   BP 145/89 mmHg  Pulse 87  Temp(Src) 98.6 F (37 C)  Resp 21  SpO2 100% Physical Exam  Constitutional: He is oriented to person, place, and time. He appears well-developed and well-nourished.  HENT:  Head: Normocephalic and atraumatic.  Right Ear: External ear normal.  Left Ear: External ear normal.  Nose: Nose normal.  Eyes: Right eye exhibits no discharge. Left eye exhibits no discharge.  Neck: Neck supple.  Cardiovascular: Normal rate and intact distal pulses.   Pulses:      Radial pulses are 2+ on the right side, and 2+ on the left side.  2+ left brachial pulse  Pulmonary/Chest: Effort normal.  Abdominal: He exhibits no distension.  Musculoskeletal: He exhibits no edema.       Arms: Neurological: He is alert and oriented to person, place, and time.  Skin: Skin is warm and dry.  Nursing note and vitals reviewed.   ED Course  Procedures (including critical care time) Labs Review Labs Reviewed - No data to display  Imaging Review No results found.   EKG Interpretation None      MDM   Final diagnoses:  Brachial artery thrombus    Patient has normal distal pulses bilaterally. I do not appreciate a cooler sensation to the left hand. Ultrasound from earlier in the day reviewed with Dr. Kellie Simmering who also evaluated the patient. No indication for acute treatment or admission based on this finding. He appears to have good collateral flow and is neurovascular intact. Recommends discharge with follow-up to Dr. Trula Slade as scheduled.    Sherwood Gambler, MD 08/27/14 [phone]

## 2014-09-14 LAB — CBC AND DIFFERENTIAL
HCT: 34 % — AB (ref 41–53)
Hemoglobin: 11.7 g/dL — AB (ref 13.5–17.5)
Platelets: 281 10*3/uL (ref 150–399)
WBC: 8.7 10^3/mL

## 2014-09-14 LAB — BASIC METABOLIC PANEL
BUN: 20 mg/dL (ref 4–21)
CREATININE: 1.6 mg/dL — AB (ref 0.6–1.3)
Glucose: 90 mg/dL
Potassium: 4.6 mmol/L (ref 3.4–5.3)
Sodium: 141 mmol/L (ref 137–147)

## 2014-09-14 LAB — HEPATIC FUNCTION PANEL
ALK PHOS: 82 U/L (ref 25–125)
ALT: 15 U/L (ref 10–40)
AST: 15 U/L (ref 14–40)
Bilirubin, Total: 0.2 mg/dL

## 2014-09-15 LAB — TACROLIMUS LEVEL: Tacrolimus Lvl: 4.3 ng/ml — ABNORMAL LOW (ref 5–20)

## 2014-09-17 ENCOUNTER — Encounter: Payer: Self-pay | Admitting: Family Medicine

## 2014-09-29 ENCOUNTER — Other Ambulatory Visit: Payer: Self-pay | Admitting: Obstetrics and Gynecology

## 2014-10-27 ENCOUNTER — Encounter: Payer: Self-pay | Admitting: Surgery

## 2014-10-29 DIAGNOSIS — Z1159 Encounter for screening for other viral diseases: Secondary | ICD-10-CM | POA: Diagnosis not present

## 2014-10-29 DIAGNOSIS — D899 Disorder involving the immune mechanism, unspecified: Secondary | ICD-10-CM | POA: Diagnosis not present

## 2014-10-29 DIAGNOSIS — Z940 Kidney transplant status: Secondary | ICD-10-CM | POA: Diagnosis not present

## 2014-10-29 DIAGNOSIS — I10 Essential (primary) hypertension: Secondary | ICD-10-CM | POA: Diagnosis not present

## 2014-10-29 DIAGNOSIS — Z4822 Encounter for aftercare following kidney transplant: Secondary | ICD-10-CM | POA: Diagnosis not present

## 2014-10-29 DIAGNOSIS — N281 Cyst of kidney, acquired: Secondary | ICD-10-CM | POA: Diagnosis not present

## 2014-10-29 DIAGNOSIS — Z79899 Other long term (current) drug therapy: Secondary | ICD-10-CM | POA: Diagnosis not present

## 2014-11-02 ENCOUNTER — Other Ambulatory Visit: Payer: Self-pay | Admitting: Surgery

## 2014-11-02 ENCOUNTER — Ambulatory Visit (INDEPENDENT_AMBULATORY_CARE_PROVIDER_SITE_OTHER): Payer: Medicare Other | Admitting: Surgery

## 2014-11-02 ENCOUNTER — Ambulatory Visit (HOSPITAL_COMMUNITY)
Admission: RE | Admit: 2014-11-02 | Discharge: 2014-11-02 | Disposition: A | Discharge status: Home / Self Care | Payer: Medicare Other | Source: Ambulatory Visit | Attending: Surgery | Admitting: Surgery

## 2014-11-02 ENCOUNTER — Encounter: Payer: Self-pay | Admitting: Surgery

## 2014-11-02 VITALS — BP 138/94 | HR 86 | Temp 98.2°F | Resp 14 | Ht 72.0 in | Wt 138.0 lb

## 2014-11-02 DIAGNOSIS — I770 Arteriovenous fistula, acquired: Secondary | ICD-10-CM | POA: Diagnosis not present

## 2014-11-02 DIAGNOSIS — I82622 Acute embolism and thrombosis of deep veins of left upper extremity: Secondary | ICD-10-CM

## 2014-11-02 DIAGNOSIS — Z48812 Encounter for surgical aftercare following surgery on the circulatory system: Secondary | ICD-10-CM

## 2014-11-02 NOTE — Progress Notes (Signed)
Patient name: Tyler Alvarez MRN: [id number] DOB: 03/08/1976 Sex: male     Chief Complaint  Patient presents with  . Follow-up    Left arm AVF resection 07-16-14    HISTORY OF PRESENT ILLNESS: is back today for follow-up. 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 He has a history of renal transplant and is not currently using his fistula. It had become very aneurysmal. I resected approximately one half of the fistula. Unfortunately, he developed a severe thrombophlebitis from the residual fistula. This required admission to the hospital. He was also found to have extension of the thrombus into the subclavian vein. He was treated for DVT with heparin which was transitioned to [place]. He was also treated with antibiotics for possible cellulitis.  He has no complaints today.  The arm swelling and cellulitis have resolved.  He remains on anticoagulation  Past Medical History  Diagnosis Date  . Hypertension   . Chronic kidney disease     s/p transplant  . Seizures     treated by Dr. Melrose Nakayama  . ADD (attention deficit disorder)   . Tuberous sclerosis   . Immune deficiency disorder   . Headache     Migraines  . GERD (gastroesophageal reflux disease)     Past Surgical History  Procedure Laterality Date  . Kidney transplant    . Av fistula placement    . Revison of arteriovenous fistula Left 07/16/2014    Procedure: EXCISION ANEURYSMAL AREA OF LEFT ARTERIOVENOUS FISTULA;  Surgeon: Serafina Mitchell, MD;  Location: [facility];  Service: Vascular;  Laterality: Left;    History   Social History  . Marital Status: Single    Spouse Name: N/A  . Number of Children: N/A  . Years of Education: N/A   Occupational History  . Not on file.   Social History Main Topics  . Smoking status: Never Smoker   . Smokeless tobacco: Not on file  . Alcohol Use: No  . Drug Use: No  . Sexual Activity: Yes   Other Topics Concern  . Not on file    Social History Narrative    Family History  Problem Relation Age of Onset  . Cancer Other     Allergies as of 11/02/2014 - Review Complete 11/02/2014  Allergen Reaction Noted  . Erythromycin Nausea And Vomiting 11/08/2010  . Sulfa antibiotics Nausea And Vomiting 11/30/2011  . Sulfacetamide sodium  07/15/2014    Current Outpatient Prescriptions on File Prior to Visit  Medication Sig Dispense Refill  . acetaminophen (TYLENOL) 325 MG tablet Take 650 mg by mouth every 6 (six) hours as needed for mild pain.    Marland Kitchen amLODipine (NORVASC) 5 MG tablet Take 5 mg by mouth daily.     . brompheniramine-pseudoephedrine (DIMETAPP) 1-15 MG/5ML ELIX Take 5 mLs by mouth 2 (two) times daily as needed for allergies.    Marland Kitchen divalproex (DEPAKOTE) 250 MG EC tablet Take 500 mg by mouth every evening.     . gabapentin (NEURONTIN) 300 MG capsule Take 300 mg by mouth daily.     Marland Kitchen imipramine (TOFRANIL) 25 MG tablet Take 50 mg by mouth at bedtime.     Marland Kitchen omeprazole (PRILOSEC) 20 MG capsule Take 20 mg by mouth daily.      . predniSONE (DELTASONE) 5 MG tablet Take 5 mg by mouth daily.     . tacrolimus (PROGRAF) 1 MG capsule Take 2 mg by mouth 2 (two) times daily.     Marland Kitchen  XARELTO 20 MG TABS tablet TAKE 1 TABLET BY MOUTH EVERY DAY WITH SUPPER 65 tablet 1  . polyethylene glycol (MIRALAX / GLYCOLAX) packet Take 17 g by mouth 2 (two) times daily. (Patient not taking: Reported on 11/02/2014) 14 each 0   No current facility-administered medications on file prior to visit.     REVIEW OF SYSTEMS: Cardiovascular: No chest pain, chest pressure, palpitations, orthopnea, or dyspnea on exertion. No claudication or rest pain,  No history of DVT or phlebitis. Pulmonary: No productive cough, asthma or wheezing. Neurologic: No weakness, paresthesias, aphasia, or amaurosis. No dizziness. Hematologic: No bleeding problems or clotting disorders. Musculoskeletal: No joint pain or joint swelling. Gastrointestinal: No blood in stool or  hematemesis Genitourinary: No dysuria or hematuria. Psychiatric:: No history of major depression. Integumentary: No rashes or ulcers. Constitutional: No fever or chills.  PHYSICAL EXAMINATION:   Vital signs are  Filed Vitals:   11/02/14 1405  BP: 138/94  Pulse: 86  Temp: 98.2 F (36.8 C)  TempSrc: Oral  Resp: 14  Height: 6' (1.829 m)  Weight: 138 lb (62.596 kg)  SpO2: 99%   Body mass index is 18.71 kg/(m^2). General: The patient appears their stated age. HEENT:  No gross abnormalities Pulmonary:  Non labored breathing Musculoskeletal: There are no major deformities. Neurologic: No focal weakness or paresthesias are detected, Skin: There are no ulcer or rashes noted. Psychiatric: The patient has normal affect. Cardiovascular: Palpable brachial pulse no edema in the arm   Diagnostic Studies Ultrasound was performed today.  I have reviewed it.  There is a chronic nonoccluding deep vein thrombosis within 1 left brachial vein at the basilic vein confluence without extension into the basilic vein.  The remainder of the deep venous system of the left upper extremity is compressible.  The basilic vein is compressible  Assessment: Superficial thrombophlebitis, following resection of an aneurysmal left cephalic vein fistula Plan: The patient's arm has returned to baseline.  No significant DVT in the left upper extremity.  The patient has completed 3 month course of anticoagulation.  I'm recommending discontinuation today.  With follow-up as needed  V. Leia Alf, M.D. [facility] Office: [phone] Pager:  [phone]

## 2014-12-24 ENCOUNTER — Ambulatory Visit (INDEPENDENT_AMBULATORY_CARE_PROVIDER_SITE_OTHER): Payer: Medicare Other | Admitting: Family Medicine

## 2014-12-24 ENCOUNTER — Encounter: Payer: Self-pay | Admitting: Family Medicine

## 2014-12-24 VITALS — BP 145/86 | HR 89 | Temp 97.9°F | Ht 72.0 in | Wt 144.2 lb

## 2014-12-24 DIAGNOSIS — L0211 Cutaneous abscess of neck: Secondary | ICD-10-CM

## 2014-12-24 MED ORDER — CLINDAMYCIN HCL 300 MG PO CAPS: 300.0000 mg | ORAL_CAPSULE | Freq: Three times a day (TID) | ORAL | Status: DC

## 2014-12-24 NOTE — Assessment & Plan Note (Signed)
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 Cyst wall remains and so decompression is incomplete. Rx clindamycin for MRSA and anaerobic coverage pending culture results. Will need follow up tomorrow for reevaluation.

## 2014-12-24 NOTE — Progress Notes (Signed)
Subjective: Tyler Alvarez is a 39 y.o. male with tuberous sclerosis, s/p renal transplant on tacrolimus and prednisone, presenting for a neck cyst.   Accompanied by his mother, he reports growing mass on left neck with foul smelling drainage and increasing warmth for about 5 days. Cyst has been present for > 1 year and required I&D previously for infection. He denies fever and nuchal rigidity, stridor and dyspnea.   Objective: BP 145/86 mmHg  Pulse 89  Temp(Src) 97.9 F (36.6 C) (Oral)  Ht 6' (1.829 m)  Wt 144 lb 3.2 oz (65.409 kg)  BMI 19.55 kg/m2 Gen: Well 39 y.o. male in some pain but not distressed Neck: Large (~15cm x 10cm) elevated cyst with overlying erythema without tracking. Some purulent, foul-smelling discharge present.  Lymph: [place] cervical LNs on left, otherwise unremarkable.   PROCEDURE NOTE: INCISION AND DRAINAGE OF LEFT NECK ABSCESS  A timeout protocol was performed prior to initiating the procedure. The area was prepared and draped in the usual, sterile manner. The site was anesthetized with 1.5 ml of 1% lidocaine with epinephrine. A linear stab incision along the local skin lines was made and profuse purulent material expressed. Culture was obtained. The abcess was probed with hemostat and sequestered pockets were opened. Bleeding was minimal. Iodoform packing was placed and hemostasis was maintained.   Followup: The patient tolerated the procedure well without complications. Standard post-procedure care is explained and return precautions are given.  Assessment/Plan: Tyler Alvarez is a 39 y.o. male here for infected epidermal cyst.  See problem list for plan.

## 2014-12-24 NOTE — Patient Instructions (Signed)
Incision and Drainage Care After Refer to this sheet in the next few weeks. These instructions provide you with information on caring for yourself after your procedure. Your caregiver may also give you more specific instructions. Your treatment has been planned according to current medical practices, but problems sometimes occur. Call your caregiver if you have any problems or questions after your procedure. HOME CARE INSTRUCTIONS   If antibiotic medicine is given, take it as directed. Finish it even if you start to feel better.  Only take over-the-counter or prescription medicines for pain, discomfort, or fever as directed by your caregiver.  Keep all follow-up appointments as directed by your caregiver.  Change any bandages (dressings) as directed by your caregiver. Replace old dressings with clean dressings.  Wash your hands before and after caring for your wound. You will receive specific instructions for cleansing and caring for your wound.  SEEK MEDICAL CARE IF:   You have increased pain, swelling, or redness around the wound.  You have increased drainage, smell, or bleeding from the wound.  You have muscle aches, chills, or you feel generally sick.  You have a fever. MAKE SURE YOU:   Understand these instructions.  Will watch your condition.  Will get help right away if you are not doing well or get worse. Document Released: 07/24/2011 Document Reviewed: 07/24/2011 ExitCare Patient Information 2015 [employer]. This information is not intended to replace advice given to you by your health care provider. Make sure you discuss any questions you have with your health care provider.  

## 2014-12-25 ENCOUNTER — Encounter: Payer: Self-pay | Admitting: Obstetrics and Gynecology

## 2014-12-25 ENCOUNTER — Ambulatory Visit (INDEPENDENT_AMBULATORY_CARE_PROVIDER_SITE_OTHER): Payer: Medicare Other | Admitting: Obstetrics and Gynecology

## 2014-12-25 VITALS — BP 135/81 | HR 102 | Temp 98.2°F | Wt 143.0 lb

## 2014-12-25 DIAGNOSIS — L0211 Cutaneous abscess of neck: Secondary | ICD-10-CM

## 2014-12-25 NOTE — Patient Instructions (Signed)
-  Please return on Monday for recheck of dressing and abscess -may need to be drained again -Monitor for fevers and adverse reactions to antibiotic. -Call out after hours emergency line if you need to speak with someone or go to the ED  Follow-up appointment Monday

## 2014-12-25 NOTE — Progress Notes (Signed)
   Subjective:   Patient ID: Tyler Alvarez, male    DOB: 12/01/1975, 39 y.o.   MRN: [id number]  Patient presents for Same Day Appointment  CC: Follow-up  HPI: # Follow-up:  patient returns to clinic for follow-up after I and D of abscess neck (8/11)  Patient denies any fevers  Tolerating clindamycin without side effects  Does not appear to be worsening  Continues to drain and has saturated dressing  Review of Systems   See HPI for ROS.   Past medical history, surgical, family, and social history reviewed and updated in the EMR as appropriate.  Objective:  BP 135/81 mmHg  Pulse 102  Temp(Src) 98.2 F (36.8 C) (Oral)  Wt 143 lb (64.864 kg)  Physical Exam General: well-appearing, NAD Neck: Large elevated abscess with packing in place, dried blood and pus surrounding, overlying erythema without tracking. No excessive warmth   Lymph: adenopathy appreciated  Assessment & Plan:  Abscess of left neck - appears stable. Patient without worsening of symptoms and afebrile. However it appears that there is still a fair amount of puss still present.  -continue antibiotics -repacked wound -return precautions discussed and emergency care -handout given -Follow-up on Monday to have area reassessed -If not improving by Monday patient may need to go to hospital for further I&D of abscess   Luiz Blare, DO 12/25/2014, 3:24 PM PGY-2, [place]

## 2014-12-27 LAB — WOUND CULTURE: Gram Stain: NONE SEEN

## 2014-12-28 ENCOUNTER — Ambulatory Visit (INDEPENDENT_AMBULATORY_CARE_PROVIDER_SITE_OTHER): Payer: Medicare Other | Admitting: Family Medicine

## 2014-12-28 VITALS — BP 138/89 | HR 100 | Temp 98.8°F | Wt 143.6 lb

## 2014-12-28 DIAGNOSIS — L0211 Cutaneous abscess of neck: Secondary | ICD-10-CM

## 2014-12-28 NOTE — Progress Notes (Signed)
    Subjective   Tyler Alvarez is a 39 y.o. male that presents for a same day visit  1. Left neck abscess: Was seen last week for I&D in this office and had packing placed. He is taking clindamycin but has missed several doses (usually taking twice per day). No fevers, chills. He has had mild nausea with no emesis.   ROS Per HPI  Social History  Substance Use Topics  . Smoking status: Never Smoker   . Smokeless tobacco: Not on file  . Alcohol Use: No    Allergies  Allergen Reactions  . Erythromycin Nausea And Vomiting  . Sulfa Antibiotics Nausea And Vomiting    Other reaction(s): ITCHING  . Sulfacetamide Sodium     Objective   BP 138/89 mmHg  Pulse 100  Temp(Src) 98.8 F (37.1 C) (Oral)  Wt 143 lb 9.6 oz (65.137 kg)  General: Well appearing Skin: Large abscess on left side of neck with draining purulence and mild erythema. Packing removed. Dressing changed. Multiple areas of fluctuance and tenderness        Assessment and Plan   No orders of the defined types were placed in this encounter.    Please refer to problem based charting

## 2014-12-28 NOTE — Patient Instructions (Signed)
Thank you for coming to see me today. It was a pleasure. Today we talked about:   Neck abscess: please continue antibiotics. I am referring you to the general surgeon  Please make an appointment to see [place] in 1-2 weeks for follow-up if not seen by the general surgeon.  If you have any questions or concerns, please do not hesitate to call the office at [phone].  Sincerely,  Cordelia Poche, MD

## 2014-12-28 NOTE — Assessment & Plan Note (Signed)
Appears to be improved. Patient preferring surgery referral. Referral placed.

## 2015-01-04 ENCOUNTER — Ambulatory Visit: Payer: Self-pay | Admitting: Surgery

## 2015-01-04 DIAGNOSIS — L0211 Cutaneous abscess of neck: Secondary | ICD-10-CM | POA: Diagnosis not present

## 2015-01-04 NOTE — H&P (Signed)
History of Present Illness Tyler Alvarez. Tyler Savitt MD; 01/04/2015 11:21 AM) Patient words: cyst.  The patient is a 39 year old male who presents with a subcutaneous abscess. 39 yo male who is chronically immunosuppressed after renal transplant presents with a 2 week history of a left neck abscess. This was drained under local by the [facility] resident, but this area continues to drain and have tenderness. He comes to the office today to discuss surgical treatment. He denies any fever. Problem List/Past Medical (Tyler Eversole, LPN; 0/97/3532 99:24 AM) NECK ABSCESS (682.1  L02.11)  Other Problems (Tyler Eversole, LPN; 2/68/3419 62:22 AM) Gastroesophageal Reflux Disease Heart murmur High blood pressure Seizure Disorder Transfusion history  Past Surgical History (Tyler Eversole, LPN; 9/79/8921 19:41 AM) Nephrectomy Bilateral.  Diagnostic Studies History (Tyler Eversole, LPN; 7/40/8144 81:85 AM) Colonoscopy never  Allergies (Tyler Eversole, LPN; 6/31/4970 26:37 AM) ERYTHROMYCIN Sulfa Antibiotics Sulfacetamide Sodium (Acne) *DERMATOLOGICALS*  Medication History (Tyler Eversole, LPN; 8/58/8502 77:41 AM) AmLODIPine Besylate (2.5MG  Tablet, Oral) Active. Clindamycin HCl (300MG  Capsule, Oral) Active. Divalproex Sodium (250MG  Tablet DR, Oral) Active. Gabapentin (300MG  Capsule, Oral) Active. Imipramine HCl (25MG  Tablet, Oral) Active. Omeprazole (20MG  Capsule DR, Oral) Active. PredniSONE (5MG  Tablet, Oral) Active. Polyethylene Glycol 3350 (Oral) Active. Prograf (1MG  Capsule, Oral) Active. Xarelto (20MG  Tablet, Oral) Active. Dimetapp (1-15MG /5ML Elixir, Oral) Active. Tylenol (325MG  Tablet, Oral) Active. Medications Reconciled  Social History (Tyler Eversole, LPN; 2/87/8676 72:09 AM) Caffeine use Carbonated beverages. No alcohol use No drug use Tobacco use Never smoker.  Family History Tyler Borer, LPN; 4/70/9628 36:62 AM) Anesthetic complications  Father. Arthritis Mother. Bleeding disorder Father. Cervical Cancer Mother. Colon Polyps Mother. Hypertension Mother. Migraine Headache Mother. Respiratory Condition Father. Thyroid problems Mother.     Review of Systems (Tyler Eversole LPN; 9/47/6546 50:35 AM) General Not Present- Appetite Loss, Chills, Fatigue, Fever, Night Sweats, Weight Gain and Weight Loss. Skin Not Present- Change in Wart/Mole, Dryness, Hives, Jaundice, New Lesions, Non-Healing Wounds, Rash and Ulcer. HEENT Present- Wears glasses/contact lenses. Not Present- Earache, Hearing Loss, Hoarseness, Nose Bleed, Oral Ulcers, Ringing in the Ears, Seasonal Allergies, Sinus Pain, Sore Throat, Visual Disturbances and Yellow Eyes. Respiratory Not Present- Bloody sputum, Chronic Cough, Difficulty Breathing, Snoring and Wheezing. Breast Not Present- Breast Mass, Breast Pain, Nipple Discharge and Skin Changes. Cardiovascular Not Present- Chest Pain, Difficulty Breathing Lying Down, Leg Cramps, Palpitations, Rapid Heart Rate, Shortness of Breath and Swelling of Extremities. Gastrointestinal Not Present- Abdominal Pain, Bloating, Bloody Stool, Change in Bowel Habits, Chronic diarrhea, Constipation, Difficulty Swallowing, Excessive gas, Gets full quickly at meals, Hemorrhoids, Indigestion, Nausea, Rectal Pain and Vomiting. Male Genitourinary Not Present- Blood in Urine, Change in Urinary Stream, Frequency, Impotence, Nocturia, Painful Urination, Urgency and Urine Leakage. Neurological Not Present- Decreased Memory, Fainting, Headaches, Numbness, Seizures, Tingling, Tremor, Trouble walking and Weakness. Psychiatric Not Present- Anxiety, Bipolar, Change in Sleep Pattern, Depression, Fearful and Frequent crying. Endocrine Not Present- Cold Intolerance, Excessive Hunger, Hair Changes, Heat Intolerance, Hot flashes and New Diabetes. Hematology Not Present- Easy Bruising, Excessive bleeding, Gland problems, HIV and Persistent  Infections.  Vitals (Tyler Eversole LPN; 4/65/6812 75:17 AM) 01/04/2015 10:48 AM Weight: 144 lb Height: 72in Body Surface Area: 1.82 m Body Mass Index: 19.53 kg/m Temp.: 98.50F(Oral)  Pulse: 88 (Regular)  BP: 160/90 (Sitting, Left Arm, Standard)     Physical Exam Tyler Key K. Yasemin Rabon MD; 01/04/2015 11:21 AM)  The physical exam findings are as follows: Note:WDWN in NAD Left neck - skin excoriation and irritation; 2 cm subcutaneous mass with two small openings, draining  some purulent fluid and sebum.    Assessment & Plan Tyler Alvarez. Tyler Lowrey MD; 01/04/2015 11:22 AM)  NECK ABSCESS (682.1  L02.11)  Current Plans Schedule for Surgery - Incision and drainage of left neck abscess. The surgical procedure has been discussed with the patient. Potential risks, benefits, alternative treatments, and expected outcomes have been explained. All of the patient's questions at this time have been answered. The likelihood of reaching the patient's treatment goal is good. The patient understand the proposed surgical procedure and wishes to proceed. Note:This likely represents an infected sebaceous cyst, but it does not appear that it was adequately drained and evacuated. Recommend formal incision and drainage under general anesthesia. We will try to schedule this as soon as possible.   Tyler Alvarez. Georgette Dover, MD, [facility] Surgery  General/ Trauma Surgery  01/04/2015 11:23 AM

## 2015-01-07 ENCOUNTER — Other Ambulatory Visit: Payer: Self-pay | Admitting: Surgery

## 2015-01-07 DIAGNOSIS — L720 Epidermal cyst: Secondary | ICD-10-CM | POA: Diagnosis not present

## 2015-01-07 DIAGNOSIS — L723 Sebaceous cyst: Secondary | ICD-10-CM | POA: Diagnosis not present

## 2015-01-07 DIAGNOSIS — L0211 Cutaneous abscess of neck: Secondary | ICD-10-CM | POA: Diagnosis not present

## 2015-01-09 DIAGNOSIS — L0211 Cutaneous abscess of neck: Secondary | ICD-10-CM | POA: Diagnosis not present

## 2015-01-15 DIAGNOSIS — T8189XA Other complications of procedures, not elsewhere classified, initial encounter: Secondary | ICD-10-CM | POA: Diagnosis not present

## 2015-01-15 DIAGNOSIS — S199XXA Unspecified injury of neck, initial encounter: Secondary | ICD-10-CM | POA: Diagnosis not present

## 2015-01-21 DIAGNOSIS — N2581 Secondary hyperparathyroidism of renal origin: Secondary | ICD-10-CM | POA: Diagnosis not present

## 2015-01-21 DIAGNOSIS — Z940 Kidney transplant status: Secondary | ICD-10-CM | POA: Diagnosis not present

## 2015-01-29 DIAGNOSIS — I10 Essential (primary) hypertension: Secondary | ICD-10-CM | POA: Diagnosis not present

## 2015-01-29 DIAGNOSIS — N183 Chronic kidney disease, stage 3 (moderate): Secondary | ICD-10-CM | POA: Diagnosis not present

## 2015-01-29 DIAGNOSIS — Z940 Kidney transplant status: Secondary | ICD-10-CM | POA: Diagnosis not present

## 2015-01-29 DIAGNOSIS — D899 Disorder involving the immune mechanism, unspecified: Secondary | ICD-10-CM | POA: Diagnosis not present

## 2015-03-01 ENCOUNTER — Ambulatory Visit (INDEPENDENT_AMBULATORY_CARE_PROVIDER_SITE_OTHER): Payer: Medicare Other | Admitting: *Deleted

## 2015-03-01 DIAGNOSIS — Z23 Encounter for immunization: Secondary | ICD-10-CM

## 2015-03-15 DIAGNOSIS — H5213 Myopia, bilateral: Secondary | ICD-10-CM | POA: Diagnosis not present

## 2015-03-15 DIAGNOSIS — Q851 Tuberous sclerosis: Secondary | ICD-10-CM | POA: Diagnosis not present

## 2015-03-15 DIAGNOSIS — H43812 Vitreous degeneration, left eye: Secondary | ICD-10-CM | POA: Diagnosis not present

## 2015-03-25 DIAGNOSIS — N2581 Secondary hyperparathyroidism of renal origin: Secondary | ICD-10-CM | POA: Diagnosis not present

## 2015-03-25 DIAGNOSIS — Z940 Kidney transplant status: Secondary | ICD-10-CM | POA: Diagnosis not present

## 2015-04-25 DIAGNOSIS — D849 Immunodeficiency, unspecified: Secondary | ICD-10-CM | POA: Insufficient documentation

## 2015-05-31 DIAGNOSIS — I10 Essential (primary) hypertension: Secondary | ICD-10-CM | POA: Diagnosis not present

## 2015-05-31 DIAGNOSIS — N183 Chronic kidney disease, stage 3 (moderate): Secondary | ICD-10-CM | POA: Diagnosis not present

## 2015-05-31 DIAGNOSIS — D899 Disorder involving the immune mechanism, unspecified: Secondary | ICD-10-CM | POA: Diagnosis not present

## 2015-05-31 DIAGNOSIS — Z940 Kidney transplant status: Secondary | ICD-10-CM | POA: Diagnosis not present

## 2015-05-31 DIAGNOSIS — N2581 Secondary hyperparathyroidism of renal origin: Secondary | ICD-10-CM | POA: Diagnosis not present

## 2015-08-05 DIAGNOSIS — G2581 Restless legs syndrome: Secondary | ICD-10-CM | POA: Diagnosis not present

## 2015-08-05 DIAGNOSIS — Q851 Tuberous sclerosis: Secondary | ICD-10-CM | POA: Diagnosis not present

## 2015-08-05 DIAGNOSIS — Z7952 Long term (current) use of systemic steroids: Secondary | ICD-10-CM | POA: Diagnosis not present

## 2015-08-05 DIAGNOSIS — Z79899 Other long term (current) drug therapy: Secondary | ICD-10-CM | POA: Diagnosis not present

## 2015-08-05 DIAGNOSIS — Z791 Long term (current) use of non-steroidal anti-inflammatories (NSAID): Secondary | ICD-10-CM | POA: Diagnosis not present

## 2015-08-05 DIAGNOSIS — Z7982 Long term (current) use of aspirin: Secondary | ICD-10-CM | POA: Diagnosis not present

## 2015-08-05 DIAGNOSIS — R569 Unspecified convulsions: Secondary | ICD-10-CM | POA: Diagnosis not present

## 2015-08-24 ENCOUNTER — Ambulatory Visit (INDEPENDENT_AMBULATORY_CARE_PROVIDER_SITE_OTHER): Payer: Medicare Other | Admitting: *Deleted

## 2015-08-24 ENCOUNTER — Encounter: Payer: Self-pay | Admitting: *Deleted

## 2015-08-24 VITALS — BP 125/82 | HR 92 | Temp 97.8°F | Resp 18 | Ht 72.0 in | Wt 141.6 lb

## 2015-08-24 DIAGNOSIS — Z0000 Encounter for general adult medical examination without abnormal findings: Secondary | ICD-10-CM | POA: Diagnosis not present

## 2015-08-24 NOTE — Progress Notes (Signed)
Subjective:    Tyler Alvarez is a 40 y.o. male who presents for an initial Medicare assessment.   Cardiac risk factors: hypertension, male gender and sedentary lifestyle.  Depression Screen Q1: Over the past two weeks, have you felt down, depressed or hopeless? yes, several days Q2: Over the past two weeks, have you felt little interest or pleasure in doing things? no  Activities of Daily Living In your present state of health, do you have any difficulty performing the following activities?:  Preparing food and eating?: No Bathing yourself: No Getting dressed: No Using the toilet:No Moving around from place to place: No In the past year have you fallen or had a near fall?:No  Current exercise habits: The patient does not participate in regular exercise at present.  Dietary issues discussed: none  Hearing difficulties: No Safe in current home environment: Yes  The following portions of the patient's history were reviewed and updated as appropriate: Allergies, Medications, History, Care Team, Advanced Directive  Objective:  Blood pressure 125/82, pulse 92, temperature 97.8 F (36.6 C), temperature source Oral, resp. rate 18, height 6' (1.829 m), weight 141 lb 9.6 oz (64.229 kg), SpO2 97 %. Body mass index is 19.2 kg/(m^2).  Assessment:   TUG test: Passed with time of 8 seconds Mini-Cog: Passed with score of 4  Plan:   During the course of the visit the patient was educated and counseled about appropriate screening and preventive services including:   Influenza vaccine  Td vaccine  Colorectal cancer screening  Advanced directives: has NO advanced directive  - add't info requested. Referral to SW: no  Patient Instructions (the written plan) was given to the patient.     Subjective:   JASHAWN Alvarez is a 40 y.o. male who presents for an Initial Medicare Annual Wellness Visit.    Objective:    Today's Vitals   08/24/15 1337  BP: 125/82  Pulse: 92  Temp: 97.8  F (36.6 C)  TempSrc: Oral  Resp: 18  Height: 6' (1.829 m)  Weight: 141 lb 9.6 oz (64.229 kg)  SpO2: 97%   Body mass index is 19.2 kg/(m^2).  Current Medications (verified) Outpatient Encounter Prescriptions as of 08/24/2015  Medication Sig  . acetaminophen (TYLENOL) 325 MG tablet Take 650 mg by mouth every 6 (six) hours as needed for mild pain.  Marland Kitchen amLODipine (NORVASC) 5 MG tablet Take 5 mg by mouth daily.   . brompheniramine-pseudoephedrine (DIMETAPP) 1-15 MG/5ML ELIX Take 5 mLs by mouth 2 (two) times daily as needed for allergies.  Marland Kitchen divalproex (DEPAKOTE) 250 MG EC tablet Take 500 mg by mouth every evening.   . gabapentin (NEURONTIN) 300 MG capsule Take 300 mg by mouth daily.   Marland Kitchen imipramine (TOFRANIL) 25 MG tablet Take 50 mg by mouth at bedtime.   Marland Kitchen omeprazole (PRILOSEC) 20 MG capsule Take 20 mg by mouth daily.    . predniSONE (DELTASONE) 5 MG tablet Take 5 mg by mouth daily.   . tacrolimus (PROGRAF) 1 MG capsule Take 2 mg by mouth 2 (two) times daily.   . [DISCONTINUED] clindamycin (CLEOCIN) 300 MG capsule Take 1 capsule (300 mg total) by mouth 3 (three) times daily. (Patient not taking: Reported on 08/24/2015)  . [DISCONTINUED] polyethylene glycol (MIRALAX / GLYCOLAX) packet Take 17 g by mouth 2 (two) times daily. (Patient not taking: Reported on 11/02/2014)  . [DISCONTINUED] XARELTO 20 MG TABS tablet TAKE 1 TABLET BY MOUTH EVERY DAY WITH SUPPER (Patient not taking: Reported on 08/24/2015)  No facility-administered encounter medications on file as of 08/24/2015.    Allergies (verified) Erythromycin; Sulfa antibiotics; and Sulfacetamide sodium   History: Past Medical History  Diagnosis Date  . Hypertension   . Chronic kidney disease     s/p transplant  . Seizures ([place])     treated by Dr. Melrose Nakayama  . ADD (attention deficit disorder)   . 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 Immune deficiency disorder ([place])   . Headache     Migraines  . GERD (gastroesophageal reflux disease)    Past  Surgical History  Procedure Laterality Date  . Kidney transplant    . Av fistula placement    . Revison of arteriovenous fistula Left 07/16/2014    Procedure: EXCISION ANEURYSMAL AREA OF LEFT ARTERIOVENOUS FISTULA;  Surgeon: Serafina Mitchell, MD;  Location: [facility];  Service: Vascular;  Laterality: Left;   Family History  Problem Relation Age of Onset  . Cancer Other   . Hypothyroidism Mother   . Cancer Mother 74    cervical  . Hypertension Mother   . COPD Mother   . COPD Father 20    COPD  . Ulcers Father   . COPD Brother   . Heart disease Brother    Social History   Occupational History  . Not on file.   Social History Main Topics  . Smoking status: Never Smoker   . Smokeless tobacco: Not on file  . Alcohol Use: No  . Drug Use: No  . Sexual Activity: Yes   Tobacco: Patient has never smoked Activities of Daily Living In your present state of health, do you have any difficulty performing the following activities: 08/24/2015  Hearing? N  Vision? N  Difficulty concentrating or making decisions? Y  Walking or climbing stairs? N  Dressing or bathing? N  Doing errands, shopping? Y    Immunizations and Health Maintenance Immunization History  Administered Date(s) Administered  . Influenza Split 02/09/2011  . Influenza Whole 03/08/2007, 02/21/2008, 02/24/2009  . Influenza,inj,Quad PF,36+ Mos 02/06/2013, 02/17/2014, 03/01/2015  . Tdap 11/08/2010   There are no preventive care reminders to display for this patient.  Patient Care Team: Sela Hua, MD as PCP - General (Nurse Practitioner) Dwana Melena, MD as Attending Physician (Neurology) Rexene Agent, MD as Attending Physician (Nephrology) Claudine Mouton as Nurse Practitioner (Nephrology)  Assessment:   This is a routine wellness examination for Tyler Alvarez.  Hearing/Vision screen  Hearing Screening   125Hz  250Hz  500Hz  1000Hz  2000Hz  4000Hz  8000Hz   Right ear:   25 25 25 25    Left ear:   25 25 25 25       Dietary issues and exercise activities discussed: Current Exercise Habits: The patient does not participate in regular exercise at present, Exercise limited by: None identified  Goals    . Blood Pressure < 140/90    . Exercise 3x per week (30 min per time)      Depression Screen PHQ 2/9 Scores 08/24/2015 12/28/2014 12/25/2014 12/24/2014  PHQ - 2 Score 1 0 0 0    Fall Risk Fall Risk  08/24/2015 08/26/2014  Falls in the past year? No No    Cognitive Function: Mini-Cog: Passed Screening Tests: TUG Test: Passed in 8 seconds Health Maintenance  Topic Date Due  . INFLUENZA VACCINE  12/14/2015  . TETANUS/TDAP  11/07/2020  . HIV Screening  Completed        Plan:    During the course of the visit Jaun was  educated and counseled about the following appropriate screening and preventive services:   Vaccines to include Influenza,Td  Colorectal cancer screening  Patient Instructions (the written plan) were given to the patient.   Velora Heckler, RN   08/24/2015       Addendum: I have reviewed this visit and discussed with Howell Rucks, RN, BSN, and agree with her documentation.  Hyman Bible, MD PGY-1

## 2015-08-24 NOTE — Patient Instructions (Signed)
  Tyler Alvarez , Thank you for taking time to come for your Medicare Wellness Visit. I appreciate your ongoing commitment to your health goals. Please review the following plan we discussed and let me know if I can assist you in the future.   These are the goals we discussed: Goals    . Blood Pressure < 140/90    . Exercise 3x per week (30 min per time)       This is a list of the screening recommended for you and due dates:  Health Maintenance  Topic Date Due  . Flu Shot  12/14/2015  . Tetanus Vaccine  11/07/2020  . HIV Screening  Completed

## 2015-09-07 DIAGNOSIS — Z940 Kidney transplant status: Secondary | ICD-10-CM | POA: Diagnosis not present

## 2015-09-07 DIAGNOSIS — N2581 Secondary hyperparathyroidism of renal origin: Secondary | ICD-10-CM | POA: Diagnosis not present

## 2015-09-13 DIAGNOSIS — I10 Essential (primary) hypertension: Secondary | ICD-10-CM | POA: Diagnosis not present

## 2015-09-13 DIAGNOSIS — Z940 Kidney transplant status: Secondary | ICD-10-CM | POA: Diagnosis not present

## 2015-09-13 DIAGNOSIS — N2581 Secondary hyperparathyroidism of renal origin: Secondary | ICD-10-CM | POA: Diagnosis not present

## 2015-09-13 DIAGNOSIS — N183 Chronic kidney disease, stage 3 (moderate): Secondary | ICD-10-CM | POA: Diagnosis not present

## 2015-09-14 ENCOUNTER — Encounter: Payer: Self-pay | Admitting: *Deleted

## 2015-09-15 NOTE — Addendum Note (Signed)
Addended by: Rockie Neighbours on: 09/15/2015 03:13 PM   Modules accepted: Level of Service

## 2015-10-18 DIAGNOSIS — Z940 Kidney transplant status: Secondary | ICD-10-CM | POA: Diagnosis not present

## 2015-11-15 ENCOUNTER — Ambulatory Visit (INDEPENDENT_AMBULATORY_CARE_PROVIDER_SITE_OTHER): Payer: Medicare Other | Admitting: Internal Medicine

## 2015-11-15 VITALS — BP 138/75 | HR 109 | Temp 98.5°F | Wt 143.0 lb

## 2015-11-15 DIAGNOSIS — I10 Essential (primary) hypertension: Secondary | ICD-10-CM

## 2015-11-15 DIAGNOSIS — Q851 Tuberous sclerosis: Secondary | ICD-10-CM | POA: Diagnosis not present

## 2015-11-15 DIAGNOSIS — M25571 Pain in right ankle and joints of right foot: Secondary | ICD-10-CM | POA: Diagnosis not present

## 2015-11-15 MED ORDER — DIVALPROEX SODIUM 250 MG PO DR TAB: 250.0000 mg | DELAYED_RELEASE_TABLET | Freq: Two times a day (BID) | ORAL | Status: DC

## 2015-11-15 NOTE — Assessment & Plan Note (Signed)
Stable. No longer following with Neurology, as he has been stable for a long time. - Refill Depakote 250mg  bid

## 2015-11-15 NOTE — Assessment & Plan Note (Signed)
Stable. BP 147/93, recheck 138/75. Currently at goal of <140/<90. Likely mildly elevated 2/2 drinking an energy drink before coming to appointment. Tolerating Norvasc without any side effects. - Continue Norvasc 5mg  daily - Follow-up in 6 months.

## 2015-11-15 NOTE — Assessment & Plan Note (Signed)
Likely a sprain, given that he has mild tenderness over the tendon of the tibialis anterior. Per [place] ankle rules, no need for imaging at this time, as Pt does not have tenderness over the posterior edge or tip of the lateral and medial malleolus. Also no tenderness at the base of the 5th metatarsal. Pt is able to walk without a limp. No concern for gout, as painful area is not warm or red. - Encouraged rest, ice, elevation - Tylenol prn for pain - Avoid NSAIDs with history of renal transplant - Return precautions given - Follow-up in 2 months if pain does not improve.

## 2015-11-15 NOTE — Patient Instructions (Addendum)
It was so nice to meet you!  I think you sprained your ankle. I would recommend resting for the next 1-2 weeks (only doing light activity), putting ice on the foot three times a day, elevating the foot as much as possible during the day, and using Tylenol as needed for pain. Please avoid any medications such as Ibuprofen. If your ankle is not better in 2 weeks, please give [place] a call.  Your blood pressure was a little bit high today. I think it is probably elevated because you drank an energy drink before you came to the office. You should continue to take Norvasc 5mg  daily. We will continue to keep an eye on your blood pressure.  We will see you back in 6 months for a follow-up appointment, or earlier if needed.  -Dr. Brett Albino

## 2015-11-15 NOTE — Progress Notes (Signed)
[facility] Phone: [phone]  Subjective:  Right foot pain: Has been hurting him for 1 week. Doesn't remember hurting his foot. It hurts at the top of his foot where his ankle meets his foot. He couldn't walk on it for a couple of days, but can walk on it now. The pain is worse with flexion and extension at the ankle. He feels "pressure" with bearing weight. The pain is better with elevating, using Tylenol, and soaking the foot in epsom salt bath. He feels like the right foot is more swollen than the left. The pain has overall gotten better over the last week. No redness, no bruising. At times, the whole foot feels warm. No other joints have been hurting or feeling warm. He injured the foot in 2013 when he dropped a lamp on it and had to wear a boot.  Hypertension: BPs have been running 130s/70s. Tolerating the Norvasc without any difficulty. Takes Norvasc daily. He thinks his blood pressure is elevated today because he drank an energy drink. No chest pain, no lower extremity edema.  Tuberous Sclerosis: Pt states his tuberous sclerosis has been stable. He was told that he no longer has to follow-up at his Neurologist's office and that his PCP can refill his Depakote from now on.  ROS: See HPI for pertinent positives and negatives Past Medical History- HTN, tuberous sclerosis, hx renal transplant Reviewed problem list.  Medications- reviewed and updated Current Outpatient Prescriptions  Medication Sig Dispense Refill  . acetaminophen (TYLENOL) 325 MG tablet Take 650 mg by mouth every 6 (six) hours as needed for mild pain.    Marland Kitchen amLODipine (NORVASC) 5 MG tablet Take 5 mg by mouth daily.     . brompheniramine-pseudoephedrine (DIMETAPP) 1-15 MG/5ML ELIX Take 5 mLs by mouth 2 (two) times daily as needed for allergies.    Marland Kitchen divalproex (DEPAKOTE) 250 MG DR tablet Take 1 tablet (250 mg total) by mouth 2 (two) times daily. 60 tablet 2  . gabapentin (NEURONTIN) 300 MG capsule  Take 300 mg by mouth daily.     Marland Kitchen imipramine (TOFRANIL) 25 MG tablet Take 50 mg by mouth at bedtime.     Marland Kitchen omeprazole (PRILOSEC) 20 MG capsule Take 20 mg by mouth daily.      . predniSONE (DELTASONE) 5 MG tablet Take 5 mg by mouth daily.     . tacrolimus (PROGRAF) 1 MG capsule Take 2 mg by mouth 2 (two) times daily.      No current facility-administered medications for this visit.   Chief complaint-noted Family history reviewed for today's visit. No changes. Social history- patient is a never smoker.  Objective: BP 138/75 mmHg  Pulse 109  Temp(Src) 98.5 F (36.9 C) (Oral)  Wt 143 lb (64.864 kg)  SpO2 95% Gen: NAD, alert, cooperative with exam HEENT: NCAT, EOMI, MMM Neck: FROM, supple CV: RRR, no murmur Resp: CTABL, no wheezes, normal work of breathing GI: SNTND, BS present, no guarding or organomegaly Msk: No edema, warm, normal tone, moves UE/LE spontaneously; mild tenderness over the tendon of the tibialis anterior on the right side, mild tenderness with dorsiflexion at the ankle, no erythema, no swelling; no tenderness over the posterior edge or tip of the lateral or medial malleolus, no tenderness at the base of the 5th metatarsal, able to walk without a limp. Neuro: Alert and oriented, no gross deficits Skin: No rashes, no lesions Psych: Appropriate behavior  Assessment/Plan: Right ankle/foot pain: Likely a sprain, given that he  has mild tenderness over the tendon of the tibialis anterior. Per [place] ankle rules, no need for imaging at this time, as Pt does not have tenderness over the posterior edge or tip of the lateral and medial malleolus. Also no tenderness at the base of the 5th metatarsal. Pt is able to walk without a limp. No concern for gout, as painful area is not warm or red. - Encouraged rest, ice, elevation - Tylenol prn for pain - Avoid NSAIDs with history of renal transplant - Return precautions given - Follow-up in 2 months if pain does not  improve.  Hypertension: Stable. BP 147/93, recheck 138/75. Currently at goal of <140/<90. Likely mildly elevated 2/2 drinking an energy drink before coming to appointment. Tolerating Norvasc without any side effects. - Continue Norvasc 5mg  daily - Follow-up in 6 months.  Tuberous Sclerosis: Stable. No longer following with Neurology, as he has been stable for a long time. - Refill Depakote 250mg  bid   Hyman Bible, MD PGY-1

## 2016-01-19 ENCOUNTER — Encounter: Payer: Self-pay | Admitting: Internal Medicine

## 2016-01-27 DIAGNOSIS — Z940 Kidney transplant status: Secondary | ICD-10-CM | POA: Diagnosis not present

## 2016-01-27 DIAGNOSIS — R7309 Other abnormal glucose: Secondary | ICD-10-CM | POA: Diagnosis not present

## 2016-02-01 DIAGNOSIS — D899 Disorder involving the immune mechanism, unspecified: Secondary | ICD-10-CM | POA: Diagnosis not present

## 2016-02-01 DIAGNOSIS — I10 Essential (primary) hypertension: Secondary | ICD-10-CM | POA: Diagnosis not present

## 2016-02-01 DIAGNOSIS — Z940 Kidney transplant status: Secondary | ICD-10-CM | POA: Diagnosis not present

## 2016-02-01 DIAGNOSIS — N2581 Secondary hyperparathyroidism of renal origin: Secondary | ICD-10-CM | POA: Diagnosis not present

## 2016-02-09 ENCOUNTER — Ambulatory Visit (INDEPENDENT_AMBULATORY_CARE_PROVIDER_SITE_OTHER): Payer: Medicare Other | Admitting: *Deleted

## 2016-02-09 DIAGNOSIS — Z23 Encounter for immunization: Secondary | ICD-10-CM

## 2016-02-23 ENCOUNTER — Other Ambulatory Visit: Payer: Self-pay | Admitting: Internal Medicine

## 2016-02-23 DIAGNOSIS — Q851 Tuberous sclerosis: Secondary | ICD-10-CM

## 2016-03-16 NOTE — Progress Notes (Signed)
   [facility] Phone: [phone]   Date of Visit: 03/17/2016   HPI:  Tyler Alvarez is a 40 y.o. male presenting to clinic today for same day appointment. PCP: Evette Doffing, MD Concerns today include: Present with mother:  Cough:  - started few nights ago, about 5 days - dry cough with possible post-nasal drip  - cold and cough medication helped clear it up - no shortness of breath  - no fevers - no sick contacts to his knowledge - symptoms have essentially resolved now   L Foot Pain: - about 1 week - he does not remember hurting his foot. May have hit his foot without realizing  - has been mildly red ever since he hurt it - he has noticed some swelling - no fever/chills - hurts to put pressure on it  - redness seems today but says it is possibly due his covered shoes; redness is not spreading  - unable to tell me if pain is improving or worsening.  - has been using Tylenol and Aspirin PRN for pain  ROS: See HPI.  [place]:  PMH: Aneurysm HTN S/p Renal Transplant Normocytic Anemia   PHYSICAL EXAM: BP 135/87   Pulse 97   Temp 98.3 F (36.8 C) (Oral)   Wt 145 lb (65.8 kg)   SpO2 100%   BMI 19.67 kg/m  GEN: NAD, non-toxic HEENT: oropharynx only with very mild erythema, no tonsillar exudate   CV: RRR, no murmurs, rubs, or gallops PULM: CTAB, normal effort MSK: Left Foot: mild swelling on the dorsal part of foot compared to the right, with mild erythema without clear borders on the dorsum of the foot. Tenderness to palpation on the dorsum of the foot and the second toe. Normal ROM of ankle (dorsiflexion, plana but reports of some discomfort with this. Decreased flexion of the toes due to pain of the second toe Reports sensation to light touch is different over the swollen area compared to the Right. Normal capillary refill. DP pulse intact.  NEURO: Awake, alert, no focal deficits grossly, normal speech  ASSESSMENT/PLAN:  Foot Pain:  Unclear inciting event/trauma from patient's history. Due to point tenderness and pain with weight-bearing, will obtain x-ray to further evaluate. Erythema is not likely due to infection.  - Tylenol PRN for pain; would avoid NSAID due to renal transplant - use ice PRN  - x-ray L foot   Smiley Houseman, MD PGY [place]

## 2016-03-17 ENCOUNTER — Ambulatory Visit (HOSPITAL_COMMUNITY)
Admission: RE | Admit: 2016-03-17 | Discharge: 2016-03-17 | Disposition: A | Discharge status: Home / Self Care | Payer: Medicare Other | Source: Ambulatory Visit | Attending: Family Medicine | Admitting: Family Medicine

## 2016-03-17 ENCOUNTER — Encounter: Payer: Self-pay | Admitting: Internal Medicine

## 2016-03-17 ENCOUNTER — Ambulatory Visit (INDEPENDENT_AMBULATORY_CARE_PROVIDER_SITE_OTHER): Payer: Medicare Other | Admitting: Internal Medicine

## 2016-03-17 VITALS — BP 135/87 | HR 97 | Temp 98.3°F | Wt 145.0 lb

## 2016-03-17 DIAGNOSIS — M25572 Pain in left ankle and joints of left foot: Secondary | ICD-10-CM

## 2016-03-17 DIAGNOSIS — M7989 Other specified soft tissue disorders: Secondary | ICD-10-CM | POA: Diagnosis not present

## 2016-03-17 NOTE — Patient Instructions (Signed)
We will get an x-ray of your foot to further evaluate your pain. I will call you when I get the results.  Try ice on your foot for your symptoms

## 2016-03-20 ENCOUNTER — Telehealth: Payer: Self-pay | Admitting: Internal Medicine

## 2016-03-20 NOTE — Telephone Encounter (Signed)
Called mother to inform that x-ray did not show any signs of fracture. Asked to return to clinic if symptoms are not improving or sooner if symptoms are worsening to be re-evaluated.

## 2016-04-09 IMAGING — CR DG CHEST 2V
2 series · 2 of 2 positions shown · non-contrast
Comparison: 12/01/2011

CLINICAL DATA: Cough, congestion, nausea, weakness

EXAM:
CHEST  2 VIEW

[w chest lat]
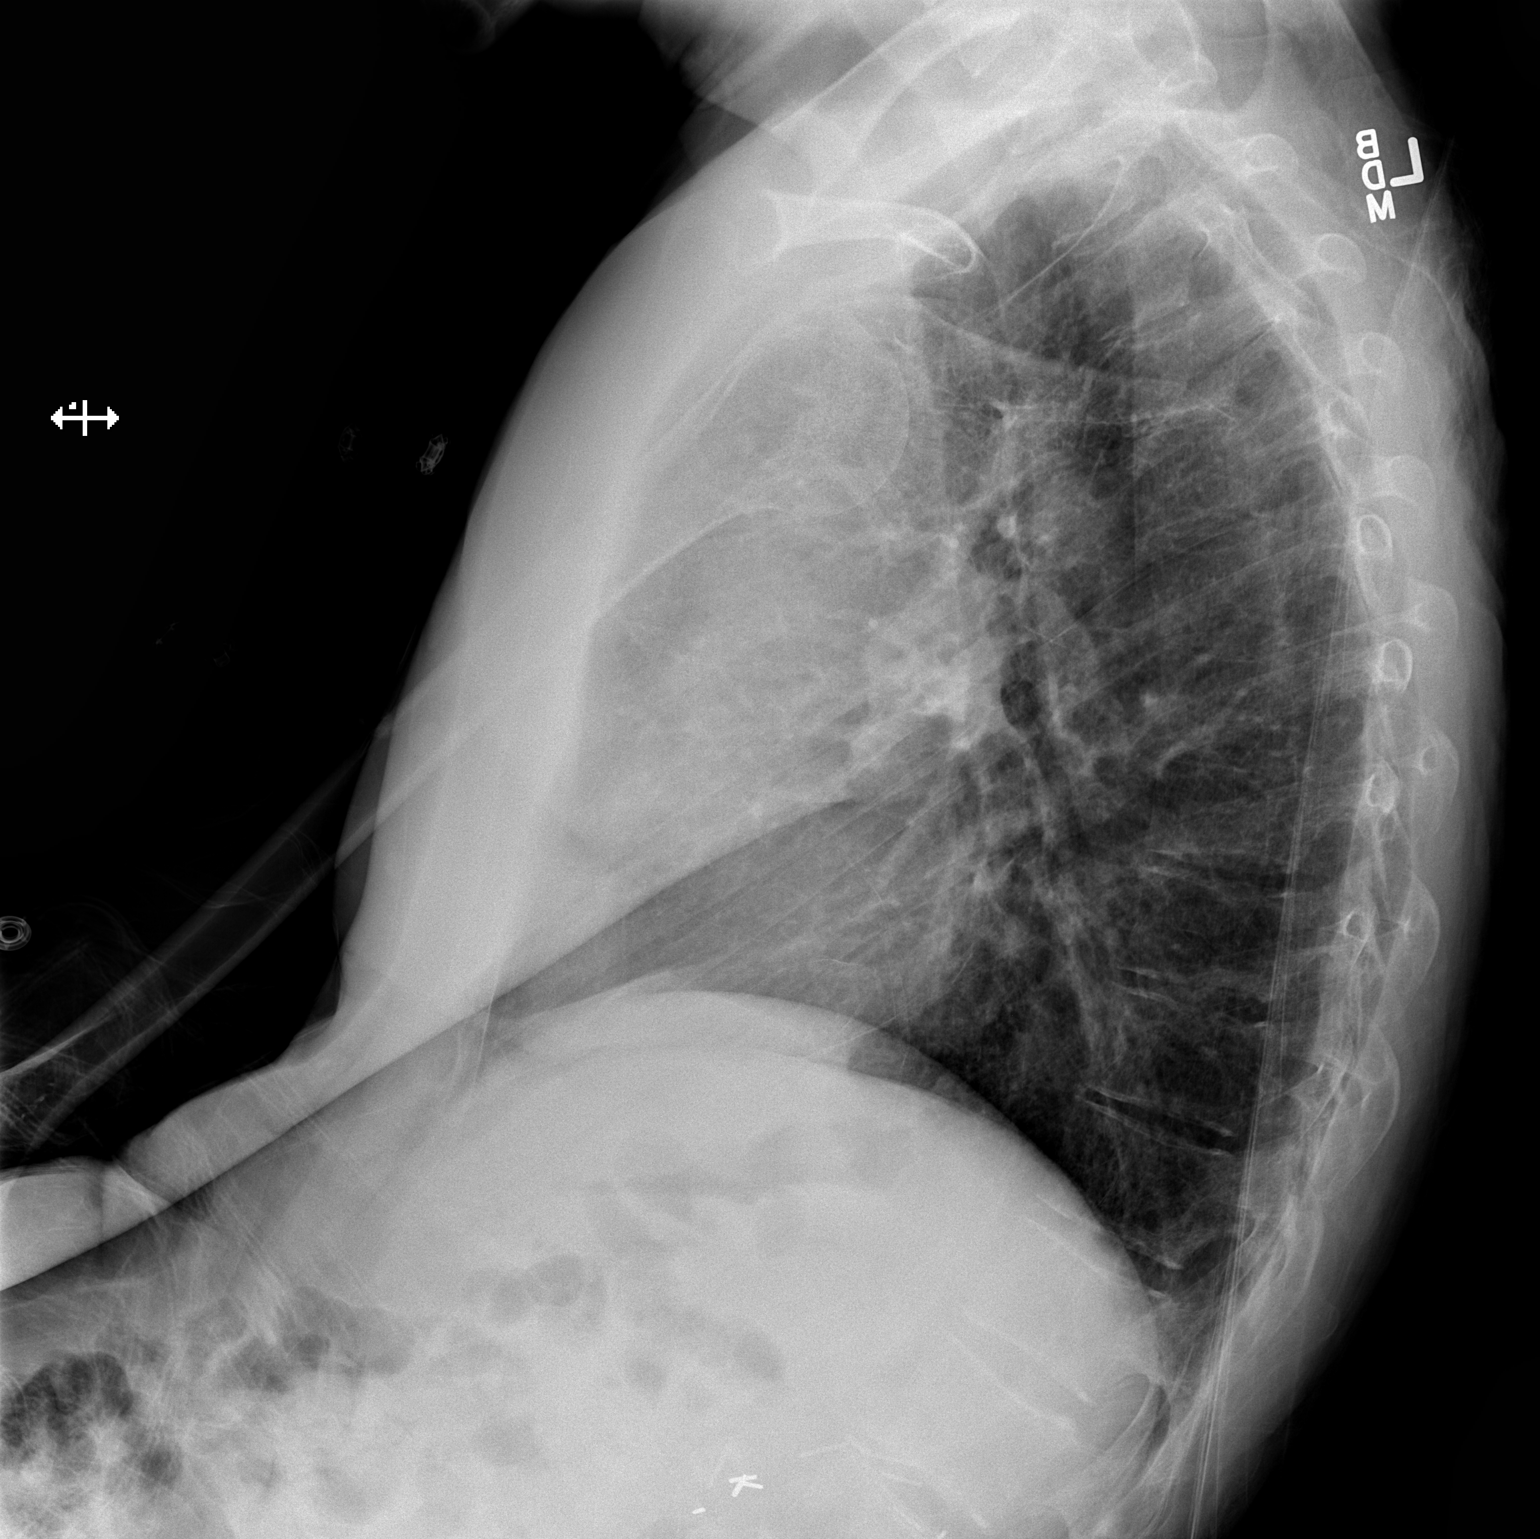

[x chest ap]
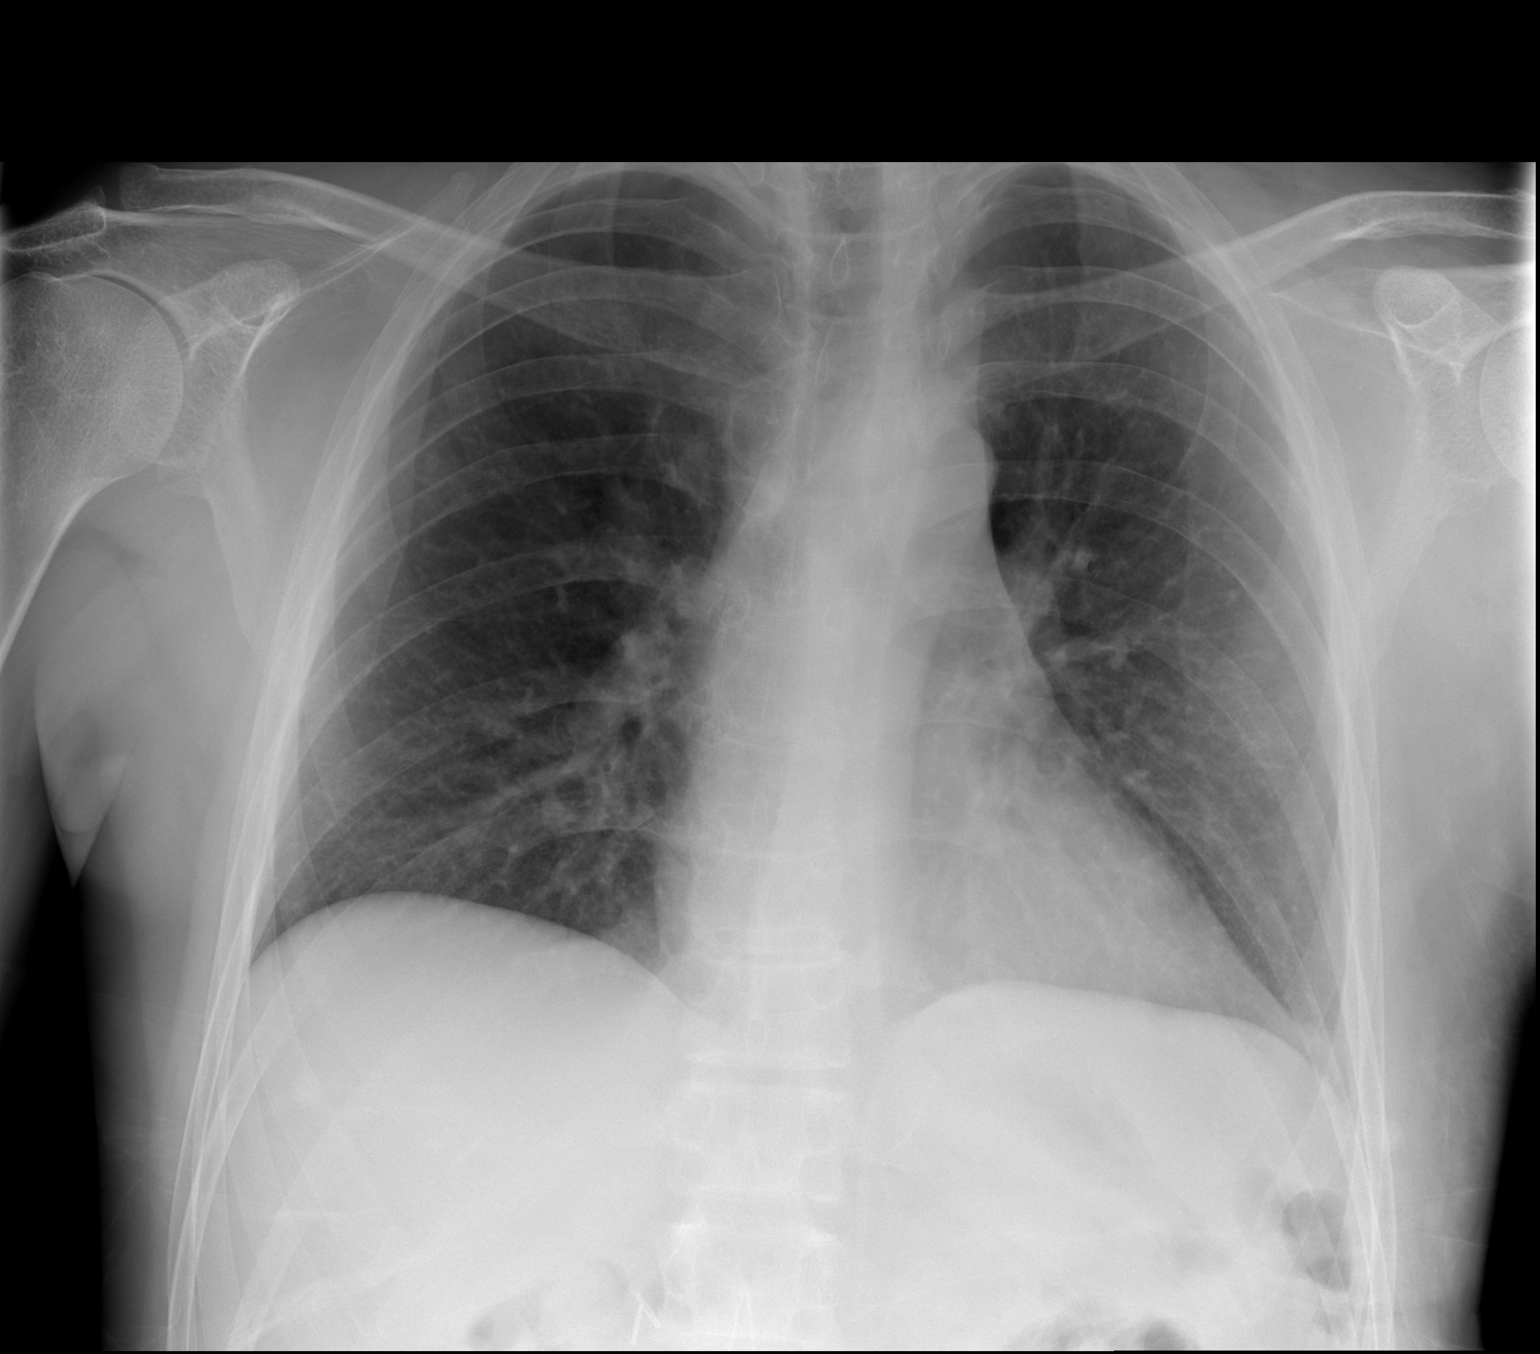

[2 of 2 positions shown; findings below may reference images not displayed]

FINDINGS: Cardiomediastinal silhouette is stable. No acute infiltrate or
pleural effusion. No pulmonary edema. Stable chronic mild
interstitial prominence.
IMPRESSION: No active cardiopulmonary disease.

## 2016-05-03 DIAGNOSIS — Z940 Kidney transplant status: Secondary | ICD-10-CM | POA: Diagnosis not present

## 2016-05-03 DIAGNOSIS — D899 Disorder involving the immune mechanism, unspecified: Secondary | ICD-10-CM | POA: Diagnosis not present

## 2016-05-03 DIAGNOSIS — Z79899 Other long term (current) drug therapy: Secondary | ICD-10-CM | POA: Diagnosis not present

## 2016-06-01 ENCOUNTER — Other Ambulatory Visit: Payer: Self-pay | Admitting: Internal Medicine

## 2016-06-01 DIAGNOSIS — Q851 Tuberous sclerosis: Secondary | ICD-10-CM

## 2016-06-20 DIAGNOSIS — E559 Vitamin D deficiency, unspecified: Secondary | ICD-10-CM | POA: Diagnosis not present

## 2016-06-20 DIAGNOSIS — D899 Disorder involving the immune mechanism, unspecified: Secondary | ICD-10-CM | POA: Diagnosis not present

## 2016-06-20 DIAGNOSIS — N2581 Secondary hyperparathyroidism of renal origin: Secondary | ICD-10-CM | POA: Diagnosis not present

## 2016-06-20 DIAGNOSIS — Z940 Kidney transplant status: Secondary | ICD-10-CM | POA: Diagnosis not present

## 2016-06-20 DIAGNOSIS — I10 Essential (primary) hypertension: Secondary | ICD-10-CM | POA: Diagnosis not present

## 2016-09-06 DIAGNOSIS — Z940 Kidney transplant status: Secondary | ICD-10-CM | POA: Diagnosis not present

## 2016-09-06 DIAGNOSIS — N2581 Secondary hyperparathyroidism of renal origin: Secondary | ICD-10-CM | POA: Diagnosis not present

## 2016-09-16 ENCOUNTER — Other Ambulatory Visit: Payer: Self-pay | Admitting: Internal Medicine

## 2016-09-16 DIAGNOSIS — Q851 Tuberous sclerosis: Secondary | ICD-10-CM

## 2016-09-18 DIAGNOSIS — E559 Vitamin D deficiency, unspecified: Secondary | ICD-10-CM | POA: Diagnosis not present

## 2016-09-18 DIAGNOSIS — N2581 Secondary hyperparathyroidism of renal origin: Secondary | ICD-10-CM | POA: Diagnosis not present

## 2016-09-18 DIAGNOSIS — D899 Disorder involving the immune mechanism, unspecified: Secondary | ICD-10-CM | POA: Diagnosis not present

## 2016-09-18 DIAGNOSIS — I10 Essential (primary) hypertension: Secondary | ICD-10-CM | POA: Diagnosis not present

## 2016-09-18 DIAGNOSIS — Z940 Kidney transplant status: Secondary | ICD-10-CM | POA: Diagnosis not present

## 2016-09-28 ENCOUNTER — Other Ambulatory Visit: Payer: Self-pay | Admitting: Internal Medicine

## 2016-10-27 DIAGNOSIS — L720 Epidermal cyst: Secondary | ICD-10-CM | POA: Diagnosis not present

## 2016-10-27 DIAGNOSIS — L738 Other specified follicular disorders: Secondary | ICD-10-CM | POA: Diagnosis not present

## 2016-10-27 DIAGNOSIS — D225 Melanocytic nevi of trunk: Secondary | ICD-10-CM | POA: Diagnosis not present

## 2016-11-09 DIAGNOSIS — Z940 Kidney transplant status: Secondary | ICD-10-CM | POA: Diagnosis not present

## 2016-11-09 DIAGNOSIS — Z7689 Persons encountering health services in other specified circumstances: Secondary | ICD-10-CM | POA: Diagnosis not present

## 2016-11-16 ENCOUNTER — Other Ambulatory Visit: Payer: Self-pay | Admitting: *Deleted

## 2016-11-16 DIAGNOSIS — Q851 Tuberous sclerosis: Secondary | ICD-10-CM

## 2016-11-16 MED ORDER — DIVALPROEX SODIUM 250 MG PO DR TAB: 250.0000 mg | DELAYED_RELEASE_TABLET | Freq: Two times a day (BID) | ORAL | 2 refills | Status: DC

## 2016-11-19 DIAGNOSIS — L723 Sebaceous cyst: Secondary | ICD-10-CM | POA: Diagnosis not present

## 2016-11-19 DIAGNOSIS — L0211 Cutaneous abscess of neck: Secondary | ICD-10-CM | POA: Diagnosis not present

## 2016-11-24 DIAGNOSIS — Z940 Kidney transplant status: Secondary | ICD-10-CM | POA: Diagnosis not present

## 2016-11-30 DIAGNOSIS — Q851 Tuberous sclerosis: Secondary | ICD-10-CM | POA: Diagnosis not present

## 2016-11-30 DIAGNOSIS — L720 Epidermal cyst: Secondary | ICD-10-CM | POA: Diagnosis not present

## 2016-12-04 NOTE — H&P (Signed)
Subjective:     Patient ID: Tyler Alvarez is a 41 y.o. male.  HPI  Referred by Dr. Elvera Lennox for evaluation mass neck present for at least a year. Has multiple cysts in area and at least one prior abscess in this area, underwent I&D Dr. Georgette Dover 2016 and wound left open to heal secondarily. Approximately year ago onset new swelling inferior to prior scar. Since time of referral this grew to size that is spontaneously drained from prior scar. Seen in UC and they describe additional fluid expressed and completed Keflex course this week.  PMH significant for tuberous sclerosis. 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 Second kidney prophylactically removed and patient was dialysis dependent for period prior to renal transplant donated by brother, completed at [facility]. On prednisone and prograf, followed by Dr. Joelyn Oms [facility].   Has known SEM. Per patient had this for years, may be related to "cyst" heart muscle. Reports ECHO prior to transplant completed and no intervention recommended.  Review of Systems  Skin: Positive for wound.  Remainder 12 point review negative     Objective:   Physical Exam  Constitutional: He is oriented to person, place, and time.  Cardiovascular: Normal rate.   Murmur heard. Pulmonary/Chest: Effort normal and breath sounds normal.  Neurological: He is alert and oriented to person, place, and time.  Psychiatric: He has a normal mood and affect.   HEENT: left neck with multiple nodular and cystic areas, area of fullness 3 x 5 cm with overlying punctum and healed transverse scar superior to it, no cellulitis or current drainage     Assessment:     cyst neck, Tuberous sclerosis Hx renal transplant on immunosuppression, steroids    Plan:     Plan excision of cyst in OR. Counseled if recurrent increased swelling or erythema to call and will start additional antibiotics. Will  address area of concern only, there are multiple cysts in area. Counseled if inflamed or abscessed may need to leave open as he has had in past. Reviewed his medications place him at greater risk wound healing problems and infection. Additional risks anesthesia, recurrence damage to adjacent structures reviewed. Asked him to trim beard prior to surgery.    Irene Limbo, MD [facility] Plastic & Reconstructive Surgery [phone], pin [phone]

## 2016-12-07 ENCOUNTER — Encounter (HOSPITAL_COMMUNITY): Payer: Self-pay | Admitting: *Deleted

## 2016-12-07 ENCOUNTER — Other Ambulatory Visit (HOSPITAL_COMMUNITY): Payer: Medicare Other

## 2016-12-07 NOTE — Progress Notes (Signed)
Pt SDW-Pre op call completed by pt mother, Wilburn Cornelia (see pt comments). Mother denies pt C/O SOB and chest pain. Mother denies that pt is under the care of a cardiologist. Mother denies that pt had a stress test and cardiac cath. Mother denies that pt had an EKG within the last year. According to Katharine Look, Medical Staff, MD advised that pt can continue taking Aspirin pre-operatively. Mother made aware to have pt stop taking vitamins, fish oil, Dimetapp and herbal medications. Do not take any NSAIDs ie: Ibuprofen, Advil, Naproxen (Aleve), Motrin, BC and Goody Powder. Mother verbalized understanding of all pre-op instructions.

## 2016-12-08 MED ORDER — CEFAZOLIN SODIUM-DEXTROSE 2-4 GM/100ML-% IV SOLN
2.0000 g | INTRAVENOUS | Status: AC
  Administered 2016-12-11: 2 g via INTRAVENOUS
  Filled 2016-12-08: qty 100

## 2016-12-10 NOTE — Anesthesia Preprocedure Evaluation (Addendum)
Anesthesia Evaluation  Patient identified by MRN, date of birth, ID band Patient awake    Reviewed: Allergy & Precautions, H&P , NPO status , Patient's Chart, lab work & pertinent test results  Airway Mallampati: I  TM Distance: >3 FB Neck ROM: Full    Dental no notable dental hx. (+) Poor Dentition, Dental Advisory Given, Chipped, Missing   Pulmonary neg pulmonary ROS,    Pulmonary exam normal breath sounds clear to auscultation       Cardiovascular hypertension, Pt. on medications  Rhythm:Regular Rate:Normal     Neuro/Psych  Headaches, Seizures -, Well Controlled,  PSYCHIATRIC DISORDERS negative psych ROS   GI/Hepatic Neg liver ROS, GERD  Medicated and Controlled,  Endo/Other  negative endocrine ROS  Renal/GU ESRFRenal diseaseS/p kidney transplant  negative genitourinary   Musculoskeletal   Abdominal   Peds  Hematology negative hematology ROS (+)   Anesthesia Other Findings   Reproductive/Obstetrics negative OB ROS                            Anesthesia Physical  Anesthesia Plan  ASA: III  Anesthesia Plan: General   Post-op Pain Management:    Induction: Intravenous  PONV Risk Score and Plan: 2 and Ondansetron, Treatment may vary due to age or medical condition and Midazolam  Airway Management Planned: Oral ETT  Additional Equipment:   Intra-op Plan:   Post-operative Plan: Extubation in OR  Informed Consent: I have reviewed the patients History and Physical, chart, labs and discussed the procedure including the risks, benefits and alternatives for the proposed anesthesia with the patient or authorized representative who has indicated his/her understanding and acceptance.   Dental advisory given and Dental Advisory Given  Plan Discussed with: CRNA and Anesthesiologist  Anesthesia Plan Comments:        Anesthesia Quick Evaluation

## 2016-12-11 ENCOUNTER — Encounter (HOSPITAL_COMMUNITY)
Admission: RE | Disposition: A | Discharge status: Home / Self Care | Payer: Self-pay | Source: Ambulatory Visit | Attending: Plastic Surgery

## 2016-12-11 ENCOUNTER — Ambulatory Visit (HOSPITAL_COMMUNITY): Payer: Medicare Other | Admitting: Anesthesiology

## 2016-12-11 ENCOUNTER — Ambulatory Visit (HOSPITAL_COMMUNITY)
Admission: RE | Admit: 2016-12-11 | Discharge: 2016-12-11 | Disposition: A | Discharge status: Home / Self Care | Payer: Medicare Other | Source: Ambulatory Visit | Attending: Plastic Surgery | Admitting: Plastic Surgery

## 2016-12-11 ENCOUNTER — Encounter (HOSPITAL_COMMUNITY): Payer: Self-pay | Admitting: *Deleted

## 2016-12-11 DIAGNOSIS — Z940 Kidney transplant status: Secondary | ICD-10-CM | POA: Insufficient documentation

## 2016-12-11 DIAGNOSIS — L728 Other follicular cysts of the skin and subcutaneous tissue: Secondary | ICD-10-CM | POA: Diagnosis not present

## 2016-12-11 DIAGNOSIS — Q851 Tuberous sclerosis: Secondary | ICD-10-CM | POA: Insufficient documentation

## 2016-12-11 DIAGNOSIS — K219 Gastro-esophageal reflux disease without esophagitis: Secondary | ICD-10-CM | POA: Diagnosis not present

## 2016-12-11 DIAGNOSIS — L720 Epidermal cyst: Secondary | ICD-10-CM | POA: Diagnosis not present

## 2016-12-11 DIAGNOSIS — N186 End stage renal disease: Secondary | ICD-10-CM | POA: Diagnosis not present

## 2016-12-11 DIAGNOSIS — R221 Localized swelling, mass and lump, neck: Secondary | ICD-10-CM | POA: Diagnosis present

## 2016-12-11 DIAGNOSIS — Z85528 Personal history of other malignant neoplasm of kidney: Secondary | ICD-10-CM | POA: Insufficient documentation

## 2016-12-11 DIAGNOSIS — I10 Essential (primary) hypertension: Secondary | ICD-10-CM | POA: Insufficient documentation

## 2016-12-11 DIAGNOSIS — L729 Follicular cyst of the skin and subcutaneous tissue, unspecified: Secondary | ICD-10-CM | POA: Diagnosis not present

## 2016-12-11 DIAGNOSIS — I120 Hypertensive chronic kidney disease with stage 5 chronic kidney disease or end stage renal disease: Secondary | ICD-10-CM | POA: Diagnosis not present

## 2016-12-11 HISTORY — PX: MASS EXCISION: SHX2000

## 2016-12-11 HISTORY — DX: Presence of spectacles and contact lenses: Z97.3

## 2016-12-11 HISTORY — DX: Disorder of the skin and subcutaneous tissue, unspecified: L98.9

## 2016-12-11 HISTORY — DX: Family history of other specified conditions: Z84.89

## 2016-12-11 HISTORY — DX: Cardiac murmur, unspecified: R01.1

## 2016-12-11 HISTORY — DX: Restless legs syndrome: G25.81

## 2016-12-11 LAB — BASIC METABOLIC PANEL
ANION GAP: 11 (ref 5–15)
BUN: 26 mg/dL — ABNORMAL HIGH (ref 6–20)
CO2: 23 mmol/L (ref 22–32)
Calcium: 9.7 mg/dL (ref 8.9–10.3)
Chloride: 103 mmol/L (ref 101–111)
Creatinine, Ser: 1.91 mg/dL — ABNORMAL HIGH (ref 0.61–1.24)
GFR calc Af Amer: 48 mL/min — ABNORMAL LOW (ref >60)
GFR calc non Af Amer: 42 mL/min — ABNORMAL LOW (ref >60)
GLUCOSE: 98 mg/dL (ref 65–99)
POTASSIUM: 4.1 mmol/L (ref 3.5–5.1)
Sodium: 137 mmol/L (ref 135–145)

## 2016-12-11 LAB — CBC WITH DIFFERENTIAL/PLATELET
BASOS ABS: 0 10*3/uL (ref 0.0–0.1)
BASOS PCT: 0 %
EOS PCT: 4 %
Eosinophils Absolute: 0.6 10*3/uL (ref 0.0–0.7)
HEMATOCRIT: 36.7 % — AB (ref 39.0–52.0)
Hemoglobin: 12.3 g/dL — ABNORMAL LOW (ref 13.0–17.0)
Lymphocytes Relative: 30 %
Lymphs Abs: 3.7 10*3/uL (ref 0.7–4.0)
MCH: 29.4 pg (ref 26.0–34.0)
MCHC: 33.5 g/dL (ref 30.0–36.0)
MCV: 87.6 fL (ref 78.0–100.0)
MONO ABS: 0.9 10*3/uL (ref 0.1–1.0)
Monocytes Relative: 7 %
NEUTROS ABS: 7.4 10*3/uL (ref 1.7–7.7)
Neutrophils Relative %: 59 %
Platelets: 226 10*3/uL (ref 150–400)
RBC: 4.19 MIL/uL — AB (ref 4.22–5.81)
RDW: 13.2 % (ref 11.5–15.5)
WBC: 12.7 10*3/uL — AB (ref 4.0–10.5)

## 2016-12-11 SURGERY — EXCISION MASS
Anesthesia: General | Site: Neck | Laterality: Left

## 2016-12-11 MED ORDER — LIDOCAINE 2% (20 MG/ML) 5 ML SYRINGE
INTRAMUSCULAR | Status: AC
  Filled 2016-12-11: qty 5

## 2016-12-11 MED ORDER — MIDAZOLAM HCL 2 MG/2ML IJ SOLN
INTRAMUSCULAR | Status: DC | PRN
  Administered 2016-12-11: 2 mg via INTRAVENOUS

## 2016-12-11 MED ORDER — FENTANYL CITRATE (PF) 250 MCG/5ML IJ SOLN
INTRAMUSCULAR | Status: AC
  Filled 2016-12-11: qty 5

## 2016-12-11 MED ORDER — FENTANYL CITRATE (PF) 100 MCG/2ML IJ SOLN
INTRAMUSCULAR | Status: DC | PRN
  Administered 2016-12-11: 100 ug via INTRAVENOUS

## 2016-12-11 MED ORDER — PROPOFOL 10 MG/ML IV BOLUS
INTRAVENOUS | Status: DC | PRN
  Administered 2016-12-11: 150 mg via INTRAVENOUS

## 2016-12-11 MED ORDER — ONDANSETRON HCL 4 MG/2ML IJ SOLN
INTRAMUSCULAR | Status: AC
  Filled 2016-12-11: qty 2

## 2016-12-11 MED ORDER — ACETAMINOPHEN-CODEINE #3 300-30 MG PO TABS: 1.0000 | ORAL_TABLET | ORAL | 0 refills | Status: DC | PRN

## 2016-12-11 MED ORDER — MIDAZOLAM HCL 2 MG/2ML IJ SOLN
INTRAMUSCULAR | Status: AC
  Filled 2016-12-11: qty 2

## 2016-12-11 MED ORDER — ONDANSETRON HCL 4 MG/2ML IJ SOLN
INTRAMUSCULAR | Status: DC | PRN
  Administered 2016-12-11: 4 mg via INTRAVENOUS

## 2016-12-11 MED ORDER — PROPOFOL 10 MG/ML IV BOLUS
INTRAVENOUS | Status: AC
  Filled 2016-12-11: qty 20

## 2016-12-11 MED ORDER — MEPERIDINE HCL 25 MG/ML IJ SOLN: 6.2500 mg | INTRAMUSCULAR | Status: DC | PRN

## 2016-12-11 MED ORDER — PHENYLEPHRINE 40 MCG/ML (10ML) SYRINGE FOR IV PUSH (FOR BLOOD PRESSURE SUPPORT)
PREFILLED_SYRINGE | INTRAVENOUS | Status: AC
  Filled 2016-12-11: qty 20

## 2016-12-11 MED ORDER — PHENYLEPHRINE 40 MCG/ML (10ML) SYRINGE FOR IV PUSH (FOR BLOOD PRESSURE SUPPORT)
PREFILLED_SYRINGE | INTRAVENOUS | Status: AC
  Filled 2016-12-11: qty 10

## 2016-12-11 MED ORDER — LIDOCAINE-EPINEPHRINE 1 %-1:100000 IJ SOLN
INTRAMUSCULAR | Status: AC
  Filled 2016-12-11: qty 1

## 2016-12-11 MED ORDER — FENTANYL CITRATE (PF) 100 MCG/2ML IJ SOLN: 25.0000 ug | INTRAMUSCULAR | Status: DC | PRN

## 2016-12-11 MED ORDER — BUPIVACAINE-EPINEPHRINE 0.25% -1:200000 IJ SOLN
INTRAMUSCULAR | Status: AC
  Filled 2016-12-11: qty 1

## 2016-12-11 MED ORDER — ONDANSETRON HCL 4 MG/2ML IJ SOLN: 4.0000 mg | Freq: Once | INTRAMUSCULAR | Status: DC | PRN

## 2016-12-11 MED ORDER — ROCURONIUM BROMIDE 10 MG/ML (PF) SYRINGE
PREFILLED_SYRINGE | INTRAVENOUS | Status: DC | PRN
  Administered 2016-12-11: 50 mg via INTRAVENOUS

## 2016-12-11 MED ORDER — 0.9 % SODIUM CHLORIDE (POUR BTL) OPTIME
TOPICAL | Status: DC | PRN
  Administered 2016-12-11: 1000 mL

## 2016-12-11 MED ORDER — BUPIVACAINE-EPINEPHRINE 0.25% -1:200000 IJ SOLN
INTRAMUSCULAR | Status: DC | PRN
  Administered 2016-12-11: 8 mL

## 2016-12-11 MED ORDER — SUGAMMADEX SODIUM 200 MG/2ML IV SOLN
INTRAVENOUS | Status: AC
  Filled 2016-12-11: qty 2

## 2016-12-11 MED ORDER — BACITRACIN 500 UNIT/GM EX OINT
TOPICAL_OINTMENT | CUTANEOUS | Status: DC | PRN
Start: 2016-12-11 — End: 2016-12-11
  Administered 2016-12-11: 1 via TOPICAL

## 2016-12-11 MED ORDER — ROCURONIUM BROMIDE 10 MG/ML (PF) SYRINGE
PREFILLED_SYRINGE | INTRAVENOUS | Status: AC
  Filled 2016-12-11: qty 5

## 2016-12-11 MED ORDER — SUGAMMADEX SODIUM 200 MG/2ML IV SOLN
INTRAVENOUS | Status: DC | PRN
  Administered 2016-12-11: 200 mg via INTRAVENOUS

## 2016-12-11 MED ORDER — LACTATED RINGERS IV SOLN
INTRAVENOUS | Status: DC
  Administered 2016-12-11: 10:00:00 via INTRAVENOUS

## 2016-12-11 MED ORDER — LIDOCAINE 2% (20 MG/ML) 5 ML SYRINGE
INTRAMUSCULAR | Status: DC | PRN
  Administered 2016-12-11: 100 mg via INTRAVENOUS

## 2016-12-11 MED ORDER — PHENYLEPHRINE 40 MCG/ML (10ML) SYRINGE FOR IV PUSH (FOR BLOOD PRESSURE SUPPORT)
PREFILLED_SYRINGE | INTRAVENOUS | Status: DC | PRN
  Administered 2016-12-11 (×7): 80 ug via INTRAVENOUS

## 2016-12-11 MED ORDER — BACITRACIN ZINC 500 UNIT/GM EX OINT
TOPICAL_OINTMENT | CUTANEOUS | Status: AC
  Filled 2016-12-11: qty 28.35

## 2016-12-11 SURGICAL SUPPLY — 32 items
BLADE SURG 15 STRL LF DISP TIS (BLADE) ×1 IMPLANT
BLADE SURG 15 STRL SS (BLADE) ×2
CHLORAPREP W/TINT 26ML (MISCELLANEOUS) ×2 IMPLANT
CONT SPEC 4OZ CLIKSEAL STRL BL (MISCELLANEOUS) ×2 IMPLANT
COVER SURGICAL LIGHT HANDLE (MISCELLANEOUS) ×2 IMPLANT
DRAPE U-SHAPE 76X120 STRL (DRAPES) ×2 IMPLANT
ELECT COATED BLADE 2.86 ST (ELECTRODE) ×2 IMPLANT
ELECT NEEDLE BLADE 2-5/6 (NEEDLE) ×2 IMPLANT
ELECT REM PT RETURN 9FT ADLT (ELECTROSURGICAL) ×2
ELECTRODE REM PT RTRN 9FT ADLT (ELECTROSURGICAL) ×1 IMPLANT
GAUZE SPONGE 4X4 12PLY STRL (GAUZE/BANDAGES/DRESSINGS) ×2 IMPLANT
GLOVE BIO SURGEON STRL SZ 6 (GLOVE) ×4 IMPLANT
GLOVE BIOGEL PI IND STRL 6.5 (GLOVE) ×1 IMPLANT
GLOVE BIOGEL PI INDICATOR 6.5 (GLOVE) ×1
GLOVE SURG SS PI 6.5 STRL IVOR (GLOVE) ×2 IMPLANT
GLOVE SURG SS PI 7.0 STRL IVOR (GLOVE) ×2 IMPLANT
GOWN STRL REUS W/ TWL LRG LVL3 (GOWN DISPOSABLE) ×2 IMPLANT
GOWN STRL REUS W/TWL LRG LVL3 (GOWN DISPOSABLE) ×4
KIT BASIN OR (CUSTOM PROCEDURE TRAY) ×2 IMPLANT
NEEDLE 18GX1X1/2 (RX/OR ONLY) (NEEDLE) ×2 IMPLANT
NEEDLE 27GAX1X1/2 (NEEDLE) ×2 IMPLANT
NS IRRIG 1000ML POUR BTL (IV SOLUTION) ×2 IMPLANT
PENCIL BUTTON HOLSTER BLD 10FT (ELECTRODE) ×2 IMPLANT
SUCTION FRAZIER HANDLE 10FR (MISCELLANEOUS) ×1
SUCTION TUBE FRAZIER 10FR DISP (MISCELLANEOUS) ×1 IMPLANT
SUT MNCRL AB 4-0 PS2 18 (SUTURE) ×2 IMPLANT
SUT PROLENE 5 0 P 3 (SUTURE) ×2 IMPLANT
SWAB COLLECTION DEVICE MRSA (MISCELLANEOUS) ×2 IMPLANT
SWAB CULTURE ESWAB REG 1ML (MISCELLANEOUS) ×2 IMPLANT
TAPE CLOTH SURG 4X10 WHT LF (GAUZE/BANDAGES/DRESSINGS) ×2 IMPLANT
TOWEL OR 17X24 6PK STRL BLUE (TOWEL DISPOSABLE) ×2 IMPLANT
TRAY ENT MC OR (CUSTOM PROCEDURE TRAY) ×2 IMPLANT

## 2016-12-11 NOTE — Transfer of Care (Signed)
Immediate Anesthesia Transfer of Care Note  Patient: Geanie Cooley  Procedure(s) Performed: Procedure(s): EXCISION OF BENIGN LESION OF THE NECK WITH LAYERED CLOSURE (Left)  Patient Location: PACU  Anesthesia Type:General  Level of Consciousness: awake, alert , drowsy and patient cooperative  Airway & Oxygen Therapy: Patient Spontanous Breathing and Patient connected to face mask oxygen  Post-op Assessment: Report given to RN and Post -op Vital signs reviewed and stable  Post vital signs: Reviewed and stable  Last Vitals:  Vitals:   12/11/16 1003  BP: 128/90  Pulse: 82  Resp: 20  Temp: 36.8 C    Last Pain:  Vitals:   12/11/16 1003  TempSrc: Oral      Patients Stated Pain Goal: 6 (14/44/58 4835)  Complications: No apparent anesthesia complications

## 2016-12-11 NOTE — Op Note (Signed)
Operative Note   DATE OF OPERATION: 7.30.18  LOCATION: [facility]-outpatient  SURGICAL DIVISION: Plastic Surgery  PREOPERATIVE DIAGNOSES:  1. Complex cyst left neck 2. Tuberous sclerosis  POSTOPERATIVE DIAGNOSES:  same  PROCEDURE:  1. Excision benign lesion left neck 4 cm 2. Layered closure left neck 5 cm  SURGEON: Irene Limbo MD MBA  ASSISTANT: none  ANESTHESIA:  General.   EBL: 5 ml  COMPLICATIONS: None immediate.   INDICATIONS FOR PROCEDURE:  The patient, Tyler Alvarez, is a 41 y.o. male born on Mar 28, 1976, is here for excision left neck cyst. Patient has tuberous sclerosis with multiple soft tissue cysts. Current area of concern has had prior incision drainage a few years ago with new swelling and drainage inferior to prior scar.   FINDINGS: Single large cyst inferior to prior left neck scar with multiple smaller subcutaneous cysts surrounding this.  DESCRIPTION OF PROCEDURE:  The patient's operative site was marked with the patient in the preoperative area. The patient was taken to the operating room. SCDs were placed and IV antibiotics were given. The patient's operative site was prepped and draped in a sterile fashion. A time out was performed and all information was confirmed to be correct.  Local anesthetic infiltrated surrounding mass. Sharp incision made around prior scar. Skin flaps elevated off mass inferiorly to 4 cm diameter to encompass largest mass. This was dissected from surrounding subcutaneous tissue and underlying muscle and excised. Scissors used to excise multiple additional cysts associated with elevated skin flap and scar margins. Wound irrigated and hemostasis obtained. Plication sutures of 4-0 monocryl used to tack elevated skin flap to underlying muscle. Additional 4-0 monocryl placed in dermis. Skin closure completed with running 5-0 prolene, length 5 cm. Antibiotic ointment and dry dressing applied.  The patient was allowed to wake from anesthesia,  extubated and taken to the recovery room in satisfactory condition.   SPECIMENS: left neck cyst  DRAINS: none  Irene Limbo, MD [facility] Plastic & Reconstructive Surgery [phone], pin [phone]

## 2016-12-11 NOTE — Interval H&P Note (Signed)
History and Physical Interval Note:  12/11/2016 12:00 PM  Tyler Alvarez  has presented today for surgery, with the diagnosis of LEFT NECK CYST  The various methods of treatment have been discussed with the patient and family. After consideration of risks, benefits and other options for treatment, the patient has consented to  Procedure(s): EXCISION OF BENIGN LESION OF THE NECK WITH LAYERED CLOSURE (Left) as a surgical intervention .  The patient's history has been reviewed, patient examined, no change in status, stable for surgery.  I have reviewed the patient's chart and labs.  Questions were answered to the patient's satisfaction.     Destiny Hagin

## 2016-12-11 NOTE — Anesthesia Postprocedure Evaluation (Signed)
Anesthesia Post Note  Patient: Tyler Alvarez  Procedure(s) Performed: Procedure(s) (LRB): EXCISION OF BENIGN LESION OF THE NECK WITH LAYERED CLOSURE (Left)     Patient location during evaluation: PACU Anesthesia Type: General Level of consciousness: awake and alert Pain management: pain level controlled Vital Signs Assessment: post-procedure vital signs reviewed and stable Respiratory status: spontaneous breathing, nonlabored ventilation, respiratory function stable and patient connected to nasal cannula oxygen Cardiovascular status: blood pressure returned to baseline and stable Postop Assessment: no signs of nausea or vomiting Anesthetic complications: no    Last Vitals:  Vitals:   12/11/16 1003  BP: 128/90  Pulse: 82  Resp: 20  Temp: 36.8 C    Last Pain:  Vitals:   12/11/16 1003  TempSrc: Oral                 Azrael Maddix

## 2016-12-11 NOTE — Anesthesia Procedure Notes (Signed)
Procedure Name: Intubation Date/Time: 12/11/2016 12:35 PM Performed by: Mervyn Gay Pre-anesthesia Checklist: Patient identified, Patient being monitored, Timeout performed, Emergency Drugs available and Suction available Patient Re-evaluated:Patient Re-evaluated prior to induction Oxygen Delivery Method: Circle System Utilized Preoxygenation: Pre-oxygenation with 100% oxygen Induction Type: IV induction Ventilation: Mask ventilation without difficulty Laryngoscope Size: Miller and 3 Grade View: Grade I Tube type: Oral Tube size: 7.5 mm Number of attempts: 1 Airway Equipment and Method: Stylet Placement Confirmation: ETT inserted through vocal cords under direct vision,  positive ETCO2 and breath sounds checked- equal and bilateral Secured at: 21 cm Tube secured with: Tape Dental Injury: Teeth and Oropharynx as per pre-operative assessment

## 2016-12-12 ENCOUNTER — Encounter (HOSPITAL_COMMUNITY): Payer: Self-pay | Admitting: Plastic Surgery

## 2017-01-06 ENCOUNTER — Other Ambulatory Visit: Payer: Self-pay | Admitting: Internal Medicine

## 2017-01-08 DIAGNOSIS — Z940 Kidney transplant status: Secondary | ICD-10-CM | POA: Diagnosis not present

## 2017-01-18 DIAGNOSIS — I10 Essential (primary) hypertension: Secondary | ICD-10-CM | POA: Diagnosis not present

## 2017-01-18 DIAGNOSIS — E559 Vitamin D deficiency, unspecified: Secondary | ICD-10-CM | POA: Diagnosis not present

## 2017-01-18 DIAGNOSIS — Z940 Kidney transplant status: Secondary | ICD-10-CM | POA: Diagnosis not present

## 2017-01-18 DIAGNOSIS — D899 Disorder involving the immune mechanism, unspecified: Secondary | ICD-10-CM | POA: Diagnosis not present

## 2017-01-18 DIAGNOSIS — N2581 Secondary hyperparathyroidism of renal origin: Secondary | ICD-10-CM | POA: Diagnosis not present

## 2017-01-31 DIAGNOSIS — Q851 Tuberous sclerosis: Secondary | ICD-10-CM | POA: Diagnosis not present

## 2017-02-07 DIAGNOSIS — H5213 Myopia, bilateral: Secondary | ICD-10-CM | POA: Diagnosis not present

## 2017-02-07 DIAGNOSIS — H43812 Vitreous degeneration, left eye: Secondary | ICD-10-CM | POA: Diagnosis not present

## 2017-03-22 ENCOUNTER — Other Ambulatory Visit: Payer: Self-pay | Admitting: Student

## 2017-03-22 DIAGNOSIS — Q851 Tuberous sclerosis: Secondary | ICD-10-CM

## 2017-03-25 MED ORDER — AMLODIPINE 2.5 MG TABLET
ORAL_TABLET | 2 refills | 0 days | Status: CP
Start: 2017-03-25 — End: 2017-04-13

## 2017-04-09 ENCOUNTER — Other Ambulatory Visit: Payer: Self-pay | Admitting: Internal Medicine

## 2017-04-13 ENCOUNTER — Ambulatory Visit
Admission: RE | Admit: 2017-04-13 | Discharge: 2017-04-13 | Disposition: A | Discharge status: Home / Self Care | Payer: MEDICARE

## 2017-04-13 ENCOUNTER — Ambulatory Visit
Admission: RE | Admit: 2017-04-13 | Discharge: 2017-04-13 | Disposition: A | Discharge status: Home / Self Care | Attending: Nephrology | Admitting: Nephrology

## 2017-04-13 ENCOUNTER — Ambulatory Visit: Admission: RE | Admit: 2017-04-13 | Discharge: 2017-04-13 | Disposition: A | Discharge status: Home / Self Care

## 2017-04-13 DIAGNOSIS — Z79899 Other long term (current) drug therapy: Secondary | ICD-10-CM

## 2017-04-13 DIAGNOSIS — Z940 Kidney transplant status: Principal | ICD-10-CM

## 2017-04-13 DIAGNOSIS — Z114 Encounter for screening for human immunodeficiency virus [HIV]: Secondary | ICD-10-CM

## 2017-04-13 DIAGNOSIS — D899 Disorder involving the immune mechanism, unspecified: Secondary | ICD-10-CM

## 2017-04-13 DIAGNOSIS — Z1159 Encounter for screening for other viral diseases: Secondary | ICD-10-CM

## 2017-04-13 DIAGNOSIS — D367 Benign neoplasm of other specified sites: Secondary | ICD-10-CM | POA: Diagnosis not present

## 2017-04-13 DIAGNOSIS — Z7952 Long term (current) use of systemic steroids: Secondary | ICD-10-CM | POA: Diagnosis not present

## 2017-04-13 DIAGNOSIS — Z86718 Personal history of other venous thrombosis and embolism: Secondary | ICD-10-CM | POA: Diagnosis not present

## 2017-04-13 DIAGNOSIS — Z4822 Encounter for aftercare following kidney transplant: Secondary | ICD-10-CM | POA: Diagnosis not present

## 2017-04-13 DIAGNOSIS — I10 Essential (primary) hypertension: Secondary | ICD-10-CM | POA: Diagnosis not present

## 2017-04-13 DIAGNOSIS — Z23 Encounter for immunization: Secondary | ICD-10-CM | POA: Diagnosis not present

## 2017-04-13 DIAGNOSIS — Z905 Acquired absence of kidney: Secondary | ICD-10-CM | POA: Diagnosis not present

## 2017-04-13 DIAGNOSIS — K219 Gastro-esophageal reflux disease without esophagitis: Secondary | ICD-10-CM | POA: Diagnosis not present

## 2017-04-13 DIAGNOSIS — Z7982 Long term (current) use of aspirin: Secondary | ICD-10-CM | POA: Diagnosis not present

## 2017-04-13 DIAGNOSIS — G43909 Migraine, unspecified, not intractable, without status migrainosus: Secondary | ICD-10-CM | POA: Diagnosis not present

## 2017-04-13 MED ORDER — OMEPRAZOLE 20 MG CAPSULE,DELAYED RELEASE
ORAL_CAPSULE | Freq: Every day | ORAL | 3 refills | 0.00000 days | Status: CP
Start: 2017-04-13 — End: 2018-05-22

## 2017-04-18 ENCOUNTER — Other Ambulatory Visit: Payer: Self-pay | Admitting: Internal Medicine

## 2017-04-18 DIAGNOSIS — Q851 Tuberous sclerosis: Secondary | ICD-10-CM

## 2017-04-18 MED ORDER — DIVALPROEX SODIUM 250 MG PO DR TAB
250.0000 mg | DELAYED_RELEASE_TABLET | Freq: Two times a day (BID) | ORAL | 0 refills | Status: DC
Start: 2017-04-18 — End: 2017-08-07

## 2017-05-21 DIAGNOSIS — I10 Essential (primary) hypertension: Secondary | ICD-10-CM | POA: Diagnosis not present

## 2017-05-21 DIAGNOSIS — Z940 Kidney transplant status: Secondary | ICD-10-CM | POA: Diagnosis not present

## 2017-05-21 DIAGNOSIS — D899 Disorder involving the immune mechanism, unspecified: Secondary | ICD-10-CM | POA: Diagnosis not present

## 2017-05-21 DIAGNOSIS — N2581 Secondary hyperparathyroidism of renal origin: Secondary | ICD-10-CM | POA: Diagnosis not present

## 2017-05-21 DIAGNOSIS — E559 Vitamin D deficiency, unspecified: Secondary | ICD-10-CM | POA: Diagnosis not present

## 2017-05-29 DIAGNOSIS — Z940 Kidney transplant status: Secondary | ICD-10-CM | POA: Diagnosis not present

## 2017-06-14 MED ORDER — PREDNISONE 5 MG TABLET: tablet | 6 refills | 0 days | Status: AC

## 2017-06-14 MED ORDER — PROGRAF 1 MG CAPSULE: 2 mg | capsule | 5 refills | 0 days

## 2017-06-14 MED ORDER — PREDNISONE 5 MG TABLET: tablet | 5 refills | 0 days

## 2017-06-14 MED ORDER — PREDNISONE 5 MG TABLET
ORAL_TABLET | Freq: Every day | ORAL | 6 refills | 0.00000 days | Status: CP
Start: 2017-06-14 — End: 2018-05-29

## 2017-06-14 MED ORDER — PROGRAF 1 MG CAPSULE
ORAL_CAPSULE | ORAL | 6 refills | 0.00000 days | Status: CP
Start: 2017-06-14 — End: 2017-06-14

## 2017-06-14 MED ORDER — PROGRAF 1 MG CAPSULE: capsule | 3 refills | 0 days | Status: AC

## 2017-06-14 NOTE — Unmapped (Signed)
New patient referral status from [facility] dated 06/13/17 sent to HIM Willette Brace June 14, 2017 10:55 AM

## 2017-06-15 NOTE — Unmapped (Signed)
[facility]   Patient Onboarding/Medication Counseling    Oscar [name] is a 42 y.o. male with a kidney transplant who I am counseling today on initiation of therapy.    Medication: Prograf 1mg     Verified patient's date of birth / HIPAA.      Education Provided: ??    Dose/Administration discussed: Take 2 capsules twice a day. This medication should be taken  without regard to food.     Storage requirements: this medicine should be stored at room temperature.     Side effects discussed: Patient declined further counseling he has been on medicine for years.  Patient will receive a Lexi-Comp drug information handout with shipment.    Handling precautions reviewed:  Patient declined further counseling he has been on medicine for years.    Drug Interactions: other medications reviewed and up to date in Epic.  Patient declined further counseling he has been on medicine for years.    Comorbidities/Allergies: reviewed and up to date in Epic.    Verified therapy is appropriate and should continue      Delivery Information    Anticipated copay of $3.80 for Prograf and $1.25 for prednisone reviewed with patient. Verified delivery address in [facility] and reviewed medication storage requirement.    Scheduled delivery date: 06/19/2017    Explained that we ship using UPS and this shipment will not require a signature.      Explained the services we provide at [facility] and that each month we would call to set up refills.  Stressed importance of returning phone calls so that we could ensure they receive their medications in time each month.  Informed patient that we should be setting up refills 7-10 days prior to when they will run out of medication.  Informed patient that welcome packet will be sent.      Patient verbalized understanding of the above information as well as how to contact the pharmacy at [phone] option 4 with any questions/concerns.        Patient Specific Needs      ? Patient has no physical or cognitive barriers.    ? Patient prefers to have medications discussed with  Patient or his mother    ? Patient is able to read and understand education materials at a high school level or above.        [name] [facility] Specialty Pharmacist

## 2017-06-15 NOTE — Unmapped (Signed)
[facility] Specialty Medication Referral: No PA required    Medication (Brand/Generic): Prograf & Prednisone    Initial [facility] Test Claim completed with resulted information below:  No PA required  Patient ABLE to fill at [facility]  Insurance Company:  [employer]  Anticipated Copay: $3.80 for Prograf, $1.25 for prednisone    As Co-pay is under $100 defined limit, per policy there will be no further investigation of need for financial assistance at this time unless patient requests. This referral has been communicated to the provider and handed off to the [facility] team for further processing and filling of prescribed medication.   ______________________________________________________________________  Please utilize this referral for viewing purposes as it will serve as the central location for all relevant documentation and updates.

## 2017-06-18 MED FILL — PROGRAF/1MG/CAP: PROGRAF/1MG/CAP | 30 days supply | Qty: 120 | Fill #0

## 2017-06-18 MED FILL — PREDNISONE/5MG/TABS: PREDNISONE/5MG/TABS | 30 days supply | Qty: 30 | Fill #0

## 2017-06-19 MED ORDER — PROGRAF 1 MG CAPSULE
ORAL_CAPSULE | 3 refills | 0 days | Status: CP
Start: 2017-06-19 — End: 2018-05-29

## 2017-07-05 NOTE — Unmapped (Signed)
2/21: mom Oscar [name] states got letter from ins that they don't want to cover brand anymore. It is refill too soon today so I can't tell yet. Told mom to go on and contact txp coordinator with this info - we will also see what ins tells [place] next week when we run it - has almost 2 weeks supply left, so hopefully can figure this out before patient supply is too low.      [name] Refill and Clinical Coordination Note  Medication(s): prograf    Oscar [name], DOB: 15-Aug-1975  Phone: [phone] (home) , Alternate phone contact: N/A  Shipping address: [address]  [facility] [place] [address]  Phone or address changes today?: No  All above HIPAA information verified.  Insurance changes? No    Completed refill and clinical call assessment today to schedule patient's medication shipment from the [facility] [phone]).      MEDICATION RECONCILIATION    Confirmed the medication and dosage are correct and have not changed: Yes, regimen is correct and unchanged.    Were there any changes to your medication(s) in the past month:  No, there are no changes reported at this time.    ADHERENCE    Is this medicine transplant or covered by Medicare Part B? No.    Prograf 1 mg   Quantity filled last month: 120   # of tablets left on hand: 13 DAYS LEFT    Prednisone 5mg , last filled: 30, left: 13 days left    Did you miss any doses in the past 4 weeks? No missed doses reported.  Adherence counseling provided? Not needed     SIDE EFFECT MANAGEMENT    Are you tolerating your medication?:  Oscar [name] reports tolerating the medication.  Side effect management discussed: None      Therapy is appropriate and should be continued.    Evidence of clinical benefit: See Epic note from 04/13/17      FINANCIAL/SHIPPING    Delivery Scheduled: Yes, Expected medication delivery date: 07/13/17   Additional medications refilled: PREDNISONE    The patient will receive an FSI print out for each medication shipped and additional FDA Medication Guides as required.  Patient education from [name] or [name] may also be included in the shipment.    Oscar [name] did not have any additional questions at this time.    Delivery address validated in FSI scheduling system: Yes, address listed above is correct.      We will follow up with patient monthly for standard refill processing and delivery.      Thank you,  Thad Ranger   [facility] Specialty Pharmacist

## 2017-07-12 MED FILL — PREDNISONE/5MG/TABS: PREDNISONE/5MG/TABS | 30 days supply | Qty: 30 | Fill #1

## 2017-07-12 MED FILL — PROGRAF/1MG/CAP: PROGRAF/1MG/CAP | 30 days supply | Qty: 120 | Fill #1

## 2017-07-13 NOTE — Unmapped (Signed)
Insurance company PA for prograf submitted to cover my meds, ID # [id number]

## 2017-07-24 ENCOUNTER — Other Ambulatory Visit: Payer: Self-pay | Admitting: Internal Medicine

## 2017-07-31 DIAGNOSIS — Z940 Kidney transplant status: Secondary | ICD-10-CM | POA: Diagnosis not present

## 2017-08-02 NOTE — Unmapped (Signed)
Spoke with patient and patient's mother.  They said [facility] saying [facility]s.   They are fully aware they need to use [place] for his prograf and prednisone.  At this time the mother says he has plenty and asked [place] to call back next week.  Rescheduling refill call.  Everlean Cherry  [facility]

## 2017-08-06 ENCOUNTER — Encounter: Payer: Self-pay | Admitting: *Deleted

## 2017-08-07 ENCOUNTER — Encounter: Payer: Self-pay | Admitting: Diagnostic Neuroimaging

## 2017-08-07 ENCOUNTER — Encounter (INDEPENDENT_AMBULATORY_CARE_PROVIDER_SITE_OTHER): Payer: Self-pay

## 2017-08-07 ENCOUNTER — Other Ambulatory Visit: Payer: Self-pay | Admitting: Internal Medicine

## 2017-08-07 ENCOUNTER — Ambulatory Visit (INDEPENDENT_AMBULATORY_CARE_PROVIDER_SITE_OTHER): Payer: Medicare Other | Admitting: Diagnostic Neuroimaging

## 2017-08-07 VITALS — BP 144/90 | HR 97 | Ht 72.0 in | Wt 144.2 lb

## 2017-08-07 DIAGNOSIS — G40909 Epilepsy, unspecified, not intractable, without status epilepticus: Secondary | ICD-10-CM | POA: Diagnosis not present

## 2017-08-07 DIAGNOSIS — Q851 Tuberous sclerosis: Secondary | ICD-10-CM

## 2017-08-07 NOTE — Progress Notes (Signed)
[facility]  PATIENT: Tyler Alvarez DOB: [birth year]  REFERRING CLINICIAN: Yevette Edwards, MD HISTORY FROM: patient and mother  REASON FOR VISIT: new consult    HISTORICAL  CHIEF COMPLAINT:  Chief Complaint  Patient presents with  . NP Tobey Bride MD  . Seizures    Had kidney transplant 2001. Takes Depakote.  Transfer of care from [facility]. Was a pt of Dr. France Ravens    HISTORY OF PRESENT ILLNESS:   42 year old male with history of tuberous sclerosis, here for evaluation of seizure disorder.  Patient had generalized seizures at a young age, but last generalized seizure occurred at age 8 years old.  Then he had complex partial seizures consisting of staring spells, which stopped around age 44 or 42 years old.  Since that time no further seizures.  Sometimes he has brief "weird sensation" in his head, lasting 3 times per day for a few seconds at a time.  No convulsions or loss of consciousness or altered consciousness with these episodes.  These were considered to possibly represent "simple partial seizures" versus ours.  Patient has been on Depakote for many years.  At some point he was switched from brand name to generic.  He has been stable on generic Depakote for past 1-2 years.  Patient is concerned that he is on generic instead of brand name Depakote.  However he has not had any breakthrough seizures on generic medication.  Patient last saw neurology clinic at [facility] in 08/05/15, who returned patient back to PCP.  Antiseizure medication refills per PCP since that time.   REVIEW OF SYSTEMS: Full 14 system review of systems performed and negative with exception of: As per HPI.  ALLERGIES: Allergies  Allergen Reactions  . Sulfur Itching and Nausea And Vomiting  . Erythromycin Nausea And Vomiting    HOME MEDICATIONS: Outpatient Medications Prior to Visit  Medication Sig Dispense Refill  . acetaminophen (TYLENOL) 325 MG tablet Take 650 mg by mouth every 6  (six) hours as needed for mild pain.    Marland Kitchen acetaminophen-codeine (TYLENOL #3) 300-30 MG tablet Take 1-2 tablets by mouth every 4 (four) hours as needed for moderate pain. 20 tablet 0  . amLODipine (NORVASC) 2.5 MG tablet Take 2.5 mg by mouth daily. May take an additional 2.5 mg as needed for systolic BP of 973 or higher    . aspirin EC 81 MG tablet Take 81 mg by mouth daily.    . benzoyl peroxide 10 % gel Apply 1 application topically daily.    . Cholecalciferol (VITAMIN D3) 2000 units capsule Take 2,000 Units by mouth daily.    . divalproex (DEPAKOTE) 250 MG DR tablet Take 1 tablet (250 mg total) by mouth 2 (two) times daily. 180 tablet 0  . gabapentin (NEURONTIN) 300 MG capsule TAKE ONE CAPSULE BY MOUTH NIGHTLY 90 capsule 0  . imipramine (TOFRANIL) 25 MG tablet Take 50 mg by mouth at bedtime.     . predniSONE (DELTASONE) 5 MG tablet Take 5 mg by mouth daily.     . tacrolimus (PROGRAF) 1 MG capsule Take 2 mg by mouth 2 (two) times daily.     . brompheniramine-pseudoephedrine (DIMETAPP) 1-15 MG/5ML ELIX Take 5 mLs by mouth 2 (two) times daily as needed for allergies.     No facility-administered medications prior to visit.     PAST MEDICAL HISTORY: Past Medical History:  Diagnosis Date  . ADD (attention deficit disorder)   . Benign skin lesion of neck  left neck cyst  . Chronic kidney disease    s/p transplant 2001  . Family history of adverse reaction to anesthesia    Pt mother stated " I think that he stooped breathing, they called a code but he came through okay"  . GERD (gastroesophageal reflux disease)   . Headache    Migraines  . Heart murmur   . Hypertension   . Immune deficiency disorder ([place])   . Restless leg syndrome   . Seizures ([place])    treated by Dr. Melrose Nakayama  . 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[place])   . Wears glasses     PAST SURGICAL HISTORY: Past Surgical History:  Procedure Laterality Date  . AV FISTULA PLACEMENT    . KIDNEY TRANSPLANT  2001  . MASS EXCISION Left  12/11/2016   Procedure: EXCISION OF BENIGN LESION OF THE NECK WITH LAYERED CLOSURE;  Surgeon: Irene Limbo, MD;  Location: [facility];  Service: Plastics;  Laterality: Left;  Marland Kitchen MULTIPLE TOOTH EXTRACTIONS    . REVISON OF ARTERIOVENOUS FISTULA Left 07/16/2014   Procedure: EXCISION ANEURYSMAL AREA OF LEFT ARTERIOVENOUS FISTULA;  Surgeon: Serafina Mitchell, MD;  Location: [facility];  Service: Vascular;  Laterality: Left;    FAMILY HISTORY: Family History  Problem Relation Age of Onset  . Hypothyroidism Mother   . Cancer Mother 12       cervical  . Hypertension Mother   . COPD Mother   . COPD Father 21       COPD  . Ulcers Father   . COPD Brother   . Heart disease Brother   . Cancer Other     SOCIAL HISTORY:  Social History   Socioeconomic History  . Marital status: Single    Spouse name: Not on file  . Number of children: Not on file  . Years of education: Not on file  . Highest education level: Not on file  Occupational History  . Not on file  Social Needs  . Financial resource strain: Not on file  . Food insecurity:    Worry: Not on file    Inability: Not on file  . Transportation needs:    Medical: Not on file    Non-medical: Not on file  Tobacco Use  . Smoking status: Never Smoker  . Smokeless tobacco: Never Used  Substance and Sexual Activity  . Alcohol use: No  . Drug use: No  . Sexual activity: Yes  Lifestyle  . Physical activity:    Days per week: Not on file    Minutes per session: Not on file  . Stress: Not on file  Relationships  . Social connections:    Talks on phone: Not on file    Gets together: Not on file    Attends religious service: Not on file    Active member of club or organization: Not on file    Attends meetings of clubs or organizations: Not on file    Relationship status: Not on file  . Intimate partner violence:    Fear of current or ex partner: Not on file    Emotionally abused: Not on file    Physically abused: Not on file    Forced  sexual activity: Not on file  Other Topics Concern  . Not on file  Social History Narrative   Caffeine one daily.  Monster every other day.  Lives at home with mom.  Education: 8th grade.   Single.  None children.       PHYSICAL  EXAM  GENERAL EXAM/CONSTITUTIONAL: Vitals:  Vitals:   08/07/17 1459  BP: (!) 144/90  Pulse: 97  Weight: 144 lb 3.2 oz (65.4 kg)  Height: 6' (1.829 m)     Body mass index is 19.56 kg/m.  Visual Acuity Screening   Right eye Left eye Both eyes  Without correction:     With correction: 20/30 20/30      Patient is in no distress; well developed, nourished and groomed; neck is supple  CARDIOVASCULAR:  Examination of carotid arteries is normal; no carotid bruits  Regular rate and rhythm, no murmurs  Examination of peripheral vascular system by observation and palpation is normal  EYES:  Ophthalmoscopic exam of optic discs and posterior segments is normal; no papilledema or hemorrhages  MUSCULOSKELETAL:  Gait, strength, tone, movements noted in Neurologic exam below  NEUROLOGIC: MENTAL STATUS:  No flowsheet data found.  awake, alert, oriented to person, place and time  recent and remote memory intact  normal attention and concentration  language fluent, comprehension intact, naming intact,   fund of Tyler appropriate  CRANIAL NERVE:   2nd - no papilledema on fundoscopic exam  2nd, 3rd, 4th, 6th - pupils equal and reactive to light, visual fields full to confrontation, extraocular muscles intact, no nystagmus  5th - facial sensation symmetric  7th - facial strength symmetric  8th - hearing intact  9th - palate elevates symmetrically, uvula midline  11th - shoulder shrug symmetric  12th - tongue protrusion midline  MOTOR:   normal bulk and tone, full strength in the BUE, BLE  SENSORY:   normal and symmetric to light touch, temperature, vibration  COORDINATION:   finger-nose-finger, fine finger movements  normal  REFLEXES:   deep tendon reflexes present and symmetric  GAIT/STATION:   narrow based gait    DIAGNOSTIC DATA (LABS, IMAGING, TESTING) - I reviewed patient records, labs, notes, testing and imaging myself where available.  Lab Results  Component Value Date   WBC 12.7 (H) 12/11/2016   HGB 12.3 (L) 12/11/2016   HCT 36.7 (L) 12/11/2016   MCV 87.6 12/11/2016   PLT 226 12/11/2016      Component Value Date/Time   NA 137 12/11/2016 0946   NA 141 09/14/2014   K 4.1 12/11/2016 0946   CL 103 12/11/2016 0946   CO2 23 12/11/2016 0946   GLUCOSE 98 12/11/2016 0946   BUN 26 (H) 12/11/2016 0946   BUN 20 09/14/2014   CREATININE 1.91 (H) 12/11/2016 0946   CALCIUM 9.7 12/11/2016 0946   PROT 6.0 07/16/2014 1710   ALBUMIN 3.4 (L) 07/16/2014 1710   AST 15 09/14/2014   ALT 15 09/14/2014   ALKPHOS 82 09/14/2014   BILITOT 0.7 07/16/2014 1710   GFRNONAA 42 (L) 12/11/2016 0946   GFRAA 48 (L) 12/11/2016 0946   No results found for: CHOL, HDL, LDLCALC, LDLDIRECT, TRIG, CHOLHDL No results found for: HGBA1C Lab Results  Component Value Date   VITAMINB12 528 07/24/2014   No results found for: TSH   01/06/06 MRI brain FINDINGS: There are multiple areas of abnormal T2/FLAIR signal in the cortex and subcortical white matter bilaterally, predominantly in the superior aspect of the cerebral hemispheres.  There are multiple subcentimeter nonenhancing lesions of decreased T1 and T2 signal to gray white junction bilaterally, which may represent calcifications.This could be better evaluated with CT of the head.  There is no hydrocephalus.There is subtle nodularity identified in the region of the atria of the lateral ventricles bilaterally.  There is  a subependymal nodule on the left just above and lateral to the region of the foramen of Monroe measuring 5 mm.  There is a T1 hyperintense structure paralleling the corpus callosum extending from the anterior aspect of the body to  the undersurface of the splenium consistent with a lipoma.  There is no evidence for acute infarction or abnormal enhancement.  IMPRESSION: Findings consistent with tuberous sclerosis.    ASSESSMENT AND PLAN  42 y.o. year old male here with history of tuberous sclerosis and seizure disorder, well controlled since age 54 years old.  Patient is stable on generic divalproex 500 mg at bedtime. Medications may be filled by PCP. May follow up with neurology as needed.    Dx:  1. 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[place])   2. Seizure disorder ([place])     PLAN:  SEIZURE DISORDER - continue divalproex 500mg  at bedtime (ok to continue generic from my standpoint)  RESTLESS LEG SYNDROME - continue gabapentin 300mg  at bedtime  Return if symptoms worsen or fail to improve, for return to PCP.    Penni Bombard, MD 5/91/6384, 6:65 PM Certified in Neurology, Neurophysiology and Neuroimaging  [facility] [address] [place], [place] [address] [phone]

## 2017-08-08 NOTE — Unmapped (Signed)
Patient still has plenty of prograf and prednisone on hand at this time  Rescheduling call for 4/5  I verified the doses of prograf and prednisone match the scripts we have on file for patient at this time  Oscar [name]    *they think [facility] and with [place] [facility] when insurance switching to preferring [place] so he's got extra on hand at this time

## 2017-08-09 DIAGNOSIS — Z940 Kidney transplant status: Secondary | ICD-10-CM | POA: Diagnosis not present

## 2017-08-09 DIAGNOSIS — E559 Vitamin D deficiency, unspecified: Secondary | ICD-10-CM | POA: Diagnosis not present

## 2017-08-09 DIAGNOSIS — N183 Chronic kidney disease, stage 3 (moderate): Secondary | ICD-10-CM | POA: Diagnosis not present

## 2017-08-09 NOTE — Unmapped (Signed)
[facility] new consult office note from 3.26.19 sent to HIM Suan Halter August 09, 2017 10:49 AM

## 2017-08-17 NOTE — Unmapped (Signed)
Patient does not need a refill of specialty medication at this time. Moving specialty refill reminder call to appropriate date and removed call attempt data.  Spoke with patient's mother [name] and she states he still has another 30 days on Prednisone 5mg  and still has little over 60 capsules of the Prograf (15 Days). And he does not need any refills at this time.

## 2017-08-27 ENCOUNTER — Ambulatory Visit (INDEPENDENT_AMBULATORY_CARE_PROVIDER_SITE_OTHER): Payer: Medicare Other

## 2017-08-27 ENCOUNTER — Ambulatory Visit (HOSPITAL_COMMUNITY)
Admission: EM | Admit: 2017-08-27 | Discharge: 2017-08-27 | Disposition: A | Discharge status: Home / Self Care | Payer: Medicare Other | Attending: Internal Medicine | Admitting: Internal Medicine

## 2017-08-27 ENCOUNTER — Encounter (HOSPITAL_COMMUNITY): Payer: Self-pay | Admitting: Family Medicine

## 2017-08-27 DIAGNOSIS — M79671 Pain in right foot: Secondary | ICD-10-CM

## 2017-08-27 DIAGNOSIS — S99921A Unspecified injury of right foot, initial encounter: Secondary | ICD-10-CM | POA: Diagnosis not present

## 2017-08-27 MED ORDER — DOXYCYCLINE HYCLATE 100 MG PO CAPS: 100.0000 mg | ORAL_CAPSULE | Freq: Two times a day (BID) | ORAL | 0 refills | Status: AC

## 2017-08-27 NOTE — ED Triage Notes (Signed)
Pt here for right foot injury that occurred this past Thursday. He hit it on the foot of the bed. Pt is still having pain, redness and swelling to the right foot.

## 2017-08-27 NOTE — Discharge Instructions (Addendum)
Please begin doxycycline twice daily for the next 10 days.  Please apply ice to foot multiple times a day for 15-20 minutes.  Please take Tylenol for your pain.  Please monitor the redness and if redness continues to spread please return.  Please return if symptoms not improving with treatment in approximately 1-2 weeks.

## 2017-08-28 NOTE — ED Provider Notes (Signed)
[place]    CSN: [id number] Arrival date & time: 08/27/17  1220     History   Chief Complaint Chief Complaint  Patient presents with  . Foot Pain    HPI Tyler Alvarez is a 42 y.o. male history of CKD, seizures, presenting today with concern for right foot injury/redness. He notes 4 days ago he hit his foot on the leg of a bed. Having pain, redness and swelling to his foot. Has tried soaking the foot as well as tylenol. Denies history of previous clot. Denies calf pain.   HPI  Past Medical History:  Diagnosis Date  . ADD (attention deficit disorder)   . Benign skin lesion of neck    left neck cyst  . Chronic kidney disease    s/p transplant 2001  . Family history of adverse reaction to anesthesia    Pt mother stated " I think that he stooped breathing, they called a code but he came through okay"  . GERD (gastroesophageal reflux disease)   . Headache    Migraines  . Heart murmur   . Hypertension   . Immune deficiency disorder ([place])   . Restless leg syndrome   . Seizures ([place])    treated by Dr. Melrose Nakayama  . 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[place])   . Wears glasses     Patient Active Problem List   Diagnosis Date Noted  . Right ankle pain 11/15/2015  . Constipation   . Normocytic anemia   . Renal transplant, status post   . Essential hypertension   . Aneurysm ([place]) 07/16/2014  . Abscess of neck 04/05/2012  . 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[place]) 04/05/2012  . HEARING LOSS, LEFT EAR 11/25/2008    Past Surgical History:  Procedure Laterality Date  . AV FISTULA PLACEMENT    . KIDNEY TRANSPLANT  2001  . MASS EXCISION Left 12/11/2016   Procedure: EXCISION OF BENIGN LESION OF THE NECK WITH LAYERED CLOSURE;  Surgeon: Irene Limbo, MD;  Location: [facility];  Service: Plastics;  Laterality: Left;  Marland Kitchen MULTIPLE TOOTH EXTRACTIONS    . REVISON OF ARTERIOVENOUS FISTULA Left 07/16/2014   Procedure: EXCISION ANEURYSMAL AREA OF LEFT ARTERIOVENOUS FISTULA;  Surgeon: Serafina Mitchell, MD;   Location: [facility];  Service: Vascular;  Laterality: Left;       Home Medications    Prior to Admission medications   Medication Sig Start Date End Date Taking? Authorizing Provider  acetaminophen (TYLENOL) 325 MG tablet Take 650 mg by mouth every 6 (six) hours as needed for mild pain.    [provider]  acetaminophen-codeine (TYLENOL #3) 300-30 MG tablet Take 1-2 tablets by mouth every 4 (four) hours as needed for moderate pain. 12/11/16   Irene Limbo, MD  amLODipine (NORVASC) 2.5 MG tablet Take 2.5 mg by mouth daily. May take an additional 2.5 mg as needed for systolic BP of 841 or higher    [provider]  aspirin EC 81 MG tablet Take 81 mg by mouth daily.    [provider]  benzoyl peroxide 10 % gel Apply 1 application topically daily.    [provider]  Cholecalciferol (VITAMIN D3) 2000 units capsule Take 2,000 Units by mouth daily.    [provider]  divalproex (DEPAKOTE) 250 MG DR tablet TAKE 1 TABLET BY MOUTH TWICE A DAY 08/08/17   Mayo, Pete Pelt, MD  doxycycline (VIBRAMYCIN) 100 MG capsule Take 1 capsule (100 mg total) by mouth 2 (two) times daily for 10 days. 08/27/17  09/06/17  Wieters, Hallie C, PA-C  gabapentin (NEURONTIN) 300 MG capsule TAKE ONE CAPSULE BY MOUTH NIGHTLY 07/24/17   Mayo, Pete Pelt, MD  imipramine (TOFRANIL) 25 MG tablet Take 50 mg by mouth at bedtime.     [provider]  predniSONE (DELTASONE) 5 MG tablet Take 5 mg by mouth daily.     [provider]  tacrolimus (PROGRAF) 1 MG capsule Take 2 mg by mouth 2 (two) times daily.     [provider]    Family History Family History  Problem Relation Age of Onset  . Hypothyroidism Mother   . Cancer Mother 63       cervical  . Hypertension Mother   . COPD Mother   . COPD Father 35       COPD  . Ulcers Father   . COPD Brother   . Heart disease Brother   . Cancer Other     Social History Social History   Tobacco Use  . Smoking  status: Never Smoker  . Smokeless tobacco: Never Used  Substance Use Topics  . Alcohol use: No  . Drug use: No     Allergies   Sulfur and Erythromycin   Review of Systems Review of Systems  Constitutional: Negative for fatigue and fever.  Respiratory: Negative for shortness of breath.   Cardiovascular: Negative for chest pain.  Gastrointestinal: Negative for nausea and vomiting.  Musculoskeletal: Positive for arthralgias, gait problem, joint swelling and myalgias.  Skin: Positive for color change. Negative for pallor and wound.  Neurological: Negative for dizziness, seizures, syncope, weakness, light-headedness and numbness.     Physical Exam Triage Vital Signs ED Triage Vitals  Enc Vitals Group     BP 08/27/17 1315 138/84     Pulse Rate 08/27/17 1315 (!) 112     Resp 08/27/17 1315 18     Temp 08/27/17 1315 98.9 F (37.2 C)     Temp src --      SpO2 08/27/17 1315 100 %     Weight --      Height --      Head Circumference --      Peak Flow --      Pain Score 08/27/17 1256 6     Pain Loc --      Pain Edu? --      Excl. in [place]? --    No data found.  Updated Vital Signs BP 138/84   Pulse (!) 112   Temp 98.9 F (37.2 C)   Resp 18   SpO2 100%   Visual Acuity Right Eye Distance:   Left Eye Distance:   Bilateral Distance:    Right Eye Near:   Left Eye Near:    Bilateral Near:     Physical Exam  Constitutional: He appears well-developed and well-nourished.  HENT:  Head: Normocephalic and atraumatic.  Eyes: Conjunctivae are normal.  Neck: Neck supple.  Cardiovascular: Normal rate.  Pulmonary/Chest: Effort normal. No respiratory distress.  Abdominal: Soft. There is no tenderness.  Musculoskeletal: He exhibits edema and tenderness.  Right foot: no obvious deformity, patient holding foot inverted, but able to return to a neutral position. Erythema near base of great toe extending more peripheral. Moderate swelling to distal end of MCP's.  Significant  tenderness to palpation of base of great toe; mild increase in warmth overlying erythema. Dorsalis pedis 2+  Non tender to palpation of lateral and medial malleolus, Full ROM of right ankle.  Neurological: He is  alert.  Skin: Skin is warm and dry.  Psychiatric: He has a normal mood and affect.  Nursing note and vitals reviewed.    UC Treatments / Results  Labs (all labs ordered are listed, but only abnormal results are displayed) Labs Reviewed - No data to display  EKG None Radiology Dg Foot Complete Right  Result Date: 08/27/2017 CLINICAL DATA:  Right foot pain after fall last week. EXAM: RIGHT FOOT COMPLETE - 3+ VIEW COMPARISON:  Radiographs of August 29, 2010. FINDINGS: There is no evidence of fracture or dislocation. There is no evidence of arthropathy or other focal bone abnormality. Soft tissues are unremarkable. IMPRESSION: Normal right foot. Electronically Signed   By: Marijo Conception, M.D.   On: 08/27/2017 13:12    Procedures Procedures (including critical care time)  Medications Ordered in UC Medications - No data to display   Initial Impression / Assessment and Plan / UC Course  I have reviewed the triage vital signs and the nursing notes.  Pertinent labs & imaging results that were available during my care of the patient were reviewed by me and considered in my medical decision making (see chart for details).     No fracture on Xray, possible contusion vs cellulitis. Given increased warmth and pattern of erythema will begin on antibiotics to treat for cellulitis. Also advised RICE/Tylenol for pain. Discussed strict return precautions. Patient verbalized understanding and is agreeable with plan.    Final Clinical Impressions(s) / UC Diagnoses   Final diagnoses:  Foot pain, right    ED Discharge Orders        Ordered    doxycycline (VIBRAMYCIN) 100 MG capsule  2 times daily     08/27/17 1430       Controlled Substance Prescriptions [place] Controlled  Substance Registry consulted? Not Applicable   Janith Lima, [place] 08/28/17 0740

## 2017-08-29 NOTE — Unmapped (Signed)
[facility] Specialty Pharmacy Refill Coordination Note  Specialty Medication(s): Prograf 1mg   Additional Medications shipped: Prednisone 5mg     [name], DOB: July 11, 1975  Phone: [phone] (home) , Alternate phone contact: N/A  Phone or address changes today?: No  All above HIPAA information was verified with patient's family member. (Mother)  Shipping Address: [address]  [facility] [place] [address]   Insurance changes? No    Completed refill call assessment today to schedule patient's medication shipment from the [facility] [phone]).      Confirmed the medication and dosage are correct and have not changed: Yes, regimen is correct and unchanged.    Confirmed patient started or stopped the following medications in the past month:  No, there are no changes reported at this time.    Are you tolerating your medication?:  Elis reports tolerating the medication.    ADHERENCE    (Below is required for Medicare Part B or Transplant patients only - per drug):   How many tablets were dispensed last month:     Prograf 1 mg   Quantity filled last month: 120   # of tablets left on hand: 8 DAYS    Did you miss any doses in the past 4 weeks? Yes.  Yassir reports missing 2 days of medication therapy in the last 4 weeks.  Randell reports him missing his doses due to him over sleeping in the morning or just forgetting to take it at night as the cause of their non-adherance.    FINANCIAL/SHIPPING    Delivery Scheduled: Yes, Expected medication delivery date: 09/05/2017     The patient will receive an FSI print out for each medication shipped and additional FDA Medication Guides as required.  Patient education from [place] or [name] may also be included in the shipment    Rustin did not have any additional questions at this time.    Delivery address validated in FSI scheduling system: Yes, address listed in FSI is correct.    We will follow up with patient monthly for standard refill processing and delivery.      Thank you,  Tamala Fothergill   [facility] Specialty Technician

## 2017-09-04 MED FILL — PREDNISONE/5MG/TABS: PREDNISONE/5MG/TABS | 30 days supply | Qty: 30 | Fill #2

## 2017-09-04 MED FILL — PROGRAF/1MG/CAP: PROGRAF/1MG/CAP | 30 days supply | Qty: 120 | Fill #2

## 2017-09-13 DIAGNOSIS — N2581 Secondary hyperparathyroidism of renal origin: Secondary | ICD-10-CM | POA: Diagnosis not present

## 2017-09-13 DIAGNOSIS — D899 Disorder involving the immune mechanism, unspecified: Secondary | ICD-10-CM | POA: Diagnosis not present

## 2017-09-13 DIAGNOSIS — I10 Essential (primary) hypertension: Secondary | ICD-10-CM | POA: Diagnosis not present

## 2017-09-13 DIAGNOSIS — E559 Vitamin D deficiency, unspecified: Secondary | ICD-10-CM | POA: Diagnosis not present

## 2017-09-13 DIAGNOSIS — Z940 Kidney transplant status: Secondary | ICD-10-CM | POA: Diagnosis not present

## 2017-09-18 MED ORDER — IMIPRAMINE 25 MG TABLET
ORAL_TABLET | 3 refills | 0 days | Status: CP
Start: 2017-09-18 — End: 2018-08-30

## 2017-09-18 NOTE — Unmapped (Signed)
Pt request for RX Refill

## 2017-09-25 NOTE — Unmapped (Signed)
[facility] Special[facility] Pharmacy Refill Coordination Note  Specialty Medication(s): Prograf 1 mg 2 cap BID; Prednisone 5 mg once daily  Additional Medications shipped: None    Oscar [name], DOB: Feb 27, 1976  Phone: [phone] (home) , Alternate phone contact: N/A  Phone or address changes today?: No  All above HIPAA information was verified with patient's family member.  Shipping Address: [address]  [facility] [place] [address]   Insurance changes? No    Completed refill call assessment today to schedule patient's medication shipment from the [facility] [phone]).      Confirmed the medication and dosage are correct and have not changed: Yes, regimen is correct and unchanged.    Confirmed patient started or stopped the following medications in the past month:  No, there are no changes reported at this time.    Are you tolerating your medication?:  Oscar [name] reports tolerating the medication.    ADHERENCE    (Below is required for Medicare Part B or Transplant patients only - per drug):   Prednisone 5 mg  How many tablets were dispensed last month: 30  Patient currently has 10 DAYS remaining.    Prograf 1 mg   Quantity filled last month: 120   # of tablets left on hand: 10 DAYS remaining.          Did you miss any doses in the past 4 weeks? No missed doses reported.    FINANCIAL/SHIPPING    Delivery Scheduled: Yes, Expected medication delivery date: 09/27/17     The patient will receive an FSI print out for each medication shipped and additional FDA Medication Guides as required.  Patient education from [place] or [name] may also be included in the shipment    Oscar [name] did not have any additional questions at this time.    Delivery address validated in FSI scheduling system: Yes, address listed in FSI is correct.    We will follow up with patient monthly for standard refill processing and delivery.      Thank you,  Burnett Corrente   [facility] Specialty PharmD Candidate

## 2017-09-27 MED FILL — PROGRAF/1MG/CAP: PROGRAF/1MG/CAP | 30 days supply | Qty: 120 | Fill #3

## 2017-09-27 MED FILL — PREDNISONE/5MG/TABS: PREDNISONE/5MG/TABS | 30 days supply | Qty: 30 | Fill #3

## 2017-10-05 ENCOUNTER — Other Ambulatory Visit: Payer: Self-pay

## 2017-10-05 ENCOUNTER — Ambulatory Visit (INDEPENDENT_AMBULATORY_CARE_PROVIDER_SITE_OTHER): Payer: Medicare Other | Admitting: Internal Medicine

## 2017-10-05 ENCOUNTER — Encounter: Payer: Self-pay | Admitting: Internal Medicine

## 2017-10-05 DIAGNOSIS — M109 Gout, unspecified: Secondary | ICD-10-CM

## 2017-10-05 MED ORDER — PREDNISONE 50 MG PO TABS: 50.0000 mg | ORAL_TABLET | Freq: Every day | ORAL | 0 refills | Status: DC

## 2017-10-05 NOTE — Progress Notes (Signed)
   [facility] Phone: [phone]  Subjective:  Tyler Alvarez is a 42 year old male presenting to clinic for right great toe pain. This has been going on for the last month. He initially hit his toe in the wheel of a bed. The pain persisted after that. The toe then became warm, red, and exquisitely tender. He had severe pain just with light touch of the toe. The pain was worse with walking. He discussed the issue with his nephrologist, who thought that he had gout. He was placed on a three day course of Prednisone ~3 weeks ago. The Prednisone helped a lot, but came back after he finished the medicine. No fevers, no chills.  ROS: See HPI for pertinent positives and negatives  Past Medical History- tuberous sclerosis, hx aneurysm, s/p renal transplant, HTN  Family history reviewed for today's visit. No changes.  Social history- patient is a never smoker  Objective: BP 140/70   Pulse 95   Temp 98.1 F (36.7 C) (Oral)   Ht 6' (1.829 m)   Wt 143 lb (64.9 kg)   SpO2 95%   BMI 19.39 kg/m  Gen: NAD, alert, cooperative with exam Right Foot: warmth, redness, and tenderness to palpation present at the base of the right great toe. No bony deformities. Full ROM.  Assessment/Plan: Gout: Of the right great toe. Improved with a 3 day course of Prednisone, but did not completely resolve.  - Will treat with a 5 day course of Prednisone - Would like to avoid NSAIDs in the setting of CKD IIIB and hx of renal transplant - Follow-up if no improvement   Hyman Bible, MD PGY-3

## 2017-10-05 NOTE — Patient Instructions (Signed)
For your gout- please take Prednisone 50mg  once daily for 5 days. Let us know if your gout does not get better after that.  We will see you back in 1 year.  -Dr. Brett Albino

## 2017-10-09 DIAGNOSIS — N183 Chronic kidney disease, stage 3 unspecified: Secondary | ICD-10-CM | POA: Insufficient documentation

## 2017-10-09 DIAGNOSIS — M109 Gout, unspecified: Secondary | ICD-10-CM | POA: Insufficient documentation

## 2017-10-09 NOTE — Assessment & Plan Note (Signed)
Of the right great toe. Improved with a 3 day course of Prednisone, but did not completely resolve.  - Will treat with a 5 day course of Prednisone - Would like to avoid NSAIDs in the setting of CKD IIIB and hx of renal transplant - Follow-up if no improvement

## 2017-10-10 DIAGNOSIS — Z940 Kidney transplant status: Secondary | ICD-10-CM | POA: Diagnosis not present

## 2017-10-10 DIAGNOSIS — Z79899 Other long term (current) drug therapy: Secondary | ICD-10-CM | POA: Diagnosis not present

## 2017-10-18 NOTE — Unmapped (Signed)
[facility] Special[facility] Pharmacy Refill Coordination Note    Specialty Medication(s) to be Shipped:   Transplant: Prograf 1mg   Other medication(s) to be shipped: Prednisone 5mg      Oscar [name], DOB: May 07, 1976  Phone: [phone] (home)   Shipping Address: [address]  [facility] [place] [address]    All above HIPAA information was verified with patient's caregiver.  (Mother)  Completed refill call assessment today to schedule patient's medication shipment from the [facility] [phone]).       Specialty medication(s) and dose(s) confirmed: Regimen is correct and unchanged.   Changes to medications: Malek reports no changes reported at this time.  Changes to insurance: No  Questions for the pharmacist: No    The patient will receive an FSI print out for each medication shipped and additional FDA Medication Guides as required.  Patient education from [place] or [name] may also be included in the shipment.    DISEASE-SPECIFIC INFORMATION        N/A    ADHERENCE     Medication Adherence    Patient reported X missed doses in the last month:  0          MEDICARE PART B DOCUMENTATION     Prograf 1mg : Patient has 12 days worth of  capsules on hand.    SHIPPING     Shipping address confirmed in FSI.     Delivery Scheduled: Yes, Expected medication delivery date: 10/29/2017 via UPS or courier.     Sheritta Deeg Leodis Binet   [facility] Shared [facility] Specialty Technician

## 2017-10-26 ENCOUNTER — Other Ambulatory Visit: Payer: Self-pay | Admitting: Internal Medicine

## 2017-10-26 MED FILL — PREDNISONE/5MG/TABS: PREDNISONE/5MG/TABS | 30 days supply | Qty: 30 | Fill #4

## 2017-10-26 MED FILL — PROGRAF/1MG/CAP: PROGRAF/1MG/CAP | 30 days supply | Qty: 120 | Fill #4

## 2017-11-03 ENCOUNTER — Other Ambulatory Visit: Payer: Self-pay | Admitting: Internal Medicine

## 2017-11-03 DIAGNOSIS — Q851 Tuberous sclerosis: Secondary | ICD-10-CM

## 2017-11-22 MED FILL — PROGRAF/1MG/CAP: PROGRAF/1MG/CAP | 30 days supply | Qty: 120 | Fill #5

## 2017-11-22 MED FILL — PREDNISONE/5MG/TABS: PREDNISONE/5MG/TABS | 30 days supply | Qty: 30 | Fill #5

## 2017-11-22 NOTE — Unmapped (Addendum)
[facility] Specialty Pharmacy Refill and Clinical Coordination Note  Medication(s): Prograf 1mg      Oscar [name], DOB: Dec 21, 1975  Phone: [phone] (home) , Alternate phone contact: N/A  Shipping address: [address]  [facility] [address]  Phone or address changes today?: No  All above HIPAA information verified.  Insurance changes? No    Completed refill and clinical call assessment today to schedule patient's medication shipment from the [facility] [phone]).      MEDICATION RECONCILIATION    Confirmed the medication and dosage are correct and have not changed: Yes, regimen is correct and unchanged.    Were there any changes to your medication(s) in the past month:  No, there are no changes reported at this time.    ADHERENCE    Is this medicine transplant or covered by Medicare Part B? No.    Prograf 1 mg   Quantity filled last month: 30   # of tablets left on hand: 5    Did you miss any doses in the past 4 weeks? No missed doses reported.  Adherence counseling provided? Not needed     SIDE EFFECT MANAGEMENT    Are you tolerating your medication?:  Oscar [name] reports tolerating the medication.  Side effect management discussed: None      Therapy is appropriate and should be continued.    Evidence of clinical benefit: See Epic note from 04/13/2017      FINANCIAL/SHIPPING    Delivery Scheduled: Yes, Expected medication delivery date: 11/23/2017   Additional medications refilled: Prednisone 5mg     The patient will receive an FSI print out for each medication shipped and additional FDA Medication Guides as required.  Patient education from [place] or [name] may also be included in the shipment.    Eulon did not have any additional questions at this time.    Delivery address validated in FSI scheduling system: Yes, address listed above is correct.      We will follow up with patient monthly for standard refill processing and delivery.      Thank you,  Oscar [name]   [facility] Specialty Pharmacist

## 2017-12-11 DIAGNOSIS — Z940 Kidney transplant status: Secondary | ICD-10-CM | POA: Diagnosis not present

## 2017-12-13 NOTE — Unmapped (Signed)
[facility] Specialty Pharmacy Refill Coordination Note    Specialty Medication(s) to be Shipped:   Transplant: Prograf 1mg  and Prednisone 5mg     Other medication(s) to be shipped:       Oscar [name], DOB: 1975/05/23  Phone: [phone] (home)   Shipping Address: [address]  [facility] [place] [address]    All above HIPAA information was verified with patient's family member.     Completed refill call assessment today to schedule patient's medication shipment from the [facility] [phone]).       Specialty medication(s) and dose(s) confirmed: Regimen is correct and unchanged.   Changes to medications: Oscar [name] reports no changes reported at this time.  Changes to insurance: No  Questions for the pharmacist: No    The patient will receive an FSI print out for each medication shipped and additional FDA Medication Guides as required.  Patient education from [place] or [name] may also be included in the shipment.    DISEASE/MEDICATION-SPECIFIC INFORMATION        N/A    ADHERENCE              MEDICARE PART B DOCUMENTATION         SHIPPING     Shipping address confirmed in FSI.     Delivery Scheduled: Yes, Expected medication delivery date: 080919 via UPS or courier.     Antonietta Barcelona   [facility] Shared [facility] Specialty Technician

## 2017-12-18 NOTE — Unmapped (Signed)
Current Outpatient Medications on File Prior to Visit   Medication Sig   ??? acetaminophen (TYLENOL) 325 MG tablet Take 650 mg by mouth.   ??? amLODIPine (NORVASC) 5 MG tablet Take 1 tablet (5 mg total) by mouth daily.   ??? aspirin (ASPIRIN LOW-STRENGTH) 81 MG chewable tablet Chew 81 mg. Frequency:QD   Dosage:81   MG  Instructions:  Note:Dose: 81 MG   ??? brompheniramin-phenylephrin-DM (DIMETAPP DM COLD-COUGH, PE,) 1-2.5-5 mg/5 mL Soln Take by mouth. Frequency:PRN   Dosage:10   ML  Instructions:  Note:Dose: 10 ML   ??? divalproex (DEPAKOTE) 250 MG DR tablet Take 2 tablets (500 mg total) by mouth daily at 0600. This prescription is written specifically for brand name depakote.   ??? gabapentin (NEURONTIN) 300 MG capsule Take 1 capsule (300 mg total) by mouth nightly.   ??? imipramine (TOFRANIL) 25 MG tablet TAKE 2 TABLETS BY MOUTH NIGHTLY   ??? omeprazole (PRILOSEC) 20 MG capsule Take 1 capsule (20 mg total) by mouth daily. Frequency:QD   Dosage:20   MG  Instructions:  Note:Dose: 20MG    ??? predniSONE (DELTASONE) 5 MG tablet TAKE 1 TABLET (5 MG TOTAL) BY MOUTH DAILY.   ??? PROGRAF 1 mg capsule Dosage:2   MG  Po bid  :Z94.0 kidney transplant, DAW1, brand medically necessary, tx date 02/22/2000     No current facility-administered medications on file prior to visit.

## 2017-12-20 MED FILL — PREDNISONE/5MG/TABS: PREDNISONE/5MG/TABS | 30 days supply | Qty: 30 | Fill #6

## 2017-12-20 MED FILL — PROGRAF/1MG/CAP: PROGRAF/1MG/CAP | 30 days supply | Qty: 120 | Fill #6

## 2018-01-16 ENCOUNTER — Other Ambulatory Visit: Payer: Self-pay

## 2018-01-16 DIAGNOSIS — Q851 Tuberous sclerosis: Secondary | ICD-10-CM

## 2018-01-16 MED ORDER — DIVALPROEX SODIUM 250 MG PO DR TAB: 250.0000 mg | DELAYED_RELEASE_TABLET | Freq: Two times a day (BID) | ORAL | 0 refills | Status: DC

## 2018-01-16 MED ORDER — GABAPENTIN 300 MG PO CAPS: 300.0000 mg | ORAL_CAPSULE | Freq: Every day | ORAL | 0 refills | Status: DC

## 2018-01-16 NOTE — Unmapped (Signed)
[facility] Special[facility] Pharmacy Refill Coordination Note    Specialty Medication(s) to be Shipped:   Transplant: Prograf 1mg  and Prednisone 5mg      [name], DOB: 1976/05/09  Phone: [phone] (home)   Shipping Address: [address]  [place] [address]    All above HIPAA information was verified with patient's caregiver. Mom, [name]    Completed refill call assessment today to schedule patient's medication shipment from the [facility] [phone]).       Specialty medication(s) and dose(s) confirmed: Regimen is correct and unchanged.   Changes to medications: Deryck reports no changes reported at this time.  Changes to insurance: No  Questions for the pharmacist: No    The patient will receive a drug information handout for each medication shipped and additional FDA Medication Guides as required.      DISEASE/MEDICATION-SPECIFIC INFORMATION        N/A    ADHERENCE     Medication Adherence    Patient reported X missed doses in the last month:  0         MEDICARE PART B DOCUMENTATION     Prograf 1mg : Patient has 9 days worth of capsules on hand.    SHIPPING     Shipping address confirmed in Epic.     Delivery Scheduled: Yes, Expected medication delivery date: 01/22/2018 via UPS or courier.     Kodiak Rollyson Leodis Binet   [facility] Shared [facility] Specialty Technician

## 2018-01-17 DIAGNOSIS — Z23 Encounter for immunization: Secondary | ICD-10-CM | POA: Diagnosis not present

## 2018-01-17 DIAGNOSIS — D899 Disorder involving the immune mechanism, unspecified: Secondary | ICD-10-CM | POA: Diagnosis not present

## 2018-01-17 DIAGNOSIS — Z940 Kidney transplant status: Secondary | ICD-10-CM | POA: Diagnosis not present

## 2018-01-17 DIAGNOSIS — I10 Essential (primary) hypertension: Secondary | ICD-10-CM | POA: Diagnosis not present

## 2018-01-17 DIAGNOSIS — N2581 Secondary hyperparathyroidism of renal origin: Secondary | ICD-10-CM | POA: Diagnosis not present

## 2018-01-18 MED ORDER — PROGRAF 1 MG CAPSULE
ORAL_CAPSULE | Freq: Two times a day (BID) | ORAL | 11 refills | 0.00000 days | Status: CP
Start: 2018-01-18 — End: 2018-05-29
  Filled 2018-01-21: qty 120, 30d supply, fill #0

## 2018-01-21 MED FILL — PREDNISONE 5 MG TABLET: 30 days supply | Qty: 30 | Fill #0

## 2018-01-21 MED FILL — PROGRAF 1 MG CAPSULE: 30 days supply | Qty: 120 | Fill #0 | Status: AC

## 2018-01-21 MED FILL — PREDNISONE 5 MG TABLET: 30 days supply | Qty: 30 | Fill #0 | Status: AC

## 2018-02-12 DIAGNOSIS — Z940 Kidney transplant status: Secondary | ICD-10-CM | POA: Diagnosis not present

## 2018-02-13 NOTE — Unmapped (Signed)
[facility] Specialty Pharmacy Refill Coordination Note  Specialty Medication(s): Prograf 1mg   Additional Medications shipped: prednisone    [name], DOB: [birth year]  Phone: [phone] (home) , Alternate phone contact: N/A  Phone or address changes today?: No  All above HIPAA information was verified with patient.  Shipping Address: [address]  [place] [address]   Insurance changes? No    Completed refill call assessment today to schedule patient's medication shipment from the [facility] [phone]).      Confirmed the medication and dosage are correct and have not changed: Yes, regimen is correct and unchanged.    Confirmed patient started or stopped the following medications in the past month:  No, there are no changes reported at this time.    Are you tolerating your medication?:  Ruhan reports tolerating the medication.    ADHERENCE    Prograf 1 mg   Quantity filled last month: 120   # of tablets left on hand: 9 days    Did you miss any doses in the past 4 weeks? No missed doses reported.    FINANCIAL/SHIPPING    Delivery Scheduled: Yes, Expected medication delivery date: 02/19/18     The patient will receive a drug information handout for each medication shipped and additional FDA Medication Guides as required.      Zaahir did not have any additional questions at this time.    Delivery address validated in Epic.    We will follow up with patient monthly for standard refill processing and delivery.      Thank you,  Lupita Shutter   [facility] Specialty Pharmacist

## 2018-02-18 MED FILL — PREDNISONE 5 MG TABLET: 30 days supply | Qty: 30 | Fill #1

## 2018-02-18 MED FILL — PROGRAF 1 MG CAPSULE: 30 days supply | Qty: 120 | Fill #1 | Status: AC

## 2018-02-18 MED FILL — PREDNISONE 5 MG TABLET: 30 days supply | Qty: 30 | Fill #1 | Status: AC

## 2018-02-18 MED FILL — PROGRAF 1 MG CAPSULE: ORAL | 30 days supply | Qty: 120 | Fill #1

## 2018-03-11 MED ORDER — AMLODIPINE 2.5 MG TABLET
ORAL_TABLET | 2 refills | 0 days | Status: CP
Start: 2018-03-11 — End: 2018-12-27

## 2018-03-13 NOTE — Unmapped (Signed)
[facility] Specialty Pharmacy Refill Coordination Note    Specialty Medication(s) to be Shipped:   Transplant: Prograf 1mg  and Prednisone 5mg     Other medication(s) to be shipped: none     Oscar [name], DOB: 1975/09/09  Phone: [phone] (home)       All above HIPAA information was verified with patient's caregiver.     Completed refill call assessment today to schedule patient's medication shipment from the [facility] [phone]).       Specialty medication(s) and dose(s) confirmed: Regimen is correct and unchanged.   Changes to medications: Dhairya reports no changes reported at this time.  Changes to insurance: No  Questions for the pharmacist: No    The patient will receive a drug information handout for each medication shipped and additional FDA Medication Guides as required.      DISEASE/MEDICATION-SPECIFIC INFORMATION        N/A    ADHERENCE     Medication Adherence    Patient reported X missed doses in the last month:  0                          MEDICARE PART B DOCUMENTATION     Prednisone 5mg : Patient has 7 days worth of tablets on hand.  Prograf 1mg : Patient has 7 days worth of capsules on hand.    SHIPPING     Shipping address confirmed in Epic.     Delivery Scheduled: Yes, Expected medication delivery date: 03/20/18  via UPS or courier.     Medication will be delivered via UPS to the home address in Epic WAM.    Oscar [name]   [facility] Shared [facility] Specialty [place]

## 2018-03-14 DIAGNOSIS — L720 Epidermal cyst: Secondary | ICD-10-CM | POA: Diagnosis not present

## 2018-03-14 DIAGNOSIS — Q851 Tuberous sclerosis: Secondary | ICD-10-CM | POA: Diagnosis not present

## 2018-03-19 MED FILL — PROGRAF 1 MG CAPSULE: ORAL | 30 days supply | Qty: 120 | Fill #2

## 2018-03-19 MED FILL — PREDNISONE 5 MG TABLET: 30 days supply | Qty: 30 | Fill #2

## 2018-03-19 MED FILL — PROGRAF 1 MG CAPSULE: 30 days supply | Qty: 120 | Fill #2 | Status: AC

## 2018-03-19 MED FILL — PREDNISONE 5 MG TABLET: 30 days supply | Qty: 30 | Fill #2 | Status: AC

## 2018-03-22 NOTE — H&P (Signed)
  Subjective:     Patient ID: Tyler Alvarez is a 42 y.o. male.  Last seen 01/2017 following excision facial cyst 11/2016.  He desires resection additional cysts. No recent antibiotics.  PMH significant for tuberous sclerosis. 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 Second kidney prophylactically removed and patient was dialysis dependent for period prior to renal transplant donated by brother, completed [facility]. On prednisone and prograf, followed by Dr. Joelyn [facility].   Has known SEM. Per patient had this for years, may be related to "cyst" heart muscle. Reports ECHO prior to transplant completed and no intervention recommended."     Objective:   Physical Exam  Constitutional: He is oriented to person, place, and time.  Cardiovascular: Normal rate and regular rhythm.  Pulmonary/Chest: Effort normal and breath sounds normal.  Neurological: He is alert and oriented to person, place, and time.   HEENT:  left transverse scar, no fluctuance, able to express pin point fluid from scar line, no cellulitis Right neck free of tuberous sclerosis skin changes Right preauricular 0.6 cm papular lesion likely fibroma Right NL fold with 1 cm cyst no erythema Left neck posterior to prior excision three separate areas: Superior 1 x 1 cm ballotable area Mid 1 x 1.5 cm Inferior 1 x 4 cm  Superior and inferior left neck masses appear to be associated with scar line, likely prior interventions on these     Assessment:     Tuberous sclerosis s/p excision multiple cysts left neck Hx renal transplant on immunosuppression, steroids    Plan:       Plan excision of cysts in OR.  Will address areas noted above. Counseled if inflamed or abscessed may need to leave open as he has had in past. Reviewed his medications place him at greater risk wound healing problems and infection. Additional risks anesthesia,  recurrence damage to adjacent structures reviewed. Asked him to trim beard prior to surgery.  Plan OP surgery.   Irene Limbo, MD [facility] [phone], pin [phone]

## 2018-03-29 ENCOUNTER — Encounter (HOSPITAL_COMMUNITY): Payer: Self-pay | Admitting: Urology

## 2018-03-29 ENCOUNTER — Other Ambulatory Visit: Payer: Self-pay

## 2018-03-29 NOTE — Progress Notes (Signed)
Spoke with [place] (mom) to go over medications, medical history and instructions for DOS. Reported that pt has ADD and cannot focus or remember everything, so wishes we call her instead to go over information. Per mom, pt can sign his own consent form. She reports that pt has not complained of any chest pain or sickness (no fever, cough, etc).

## 2018-04-01 ENCOUNTER — Other Ambulatory Visit: Payer: Self-pay

## 2018-04-01 ENCOUNTER — Ambulatory Visit (HOSPITAL_COMMUNITY): Payer: Medicare Other | Admitting: Certified Registered"

## 2018-04-01 ENCOUNTER — Encounter (HOSPITAL_COMMUNITY)
Admission: RE | Disposition: A | Discharge status: Home / Self Care | Payer: Self-pay | Source: Ambulatory Visit | Attending: Plastic Surgery

## 2018-04-01 ENCOUNTER — Encounter (HOSPITAL_COMMUNITY): Payer: Self-pay

## 2018-04-01 ENCOUNTER — Ambulatory Visit (HOSPITAL_COMMUNITY)
Admission: RE | Admit: 2018-04-01 | Discharge: 2018-04-01 | Disposition: A | Discharge status: Home / Self Care | Payer: Medicare Other | Source: Ambulatory Visit | Attending: Plastic Surgery | Admitting: Plastic Surgery

## 2018-04-01 DIAGNOSIS — D2221 Melanocytic nevi of right ear and external auricular canal: Secondary | ICD-10-CM | POA: Insufficient documentation

## 2018-04-01 DIAGNOSIS — D2239 Melanocytic nevi of other parts of face: Secondary | ICD-10-CM | POA: Diagnosis not present

## 2018-04-01 DIAGNOSIS — Z940 Kidney transplant status: Secondary | ICD-10-CM | POA: Insufficient documentation

## 2018-04-01 DIAGNOSIS — Q851 Tuberous sclerosis: Secondary | ICD-10-CM | POA: Insufficient documentation

## 2018-04-01 DIAGNOSIS — Z7952 Long term (current) use of systemic steroids: Secondary | ICD-10-CM | POA: Diagnosis not present

## 2018-04-01 DIAGNOSIS — I10 Essential (primary) hypertension: Secondary | ICD-10-CM | POA: Diagnosis not present

## 2018-04-01 DIAGNOSIS — R569 Unspecified convulsions: Secondary | ICD-10-CM | POA: Insufficient documentation

## 2018-04-01 DIAGNOSIS — L720 Epidermal cyst: Secondary | ICD-10-CM | POA: Insufficient documentation

## 2018-04-01 DIAGNOSIS — Z79899 Other long term (current) drug therapy: Secondary | ICD-10-CM | POA: Insufficient documentation

## 2018-04-01 DIAGNOSIS — N183 Chronic kidney disease, stage 3 (moderate): Secondary | ICD-10-CM | POA: Diagnosis not present

## 2018-04-01 DIAGNOSIS — K219 Gastro-esophageal reflux disease without esophagitis: Secondary | ICD-10-CM | POA: Diagnosis not present

## 2018-04-01 DIAGNOSIS — L728 Other follicular cysts of the skin and subcutaneous tissue: Secondary | ICD-10-CM | POA: Diagnosis not present

## 2018-04-01 DIAGNOSIS — I129 Hypertensive chronic kidney disease with stage 1 through stage 4 chronic kidney disease, or unspecified chronic kidney disease: Secondary | ICD-10-CM | POA: Diagnosis not present

## 2018-04-01 DIAGNOSIS — D2321 Other benign neoplasm of skin of right ear and external auricular canal: Secondary | ICD-10-CM | POA: Diagnosis not present

## 2018-04-01 HISTORY — DX: Anxiety disorder, unspecified: F41.9

## 2018-04-01 HISTORY — PX: MASS EXCISION: SHX2000

## 2018-04-01 LAB — CBC
HEMATOCRIT: 38.3 % — AB (ref 39.0–52.0)
HEMOGLOBIN: 11.9 g/dL — AB (ref 13.0–17.0)
MCH: 29 pg (ref 26.0–34.0)
MCHC: 31.1 g/dL (ref 30.0–36.0)
MCV: 93.4 fL (ref 80.0–100.0)
NRBC: 0 % (ref 0.0–0.2)
Platelets: 195 10*3/uL (ref 150–400)
RBC: 4.1 MIL/uL — ABNORMAL LOW (ref 4.22–5.81)
RDW: 13.2 % (ref 11.5–15.5)
WBC: 9.8 10*3/uL (ref 4.0–10.5)

## 2018-04-01 LAB — BASIC METABOLIC PANEL
Anion gap: 5 (ref 5–15)
BUN: 21 mg/dL — AB (ref 6–20)
CHLORIDE: 107 mmol/L (ref 98–111)
CO2: 26 mmol/L (ref 22–32)
Calcium: 9.3 mg/dL (ref 8.9–10.3)
Creatinine, Ser: 2.09 mg/dL — ABNORMAL HIGH (ref 0.61–1.24)
GFR, EST AFRICAN AMERICAN: 43 mL/min — AB (ref >60)
GFR, EST NON AFRICAN AMERICAN: 37 mL/min — AB (ref >60)
Glucose, Bld: 100 mg/dL — ABNORMAL HIGH (ref 70–99)
POTASSIUM: 4.2 mmol/L (ref 3.5–5.1)
SODIUM: 138 mmol/L (ref 135–145)

## 2018-04-01 SURGERY — EXCISION MASS
Anesthesia: General | Site: Face | Laterality: Bilateral

## 2018-04-01 MED ORDER — FENTANYL CITRATE (PF) 250 MCG/5ML IJ SOLN
INTRAMUSCULAR | Status: AC
  Filled 2018-04-01: qty 5

## 2018-04-01 MED ORDER — SODIUM CHLORIDE 0.9 % IV SOLN
INTRAVENOUS | Status: DC
  Administered 2018-04-01: 11:00:00 via INTRAVENOUS

## 2018-04-01 MED ORDER — ONDANSETRON HCL 4 MG/2ML IJ SOLN
INTRAMUSCULAR | Status: DC | PRN
  Administered 2018-04-01: 4 mg via INTRAVENOUS

## 2018-04-01 MED ORDER — BUPIVACAINE-EPINEPHRINE (PF) 0.25% -1:200000 IJ SOLN
INTRAMUSCULAR | Status: AC
  Filled 2018-04-01: qty 30

## 2018-04-01 MED ORDER — SUCCINYLCHOLINE CHLORIDE 20 MG/ML IJ SOLN
INTRAMUSCULAR | Status: DC | PRN
  Administered 2018-04-01: 180 mg via INTRAVENOUS

## 2018-04-01 MED ORDER — DEXAMETHASONE SODIUM PHOSPHATE 10 MG/ML IJ SOLN
INTRAMUSCULAR | Status: AC
  Filled 2018-04-01: qty 1

## 2018-04-01 MED ORDER — CEFAZOLIN SODIUM-DEXTROSE 2-4 GM/100ML-% IV SOLN
2.0000 g | INTRAVENOUS | Status: AC
  Administered 2018-04-01: 2 g via INTRAVENOUS
  Filled 2018-04-01: qty 100

## 2018-04-01 MED ORDER — BACITRACIN 500 UNIT/GM EX OINT
TOPICAL_OINTMENT | CUTANEOUS | Status: DC | PRN
  Administered 2018-04-01: 1 via TOPICAL

## 2018-04-01 MED ORDER — FENTANYL CITRATE (PF) 100 MCG/2ML IJ SOLN
INTRAMUSCULAR | Status: DC | PRN
  Administered 2018-04-01 (×2): 50 ug via INTRAVENOUS

## 2018-04-01 MED ORDER — LIDOCAINE-EPINEPHRINE 1 %-1:100000 IJ SOLN
INTRAMUSCULAR | Status: AC
  Filled 2018-04-01: qty 1

## 2018-04-01 MED ORDER — SUCCINYLCHOLINE CHLORIDE 200 MG/10ML IV SOSY
PREFILLED_SYRINGE | INTRAVENOUS | Status: AC
  Filled 2018-04-01: qty 10

## 2018-04-01 MED ORDER — MIDAZOLAM HCL 2 MG/2ML IJ SOLN
INTRAMUSCULAR | Status: DC | PRN
  Administered 2018-04-01: 2 mg via INTRAVENOUS

## 2018-04-01 MED ORDER — LIDOCAINE 2% (20 MG/ML) 5 ML SYRINGE
INTRAMUSCULAR | Status: AC
  Filled 2018-04-01: qty 5

## 2018-04-01 MED ORDER — BUPIVACAINE-EPINEPHRINE 0.25% -1:200000 IJ SOLN
INTRAMUSCULAR | Status: DC | PRN
  Administered 2018-04-01: 10 mL

## 2018-04-01 MED ORDER — DEXAMETHASONE SODIUM PHOSPHATE 10 MG/ML IJ SOLN
INTRAMUSCULAR | Status: DC | PRN
  Administered 2018-04-01: 10 mg via INTRAVENOUS

## 2018-04-01 MED ORDER — PHENYLEPHRINE 40 MCG/ML (10ML) SYRINGE FOR IV PUSH (FOR BLOOD PRESSURE SUPPORT)
PREFILLED_SYRINGE | INTRAVENOUS | Status: AC
  Filled 2018-04-01: qty 10

## 2018-04-01 MED ORDER — BACITRACIN ZINC 500 UNIT/GM EX OINT
TOPICAL_OINTMENT | CUTANEOUS | Status: AC
  Filled 2018-04-01: qty 28.35

## 2018-04-01 MED ORDER — ONDANSETRON HCL 4 MG/2ML IJ SOLN
INTRAMUSCULAR | Status: AC
  Filled 2018-04-01: qty 2

## 2018-04-01 MED ORDER — MIDAZOLAM HCL 2 MG/2ML IJ SOLN
INTRAMUSCULAR | Status: AC
  Filled 2018-04-01: qty 2

## 2018-04-01 MED ORDER — PROPOFOL 10 MG/ML IV BOLUS
INTRAVENOUS | Status: DC | PRN
  Administered 2018-04-01: 150 mg via INTRAVENOUS

## 2018-04-01 MED ORDER — PHENYLEPHRINE 40 MCG/ML (10ML) SYRINGE FOR IV PUSH (FOR BLOOD PRESSURE SUPPORT)
PREFILLED_SYRINGE | INTRAVENOUS | Status: DC | PRN
  Administered 2018-04-01 (×10): 80 ug via INTRAVENOUS

## 2018-04-01 MED ORDER — PROPOFOL 10 MG/ML IV BOLUS
INTRAVENOUS | Status: AC
  Filled 2018-04-01: qty 20

## 2018-04-01 MED ORDER — ROCURONIUM BROMIDE 50 MG/5ML IV SOSY
PREFILLED_SYRINGE | INTRAVENOUS | Status: AC
  Filled 2018-04-01: qty 5

## 2018-04-01 SURGICAL SUPPLY — 58 items
ADH SKN CLS APL DERMABOND .7 (GAUZE/BANDAGES/DRESSINGS) ×1
APL SKNCLS STERI-STRIP NONHPOA (GAUZE/BANDAGES/DRESSINGS)
BENZOIN TINCTURE PRP APPL 2/3 (GAUZE/BANDAGES/DRESSINGS) IMPLANT
BLADE CLIPPER SURG (BLADE) IMPLANT
BLADE SURG 11 STRL SS (BLADE) IMPLANT
BLADE SURG 15 STRL LF DISP TIS (BLADE) ×1 IMPLANT
BLADE SURG 15 STRL SS (BLADE) ×2
CANISTER SUCT 1200ML W/VALVE (MISCELLANEOUS) IMPLANT
CHLORAPREP W/TINT 26ML (MISCELLANEOUS) IMPLANT
CONT SPEC 4OZ CLIKSEAL STRL BL (MISCELLANEOUS) ×8 IMPLANT
COVER BACK TABLE 60X90IN (DRAPES) ×2 IMPLANT
COVER MAYO STAND STRL (DRAPES) ×2 IMPLANT
COVER SURGICAL LIGHT HANDLE (MISCELLANEOUS) ×2 IMPLANT
COVER WAND RF STERILE (DRAPES) IMPLANT
DERMABOND ADVANCED (GAUZE/BANDAGES/DRESSINGS) ×1
DERMABOND ADVANCED .7 DNX12 (GAUZE/BANDAGES/DRESSINGS) ×1 IMPLANT
DRAIN SNY 10 ROU (WOUND CARE) IMPLANT
DRAPE PED LAPAROTOMY (DRAPES) ×2 IMPLANT
DRAPE U-SHAPE 76X120 STRL (DRAPES) ×2 IMPLANT
ELECT COATED BLADE 2.86 ST (ELECTRODE) IMPLANT
ELECT NEEDLE BLADE 2-5/6 (NEEDLE) ×2 IMPLANT
ELECT REM PT RETURN 9FT ADLT (ELECTROSURGICAL) ×2
ELECT REM PT RETURN 9FT PED (ELECTROSURGICAL)
ELECTRODE REM PT RETRN 9FT PED (ELECTROSURGICAL) IMPLANT
ELECTRODE REM PT RTRN 9FT ADLT (ELECTROSURGICAL) ×1 IMPLANT
EVACUATOR SILICONE 100CC (DRAIN) IMPLANT
GAUZE 4X4 16PLY RFD (DISPOSABLE) ×4 IMPLANT
GAUZE SPONGE 2X2 8PLY STRL LF (GAUZE/BANDAGES/DRESSINGS) IMPLANT
GAUZE SPONGE 4X4 12PLY STRL LF (GAUZE/BANDAGES/DRESSINGS) IMPLANT
GAUZE XEROFORM 1X8 LF (GAUZE/BANDAGES/DRESSINGS) IMPLANT
GLOVE BIO SURGEON STRL SZ 6 (GLOVE) ×2 IMPLANT
GOWN STRL REUS W/ TWL LRG LVL3 (GOWN DISPOSABLE) ×2 IMPLANT
GOWN STRL REUS W/TWL LRG LVL3 (GOWN DISPOSABLE) ×4
KIT BASIN OR (CUSTOM PROCEDURE TRAY) ×2 IMPLANT
NEEDLE HYPO 30X.5 LL (NEEDLE) IMPLANT
NEEDLE PRECISIONGLIDE 27X1.5 (NEEDLE) ×2 IMPLANT
NS IRRIG 1000ML POUR BTL (IV SOLUTION) ×2 IMPLANT
PENCIL BUTTON HOLSTER BLD 10FT (ELECTRODE) ×2 IMPLANT
RUBBERBAND STERILE (MISCELLANEOUS) IMPLANT
SHEET MEDIUM DRAPE 40X70 STRL (DRAPES) ×2 IMPLANT
SPONGE GAUZE 2X2 STER 10/PKG (GAUZE/BANDAGES/DRESSINGS)
SPONGE LAP 18X18 X RAY DECT (DISPOSABLE) IMPLANT
STAPLER VISISTAT 35W (STAPLE) ×2 IMPLANT
STRIP CLOSURE SKIN 1/2X4 (GAUZE/BANDAGES/DRESSINGS) IMPLANT
SUCTION FRAZIER HANDLE 10FR (MISCELLANEOUS) ×1
SUCTION TUBE FRAZIER 10FR DISP (MISCELLANEOUS) ×1 IMPLANT
SUT ETHILON 4 0 PS 2 18 (SUTURE) IMPLANT
SUT MNCRL AB 4-0 PS2 18 (SUTURE) ×2 IMPLANT
SUT MON AB 5-0 P3 18 (SUTURE) IMPLANT
SUT PLAIN 5 0 P 3 18 (SUTURE) IMPLANT
SUT PROLENE 5 0 P 3 (SUTURE) ×2 IMPLANT
SUT PROLENE 6 0 P 1 18 (SUTURE) IMPLANT
SUT VICRYL 4-0 PS2 18IN ABS (SUTURE) IMPLANT
SYR BULB 3OZ (MISCELLANEOUS) ×2 IMPLANT
SYR CONTROL 10ML LL (SYRINGE) ×2 IMPLANT
TOWEL OR 17X24 6PK STRL BLUE (TOWEL DISPOSABLE) ×2 IMPLANT
TRAY ENT MC OR (CUSTOM PROCEDURE TRAY) ×2 IMPLANT
TUBE CONNECTING 20X1/4 (TUBING) ×2 IMPLANT

## 2018-04-01 NOTE — Anesthesia Procedure Notes (Signed)
Procedure Name: Intubation Date/Time: 04/01/2018 12:54 PM Performed by: Barrington Ellison, CRNA Pre-anesthesia Checklist: Patient identified, Emergency Drugs available, Suction available and Patient being monitored Patient Re-evaluated:Patient Re-evaluated prior to induction Oxygen Delivery Method: Circle System Utilized Preoxygenation: Pre-oxygenation with 100% oxygen Induction Type: IV induction Ventilation: Mask ventilation without difficulty Laryngoscope Size: Mac and 4 Grade View: Grade I Tube type: Oral Tube size: 7.5 mm Number of attempts: 1 Airway Equipment and Method: Stylet and Oral airway Placement Confirmation: ETT inserted through vocal cords under direct vision,  positive ETCO2 and breath sounds checked- equal and bilateral Secured at: 22 cm Tube secured with: Tape Dental Injury: Teeth and Oropharynx as per pre-operative assessment

## 2018-04-01 NOTE — Transfer of Care (Signed)
Immediate Anesthesia Transfer of Care Note  Patient: FREDIS MALKIEWICZ  Procedure(s) Performed: excision benign lesions left neck and right and left cheek ( 4cm, 2cm, 1cm x3), layered closure neck <10cm, layered closure right cheek 1cm (Bilateral Face)  Patient Location: PACU  Anesthesia Type:General  Level of Consciousness: drowsy and patient cooperative  Airway & Oxygen Therapy: Patient Spontanous Breathing  Post-op Assessment: Report given to RN  Post vital signs: Reviewed and stable  Last Vitals:  Vitals Value Taken Time  BP 129/81 04/01/2018  2:17 PM  Temp    Pulse 80 04/01/2018  2:17 PM  Resp 13 04/01/2018  2:17 PM  SpO2 94 % 04/01/2018  2:17 PM  Vitals shown include unvalidated device data.  Last Pain:  Vitals:   04/01/18 1053  TempSrc:   PainSc: 0-No pain         Complications: No apparent anesthesia complications

## 2018-04-01 NOTE — Op Note (Signed)
Operative Note   DATE OF OPERATION: 11.18.19  LOCATION: [facility]-outpatient  SURGICAL DIVISION: Plastic Surgery  PREOPERATIVE DIAGNOSES:  1. Tubeous sclerosis 2. Right preauricular benign skin lesion 3. Sebaceous cysts bilateral cheeks and neck  POSTOPERATIVE DIAGNOSES:  same  PROCEDURE: 1. Excision benign lesion right preauricular 0.8 cm 2. Excision right cheek cyst 1.5 cm 3. Excision left cheek cyst 1 cm 4. Layered closure cheeks 4.5 cm 5. Excision left neck benign lesion 1 cm 2. Excision left neck benign lesion 2.5 cm 3. Excision left neck benign lesion 3 cm Layered closure neck  8 cm  SURGEON: Irene Limbo MD MBA  ASSISTANT: none  ANESTHESIA:  General.   EBL: < 10 ml  COMPLICATIONS: None immediate.   INDICATIONS FOR PROCEDURE:  The patient, Tyler Alvarez, is a 42 y.o. male born on 12-27-1975, is here for excision multiple benign skin lesions in setting of tuberous sclerosis.   FINDINGS: Right preauricular cm papular lesion likely fibroma. Remainder cysts consistent with underlying tuberous sclerosis.  DESCRIPTION OF PROCEDURE:  The patient's operative sites were marked with the patient in the preoperative area. The patient was taken to the operating room. SCDs were placed and IV antibiotics were given. The patient's operative site was prepped and draped in a sterile fashion. A time out was performed and all information was confirmed to be correct. Local anesthetic infiltrated surrounding lesions. I began over right cheek. Sharp excision of right preauricular lesion completed diameter 0.8 cm. Elliptical incision skin overlying right nasolabial fold mass completed and skin flaps elevated off underlying cystic mass. This was excised from underlying facial musculature, diameter 1.5 cm. Elliptical incision skin overlying left medial cheek mass completed and skin flaps elevated off underlying cystic mass. Mass excised diameter 1 cm. Hemostasis ensured and closure cheek lesions  completed with 4-0 and 5-0 monocryl in dermis, running 5-0 prolene skin closure, total length closure 4.5 cm.  I then directed attention to left neck. Patient has had prior excision neck cysts. One area of concern at prior left neck scar posterior extent. This area was sharply excised diameter 1 cm. Second area posterior to this; elliptical incision skin overlying neck mass completed and skin flaps elevated off underlying cystic mass. Mass excised diameter 2.5 cm. This mass represented multiple cysts tracking posteriorly. Inferior to this additional mass noted. Elliptical incision skin overlying left inferior neck mass completed and skin flaps elevated off underlying cystic mass. Mass excised diameter 3 cm. Neck wound irrigated and closure completed of each incision with 4-0  And 4-0 monocryl in dermis followed by running 5-0 prolene, total length closure 8 cm. Antibiotic ointment applied.  The patient was allowed to wake from anesthesia, extubated and taken to the recovery room in satisfactory condition.   SPECIMENS: 1. Right preauricular lesion 2. Right cheek cyst 3. Left cheek cyst 4. Left necks cysts  DRAINS: none  Irene Limbo, MD [facility] Plastic [facility] Reconstruct[facility] Surgery [phone], pin [phone]

## 2018-04-01 NOTE — Anesthesia Postprocedure Evaluation (Signed)
Anesthesia Post Note  Patient: Tyler Alvarez  Procedure(s) Performed: excision benign lesions left neck and right and left cheek ( 4cm, 2cm, 1cm x3), layered closure neck <10cm, layered closure right cheek 1cm (Bilateral Face)     Patient location during evaluation: PACU Anesthesia Type: General Level of consciousness: awake and alert Pain management: pain level controlled Vital Signs Assessment: post-procedure vital signs reviewed and stable Respiratory status: spontaneous breathing, nonlabored ventilation, respiratory function stable and patient connected to nasal cannula oxygen Cardiovascular status: blood pressure returned to baseline and stable Postop Assessment: no apparent nausea or vomiting Anesthetic complications: no    Last Vitals:  Vitals:   04/01/18 1432 04/01/18 1445  BP: 131/80 132/82  Pulse: 79   Resp: 15   Temp:    SpO2: 93%     Last Pain:  Vitals:   04/01/18 1432  TempSrc:   PainSc: 0-No pain                 Effie Berkshire

## 2018-04-01 NOTE — Interval H&P Note (Signed)
History and Physical Interval Note:  04/01/2018 10:27 AM  Tyler Alvarez  has presented today for surgery, with the diagnosis of left neck cysts, right cheek fibroma, tuberoius sclerosis  The various methods of treatment have been discussed with the patient and family. After consideration of risks, benefits and other options for treatment, the patient has consented to  Procedure(s): excision benign lesions left neck and right cheek ( 4cm, 2cm, 1cm x3), layered closure neck <10cm, layered closure right cheek 1cm (Bilateral) as a surgical intervention .  The patient's history has been reviewed, patient examined, no change in status, stable for surgery.  I have reviewed the patient's chart and labs.  Questions were answered to the patient's satisfaction.     Arnoldo Hooker Madeleyn Schwimmer

## 2018-04-01 NOTE — Anesthesia Preprocedure Evaluation (Addendum)
Anesthesia Evaluation  Patient identified by MRN, date of birth, ID band Patient awake    Reviewed: Allergy & Precautions, NPO status , Patient's Chart, lab work & pertinent test results  Airway Mallampati: III  TM Distance: >3 FB Neck ROM: Full  Mouth opening: Limited Mouth Opening  Dental  (+) Partial Upper, Partial Lower, Poor Dentition, Chipped, Missing, Dental Advisory Given   Pulmonary    breath sounds clear to auscultation       Cardiovascular hypertension, + Valvular Problems/Murmurs  Rhythm:Regular Rate:Normal     Neuro/Psych  Headaches, Seizures -, Well Controlled,  PSYCHIATRIC DISORDERS Anxiety    GI/Hepatic Neg liver ROS, GERD  Medicated,  Endo/Other    Renal/GU CRFRenal disease     Musculoskeletal negative musculoskeletal ROS (+)   Abdominal   Peds  Hematology  (+) Blood dyscrasia, anemia ,   Anesthesia Other Findings 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 seizures well controlled  Reproductive/Obstetrics                          Anesthesia Physical Anesthesia Plan  ASA: III  Anesthesia Plan: General   Post-op Pain Management:    Induction: Intravenous, Cricoid pressure planned and Rapid sequence  PONV Risk Score and Plan: 3 and Ondansetron, Dexamethasone and Midazolam  Airway Management Planned: Oral ETT  Additional Equipment: None  Intra-op Plan:   Post-operative Plan: Extubation in OR  Informed Consent: I have reviewed the patients History and Physical, chart, labs and discussed the procedure including the risks, benefits and alternatives for the proposed anesthesia with the patient or authorized representative who has indicated his/her understanding and acceptance.   Dental advisory given  Plan Discussed with: CRNA  Anesthesia Plan Comments:        Anesthesia Quick Evaluation

## 2018-04-02 ENCOUNTER — Encounter (HOSPITAL_COMMUNITY): Payer: Self-pay | Admitting: Plastic Surgery

## 2018-04-02 ENCOUNTER — Ambulatory Visit (HOSPITAL_COMMUNITY)
Admission: EM | Admit: 2018-04-02 | Discharge: 2018-04-02 | Disposition: A | Discharge status: Home / Self Care | Payer: Medicare Other | Attending: Family Medicine | Admitting: Family Medicine

## 2018-04-02 DIAGNOSIS — L0291 Cutaneous abscess, unspecified: Secondary | ICD-10-CM

## 2018-04-02 DIAGNOSIS — L02415 Cutaneous abscess of right lower limb: Secondary | ICD-10-CM

## 2018-04-02 MED ORDER — DOXYCYCLINE HYCLATE 100 MG PO CAPS: 100.0000 mg | ORAL_CAPSULE | Freq: Two times a day (BID) | ORAL | 0 refills | Status: DC

## 2018-04-02 NOTE — ED Triage Notes (Signed)
Pt here for abscess to right upper leg; pt sts hx of kidney transplant

## 2018-04-02 NOTE — ED Provider Notes (Addendum)
Tyler Alvarez   [id number] 04/02/18 Arrival Time: 2542  ASSESSMENT & PLAN:  1. Abscess   No signs of surrounding skin infection/cellulitis. No evidence of cyst.  Incision and Drainage Procedure Note  Anesthesia: 2% plain lidocaine  Procedure Details  The procedure, risks and complications have been discussed in detail (including, but not limited to pain and bleeding) with the patient.  The skin induration was prepped and draped in the usual fashion. After adequate local anesthesia, I&D with a #11 blade was performed on the upper inner R thigh. Purulent drainage: present; small amount  EBL: minimal  Drains: none  Condition: Tolerated procedure well  Complications: none.  Meds ordered this encounter  Medications  . doxycycline (VIBRAMYCIN) 100 MG capsule    Sig: Take 1 capsule (100 mg total) by mouth 2 (two) times daily.    Dispense:  14 capsule    Refill:  0   Wound care instructions discussed and given in written format. To return in 48 hours for wound check if needed.  Finish all antibiotics. OTC analgesics as needed.  Reviewed expectations re: course of current medical issues. Questions answered. Outlined signs and symptoms indicating need for more acute intervention. Patient verbalized understanding. After Visit Summary given.   SUBJECTIVE:  Tyler Alvarez is a 42 y.o. male who presents with a possible abscess of his right upper inner thigh. Onset gradual, approximately several days ago. "Opened a little yesterday and drained some pus". Not much associated pain other than some soreness that travels down inner thigh. No back pain. No further drainage or bleeding. Afebrile. No OTC treatment. Ambulatory without difficulty. No extremity sensation changes or weakness.  Kidney transplant patient.  ROS: As per HPI.  OBJECTIVE:  General appearance: alert; no distress Abd: soft; non-tender; no inguinal lymphadenopathy Skin: overall warm and dry; 1 cm  induration of his R inner upper thigh; no significant surrounding erythema; minimal tenderness to touch; no active drainage; no varicose veins Ext: no LE edema Neuro: normal bilateral LE sensation Psychological: alert and cooperative; normal mood and affect  Allergies  Allergen Reactions  . Erythromycin Nausea And Vomiting  . Sulfa Antibiotics Itching and Nausea And Vomiting    Past Medical History:  Diagnosis Date  . ADD (attention deficit disorder)   . Anxiety   . Benign skin lesion of neck    left neck cyst  . Chronic kidney disease    s/p transplant 2001  . Family history of adverse reaction to anesthesia    Pt mother stated Father-"I think that he stopped breathing, they called a code but he came through okay"  . GERD (gastroesophageal reflux disease)   . Headache    Migraines  . Heart murmur   . Hypertension   . Immune deficiency disorder ([place])   . Restless leg syndrome   . Seizures ([place])    treated by Dr. Melrose Nakayama, no seizures in years and years  . 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 Wears glasses    Social History   Socioeconomic History  . Marital status: Single    Spouse name: Not on file  . Number of children: Not on file  . Years of education: Not on file  . Highest education level: Not on file  Occupational History  . Not on file  Social Needs  . Financial resource strain: Not on file  . Food insecurity:    Worry: Not on file    Inability: Not on file  . Transportation needs:  Medical: Not on file    Non-medical: Not on file  Tobacco Use  . Smoking status: Never Smoker  . Smokeless tobacco: Never Used  Substance and Sexual Activity  . Alcohol use: No  . Drug use: No  . Sexual activity: Yes  Lifestyle  . Physical activity:    Days per week: Not on file    Minutes per session: Not on file  . Stress: Not on file  Relationships  . Social connections:    Talks on phone: Not on file    Gets together: Not on file    Attends religious service: Not on  file    Active member of club or organization: Not on file    Attends meetings of clubs or organizations: Not on file    Relationship status: Not on file  Other Topics Concern  . Not on file  Social History Narrative   Caffeine one daily.  Monster every other day.  Lives at home with mom.  Education: 8th grade.   Single.  None children.     Family History  Problem Relation Age of Onset  . Hypothyroidism Mother   . Cancer Mother 11       cervical  . Hypertension Mother   . COPD Mother   . COPD Father 72       COPD  . Ulcers Father   . COPD Brother   . Heart disease Brother   . Cancer Other    Past Surgical History:  Procedure Laterality Date  . AV FISTULA PLACEMENT    . KIDNEY TRANSPLANT  2001  . MASS EXCISION Left 12/11/2016   Procedure: EXCISION OF BENIGN LESION OF THE NECK WITH LAYERED CLOSURE;  Surgeon: Irene Limbo, MD;  Location: [facility];  Service: Plastics;  Laterality: Left;  Marland Kitchen MASS EXCISION     neck x2  . MASS EXCISION Bilateral 04/01/2018   Procedure: excision benign lesions left neck and right and left cheek ( 4cm, 2cm, 1cm x3), layered closure neck <10cm, layered closure right cheek 1cm;  Surgeon: Irene Limbo, MD;  Location: [facility];  Service: Plastics;  Laterality: Bilateral;  left cheek lesion  . MULTIPLE TOOTH EXTRACTIONS    . NEPHRECTOMY     both removed in 1999, 3 months apart  . REVISON OF ARTERIOVENOUS FISTULA Left 07/16/2014   Procedure: EXCISION ANEURYSMAL AREA OF LEFT ARTERIOVENOUS FISTULA;  Surgeon: Serafina Mitchell, MD;  Location: [facility];  Service: Vascular;  Laterality: Left;           Vanessa Kick, MD 04/02/18 [place]    Vanessa Kick, MD 04/02/18 1435

## 2018-04-09 NOTE — Unmapped (Signed)
[facility] Pharm[facility] Refill Coordination Note    Specialty Medication(s) to be Shipped:   Transplant: Prograf 1mg  and Prednisone 5mg      Oscar [name], DOB: 06-29-1975  Phone: [phone] (home)     All above HIPAA information was verified with patient's caregiver.  Mother Oscar [name]    Completed refill call assessment today to schedule patient's medication shipment from the [facility] [phone]).       Specialty medication(s) and dose(s) confirmed: Regimen is correct and unchanged.   Changes to medications: Oscar [name] reports no changes reported at this time.  Changes to insurance: No  Questions for the pharmacist: No    The patient will receive a drug information handout for each medication shipped and additional FDA Medication Guides as required.      DISEASE/MEDICATION-SPECIFIC INFORMATION        N/A    ADHERENCE     Medication Adherence    Patient reported X missed doses in the last month:  0  Specialty Medication:  Prograf 1mg    Patient is on additional specialty medications:  No  Patient is on more than two specialty medications:  No  Any gaps in refill history greater than 2 weeks in the last 3 months:  no  Demonstrates understanding of importance of adherence:  yes  Informant:  mother  Reliability of informant:  reliable      Adherence tools used:  patient uses a pill box to manage medications      Support network for adherence:  family member      Confirmed plan for next specialty medication refill:  delivery by pharmacy          Refill Coordination    Has the Patients' Contact Information Changed:  No  Is the Shipping Address Different:  No         MEDICARE PART B DOCUMENTATION     Prednisone 5mg : Patient has 11 days worth of tablets on hand.  Prograf 1mg : Patient has 11 days worth of capsules on hand.    SHIPPING     Shipping address confirmed in Epic.     Delivery Scheduled: Yes, Expected medication delivery date: 04/19/2018 via UPS or courier.     Medication will be delivered via UPS to the home address in [facility] [place].    Oscar [name]   [facility] Specialty Technician

## 2018-04-15 ENCOUNTER — Encounter (HOSPITAL_COMMUNITY): Payer: Self-pay | Admitting: Plastic Surgery

## 2018-04-15 NOTE — Addendum Note (Signed)
Addendum  created 04/15/18 0858 by Effie Berkshire, MD   Intraprocedure Event edited, Intraprocedure Staff edited

## 2018-04-17 DIAGNOSIS — Z940 Kidney transplant status: Secondary | ICD-10-CM | POA: Diagnosis not present

## 2018-04-18 MED FILL — PREDNISONE 5 MG TABLET: 30 days supply | Qty: 30 | Fill #3 | Status: AC

## 2018-04-18 MED FILL — PROGRAF 1 MG CAPSULE: ORAL | 30 days supply | Qty: 120 | Fill #3

## 2018-04-18 MED FILL — PREDNISONE 5 MG TABLET: 30 days supply | Qty: 30 | Fill #3

## 2018-04-18 MED FILL — PROGRAF 1 MG CAPSULE: 30 days supply | Qty: 120 | Fill #3 | Status: AC

## 2018-05-06 ENCOUNTER — Other Ambulatory Visit: Payer: Self-pay | Admitting: Family Medicine

## 2018-05-06 DIAGNOSIS — Q851 Tuberous sclerosis: Secondary | ICD-10-CM

## 2018-05-17 NOTE — Unmapped (Signed)
[facility] Special[facility] Pharmacy Refill and Clinical Coordination Note  Medication(s): Prograf 1mg , prednisone 5mg     [name], DOB: 1976-01-27  Phone: [phone] (home) , Alternate phone contact: N/A  Shipping address: [address]  [facility] [place] [address]  Phone or address changes today?: No  All above HIPAA information verified.  Insurance changes? No    Completed refill and clinical call assessment today to schedule patient's medication shipment from the [facility] [phone]).      MEDICATION RECONCILIATION    Confirmed the medication and dosage are correct and have not changed: Yes, regimen is correct and unchanged.    Were there any changes to your medication(s) in the past month:  No, there are no changes reported at this time.    ADHERENCE    Is this medicine transplant or covered by Medicare Part B? No.    Prednisone 5mg : Patient has 7 days worth of tablets on hand.  Prograf 1mg : Patient has 7 days worth of capsules on hand.    Did you miss any doses in the past 4 weeks? No missed doses reported.  Adherence counseling provided? Not needed     SIDE EFFECT MANAGEMENT    Are you tolerating your medication?:  Marten reports tolerating the medication.  Side effect management discussed: None      Therapy is appropriate and should be continued.    Evidence of clinical benefit: See Epic note from 04/13/2017      FINANCIAL/SHIPPING    Delivery Scheduled: Yes, Expected medication delivery date: 05/24/18     Medication will be delivered via UPS to the home address in [facility].    Additional medications refilled: No additional medications/refills needed at this time.    The patient will receive a drug information handout for each medication shipped and additional FDA Medication Guides as required.      Kalep did not have any additional questions at this time.    Delivery address confirmed in Epic.     We will follow up with patient monthly for standard refill processing and delivery.      Thank you,  Arnold Long   [facility] Specialty Pharmacist

## 2018-05-21 DIAGNOSIS — I10 Essential (primary) hypertension: Secondary | ICD-10-CM | POA: Diagnosis not present

## 2018-05-21 DIAGNOSIS — N2581 Secondary hyperparathyroidism of renal origin: Secondary | ICD-10-CM | POA: Diagnosis not present

## 2018-05-21 DIAGNOSIS — Z940 Kidney transplant status: Secondary | ICD-10-CM | POA: Diagnosis not present

## 2018-05-21 DIAGNOSIS — M109 Gout, unspecified: Secondary | ICD-10-CM | POA: Diagnosis not present

## 2018-05-21 DIAGNOSIS — D899 Disorder involving the immune mechanism, unspecified: Secondary | ICD-10-CM | POA: Diagnosis not present

## 2018-05-22 MED ORDER — OMEPRAZOLE 20 MG CAPSULE,DELAYED RELEASE
ORAL_CAPSULE | 3 refills | 0 days | Status: CP
Start: 2018-05-22 — End: ?

## 2018-05-27 MED FILL — PREDNISONE 5 MG TABLET: 30 days supply | Qty: 30 | Fill #4

## 2018-05-27 MED FILL — PROGRAF 1 MG CAPSULE: ORAL | 30 days supply | Qty: 120 | Fill #4

## 2018-05-27 MED FILL — PREDNISONE 5 MG TABLET: 30 days supply | Qty: 30 | Fill #4 | Status: AC

## 2018-05-27 MED FILL — PROGRAF 1 MG CAPSULE: 30 days supply | Qty: 120 | Fill #4 | Status: AC

## 2018-05-27 NOTE — Unmapped (Signed)
Dennard Nip Luddy called back about the delivery for Prograf and would like the delivery to ship out 01/13 via UPS or [facility] to be delivered 01/14 . We have confirmed the delivery via UPS delivery .

## 2018-05-29 ENCOUNTER — Encounter: Admit: 2018-05-29 | Discharge: 2018-05-29 | Payer: MEDICARE

## 2018-05-29 ENCOUNTER — Ambulatory Visit: Admit: 2018-05-29 | Discharge: 2018-05-29 | Payer: MEDICARE

## 2018-05-29 ENCOUNTER — Encounter: Admit: 2018-05-29 | Discharge: 2018-05-29 | Payer: MEDICARE | Attending: Nephrology | Primary: Nephrology

## 2018-05-29 DIAGNOSIS — Z940 Kidney transplant status: Principal | ICD-10-CM

## 2018-05-29 DIAGNOSIS — D899 Disorder involving the immune mechanism, unspecified: Secondary | ICD-10-CM

## 2018-05-29 DIAGNOSIS — N183 Chronic kidney disease, stage 3 (moderate): Secondary | ICD-10-CM

## 2018-05-29 DIAGNOSIS — Z79899 Other long term (current) drug therapy: Secondary | ICD-10-CM

## 2018-05-29 DIAGNOSIS — R918 Other nonspecific abnormal finding of lung field: Secondary | ICD-10-CM

## 2018-05-29 DIAGNOSIS — R011 Cardiac murmur, unspecified: Secondary | ICD-10-CM | POA: Diagnosis not present

## 2018-05-29 DIAGNOSIS — Z905 Acquired absence of kidney: Secondary | ICD-10-CM | POA: Diagnosis not present

## 2018-05-29 DIAGNOSIS — G43909 Migraine, unspecified, not intractable, without status migrainosus: Secondary | ICD-10-CM | POA: Diagnosis not present

## 2018-05-29 DIAGNOSIS — I10 Essential (primary) hypertension: Secondary | ICD-10-CM | POA: Diagnosis not present

## 2018-05-29 LAB — COMPREHENSIVE METABOLIC PANEL
ALBUMIN: 4.5 g/dL (ref 3.5–5.0)
ALKALINE PHOSPHATASE: 63 U/L (ref 38–126)
ALT (SGPT): 23 U/L (ref <50)
ANION GAP: 13 mmol/L (ref 7–15)
AST (SGOT): 19 U/L (ref 19–55)
BILIRUBIN TOTAL: 0.4 mg/dL (ref 0.0–1.2)
BLOOD UREA NITROGEN: 27 mg/dL — ABNORMAL HIGH (ref 7–21)
BUN / CREAT RATIO: 13
CALCIUM: 10.1 mg/dL (ref 8.5–10.2)
CHLORIDE: 105 mmol/L (ref 98–107)
CO2: 23 mmol/L (ref 22.0–30.0)
EGFR CKD-EPI AA MALE: 44 mL/min/{1.73_m2} — ABNORMAL LOW (ref >=60)
EGFR CKD-EPI NON-AA MALE: 38 mL/min/{1.73_m2} — ABNORMAL LOW (ref >=60)
GLUCOSE RANDOM: 98 mg/dL (ref 70–179)
POTASSIUM: 4.2 mmol/L (ref 3.5–5.0)
PROTEIN TOTAL: 7.9 g/dL (ref 6.5–8.3)
SODIUM: 141 mmol/L (ref 135–145)

## 2018-05-29 LAB — URINALYSIS
BACTERIA: NONE SEEN /HPF
BILIRUBIN UA: NEGATIVE
BLOOD UA: NEGATIVE
GLUCOSE UA: NEGATIVE
KETONES UA: NEGATIVE
LEUKOCYTE ESTERASE UA: NEGATIVE
NITRITE UA: NEGATIVE
PH UA: 6.5 (ref 5.0–9.0)
PROTEIN UA: 100 — AB
SPECIFIC GRAVITY UA: 1.015 (ref 1.003–1.030)
SQUAMOUS EPITHELIAL: <1 /HPF (ref 0–5)
UROBILINOGEN UA: 0.2
WBC UA: 1 /HPF (ref <=2)

## 2018-05-29 LAB — LIPID PANEL
CHOLESTEROL: 172 mg/dL (ref 100–199)
HDL CHOLESTEROL: 54 mg/dL (ref 40–59)
LDL CHOLESTEROL CALCULATED: 75 mg/dL (ref 60–99)
TRIGLYCERIDES: 214 mg/dL — ABNORMAL HIGH (ref 1–149)
VLDL CHOLESTEROL CAL: 42.8 mg/dL (ref 11–50)

## 2018-05-29 LAB — CBC W/ AUTO DIFF
BASOPHILS ABSOLUTE COUNT: 0.1 10*9/L (ref 0.0–0.1)
BASOPHILS RELATIVE PERCENT: 0.7 %
EOSINOPHILS ABSOLUTE COUNT: 1 10*9/L — ABNORMAL HIGH (ref 0.0–0.4)
EOSINOPHILS RELATIVE PERCENT: 7.7 %
HEMATOCRIT: 37.7 % — ABNORMAL LOW (ref 41.0–53.0)
HEMOGLOBIN: 12.6 g/dL — ABNORMAL LOW (ref 13.5–17.5)
LYMPHOCYTES ABSOLUTE COUNT: 2.8 10*9/L (ref 1.5–5.0)
LYMPHOCYTES RELATIVE PERCENT: 21.5 %
MEAN CORPUSCULAR HEMOGLOBIN CONC: 33.3 g/dL (ref 31.0–37.0)
MEAN CORPUSCULAR VOLUME: 91.9 fL (ref 80.0–100.0)
MEAN PLATELET VOLUME: 8.1 fL (ref 7.0–10.0)
MONOCYTES ABSOLUTE COUNT: 0.9 10*9/L — ABNORMAL HIGH (ref 0.2–0.8)
MONOCYTES RELATIVE PERCENT: 6.7 %
NEUTROPHILS ABSOLUTE COUNT: 7.9 10*9/L — ABNORMAL HIGH (ref 2.0–7.5)
NEUTROPHILS RELATIVE PERCENT: 61.8 %
PLATELET COUNT: 243 10*9/L (ref 150–440)
RED BLOOD CELL COUNT: 4.11 10*12/L — ABNORMAL LOW (ref 4.50–5.90)
RED CELL DISTRIBUTION WIDTH: 14.1 % (ref 12.0–15.0)
WBC ADJUSTED: 12.8 10*9/L — ABNORMAL HIGH (ref 4.5–11.0)

## 2018-05-29 LAB — PHOSPHORUS: Phosphate:MCnc:Pt:Ser/Plas:Qn:: 2.7 — ABNORMAL LOW

## 2018-05-29 LAB — ALKALINE PHOSPHATASE: Alkaline phosphatase:CCnc:Pt:Ser/Plas:Qn:: 63

## 2018-05-29 LAB — LACTATE DEHYDROGENASE: Lactate dehydrogenase:CCnc:Pt:Ser/Plas:Qn:: 455

## 2018-05-29 LAB — PROTEIN/CREAT RATIO, URINE: Protein/Creatinine:MRto:Pt:Urine:Qn:: 0.549

## 2018-05-29 LAB — PROTEIN / CREATININE RATIO, URINE
CREATININE, URINE: 176.6 mg/dL
PROTEIN URINE: 96.9 mg/dL

## 2018-05-29 LAB — PARATHYROID HORMONE INTACT: Parathyrin.intact:MCnc:Pt:Ser/Plas:Qn:: 212.1 — ABNORMAL HIGH

## 2018-05-29 LAB — GAMMA GLUTAMYL TRANSFERASE: Gamma glutamyl transferase:CCnc:Pt:Ser/Plas:Qn:: 54

## 2018-05-29 LAB — VLDL CHOLESTEROL CAL: Cholesterol.in VLDL:MCnc:Pt:Ser/Plas:Qn:Calculated: 42.8

## 2018-05-29 LAB — MAGNESIUM: Magnesium:MCnc:Pt:Ser/Plas:Qn:: 1.5 — ABNORMAL LOW

## 2018-05-29 LAB — MEAN CORPUSCULAR VOLUME: Lab: 91.9

## 2018-05-29 LAB — GAMMA GT: GAMMA GLUTAMYL TRANSFERASE: 54 U/L (ref 12–109)

## 2018-05-29 LAB — NITRITE UA: Lab: NEGATIVE

## 2018-05-29 MED ORDER — PROGRAF 1 MG CAPSULE
ORAL_CAPSULE | Freq: Two times a day (BID) | ORAL | 3 refills | 90.00000 days | Status: CP
Start: 2018-05-29 — End: 2019-05-29
  Filled 2018-06-20: qty 360, 90d supply, fill #0

## 2018-05-29 MED ORDER — PREDNISONE 5 MG TABLET
ORAL_TABLET | Freq: Every day | ORAL | 3 refills | 90 days | Status: CP
Start: 2018-05-29 — End: 2019-05-29
  Filled 2018-06-20: qty 90, 90d supply, fill #0

## 2018-05-30 LAB — HLA DS POST TRANSPLANT
ANTI-DONOR HLA-A #1 MFI: 13 MFI
ANTI-DONOR HLA-A #2 MFI: 28 MFI
ANTI-DONOR HLA-B #1 MFI: 121 MFI
ANTI-DONOR HLA-DQB #1 MFI: 15 MFI
ANTI-DONOR HLA-DR #1 MFI: 60 MFI

## 2018-05-30 LAB — VITAMIN D, TOTAL (25OH): Lab: 11.9 — ABNORMAL LOW

## 2018-05-30 LAB — TACROLIMUS, TROUGH: Lab: 5.7

## 2018-05-30 LAB — FSAB CLASS 2 ANTIBODY SPECIFICITY

## 2018-05-30 LAB — DONOR HLA-A ANTIGEN #1

## 2018-05-30 LAB — HLA CLASS 1 ANTIBODY RESULT: Lab: NEGATIVE

## 2018-05-30 LAB — HLA CL2 AB COMMENT: Lab: 0

## 2018-06-03 LAB — EBV VIRAL LOAD RESULT: Lab: NOT DETECTED

## 2018-06-04 LAB — VITAMIN D 1,25-DIHYDROXY: 1,25-Dihydroxyvitamin D:MCnc:Pt:Ser/Plas:Qn:: 32

## 2018-06-10 NOTE — Unmapped (Signed)
[facility] transplant nephrology clinic visit    assessment and plan:  1. s/p kidney transplant 02/22/2000. baseline creatinine 1.6-2 mg/dl. minimal proteinuria. no donor specific hla ab detected. no change in maintenance immunosuppression. tacrolimus lvl 5-7 ng/ml.  2. hypertension. blood pressure goal < 130/80 mmhg.  3. heart murmur +dyspnea with exertion. +referral for echocardiogram.  4. preventive medicine. influenza '19. pcv13 pneumococcal '17. ppsv23 pneumococcal '18. renal ultrasound '20. dermatology appt '18 with annual follow up recommended.    history of present illness:    Oscar [name] is a 43 year old gentleman who is seen in follow up s/p kidney transplant 02/22/2000. +left neck/bilateral facial cyst surgical excision 11/19 without complication. he feels well. no headache or lightheaded. no fevers chills or sweats. no chest pain palpitations orthopnea or shortness of breath. +dyspnea with moderate exertion. no lower ext edema. appetite nl. no abd pain no n/v/d. no urine symptoms. no seizures. no tremor weakness or paresthesias. all other systems reviewed and negative x10 systems.    past medical history:  1. s/p living donor kidney transplant 02/22/2000. tuberous sclerosis. baseline creatinine 1.6-2 mg/dl.  2. hypertension  3. seizure disorder hx  4. migraine  5. 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  ??  past surgical history: bilateral native nephrectomy d/t renal cell carcinoma '99. left arm av fistula '99. kidney transplant '01. 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 left neck cyst excision '18. left neck and bilateral facial cyst excision '19.  ??  allergies: sulfa. erythromycin  ??  medications: tacrolimus 2mg  bid, prednisone 5mg  daily, amlodipine 5mg  daily, aspirin 81mg  daily, omeprazole 20mg  daily, divalproex 500mg  daily, gabapentin 300mg  q.pm, imipramine 50mg  q.pm.    physical exam: t97.9 p96 bp128/78 wt68.5kg bmi 20.5. wd/wn gentleman nl/appropriate affect and mood. nl sclera anicteric. mmm no thrush. neck supple no palpable ln. heart rrr nl s1s2 +iii/vi systolic murmur no s3/s4. lungs clear bilateral. abd soft nt/nd. no lower ext edema. msk no synovitis/tophi. skin no rash +fibroadenomas. neuro alert oriented non focal exam.    labs 05/29/18: wbc12.8 hgb12.6 hct37.7 plts243. na141 k4.2 cl105 bicarb23 bun27 cr2.1 glc98 ca10.1 mg1.5 phos2.7 albumin4.5 liver function panel nl. total cholesterol 172 tg 214 hdl 54 ldl 75. pth 212. 25-vitamin d lvl 11.9. cmv pcr not detected. tacrolimus 12.5hr lvl 5.7 ng/ml. urine protein/cr [id number].    renal transplant ultrasound 05/29/18: renal transplant in left lower quadrant 10cm. +1.9 x 1.7cm cyst unchanged from previous exam '18. trace caliectasis. stable resistive indices 0.6-0.8. no calculus or renal mass.

## 2018-06-14 NOTE — Unmapped (Signed)
[facility] Pharm[facility] Refill Coordination Note    Specialty Medication(s) to be Shipped:   Transplant: Prograf 1mg  and Prednisone 5mg     Other medication(s) to be shipped: none     Oscar [name], DOB: 31-Mar-1976  Phone: [phone] (home)       All above HIPAA information was verified with patient.     Completed refill call assessment today to schedule patient's medication shipment from the [facility] [phone]).       Specialty medication(s) and dose(s) confirmed: Regimen is correct and unchanged.   Changes to medications: Oscar [name] reports no changes reported at this time.  Changes to insurance: No  Questions for the pharmacist: No    The patient will receive a drug information handout for each medication shipped and additional FDA Medication Guides as required.      DISEASE/MEDICATION-SPECIFIC INFORMATION        N/A    ADHERENCE     Medication Adherence    Patient reported X missed doses in the last month:  0  Adherence tools used:  patient uses a pill box to manage medications  Support network for adherence:  family member              MEDICARE PART B DOCUMENTATION     Prednisone 5mg : Patient has 8 days worth of tablets on hand.  Prograf 1mg : Patient has 8 days worth of capsules on hand.    SHIPPING     Shipping address confirmed in Epic.     Delivery Scheduled: Yes, Expected medication delivery date: 06/19/18 via UPS or courier.     Medication will be delivered via UPS to the home address in [place].    Oscar [name]   [facility] Specialty Technician

## 2018-06-18 MED ORDER — ERGOCALCIFEROL (VITAMIN D2) 1,250 MCG (50,000 UNIT) CAPSULE
ORAL_CAPSULE | ORAL | 0 refills | 0 days | Status: CP
Start: 2018-06-18 — End: 2018-09-16

## 2018-06-18 NOTE — Unmapped (Signed)
Vitamin D level reviewed with Dr. Toni Arthurs, ordered vitamin D 50,000 units x 12 weeks, pt's mom verbalized understanding and new RX sent

## 2018-06-18 NOTE — Unmapped (Signed)
Oscar [name] 's Prednisone & Prograf shipment will be delayed due to Refill too soon until 02/05 We have contacted the patient and communicated the delivery change to patient/caregiver We will reschedule the medication for the delivery date that the patient agreed upon. We have confirmed the delivery date as 02/06 .

## 2018-06-20 MED FILL — PREDNISONE 5 MG TABLET: 90 days supply | Qty: 90 | Fill #0 | Status: AC

## 2018-06-20 MED FILL — PROGRAF 1 MG CAPSULE: 90 days supply | Qty: 360 | Fill #0 | Status: AC

## 2018-07-16 DIAGNOSIS — R0609 Other forms of dyspnea: Principal | ICD-10-CM

## 2018-07-16 DIAGNOSIS — Z940 Kidney transplant status: Principal | ICD-10-CM

## 2018-07-17 NOTE — Unmapped (Signed)
I called pt and his mom to let them know that I scheduled his echo for 3/27 at 1045 at the main hospital.  They will let me know if something comes up with that date.

## 2018-08-12 ENCOUNTER — Other Ambulatory Visit: Payer: Self-pay | Admitting: Family Medicine

## 2018-08-12 DIAGNOSIS — Q851 Tuberous sclerosis: Secondary | ICD-10-CM

## 2018-08-30 MED ORDER — IMIPRAMINE 25 MG TABLET
ORAL_TABLET | Freq: Every evening | ORAL | 3 refills | 0 days | Status: CP
Start: 2018-08-30 — End: ?

## 2018-09-03 NOTE — Unmapped (Signed)
[facility] Clinical Assessment & Refill Coordination Note    Oscar [name], DOB: 11-05-1975  Phone: [phone] (home)     All above HIPAA information was verified with patient's family member. Spoke with Mr. Hallam mom [name] today about his medications.    Specialty Medication(s):   Transplant: Prograf 1mg      Current Outpatient Medications   Medication Sig Dispense Refill   ??? amLODIPine (NORVASC) 2.5 MG tablet TAKE 2 TABLETS BY MOUTH EVERY DAY 180 tablet 2   ??? divalproex (DEPAKOTE) 250 MG DR tablet Take 2 tablets (500 mg total) by mouth daily at 0600. This prescription is written specifically for brand name depakote. 180 tablet 3   ??? ergocalciferol (DRISDOL) 1,250 mcg (50,000 unit) capsule Take 1 capsule (50,000 Units total) by mouth once a week. Weekly x 12 weeks 12 capsule 0   ??? gabapentin (NEURONTIN) 300 MG capsule Take 1 capsule (300 mg total) by mouth nightly. 90 capsule 3   ??? imipramine (TOFRANIL) 25 MG tablet Take 2 tablets (50 mg total) by mouth nightly. 180 tablet 3   ??? omeprazole (PRILOSEC) 20 MG capsule TAKE 1 CAPSULE (20 MG TOTAL) BY MOUTH DAILY 90 capsule 3   ??? predniSONE (DELTASONE) 5 MG tablet Take 1 tablet (5 mg total) by mouth daily. 90 tablet 3   ??? PROGRAF 1 mg capsule Take 2 capsules (2 mg total) by mouth two (2) times a day. 360 capsule 3     No current facility-administered medications for this visit.         Changes to medications: Acelin reports no changes at this time.    Allergies   Allergen Reactions   ??? Erythromycin    ??? Erythromycin Base      Other reaction(s): NAUSEA   ??? Sulfa (Sulfonamide Antibiotics)      Other reaction(s): ITCHING   ??? Sulfacetamide Sodium        Changes to allergies: No    SPECIALTY MEDICATION ADHERENCE     Prograf 1 mg: 7 days of medicine on hand   Prednisone 5 mg: 7 days of medicine on hand        Medication Adherence    Patient reported X missed doses in the last month:  0  Specialty Medication:  Prograf 1mg  Patient is on additional specialty medications:  Yes  Additional Specialty Medications:  Prednisone 5mg   Patient Reported Additional Medication X Missed Doses in the Last Month:  0  Adherence tools used:  patient uses a pill box to manage medications  Support network for adherence:  family member          Specialty medication(s) dose(s) confirmed: Regimen is correct and unchanged.     Are there any concerns with adherence? No    Adherence counseling provided? Not needed    CLINICAL MANAGEMENT AND INTERVENTION      Clinical Benefit Assessment:    Do you feel the medicine is effective or helping your condition? No    Clinical Benefit counseling provided? Not needed    Adverse Effects Assessment:    Are you experiencing any side effects? No    Are you experiencing difficulty administering your medicine? No    Quality of Life Assessment:    How many days over the past month did your kidney transplant  keep you from your normal activities? For example, brushing your teeth or getting up in the morning. 0    Have you discussed this with your provider? Not  needed    Therapy Appropriateness:    Is therapy appropriate? Yes, therapy is appropriate and should be continued    DISEASE/MEDICATION-SPECIFIC INFORMATION      N/A    PATIENT SPECIFIC NEEDS     ? Does the patient have any physical, cognitive, or cultural barriers? No    ? Is the patient high risk? No     ? Does the patient require a Care Management Plan? No     ? Does the patient require physician intervention or other additional services (i.e. nutrition, smoking cessation, social work)? No      SHIPPING     Specialty Medication(s) to be Shipped:   Transplant: Prograf 1mg  and Prednisone 5mg     Other medication(s) to be shipped: NONE     Changes to insurance: No    Delivery Scheduled: Yes, Expected medication delivery date: 09/06/2018.     Medication will be delivered via UPS to the confirmed home address in [facility].    The patient will receive a drug information handout for each medication shipped and additional FDA Medication Guides as required.  Verified that patient has previously received a Conservation officer, historic buildings.    Tera Helper   [facility] Specialty Pharmacist

## 2018-09-05 MED FILL — PREDNISONE 5 MG TABLET: ORAL | 90 days supply | Qty: 90 | Fill #1

## 2018-09-05 MED FILL — PREDNISONE 5 MG TABLET: 90 days supply | Qty: 90 | Fill #1 | Status: AC

## 2018-09-05 MED FILL — PROGRAF 1 MG CAPSULE: ORAL | 30 days supply | Qty: 120 | Fill #1

## 2018-09-05 MED FILL — PROGRAF 1 MG CAPSULE: 30 days supply | Qty: 120 | Fill #1 | Status: AC

## 2018-09-24 NOTE — Unmapped (Signed)
[facility] Refill Coordination Note    Specialty Medication(s) to be Shipped:   Transplant: Prograf 1mg     Other medication(s) to be shipped: none     Oscar [name], DOB: 1975/05/25  Phone: [phone] (home)       All above HIPAA information was verified with patient.     Completed refill call assessment today to schedule patient's medication shipment from the [name] [phone]).       Specialty medication(s) and dose(s) confirmed: Regimen is correct and unchanged.   Changes to medications: Oscar [name] reports no changes reported at this time.  Changes to insurance: No  Questions for the pharmacist: No    Confirmed patient received Welcome Packet with first shipment. The patient will receive a drug information handout for each medication shipped and additional FDA Medication Guides as required.       DISEASE/MEDICATION-SPECIFIC INFORMATION        N/A    SPECIALTY MEDICATION ADHERENCE     Medication Adherence    Patient reported X missed doses in the last month:  0  Adherence tools used:  patient uses a pill box to manage medications  Support network for adherence:  family member                Prograf 1mg : 14 days on hand      SHIPPING     Shipping address confirmed in Epic.     Delivery Scheduled: Yes, Expected medication delivery date: 10/02/18.     Medication will be delivered via UPS to the home address in [place].    Oscar [name]   [facility] Specialty Technician

## 2018-10-01 MED FILL — PROGRAF 1 MG CAPSULE: ORAL | 30 days supply | Qty: 120 | Fill #2

## 2018-10-01 MED FILL — PROGRAF 1 MG CAPSULE: 30 days supply | Qty: 120 | Fill #2 | Status: AC

## 2018-10-10 NOTE — Unmapped (Signed)
Called to notify ppt of echo appt scheduled for 7/13 at 10:45am. Spoke to his mother instead, she said she would relay the message to him since he wasn't home at the time. Melanie Crazier  Oct 10, 2018 11:53 AM

## 2018-10-29 NOTE — Unmapped (Signed)
[facility] Refill Coordination Note    Specialty Medication(s) to be Shipped:   Transplant: Prograf 1mg     Other medication(s) to be shipped: none     Oscar [name], DOB: 1976-02-19  Phone: [phone] (home)       All above HIPAA information was verified with patient's caregiver. Mother    Completed refill call assessment today to schedule patient's medication shipment from the [facility] [phone]).       Specialty medication(s) and dose(s) confirmed: Regimen is correct and unchanged.   Changes to medications: Eliga reports no changes at this time.  Changes to insurance: No  Questions for the pharmacist: No    Confirmed patient received Welcome Packet with first shipment. The patient will receive a drug information handout for each medication shipped and additional FDA Medication Guides as required.       DISEASE/MEDICATION-SPECIFIC INFORMATION        N/A    SPECIALTY MEDICATION ADHERENCE     Medication Adherence    Patient reported X missed doses in the last month:  0  Specialty Medication:  Prograf 1mg    Patient is on additional specialty medications:  Yes  Additional Specialty Medications:  Prednisone 5mg   Patient is on more than two specialty medications:  No  Adherence tools used:  patient uses a pill box to manage medications  Support network for adherence:  family member              Prograf 1mg : 10 days on hand      SHIPPING     Shipping address confirmed in Epic.     Delivery Scheduled: Yes, Expected medication delivery date: 11/05/18.     Medication will be delivered via UPS to the home address in [place].    Oscar [name]   [facility] Specialty Technician

## 2018-11-04 DIAGNOSIS — R7309 Other abnormal glucose: Secondary | ICD-10-CM | POA: Diagnosis not present

## 2018-11-04 DIAGNOSIS — N183 Chronic kidney disease, stage 3 (moderate): Secondary | ICD-10-CM | POA: Diagnosis not present

## 2018-11-04 DIAGNOSIS — Z940 Kidney transplant status: Secondary | ICD-10-CM | POA: Diagnosis not present

## 2018-11-04 MED FILL — PROGRAF 1 MG CAPSULE: 30 days supply | Qty: 120 | Fill #3 | Status: AC

## 2018-11-04 MED FILL — PROGRAF 1 MG CAPSULE: ORAL | 30 days supply | Qty: 120 | Fill #3

## 2018-11-08 ENCOUNTER — Other Ambulatory Visit: Payer: Self-pay | Admitting: Family Medicine

## 2018-11-08 DIAGNOSIS — Q851 Tuberous sclerosis: Secondary | ICD-10-CM

## 2018-11-25 ENCOUNTER — Ambulatory Visit: Admit: 2018-11-25 | Discharge: 2018-11-25 | Payer: MEDICARE

## 2018-11-25 DIAGNOSIS — Z940 Kidney transplant status: Principal | ICD-10-CM

## 2018-11-25 DIAGNOSIS — R0609 Other forms of dyspnea: Secondary | ICD-10-CM

## 2018-11-25 DIAGNOSIS — I517 Cardiomegaly: Secondary | ICD-10-CM | POA: Diagnosis not present

## 2018-11-25 DIAGNOSIS — Q248 Other specified congenital malformations of heart: Secondary | ICD-10-CM | POA: Diagnosis not present

## 2018-11-25 DIAGNOSIS — I519 Heart disease, unspecified: Secondary | ICD-10-CM | POA: Diagnosis not present

## 2018-11-27 NOTE — Unmapped (Signed)
[facility] Refill Coordination Note    Specialty Medication(s) to be Shipped:   Transplant: Prograf 1mg  and Prednisone 5mg      [name], DOB: 01-04-1976  Phone: [phone] (home)     All above HIPAA information was verified with patient's caregiver. Mother    Completed refill call assessment today to schedule patient's medication shipment from the [facility] [phone]).       Specialty medication(s) and dose(s) confirmed: Regimen is correct and unchanged.   Changes to medications: Rutherford reports no changes reported at this time.  Changes to insurance: No  Questions for the pharmacist: No    Confirmed patient received Welcome Packet with first shipment. The patient will receive a drug information handout for each medication shipped and additional FDA Medication Guides as required.       DISEASE/MEDICATION-SPECIFIC INFORMATION        N/A    SPECIALTY MEDICATION ADHERENCE     Medication Adherence    Patient reported X missed doses in the last month: 0  Specialty Medication: Prograf 1mg    Patient is on additional specialty medications: Yes  Additional Specialty Medications: Prednisone 5mg   Patient Reported Additional Medication X Missed Doses in the Last Month: 0  Patient is on more than two specialty medications: No  Adherence tools used: patient uses a pill box to manage medications  Support network for adherence: family member        Prograf 1 mg: 12 days of medicine on hand   Prednisone 5 mg: 12 days of medicine on hand     SHIPPING     Shipping address confirmed in Epic.     Delivery Scheduled: Yes, Expected medication delivery date: 12/10/2018.     Medication will be delivered via UPS to the home address in [place].    Beula Joyner P Allena Katz   [facility] Specialty Technician

## 2018-12-05 NOTE — Unmapped (Signed)
I called paient and his mom to let him know the results of the echo.  I explained to them that the lower part of his heart does not squeeze as well as it used to and we would like to change his BP medications to help with that.  I asked if they would like to have a video/phone visit or come to the clinic and they would rather come to the clinic to discuss changing BP meds as well as the echo results.  He had labs drawn locally in June and I called to get those results sent to [place] and asked the clinic to schedule him an appt.

## 2018-12-06 LAB — CBC W/ DIFFERENTIAL
BASOPHILS ABSOLUTE COUNT: 62 {cells}/uL
BASOPHILS RELATIVE PERCENT: 0.7 %
EOSINOPHILS ABSOLUTE COUNT: 231 {cells}/uL
EOSINOPHILS RELATIVE PERCENT: 2.6 %
HEMATOCRIT: 36.1 % — ABNORMAL LOW
HEMOGLOBIN: 12.4 g/dL — ABNORMAL LOW
LYMPHOCYTES ABSOLUTE COUNT: 1531 {cells}/uL
LYMPHOCYTES RELATIVE PERCENT: 17.2 %
MEAN CORPUSCULAR HEMOGLOBIN: 30.5 pg
MEAN CORPUSCULAR VOLUME: 88.9 fL
MEAN PLATELET VOLUME: 10 fL
MONOCYTES RELATIVE PERCENT: 5.4 %
NEUTROPHILS ABSOLUTE COUNT: 6595 {cells}/uL
NEUTROPHILS RELATIVE PERCENT: 74.1 %
PLATELET COUNT: 218 10*9/L
RED BLOOD CELL COUNT: 4.06 10*12/L — ABNORMAL LOW
RED CELL DISTRIBUTION WIDTH: 13 %
WBC ADJUSTED: 8.9 10*9/L

## 2018-12-06 LAB — BASIC METABOLIC PANEL
BLOOD UREA NITROGEN: 30 mg/dL — ABNORMAL HIGH
CALCIUM: 10.6 mg/dL — ABNORMAL HIGH
CHLORIDE: 103 mmol/L
CREATININE: 2.2 mg/dL — ABNORMAL HIGH
EGFR CKD-EPI NON-AA MALE: 35 mL/min/{1.73_m2} — ABNORMAL LOW
GLUCOSE RANDOM: 111 mg/dL — ABNORMAL HIGH
POTASSIUM: 4.9 mmol/L

## 2018-12-06 LAB — LIPID PANEL
CHOLESTEROL/HDL RATIO SCREEN: 3
LDL CHOLESTEROL CALCULATED: 78 mg/dL
NON-HDL CHOLESTEROL: 103 mg/dL
TRIGLYCERIDES: 149 mg/dL

## 2018-12-06 LAB — HEPATIC FUNCTION PANEL
ALKALINE PHOSPHATASE: 52 U/L
AST (SGOT): 15 U/L
BILIRUBIN TOTAL: 0.4 mg/dL

## 2018-12-06 LAB — PROTEIN/CREAT RATIO, URINE: Lab: 0.434 — ABNORMAL HIGH

## 2018-12-06 LAB — PARATHYROID HORMONE INTACT: Lab: 77 — ABNORMAL HIGH

## 2018-12-06 LAB — EGFR CKD-EPI AA MALE: Lab: 0

## 2018-12-06 LAB — MONOCYTES RELATIVE PERCENT: Lab: 5.4

## 2018-12-06 LAB — ALKALINE PHOSPHATASE: Lab: 52

## 2018-12-06 LAB — ALBUMIN: Lab: 4.2

## 2018-12-06 LAB — HEMOGLOBIN A1C: Lab: 5.3

## 2018-12-06 LAB — TACROLIMUS, TROUGH: Lab: 9.7

## 2018-12-06 LAB — TRIGLYCERIDES: Lab: 149

## 2018-12-06 LAB — PROTEIN / CREATININE RATIO, URINE: CREATININE, URINE: 152 mg/dL

## 2018-12-09 MED FILL — PREDNISONE 5 MG TABLET: 90 days supply | Qty: 90 | Fill #2 | Status: AC

## 2018-12-09 MED FILL — PREDNISONE 5 MG TABLET: ORAL | 90 days supply | Qty: 90 | Fill #2

## 2018-12-09 MED FILL — PROGRAF 1 MG CAPSULE: ORAL | 30 days supply | Qty: 120 | Fill #4

## 2018-12-09 MED FILL — PROGRAF 1 MG CAPSULE: 30 days supply | Qty: 120 | Fill #4 | Status: AC

## 2018-12-24 ENCOUNTER — Other Ambulatory Visit: Payer: Self-pay | Admitting: *Deleted

## 2018-12-24 DIAGNOSIS — Q851 Tuberous sclerosis: Secondary | ICD-10-CM

## 2018-12-24 MED ORDER — GABAPENTIN 300 MG PO CAPS: ORAL_CAPSULE | ORAL | 3 refills | Status: DC

## 2018-12-24 MED ORDER — DIVALPROEX SODIUM 250 MG PO DR TAB: 250.0000 mg | DELAYED_RELEASE_TABLET | Freq: Two times a day (BID) | ORAL | 3 refills | Status: DC

## 2018-12-27 ENCOUNTER — Encounter: Admit: 2018-12-27 | Discharge: 2018-12-27 | Payer: MEDICARE

## 2018-12-27 ENCOUNTER — Encounter: Admit: 2018-12-27 | Discharge: 2018-12-27 | Payer: MEDICARE | Attending: Nephrology | Primary: Nephrology

## 2018-12-27 DIAGNOSIS — D899 Disorder involving the immune mechanism, unspecified: Secondary | ICD-10-CM

## 2018-12-27 DIAGNOSIS — Z940 Kidney transplant status: Secondary | ICD-10-CM

## 2018-12-27 DIAGNOSIS — N183 Chronic kidney disease, stage 3 (moderate): Secondary | ICD-10-CM

## 2018-12-27 DIAGNOSIS — Z79899 Other long term (current) drug therapy: Secondary | ICD-10-CM

## 2018-12-27 DIAGNOSIS — R809 Proteinuria, unspecified: Secondary | ICD-10-CM | POA: Diagnosis not present

## 2018-12-27 DIAGNOSIS — Z86718 Personal history of other venous thrombosis and embolism: Secondary | ICD-10-CM | POA: Diagnosis not present

## 2018-12-27 DIAGNOSIS — I77810 Thoracic aortic ectasia: Secondary | ICD-10-CM | POA: Diagnosis not present

## 2018-12-27 DIAGNOSIS — Z905 Acquired absence of kidney: Secondary | ICD-10-CM | POA: Diagnosis not present

## 2018-12-27 DIAGNOSIS — I10 Essential (primary) hypertension: Secondary | ICD-10-CM | POA: Diagnosis not present

## 2018-12-27 DIAGNOSIS — G43909 Migraine, unspecified, not intractable, without status migrainosus: Secondary | ICD-10-CM | POA: Diagnosis not present

## 2018-12-27 LAB — COMPREHENSIVE METABOLIC PANEL
ALBUMIN: 4 g/dL (ref 3.5–5.0)
ALKALINE PHOSPHATASE: 65 U/L (ref 38–126)
ALT (SGPT): 17 U/L (ref <50)
ANION GAP: 12 mmol/L (ref 7–15)
AST (SGOT): 17 U/L — ABNORMAL LOW (ref 19–55)
BILIRUBIN TOTAL: 0.2 mg/dL (ref 0.0–1.2)
BLOOD UREA NITROGEN: 26 mg/dL — ABNORMAL HIGH (ref 7–21)
BUN / CREAT RATIO: 10
CHLORIDE: 102 mmol/L (ref 98–107)
CO2: 26 mmol/L (ref 22.0–30.0)
EGFR CKD-EPI AA MALE: 35 mL/min/{1.73_m2} — ABNORMAL LOW (ref >=60)
EGFR CKD-EPI NON-AA MALE: 30 mL/min/{1.73_m2} — ABNORMAL LOW (ref >=60)
GLUCOSE RANDOM: 81 mg/dL (ref 70–179)
POTASSIUM: 3.9 mmol/L (ref 3.5–5.0)
PROTEIN TOTAL: 7.3 g/dL (ref 6.5–8.3)
SODIUM: 140 mmol/L (ref 135–145)

## 2018-12-27 LAB — TACROLIMUS, TROUGH: Lab: 6.8

## 2018-12-27 LAB — PROTEIN/CREAT RATIO, URINE: Protein/Creatinine:MRto:Pt:Urine:Qn:: 0.298

## 2018-12-27 LAB — URINALYSIS
BACTERIA: NONE SEEN /HPF
BILIRUBIN UA: NEGATIVE
BLOOD UA: NEGATIVE
GLUCOSE UA: NEGATIVE
HYALINE CASTS: 4 /LPF — ABNORMAL HIGH (ref 0–1)
KETONES UA: NEGATIVE
PH UA: 5 (ref 5.0–9.0)
PROTEIN UA: 100 — AB
RBC UA: 1 /HPF (ref <=3)
SPECIFIC GRAVITY UA: 1.023 (ref 1.003–1.030)
SQUAMOUS EPITHELIAL: <1 /HPF (ref 0–5)
UROBILINOGEN UA: 2 — AB
WBC UA: 1 /HPF (ref <=2)

## 2018-12-27 LAB — RENAL FUNCTION PANEL
ALBUMIN: 4 g/dL (ref 3.5–5.0)
ANION GAP: 12 mmol/L (ref 7–15)
BLOOD UREA NITROGEN: 26 mg/dL — ABNORMAL HIGH (ref 7–21)
BUN / CREAT RATIO: 10
CALCIUM: 9.9 mg/dL (ref 8.5–10.2)
CHLORIDE: 102 mmol/L (ref 98–107)
CO2: 26 mmol/L (ref 22.0–30.0)
CREATININE: 2.49 mg/dL — ABNORMAL HIGH (ref 0.70–1.30)
EGFR CKD-EPI NON-AA MALE: 30 mL/min/{1.73_m2} — ABNORMAL LOW (ref >=60)
GLUCOSE RANDOM: 81 mg/dL (ref 70–179)
PHOSPHORUS: 4 mg/dL (ref 2.9–4.7)
SODIUM: 140 mmol/L (ref 135–145)

## 2018-12-27 LAB — CHLORIDE: Chloride:SCnc:Pt:Ser/Plas:Qn:: 102

## 2018-12-27 LAB — CBC W/ AUTO DIFF
BASOPHILS ABSOLUTE COUNT: 0.1 10*9/L (ref 0.0–0.1)
BASOPHILS RELATIVE PERCENT: 0.8 %
EOSINOPHILS ABSOLUTE COUNT: 0.7 10*9/L — ABNORMAL HIGH (ref 0.0–0.4)
EOSINOPHILS RELATIVE PERCENT: 6.7 %
HEMATOCRIT: 38 % — ABNORMAL LOW (ref 41.0–53.0)
HEMOGLOBIN: 12.5 g/dL — ABNORMAL LOW (ref 13.5–17.5)
LYMPHOCYTES ABSOLUTE COUNT: 2.8 10*9/L (ref 1.5–5.0)
MEAN CORPUSCULAR HEMOGLOBIN: 30 pg (ref 26.0–34.0)
MEAN CORPUSCULAR VOLUME: 91.4 fL (ref 80.0–100.0)
MEAN PLATELET VOLUME: 8.7 fL (ref 7.0–10.0)
MONOCYTES ABSOLUTE COUNT: 0.6 10*9/L (ref 0.2–0.8)
MONOCYTES RELATIVE PERCENT: 5.9 %
NEUTROPHILS ABSOLUTE COUNT: 6.3 10*9/L (ref 2.0–7.5)
NEUTROPHILS RELATIVE PERCENT: 59.2 %
PLATELET COUNT: 243 10*9/L (ref 150–440)
RED BLOOD CELL COUNT: 4.16 10*12/L — ABNORMAL LOW (ref 4.50–5.90)
RED CELL DISTRIBUTION WIDTH: 14.3 % (ref 12.0–15.0)
WBC ADJUSTED: 10.7 10*9/L (ref 4.5–11.0)

## 2018-12-27 LAB — PHOSPHORUS: Phosphate:MCnc:Pt:Ser/Plas:Qn:: 4

## 2018-12-27 LAB — PROTEIN / CREATININE RATIO, URINE
CREATININE, URINE: 349.3 mg/dL
PROTEIN URINE: 104 mg/dL

## 2018-12-27 LAB — ANION GAP: Anion gap 3:SCnc:Pt:Ser/Plas:Qn:: 12

## 2018-12-27 LAB — MEAN CORPUSCULAR VOLUME: Lab: 91.4

## 2018-12-27 LAB — MAGNESIUM: Magnesium:MCnc:Pt:Ser/Plas:Qn:: 1.6

## 2018-12-27 LAB — PROTEIN UA: Protein:MCnc:Pt:Urine:Qn:Test strip: 100 — AB

## 2018-12-27 MED ORDER — LOSARTAN 25 MG TABLET
ORAL_TABLET | Freq: Two times a day (BID) | ORAL | 11 refills | 30.00000 days | Status: CP
Start: 2018-12-27 — End: 2019-12-27

## 2018-12-28 NOTE — Unmapped (Signed)
[facility] transplant nephrology clinic visit    assessment and plan  1. s/p kidney transplant 02/22/2000. baseline creatinine +/-2 mg/dl. minimal proteinuria. no donor specific hla ab detected.   2. immunosuppression. no change in maintenance immunosuppression. tacrolimus lvl 5-7 ng/ml.  3. hypertension. +losartan 25mg  bid in place of amlodipine vs left ventricular hypertrophy. blood pressure goal < 130/80 mmhg.  4. thoracic aortic dilatation. +[place] cardiothoracic surgery referral, non contrast chest/abd/pelvis ct  evaluation for aneurysm.  5. preventive medicine. influenza '19. pcv13 pneumococcal '17. ppsv23 pneumococcal '18. renal ultrasound '20. dermatology appt '18 with annual follow up recommended.    history of present illness    Oscar [name] is a 43 year old gentleman seen in follow up s/p kidney transplant 02/22/2000. no acute concerns. no fever chills or sweats. no headache or lightheaded. no chest pain cough or shortness of breath. no lower extremity edema. no abdominal pain no n/v/d. no dysuria hematuria or difficulty voiding. all other systems reviewed and negative x10 systems.    past medical history:  1. s/p living donor kidney transplant 02/22/2000. tuberous sclerosis. baseline creatinine +/-2 mg/dl.  2. hypertension  3. echocardiogram 07/20: +mod-severe lvh. lvef > 70%. +diastolic dysfunction. +ascending aorta dilatation.  4. seizure disorder hx  5. migraine  6. 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  ??  past surgical history: bilateral native nephrectomy d/t renal cell carcinoma '99. left arm av fistula '99. kidney transplant '01. 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 left neck cyst excision '18. left neck and bilateral facial cyst excision '19.  ??  allergies: sulfa. erythromycin  ??  medications: tacrolimus 2mg  bid, prednisone 5mg  daily, amlodipine 5mg  daily, aspirin 81mg  daily, omeprazole 20mg  daily, divalproex 500mg  daily, gabapentin 300mg  q.pm, imipramine 50mg  q.pm.    physical exam: t97.6 p92 bp132/88 wt70.1kg bmi 21. wd/wn gentleman nl/appropriate affect and mood. nl sclera anicteric. mmm no thrush. neck supple no palpable ln. heart rrr nl s1s2 +iii/vi systolic murmur no s3/s4. lungs clear bilateral. abd soft nt/nd. no lower ext edema. msk no synovitis/tophi. skin no rash +fibroadenomas. neuro alert oriented non focal exam.    labs 12/27/18: wbc10.7 hgb12.5 hct38 plts243. na140 k3.9 cl102 bicarb26 bun26 cr2.49 glc81 ca9.9 mg1.6 phos4.0 albumin4.0 liver function panel nl. 25-vitamin d 23.9. cmv pcr not detected. tacrolimus 11.5hr lvl 6.8 ng/ml. urine protein/cr 0.298

## 2018-12-29 LAB — VITAMIN D, TOTAL (25OH): Lab: 23.9

## 2018-12-31 LAB — CMV COMMENT: Lab: 0

## 2018-12-31 LAB — CMV DNA, QUANTITATIVE, PCR

## 2018-12-31 NOTE — Unmapped (Signed)
Patient does not need a refill of specialty medication at this time. Moving specialty refill reminder call to appropriate date and removed call attempt data.  Spoke with patient's mother Oscar [name] and she states he still has a little over 2 weeks worth of medication on hand and does not need refills at this time.  She confirmed patient has not missed any doses or started any new medications.

## 2019-01-01 LAB — BK VIRUS QUANTITATIVE PCR, BLOOD: BK BLOOD RESULT: NOT DETECTED

## 2019-01-01 LAB — BK BLOOD RESULT: Lab: NOT DETECTED

## 2019-01-08 ENCOUNTER — Ambulatory Visit: Admit: 2019-01-08 | Discharge: 2019-01-09 | Payer: MEDICARE | Attending: Surgical | Primary: Surgical

## 2019-01-08 ENCOUNTER — Encounter: Admit: 2019-01-08 | Discharge: 2019-01-09 | Payer: MEDICARE

## 2019-01-08 DIAGNOSIS — I712 Thoracic aortic aneurysm, without rupture: Principal | ICD-10-CM

## 2019-01-08 DIAGNOSIS — R911 Solitary pulmonary nodule: Secondary | ICD-10-CM | POA: Diagnosis not present

## 2019-01-08 DIAGNOSIS — K5641 Fecal impaction: Secondary | ICD-10-CM | POA: Diagnosis not present

## 2019-01-08 DIAGNOSIS — R011 Cardiac murmur, unspecified: Secondary | ICD-10-CM | POA: Diagnosis not present

## 2019-01-08 DIAGNOSIS — R918 Other nonspecific abnormal finding of lung field: Secondary | ICD-10-CM | POA: Diagnosis not present

## 2019-01-08 DIAGNOSIS — Z940 Kidney transplant status: Secondary | ICD-10-CM | POA: Diagnosis not present

## 2019-01-08 DIAGNOSIS — I10 Essential (primary) hypertension: Secondary | ICD-10-CM | POA: Diagnosis not present

## 2019-01-08 NOTE — Unmapped (Signed)
Addended by: Lorel Monaco on: 01/08/2019 04:53 PM     Modules accepted: Orders

## 2019-01-08 NOTE — Unmapped (Signed)
Cardiac Surgery Consult Note    Requesting Attending Physician : Dr. Toni Arthurs      Consulting Physician: Dr. Cain Sieve    Reason for Consult:  Ascending aortic aneurysm     History of Present Illness:     Oscar [name] is a 43 y.o. y/o male with pmhx of s/p kidney transplant 02/22/2000 due to tuberous sclerosis (Tacrolimus), epilepsy, and HTN, who is being seen for aortic aneurysm, which was accidentally found on ECHO for heart murmur. In addition, it revealed moderate to severe Left ventricular hypertrophy and non valvular desease. The non contrast CT measured aorta about 3.5 cm.   Today, patient denies any chest pain, SOB, dizziness, fatigue, syncope episodes, PND. Patient has strong history of aneurysm in his family.      Allergies:  Erythromycin, Erythromycin base, Sulfa (sulfonamide antibiotics), and Sulfacetamide sodium    Scheduled Meds:  Current Outpatient Medications   Medication Sig Dispense Refill   ??? divalproex (DEPAKOTE) 250 MG DR tablet Take 2 tablets (500 mg total) by mouth daily at 0600. This prescription is written specifically for brand name depakote. 180 tablet 3   ??? gabapentin (NEURONTIN) 300 MG capsule Take 1 capsule (300 mg total) by mouth nightly. 90 capsule 3   ??? imipramine (TOFRANIL) 25 MG tablet Take 2 tablets (50 mg total) by mouth nightly. 180 tablet 3   ??? losartan (COZAAR) 25 MG tablet Take 1 tablet (25 mg total) by mouth Two (2) times a day. 60 tablet 11   ??? omeprazole (PRILOSEC) 20 MG capsule TAKE 1 CAPSULE (20 MG TOTAL) BY MOUTH DAILY 90 capsule 3   ??? predniSONE (DELTASONE) 5 MG tablet Take 1 tablet (5 mg total) by mouth daily. 90 tablet 3   ??? PROGRAF 1 mg capsule Take 2 capsules (2 mg total) by mouth two (2) times a day. 360 capsule 3     No current facility-administered medications for this visit.        Medical History:  Past Medical History:   Diagnosis Date   ??? Epilepsy (CMS-HCC)    ??? Esophageal reflux    ??? Kidney transplant 02/22/2000 12/10/2012    Due to [facility] in context of tuberous sclerosis     ??? RLS (restless legs syndrome)    ??? Seizure (CMS-HCC)    ??? 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CMS-HCC)        Surgical History:  Past Surgical History:   Procedure Laterality Date   ??? COMBINED KIDNEY-PANCREAS TRANSPLANT         Social History:  Tobacco use:   Social History     Tobacco Use   Smoking Status Never Smoker   Smokeless Tobacco Never Used     Alcohol use:   Social History     Substance and Sexual Activity   Alcohol Use No   ??? Alcohol/week: 0.0 standard drinks     Illicit Drug use:   Social History     Substance and Sexual Activity   Drug Use No     Living situation: the patient lives with their family.    Family History:  The patient's family history includes COPD in his father; Cancer in his mother; Migraines in his mother; Thyroid disease in his mother..    Review of Systems: A 12 system review of systems was negative except as noted in HPI.    Physical Exam:  Vitals:    01/08/19 1332   BP: 148/89   Pulse: 109   Temp: 36.5 ??C (97.7 ??F)  SpO2: 96%     General: alert and oriented, resting comfortably in NAD  HEENT: normocephalic, atraumatic. sclera anicteric, MMM  Neck: Supple, No JVD. No thyromegaly.   Pulmonary: non-labored breathing, lungs CTAB, No wheezes, rales or rhonchi.   CV: regular rate and rhythm. Normal S1, S2. II/VI systolic murmur, no rubs or gallops. 2+ DP and radial pulses bilaterally. No LE edema  Abdomen: soft, non-tender, non-distended. positive bowel sounds throughout abdomen. no rebound or guarding present.  Neurologic: A&O x 3, answering questions appropriately. strength equal in upper & lower extremities bilaterally. sensation intact throughout. no facial droop noted.  Musculoskeletal: extremities warm and well perfused.   Skin: warm and dry. No rashes.      Diagnostic Studies:  Data Review:  All lab results last 24 hours:  No results found for this or any previous visit (from the past 24 hour(s)).  ECG: no prior ECG     Imaging: Radiology studies were personally reviewed   CT scan wo contrast:  Report is pending     ECHO on 11/25/2018:  ?? Left ventricular hypertrophy - moderate to severe  ?? Hyperdynamic left ventricular systolic function, ejection fraction > 70%  ?? Diastolic dysfunction - grade I (normal filling pressures)  ?? Dilated ascending aorta  ?? Normal right ventricular systolic function       Assessment/Plan:   Oscar [name] is a very pleasant 43 y.o. y/o male with past medical history of kidney transplant, epilepsy, HTN that is being seen in evaluation for ascending aortic aneurysm.     Based on current symptoms and diagnostic findings, annual surveillance with CT scan without contrast is recommended. The patient will call with any questions or concerns. Patient will come in 12 months. Dr. Sindy Guadeloupe was present for the clinic visit and endorses the plan of care.

## 2019-01-14 NOTE — Unmapped (Signed)
[facility] Refill Coordination Note    Specialty Medication(s) to be Shipped:   Transplant: Prograf 1mg     Other medication(s) to be shipped: none     [name], DOB: 1975/09/05  Phone: [phone] (home)       All above HIPAA information was verified with patient.     Completed refill call assessment today to schedule patient's medication shipment from the [facility] [phone]).       Specialty medication(s) and dose(s) confirmed: Regimen is correct and unchanged.   Changes to medications: Namon reports no changes at this time.  Changes to insurance: No  Questions for the pharmacist: No    Confirmed patient received Welcome Packet with first shipment. The patient will receive a drug information handout for each medication shipped and additional FDA Medication Guides as required.       DISEASE/MEDICATION-SPECIFIC INFORMATION        N/A    SPECIALTY MEDICATION ADHERENCE     Medication Adherence    Patient reported X missed doses in the last month: 0  Adherence tools used: patient uses a pill box to manage medications  Support network for adherence: family member              Prograf 1mg : 10 days on hand      SHIPPING     Shipping address confirmed in Epic.     Delivery Scheduled: Yes, Expected medication delivery date: 01/22/19.     Medication will be delivered via UPS to the home address in [place].    [place] A Dyann Goodspeed   [facility] Shared [facility] Specialty Technician

## 2019-01-20 MED FILL — PROGRAF 1 MG CAPSULE: 30 days supply | Qty: 120 | Fill #5 | Status: AC

## 2019-01-20 MED FILL — PROGRAF 1 MG CAPSULE: ORAL | 30 days supply | Qty: 120 | Fill #5

## 2019-01-28 DIAGNOSIS — N2581 Secondary hyperparathyroidism of renal origin: Secondary | ICD-10-CM | POA: Diagnosis not present

## 2019-01-28 DIAGNOSIS — Z23 Encounter for immunization: Secondary | ICD-10-CM | POA: Diagnosis not present

## 2019-01-28 DIAGNOSIS — E559 Vitamin D deficiency, unspecified: Secondary | ICD-10-CM | POA: Diagnosis not present

## 2019-01-28 DIAGNOSIS — Z940 Kidney transplant status: Secondary | ICD-10-CM | POA: Diagnosis not present

## 2019-01-28 DIAGNOSIS — I10 Essential (primary) hypertension: Secondary | ICD-10-CM | POA: Diagnosis not present

## 2019-02-05 DIAGNOSIS — R7309 Other abnormal glucose: Secondary | ICD-10-CM | POA: Diagnosis not present

## 2019-02-05 DIAGNOSIS — N183 Chronic kidney disease, stage 3 (moderate): Secondary | ICD-10-CM | POA: Diagnosis not present

## 2019-02-05 DIAGNOSIS — Z940 Kidney transplant status: Secondary | ICD-10-CM | POA: Diagnosis not present

## 2019-02-10 NOTE — Unmapped (Signed)
[facility] Clinical Assessment & Refill Coordination Note    Oscar [name], DOB: [birth year]  Phone: [phone] (home)     All above HIPAA information was verified with patient's family member.     Specialty Medication(s):   Transplant: Prograf 1mg  and Prednisone 5mg      Current Outpatient Medications   Medication Sig Dispense Refill   ??? divalproex (DEPAKOTE) 250 MG DR tablet Take 2 tablets (500 mg total) by mouth daily at 0600. This prescription is written specifically for brand name depakote. 180 tablet 3   ??? gabapentin (NEURONTIN) 300 MG capsule Take 1 capsule (300 mg total) by mouth nightly. 90 capsule 3   ??? imipramine (TOFRANIL) 25 MG tablet Take 2 tablets (50 mg total) by mouth nightly. 180 tablet 3   ??? losartan (COZAAR) 25 MG tablet Take 1 tablet (25 mg total) by mouth Two (2) times a day. 60 tablet 11   ??? omeprazole (PRILOSEC) 20 MG capsule TAKE 1 CAPSULE (20 MG TOTAL) BY MOUTH DAILY 90 capsule 3   ??? predniSONE (DELTASONE) 5 MG tablet Take 1 tablet (5 mg total) by mouth daily. 90 tablet 3   ??? PROGRAF 1 mg capsule Take 2 capsules (2 mg total) by mouth two (2) times a day. 360 capsule 3     No current facility-administered medications for this visit.         Changes to medications: Oscar [name] reports no changes at this time.    Allergies   Allergen Reactions   ??? Erythromycin    ??? Erythromycin Base      Other reaction(s): NAUSEA   ??? Sulfa (Sulfonamide Antibiotics)      Other reaction(s): ITCHING   ??? Sulfacetamide Sodium        Changes to allergies: No    SPECIALTY MEDICATION ADHERENCE     Prograf 1 mg: 15 days of medicine on hand   Prednisone 5 mg: 30 days of medicine on hand       Medication Adherence    Patient reported X missed doses in the last month: 0  Specialty Medication: Prograf 1mg   Patient is on additional specialty medications: Yes  Additional Specialty Medications: Prednisone 5mg   Patient Reported Additional Medication X Missed Doses in the Last Month: 0 Adherence tools used: patient uses a pill box to manage medications  Support network for adherence: family member          Specialty medication(s) dose(s) confirmed: Regimen is correct and unchanged.     Are there any concerns with adherence? No    Adherence counseling provided? Not needed    CLINICAL MANAGEMENT AND INTERVENTION      Clinical Benefit Assessment:    Do you feel the medicine is effective or helping your condition? Yes    Clinical Benefit counseling provided? Not needed    Adverse Effects Assessment:    Are you experiencing any side effects? No    Are you experiencing difficulty administering your medicine? No    Quality of Life Assessment:    How many days over the past month did your kidney transplant  keep you from your normal activities? For example, brushing your teeth or getting up in the morning. 0    Have you discussed this with your provider? Not needed    Therapy Appropriateness:    Is therapy appropriate? Pharmacist will consult provider    DISEASE/MEDICATION-SPECIFIC INFORMATION      N/A    PATIENT SPECIFIC NEEDS     ?  Does the patient have any physical, cognitive, or cultural barriers? No    ? Is the patient high risk? No     ? Does the patient require a Care Management Plan? No     ? Does the patient require physician intervention or other additional services (i.e. nutrition, smoking cessation, social work)? No      SHIPPING     Specialty Medication(s) to be Shipped:   Transplant: Patient denied needing any specialty medication at this time and would like a call back in 10 days.    Other medication(s) to be shipped: none     Changes to insurance: No    Delivery Scheduled: Patient declined refill at this time due to Has more than 2 weeks of medicine on hand..     Medication will be delivered via UPS to the confirmed home address in [facility].    The patient will receive a drug information handout for each medication shipped and additional FDA Medication Guides as required.  Verified that patient has previously received a Conservation officer, historic buildings.    All of the patient's questions and concerns have been addressed.    Tera Helper   [facility] Specialty Pharmacist

## 2019-02-17 ENCOUNTER — Other Ambulatory Visit: Payer: Self-pay

## 2019-02-17 DIAGNOSIS — Q851 Tuberous sclerosis: Secondary | ICD-10-CM

## 2019-02-17 MED ORDER — GABAPENTIN 300 MG PO CAPS: ORAL_CAPSULE | ORAL | 3 refills | Status: DC

## 2019-02-17 MED ORDER — DIVALPROEX SODIUM 250 MG PO DR TAB: 250.0000 mg | DELAYED_RELEASE_TABLET | Freq: Two times a day (BID) | ORAL | 3 refills | Status: DC

## 2019-02-18 DIAGNOSIS — Z940 Kidney transplant status: Secondary | ICD-10-CM | POA: Diagnosis not present

## 2019-02-19 NOTE — Unmapped (Signed)
[facility] Refill Coordination Note    Specialty Medication(s) to be Shipped:   Transplant: Prograf 1mg     Other medication(s) to be shipped: N/A     [name], DOB: [birth year]  Phone: [phone] (home)       All above HIPAA information was verified with patient's caregiver. (wife)    Completed refill call assessment today to schedule patient's medication shipment from the [facility] [phone]).       Specialty medication(s) and dose(s) confirmed: Regimen is correct and unchanged.   Changes to medications: Josealberto reports no changes at this time.  Changes to insurance: No  Questions for the pharmacist: No    Confirmed patient received Welcome Packet with first shipment. The patient will receive a drug information handout for each medication shipped and additional FDA Medication Guides as required.       DISEASE/MEDICATION-SPECIFIC INFORMATION        N/A    SPECIALTY MEDICATION ADHERENCE     Medication Adherence    Patient reported X missed doses in the last month: 0  Specialty Medication: Prograf 1mg   Patient is on additional specialty medications: No  Adherence tools used: patient uses a pill box to manage medications  Support network for adherence: family member          Prograf 1 mg: 7 days of medicine on hand     SHIPPING     Shipping address confirmed in Epic.     Delivery Scheduled: Yes, Expected medication delivery date: 02/24/2019.     Medication will be delivered via UPS to the home address in [employer] [place].    Oretha Milch   [facility] Specialty Technician

## 2019-02-21 MED FILL — PROGRAF 1 MG CAPSULE: ORAL | 30 days supply | Qty: 120 | Fill #6

## 2019-02-21 MED FILL — PROGRAF 1 MG CAPSULE: 30 days supply | Qty: 120 | Fill #6 | Status: AC

## 2019-03-04 NOTE — Unmapped (Signed)
[facility] Refill Coordination Note    Specialty Medication(s) to be Shipped:   Transplant: Prednisone 5mg     Other medication(s) to be shipped: N/A     Oscar [name], DOB: 10/10/1975  Phone: [phone] (home)       All above HIPAA information was verified with patient's caregiver. (mom)    Completed refill call assessment today to schedule patient's medication shipment from the [facility] [phone]).       Specialty medication(s) and dose(s) confirmed: Regimen is correct and unchanged.   Changes to medications: Wilbur reports no changes at this time.  Changes to insurance: No  Questions for the pharmacist: No    Confirmed patient received Welcome Packet with first shipment. The patient will receive a drug information handout for each medication shipped and additional FDA Medication Guides as required.       DISEASE/MEDICATION-SPECIFIC INFORMATION        N/A    SPECIALTY MEDICATION ADHERENCE     Medication Adherence    Patient reported X missed doses in the last month: 0  Specialty Medication: Prednisone 5mg   Patient is on additional specialty medications: No  Adherence tools used: patient uses a pill box to manage medications  Support network for adherence: family member          Prednisone 5 mg: 10 days of medicine on hand     SHIPPING     Shipping address confirmed in Epic.     Delivery Scheduled: Yes, Expected medication delivery date: 03/11/2019.     Medication will be delivered via UPS to the home address in [employer] [place].    Oretha Milch   [facility] Specialty Technician

## 2019-03-10 MED FILL — PREDNISONE 5 MG TABLET: 90 days supply | Qty: 90 | Fill #3 | Status: AC

## 2019-03-10 MED FILL — PREDNISONE 5 MG TABLET: ORAL | 90 days supply | Qty: 90 | Fill #3

## 2019-03-17 NOTE — Unmapped (Signed)
[facility] Special[facility] Pharmacy Refill Coordination Note    Specialty Medication(s) to be Shipped:   Transplant: Prograf 1mg     Other medication(s) to be shipped: N/A     Oscar [name], DOB: 02/24/1976  Phone: [phone] (home)       All above HIPAA information was verified with patient's family member. [name])    Completed refill call assessment today to schedule patient's medication shipment from the [facility] [phone]).       Specialty medication(s) and dose(s) confirmed: Regimen is correct and unchanged.   Changes to medications: Curly reports no changes at this time.  Changes to insurance: No  Questions for the pharmacist: No    Confirmed patient received Welcome Packet with first shipment. The patient will receive a drug information handout for each medication shipped and additional FDA Medication Guides as required.       DISEASE/MEDICATION-SPECIFIC INFORMATION        N/A    SPECIALTY MEDICATION ADHERENCE     Medication Adherence    Patient reported X missed doses in the last month: 0  Specialty Medication: Prograf 1mg   Patient is on additional specialty medications: No  Adherence tools used: patient uses a pill box to manage medications  Support network for adherence: family member          Prograf 1 mg: 10 days of medicine on hand     SHIPPING     Shipping address confirmed in Epic.     Delivery Scheduled: Yes, Expected medication delivery date: 03/25/2019.     Medication will be delivered via UPS to the prescription address in [place].    Lorelei Pont [facility] Specialty Technician

## 2019-03-24 MED FILL — PROGRAF 1 MG CAPSULE: 30 days supply | Qty: 120 | Fill #7 | Status: AC

## 2019-03-24 MED FILL — PROGRAF 1 MG CAPSULE: ORAL | 30 days supply | Qty: 120 | Fill #7

## 2019-03-27 DIAGNOSIS — Z940 Kidney transplant status: Secondary | ICD-10-CM | POA: Diagnosis not present

## 2019-04-16 NOTE — Unmapped (Signed)
[facility] Pharm[facility] Refill Coordination Note    Specialty Medication(s) to be Shipped:   Transplant: Prograf 1mg     Other medication(s) to be shipped: N/A     Oscar [name], DOB: 1976/02/22  Phone: [phone] (home)       All above HIPAA information was verified with patient's family member. (mother)    Completed refill call assessment today to schedule patient's medication shipment from the [facility] [phone]).       Specialty medication(s) and dose(s) confirmed: Regimen is correct and unchanged.   Changes to medications: Oscar [name] reports no changes at this time.  Changes to insurance: No  Questions for the pharmacist: No    Confirmed patient received Welcome Packet with first shipment. The patient will receive a drug information handout for each medication shipped and additional FDA Medication Guides as required.       DISEASE/MEDICATION-SPECIFIC INFORMATION        N/A    SPECIALTY MEDICATION ADHERENCE     Medication Adherence    Patient reported X missed doses in the last month: 0  Specialty Medication: Prograf 1mg   Patient is on additional specialty medications: No  Adherence tools used: patient uses a pill box to manage medications  Support network for adherence: family member          Prograf 1 mg: 14 days of medicine on hand      SHIPPING     Shipping address confirmed in Epic.     Delivery Scheduled: Yes, Expected medication delivery date: 04/28/2019.     Medication will be delivered via UPS to the prescription address in [employer] [place].    Lorelei Pont [facility] Specialty Technician

## 2019-04-21 DIAGNOSIS — N2581 Secondary hyperparathyroidism of renal origin: Secondary | ICD-10-CM | POA: Diagnosis not present

## 2019-04-21 DIAGNOSIS — I10 Essential (primary) hypertension: Secondary | ICD-10-CM | POA: Diagnosis not present

## 2019-04-21 DIAGNOSIS — Z940 Kidney transplant status: Secondary | ICD-10-CM | POA: Diagnosis not present

## 2019-04-21 DIAGNOSIS — D849 Immunodeficiency, unspecified: Secondary | ICD-10-CM | POA: Diagnosis not present

## 2019-04-21 DIAGNOSIS — E559 Vitamin D deficiency, unspecified: Secondary | ICD-10-CM | POA: Diagnosis not present

## 2019-04-25 MED FILL — PROGRAF 1 MG CAPSULE: 30 days supply | Qty: 120 | Fill #8 | Status: AC

## 2019-04-25 MED FILL — PROGRAF 1 MG CAPSULE: ORAL | 30 days supply | Qty: 120 | Fill #8

## 2019-05-02 MED ORDER — OMEPRAZOLE 20 MG CAPSULE,DELAYED RELEASE
ORAL_CAPSULE | 3 refills | 0 days | Status: CP
Start: 2019-05-02 — End: ?

## 2019-05-18 IMAGING — DX DG FOOT COMPLETE 3+V*R*
3 series · 3 of 3 positions shown · non-contrast
Comparison: Radiographs August 29, 2010.

CLINICAL DATA: Right foot pain after fall last week.

EXAM:
RIGHT FOOT COMPLETE - 3+ VIEW

[foot ap]
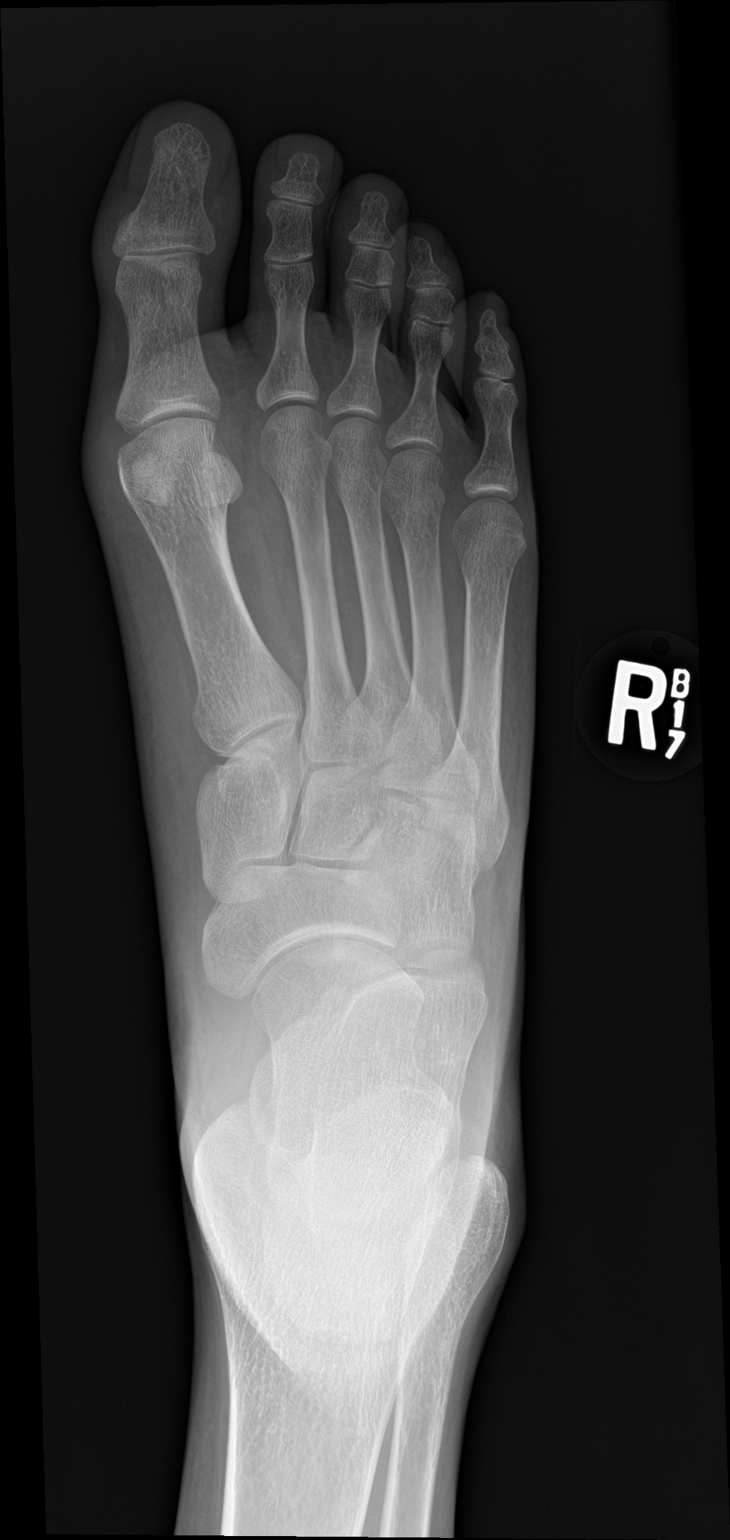

[foot obl]
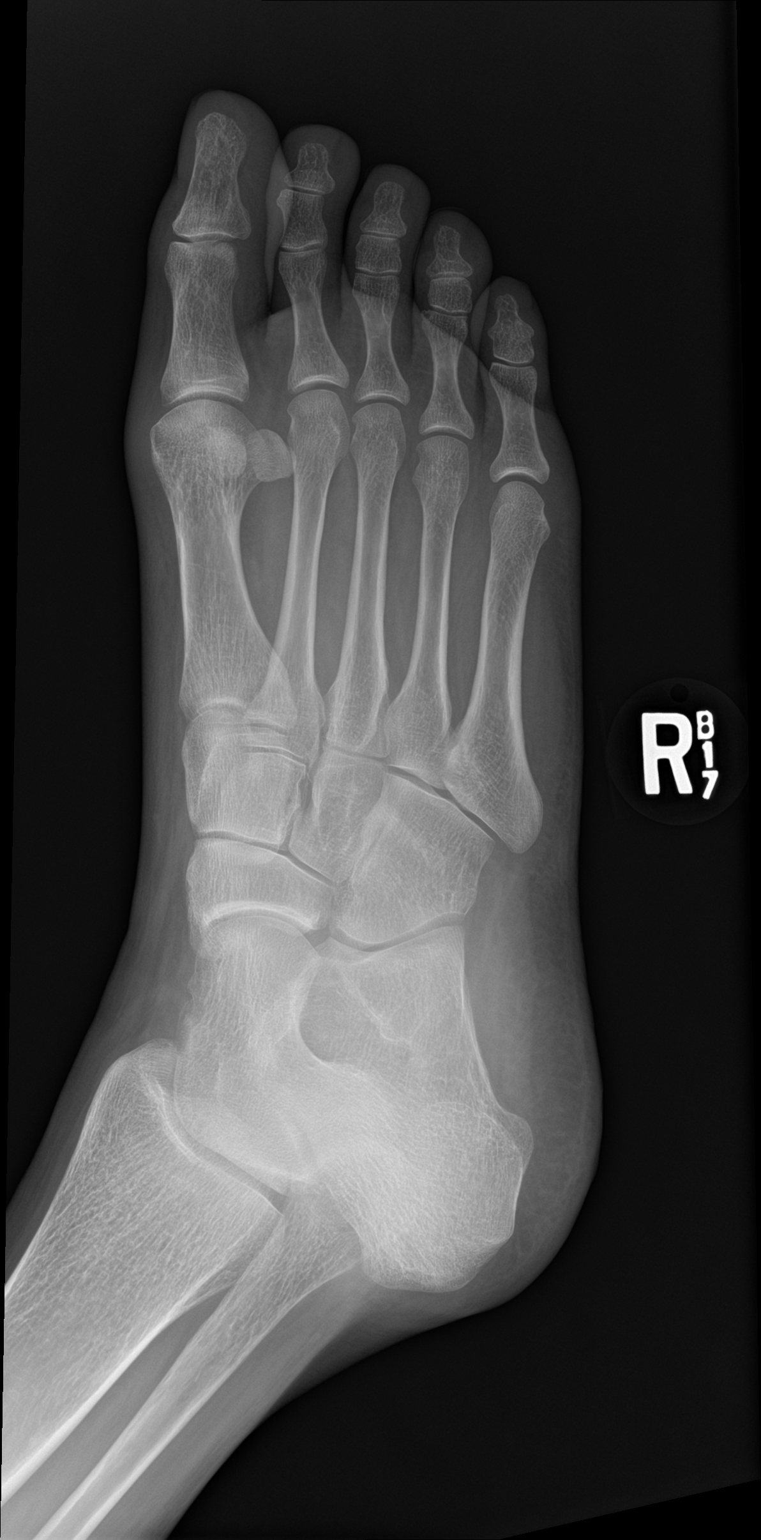

[foot lat]
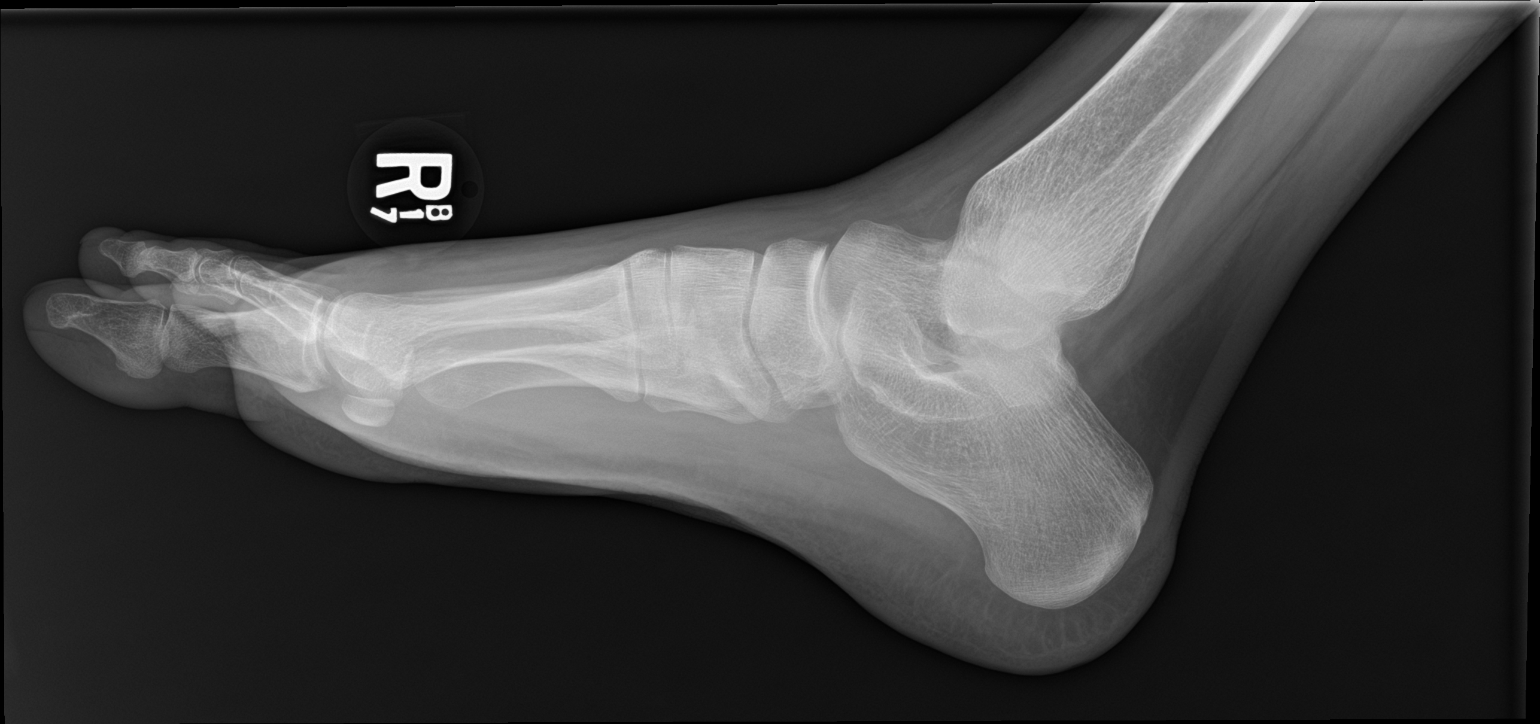

[3 of 3 positions shown; findings below may reference images not displayed]

FINDINGS: There is no evidence of fracture or dislocation. There is no
evidence of arthropathy or other focal bone abnormality. Soft
tissues are unremarkable.
IMPRESSION: Normal right foot.

## 2019-05-21 NOTE — Unmapped (Signed)
[facility] Refill Coordination Note    Specialty Medication(s) to be Shipped:   Transplant: Prograf 1mg     Other medication(s) to be shipped: none     Oscar [name], DOB: 13-Nov-1975  Phone: [phone] (home)       All above HIPAA information was verified with patient's caregiver, wife     Was a translator used for this call? No    Completed refill call assessment today to schedule patient's medication shipment from the [facility] [phone]).       Specialty medication(s) and dose(s) confirmed: Regimen is correct and unchanged.   Changes to medications: Mauri reports no changes at this time.  Changes to insurance: No  Questions for the pharmacist: No    Confirmed patient received Welcome Packet with first shipment. The patient will receive a drug information handout for each medication shipped and additional FDA Medication Guides as required.       DISEASE/MEDICATION-SPECIFIC INFORMATION        N/A    SPECIALTY MEDICATION ADHERENCE     Medication Adherence    Patient reported X missed doses in the last month: 0  Adherence tools used: patient uses a pill box to manage medications  Support network for adherence: family member          Prograf 1mg : 10 days worth of medication on hand.        SHIPPING     Shipping address confirmed in Epic.     Delivery Scheduled: Yes, Expected medication delivery date: 05/27/19.     Medication will be delivered via UPS to the prescription address in [employer] [place].    [place] A Aliyha Fornes   [facility] Specialty Technician

## 2019-05-26 MED FILL — PROGRAF 1 MG CAPSULE: ORAL | 30 days supply | Qty: 120 | Fill #9

## 2019-05-26 MED FILL — PROGRAF 1 MG CAPSULE: 30 days supply | Qty: 120 | Fill #9 | Status: AC

## 2019-06-04 DIAGNOSIS — Z940 Kidney transplant status: Secondary | ICD-10-CM | POA: Diagnosis not present

## 2019-06-18 NOTE — Unmapped (Signed)
[facility] Clinical Assessment & Refill Coordination Note    Oscar [name], DOB: 01/26/1976  Phone: [phone] (home)     All above HIPAA information was verified with patient's family member, mom [place].     Was a Nurse, learning disability used for this call? No    Specialty Medication(s):   Transplant: Prograf 1mg      Current Outpatient Medications   Medication Sig Dispense Refill   ??? divalproex (DEPAKOTE) 250 MG DR tablet Take 2 tablets (500 mg total) by mouth daily at 0600. This prescription is written specifically for brand name depakote. 180 tablet 3   ??? gabapentin (NEURONTIN) 300 MG capsule Take 1 capsule (300 mg total) by mouth nightly. 90 capsule 3   ??? imipramine (TOFRANIL) 25 MG tablet Take 2 tablets (50 mg total) by mouth nightly. 180 tablet 3   ??? losartan (COZAAR) 25 MG tablet Take 1 tablet (25 mg total) by mouth Two (2) times a day. 60 tablet 11   ??? omeprazole (PRILOSEC) 20 MG capsule TAKE 1 CAPSULE BY MOUTH EVERY DAY 90 capsule 3   ??? PROGRAF 1 mg capsule Take 2 capsules (2 mg total) by mouth two (2) times a day. 360 capsule 3     No current facility-administered medications for this visit.         Changes to medications: Ferd reports no changes at this time.    Allergies   Allergen Reactions   ??? Erythromycin    ??? Erythromycin Base      Other reaction(s): NAUSEA   ??? Sulfa (Sulfonamide Antibiotics)      Other reaction(s): ITCHING   ??? Sulfacetamide Sodium        Changes to allergies: No    SPECIALTY MEDICATION ADHERENCE     Prograf 1mg   : 3.5 weeks of medicine on hand       Medication Adherence    Patient reported X missed doses in the last month: 0  Specialty Medication: prograf 1mg   Adherence tools used: patient uses a pill box to manage medications  Support network for adherence: family member          Specialty medication(s) dose(s) confirmed: Regimen is correct and unchanged.     Are there any concerns with adherence? No    Adherence counseling provided? Not needed CLINICAL MANAGEMENT AND INTERVENTION      Clinical Benefit Assessment:    Do you feel the medicine is effective or helping your condition? Yes    Clinical Benefit counseling provided? Not needed    Adverse Effects Assessment:    Are you experiencing any side effects? No    Are you experiencing difficulty administering your medicine? No    Quality of Life Assessment:    How many days over the past month did your transplant  keep you from your normal activities? For example, brushing your teeth or getting up in the morning. 0    Have you discussed this with your provider? Not needed    Therapy Appropriateness:    Is therapy appropriate? Yes, therapy is appropriate and should be continued    DISEASE/MEDICATION-SPECIFIC INFORMATION      N/A    PATIENT SPECIFIC NEEDS     ? Does the patient have any physical, cognitive, or cultural barriers? No    ? Is the patient high risk? No     ? Does the patient require a Care Management Plan? No     ? Does the patient require physician intervention  or other additional services (i.e. nutrition, smoking cessation, social work)? No      SHIPPING     Specialty Medication(s) to be Shipped:   na    Other medication(s) to be shipped: na     Changes to insurance: No    Delivery Scheduled: Patient declined refill at this time due to mom wants call back in 1.5-2 weeks.     Medication will be delivered via na to the confirmed na address in [facility].    The patient will receive a drug information handout for each medication shipped and additional FDA Medication Guides as required.  Verified that patient has previously received a Conservation officer, historic buildings.    All of the patient's questions and concerns have been addressed.    Thad Ranger   [facility] Specialty Pharmacist

## 2019-06-26 DIAGNOSIS — Z940 Kidney transplant status: Principal | ICD-10-CM

## 2019-06-26 MED ORDER — PROGRAF 1 MG CAPSULE
ORAL_CAPSULE | Freq: Two times a day (BID) | ORAL | 3 refills | 90.00000 days | Status: CP
Start: 2019-06-26 — End: 2020-06-25
  Filled 2019-06-30: qty 360, 90d supply, fill #0

## 2019-06-26 MED ORDER — PREDNISONE 5 MG TABLET
ORAL_TABLET | Freq: Every day | ORAL | 3 refills | 90.00000 days | Status: CP
Start: 2019-06-26 — End: 2020-06-25
  Filled 2019-06-30: qty 90, 90d supply, fill #0

## 2019-06-26 NOTE — Unmapped (Signed)
[facility] Refill Coordination Note    Specialty Medication(s) to be Shipped:   Transplant: Prograf 1mg  and Prednisone 5mg     Other medication(s) to be shipped:       Oscar [name], DOB: 11-Oct-1975  Phone: [phone] (home)       All above HIPAA information was verified with patient's family member, Shelby-mom.     Was a Nurse, learning disability used for this call? No    Completed refill call assessment today to schedule patient's medication shipment from the [facility] [phone]).       Specialty medication(s) and dose(s) confirmed: Regimen is correct and unchanged.   Changes to medications: Valdis reports no changes at this time.  Changes to insurance: No  Questions for the pharmacist: No    Confirmed patient received Welcome Packet with first shipment. The patient will receive a drug information handout for each medication shipped and additional FDA Medication Guides as required.       DISEASE/MEDICATION-SPECIFIC INFORMATION        N/A    SPECIALTY MEDICATION ADHERENCE     Medication Adherence    Patient reported X missed doses in the last month: 0  Specialty Medication: Prograf 1mg   Patient is on additional specialty medications: Yes  Additional Specialty Medications: Prednisone 5mg   Patient Reported Additional Medication X Missed Doses in the Last Month: 0  Patient is on more than two specialty medications: No  Informant: mother  Adherence tools used: patient uses a pill box to manage medications  Support network for adherence: family member                Prograf 1 mg: 10 days of medicine on hand   Prednisone 5 mg: 10 days of medicine on hand         SHIPPING     Shipping address confirmed in Epic.     Delivery Scheduled: Yes, Expected medication delivery date: 02/16.     Medication will be delivered via UPS to the prescription address in [employer] [place].    Antonietta Barcelona   [facility] Specialty Technician

## 2019-06-30 MED FILL — PROGRAF 1 MG CAPSULE: 90 days supply | Qty: 360 | Fill #0 | Status: AC

## 2019-06-30 MED FILL — PREDNISONE 5 MG TABLET: 90 days supply | Qty: 90 | Fill #0 | Status: AC

## 2019-07-16 DIAGNOSIS — Z940 Kidney transplant status: Secondary | ICD-10-CM | POA: Diagnosis not present

## 2019-07-24 DIAGNOSIS — E559 Vitamin D deficiency, unspecified: Secondary | ICD-10-CM | POA: Diagnosis not present

## 2019-07-24 DIAGNOSIS — N2581 Secondary hyperparathyroidism of renal origin: Secondary | ICD-10-CM | POA: Diagnosis not present

## 2019-07-24 DIAGNOSIS — I10 Essential (primary) hypertension: Secondary | ICD-10-CM | POA: Diagnosis not present

## 2019-07-24 DIAGNOSIS — D849 Immunodeficiency, unspecified: Secondary | ICD-10-CM | POA: Diagnosis not present

## 2019-07-24 DIAGNOSIS — Z940 Kidney transplant status: Secondary | ICD-10-CM | POA: Diagnosis not present

## 2019-09-05 ENCOUNTER — Other Ambulatory Visit: Payer: Self-pay

## 2019-09-05 ENCOUNTER — Inpatient Hospital Stay (HOSPITAL_COMMUNITY)
Admission: EM | Admit: 2019-09-05 | Discharge: 2019-09-09 | DRG: 699 | Disposition: A | Discharge status: Home / Self Care | Payer: Medicare Other | Attending: Internal Medicine | Admitting: Internal Medicine

## 2019-09-05 ENCOUNTER — Encounter (HOSPITAL_COMMUNITY): Payer: Self-pay | Admitting: Emergency Medicine

## 2019-09-05 DIAGNOSIS — I712 Thoracic aortic aneurysm, without rupture: Secondary | ICD-10-CM | POA: Diagnosis present

## 2019-09-05 DIAGNOSIS — N2581 Secondary hyperparathyroidism of renal origin: Secondary | ICD-10-CM | POA: Diagnosis present

## 2019-09-05 DIAGNOSIS — Z809 Family history of malignant neoplasm, unspecified: Secondary | ICD-10-CM | POA: Diagnosis not present

## 2019-09-05 DIAGNOSIS — F988 Other specified behavioral and emotional disorders with onset usually occurring in childhood and adolescence: Secondary | ICD-10-CM | POA: Diagnosis present

## 2019-09-05 DIAGNOSIS — Z882 Allergy status to sulfonamides status: Secondary | ICD-10-CM

## 2019-09-05 DIAGNOSIS — Z940 Kidney transplant status: Secondary | ICD-10-CM

## 2019-09-05 DIAGNOSIS — K8020 Calculus of gallbladder without cholecystitis without obstruction: Secondary | ICD-10-CM | POA: Diagnosis not present

## 2019-09-05 DIAGNOSIS — Z87440 Personal history of urinary (tract) infections: Secondary | ICD-10-CM

## 2019-09-05 DIAGNOSIS — M109 Gout, unspecified: Secondary | ICD-10-CM | POA: Diagnosis present

## 2019-09-05 DIAGNOSIS — T8613 Kidney transplant infection: Secondary | ICD-10-CM | POA: Diagnosis not present

## 2019-09-05 DIAGNOSIS — N12 Tubulo-interstitial nephritis, not specified as acute or chronic: Secondary | ICD-10-CM | POA: Diagnosis present

## 2019-09-05 DIAGNOSIS — T8619 Other complication of kidney transplant: Principal | ICD-10-CM | POA: Diagnosis present

## 2019-09-05 DIAGNOSIS — I700 Atherosclerosis of aorta: Secondary | ICD-10-CM | POA: Diagnosis present

## 2019-09-05 DIAGNOSIS — Z20822 Contact with and (suspected) exposure to covid-19: Secondary | ICD-10-CM | POA: Diagnosis present

## 2019-09-05 DIAGNOSIS — K219 Gastro-esophageal reflux disease without esophagitis: Secondary | ICD-10-CM | POA: Diagnosis present

## 2019-09-05 DIAGNOSIS — Z85528 Personal history of other malignant neoplasm of kidney: Secondary | ICD-10-CM | POA: Diagnosis not present

## 2019-09-05 DIAGNOSIS — R112 Nausea with vomiting, unspecified: Secondary | ICD-10-CM

## 2019-09-05 DIAGNOSIS — Z888 Allergy status to other drugs, medicaments and biological substances status: Secondary | ICD-10-CM | POA: Diagnosis not present

## 2019-09-05 DIAGNOSIS — D631 Anemia in chronic kidney disease: Secondary | ICD-10-CM | POA: Diagnosis present

## 2019-09-05 DIAGNOSIS — Z7952 Long term (current) use of systemic steroids: Secondary | ICD-10-CM | POA: Diagnosis not present

## 2019-09-05 DIAGNOSIS — Z905 Acquired absence of kidney: Secondary | ICD-10-CM

## 2019-09-05 DIAGNOSIS — I10 Essential (primary) hypertension: Secondary | ICD-10-CM | POA: Diagnosis present

## 2019-09-05 DIAGNOSIS — N189 Chronic kidney disease, unspecified: Secondary | ICD-10-CM | POA: Diagnosis present

## 2019-09-05 DIAGNOSIS — N179 Acute kidney failure, unspecified: Secondary | ICD-10-CM | POA: Diagnosis not present

## 2019-09-05 DIAGNOSIS — Q851 Tuberous sclerosis: Secondary | ICD-10-CM | POA: Diagnosis not present

## 2019-09-05 DIAGNOSIS — K5730 Diverticulosis of large intestine without perforation or abscess without bleeding: Secondary | ICD-10-CM | POA: Diagnosis not present

## 2019-09-05 DIAGNOSIS — N183 Chronic kidney disease, stage 3 unspecified: Secondary | ICD-10-CM | POA: Diagnosis not present

## 2019-09-05 DIAGNOSIS — E869 Volume depletion, unspecified: Secondary | ICD-10-CM | POA: Diagnosis present

## 2019-09-05 DIAGNOSIS — Z825 Family history of asthma and other chronic lower respiratory diseases: Secondary | ICD-10-CM | POA: Diagnosis not present

## 2019-09-05 DIAGNOSIS — R1110 Vomiting, unspecified: Secondary | ICD-10-CM | POA: Diagnosis not present

## 2019-09-05 DIAGNOSIS — G2581 Restless legs syndrome: Secondary | ICD-10-CM | POA: Diagnosis not present

## 2019-09-05 DIAGNOSIS — R509 Fever, unspecified: Secondary | ICD-10-CM | POA: Diagnosis not present

## 2019-09-05 DIAGNOSIS — Z8249 Family history of ischemic heart disease and other diseases of the circulatory system: Secondary | ICD-10-CM

## 2019-09-05 DIAGNOSIS — F419 Anxiety disorder, unspecified: Secondary | ICD-10-CM | POA: Diagnosis present

## 2019-09-05 DIAGNOSIS — E872 Acidosis: Secondary | ICD-10-CM | POA: Diagnosis present

## 2019-09-05 DIAGNOSIS — N1831 Chronic kidney disease, stage 3a: Secondary | ICD-10-CM | POA: Diagnosis not present

## 2019-09-05 DIAGNOSIS — N3090 Cystitis, unspecified without hematuria: Secondary | ICD-10-CM | POA: Diagnosis present

## 2019-09-05 DIAGNOSIS — Z79899 Other long term (current) drug therapy: Secondary | ICD-10-CM

## 2019-09-05 DIAGNOSIS — R000 Tachycardia, unspecified: Secondary | ICD-10-CM | POA: Diagnosis not present

## 2019-09-05 DIAGNOSIS — I129 Hypertensive chronic kidney disease with stage 1 through stage 4 chronic kidney disease, or unspecified chronic kidney disease: Secondary | ICD-10-CM | POA: Diagnosis not present

## 2019-09-05 DIAGNOSIS — Y830 Surgical operation with transplant of whole organ as the cause of abnormal reaction of the patient, or of later complication, without mention of misadventure at the time of the procedure: Secondary | ICD-10-CM | POA: Diagnosis present

## 2019-09-05 DIAGNOSIS — R1084 Generalized abdominal pain: Secondary | ICD-10-CM

## 2019-09-05 DIAGNOSIS — E875 Hyperkalemia: Secondary | ICD-10-CM | POA: Diagnosis present

## 2019-09-05 DIAGNOSIS — G40909 Epilepsy, unspecified, not intractable, without status epilepticus: Secondary | ICD-10-CM | POA: Diagnosis present

## 2019-09-05 DIAGNOSIS — R809 Proteinuria, unspecified: Secondary | ICD-10-CM | POA: Diagnosis present

## 2019-09-05 LAB — COMPREHENSIVE METABOLIC PANEL
ALT: 20 U/L (ref 0–44)
AST: 15 U/L (ref 15–41)
Albumin: 4.2 g/dL (ref 3.5–5.0)
Alkaline Phosphatase: 69 U/L (ref 38–126)
Anion gap: 10 (ref 5–15)
BUN: 30 mg/dL — ABNORMAL HIGH (ref 6–20)
CO2: 20 mmol/L — ABNORMAL LOW (ref 22–32)
Calcium: 9.8 mg/dL (ref 8.9–10.3)
Chloride: 107 mmol/L (ref 98–111)
Creatinine, Ser: 2.62 mg/dL — ABNORMAL HIGH (ref 0.61–1.24)
GFR calc Af Amer: 33 mL/min — ABNORMAL LOW (ref >60)
GFR calc non Af Amer: 28 mL/min — ABNORMAL LOW (ref >60)
Glucose, Bld: 126 mg/dL — ABNORMAL HIGH (ref 70–99)
Potassium: 4.7 mmol/L (ref 3.5–5.1)
Sodium: 137 mmol/L (ref 135–145)
Total Bilirubin: 1 mg/dL (ref 0.3–1.2)
Total Protein: 8 g/dL (ref 6.5–8.1)

## 2019-09-05 LAB — URINALYSIS, ROUTINE W REFLEX MICROSCOPIC
Bacteria, UA: NONE SEEN
Bilirubin Urine: NEGATIVE
Glucose, UA: NEGATIVE mg/dL
Hgb urine dipstick: NEGATIVE
Ketones, ur: 5 mg/dL — AB
Leukocytes,Ua: NEGATIVE
Nitrite: NEGATIVE
Protein, ur: >=300 mg/dL — AB
Specific Gravity, Urine: 1.018 (ref 1.005–1.030)
pH: 5 (ref 5.0–8.0)

## 2019-09-05 LAB — CBC
HCT: 41.1 % (ref 39.0–52.0)
Hemoglobin: 13.9 g/dL (ref 13.0–17.0)
MCH: 30.5 pg (ref 26.0–34.0)
MCHC: 33.8 g/dL (ref 30.0–36.0)
MCV: 90.1 fL (ref 80.0–100.0)
Platelets: 250 10*3/uL (ref 150–400)
RBC: 4.56 MIL/uL (ref 4.22–5.81)
RDW: 13.1 % (ref 11.5–15.5)
WBC: 14.3 10*3/uL — ABNORMAL HIGH (ref 4.0–10.5)
nRBC: 0 % (ref 0.0–0.2)

## 2019-09-05 LAB — LIPASE, BLOOD: Lipase: 44 U/L (ref 11–51)

## 2019-09-05 MED ORDER — SODIUM CHLORIDE 0.9% FLUSH: 3.0000 mL | Freq: Once | INTRAVENOUS | Status: DC

## 2019-09-05 NOTE — ED Notes (Signed)
Notified pt that urine sample is needed.  Gave pt urinal to make it easier to collect urine.  Pt is resting in recliner

## 2019-09-05 NOTE — ED Triage Notes (Signed)
Pt c/o nausea/vomiting x 2 days. Denies abdominal pain or diarrhea. Hx kidney transplant in 2001.

## 2019-09-06 ENCOUNTER — Encounter (HOSPITAL_COMMUNITY): Payer: Self-pay | Admitting: Emergency Medicine

## 2019-09-06 ENCOUNTER — Emergency Department (HOSPITAL_COMMUNITY): Payer: Medicare Other

## 2019-09-06 DIAGNOSIS — R1110 Vomiting, unspecified: Secondary | ICD-10-CM | POA: Diagnosis present

## 2019-09-06 DIAGNOSIS — N183 Chronic kidney disease, stage 3 unspecified: Secondary | ICD-10-CM | POA: Diagnosis not present

## 2019-09-06 DIAGNOSIS — I700 Atherosclerosis of aorta: Secondary | ICD-10-CM | POA: Diagnosis present

## 2019-09-06 DIAGNOSIS — Q851 Tuberous sclerosis: Secondary | ICD-10-CM | POA: Diagnosis not present

## 2019-09-06 DIAGNOSIS — R1084 Generalized abdominal pain: Secondary | ICD-10-CM | POA: Diagnosis not present

## 2019-09-06 DIAGNOSIS — I10 Essential (primary) hypertension: Secondary | ICD-10-CM

## 2019-09-06 DIAGNOSIS — T8613 Kidney transplant infection: Secondary | ICD-10-CM | POA: Diagnosis present

## 2019-09-06 DIAGNOSIS — T8619 Other complication of kidney transplant: Secondary | ICD-10-CM | POA: Diagnosis present

## 2019-09-06 DIAGNOSIS — F988 Other specified behavioral and emotional disorders with onset usually occurring in childhood and adolescence: Secondary | ICD-10-CM | POA: Diagnosis present

## 2019-09-06 DIAGNOSIS — N1831 Chronic kidney disease, stage 3a: Secondary | ICD-10-CM

## 2019-09-06 DIAGNOSIS — K219 Gastro-esophageal reflux disease without esophagitis: Secondary | ICD-10-CM | POA: Diagnosis present

## 2019-09-06 DIAGNOSIS — M109 Gout, unspecified: Secondary | ICD-10-CM | POA: Diagnosis not present

## 2019-09-06 DIAGNOSIS — Z20822 Contact with and (suspected) exposure to covid-19: Secondary | ICD-10-CM | POA: Diagnosis present

## 2019-09-06 DIAGNOSIS — R509 Fever, unspecified: Secondary | ICD-10-CM | POA: Diagnosis not present

## 2019-09-06 DIAGNOSIS — E872 Acidosis: Secondary | ICD-10-CM | POA: Diagnosis present

## 2019-09-06 DIAGNOSIS — Z87440 Personal history of urinary (tract) infections: Secondary | ICD-10-CM | POA: Diagnosis not present

## 2019-09-06 DIAGNOSIS — N179 Acute kidney failure, unspecified: Secondary | ICD-10-CM | POA: Diagnosis present

## 2019-09-06 DIAGNOSIS — Z888 Allergy status to other drugs, medicaments and biological substances status: Secondary | ICD-10-CM | POA: Diagnosis not present

## 2019-09-06 DIAGNOSIS — Z882 Allergy status to sulfonamides status: Secondary | ICD-10-CM | POA: Diagnosis not present

## 2019-09-06 DIAGNOSIS — D631 Anemia in chronic kidney disease: Secondary | ICD-10-CM | POA: Diagnosis not present

## 2019-09-06 DIAGNOSIS — N189 Chronic kidney disease, unspecified: Secondary | ICD-10-CM | POA: Diagnosis present

## 2019-09-06 DIAGNOSIS — K8020 Calculus of gallbladder without cholecystitis without obstruction: Secondary | ICD-10-CM | POA: Diagnosis present

## 2019-09-06 DIAGNOSIS — Z79899 Other long term (current) drug therapy: Secondary | ICD-10-CM | POA: Diagnosis not present

## 2019-09-06 DIAGNOSIS — Z7952 Long term (current) use of systemic steroids: Secondary | ICD-10-CM | POA: Diagnosis not present

## 2019-09-06 DIAGNOSIS — R000 Tachycardia, unspecified: Secondary | ICD-10-CM | POA: Diagnosis not present

## 2019-09-06 DIAGNOSIS — I129 Hypertensive chronic kidney disease with stage 1 through stage 4 chronic kidney disease, or unspecified chronic kidney disease: Secondary | ICD-10-CM | POA: Diagnosis not present

## 2019-09-06 DIAGNOSIS — N12 Tubulo-interstitial nephritis, not specified as acute or chronic: Secondary | ICD-10-CM | POA: Diagnosis present

## 2019-09-06 DIAGNOSIS — N2581 Secondary hyperparathyroidism of renal origin: Secondary | ICD-10-CM | POA: Diagnosis present

## 2019-09-06 DIAGNOSIS — Z809 Family history of malignant neoplasm, unspecified: Secondary | ICD-10-CM | POA: Diagnosis not present

## 2019-09-06 DIAGNOSIS — K5730 Diverticulosis of large intestine without perforation or abscess without bleeding: Secondary | ICD-10-CM | POA: Diagnosis not present

## 2019-09-06 DIAGNOSIS — Y830 Surgical operation with transplant of whole organ as the cause of abnormal reaction of the patient, or of later complication, without mention of misadventure at the time of the procedure: Secondary | ICD-10-CM | POA: Diagnosis present

## 2019-09-06 DIAGNOSIS — F419 Anxiety disorder, unspecified: Secondary | ICD-10-CM | POA: Diagnosis present

## 2019-09-06 DIAGNOSIS — Z825 Family history of asthma and other chronic lower respiratory diseases: Secondary | ICD-10-CM | POA: Diagnosis not present

## 2019-09-06 DIAGNOSIS — Z940 Kidney transplant status: Secondary | ICD-10-CM | POA: Diagnosis not present

## 2019-09-06 DIAGNOSIS — G2581 Restless legs syndrome: Secondary | ICD-10-CM | POA: Diagnosis present

## 2019-09-06 DIAGNOSIS — Z8249 Family history of ischemic heart disease and other diseases of the circulatory system: Secondary | ICD-10-CM | POA: Diagnosis not present

## 2019-09-06 DIAGNOSIS — Z85528 Personal history of other malignant neoplasm of kidney: Secondary | ICD-10-CM | POA: Diagnosis not present

## 2019-09-06 DIAGNOSIS — R112 Nausea with vomiting, unspecified: Secondary | ICD-10-CM | POA: Diagnosis not present

## 2019-09-06 LAB — BASIC METABOLIC PANEL
Anion gap: 9 (ref 5–15)
BUN: 31 mg/dL — ABNORMAL HIGH (ref 6–20)
CO2: 17 mmol/L — ABNORMAL LOW (ref 22–32)
Calcium: 8.1 mg/dL — ABNORMAL LOW (ref 8.9–10.3)
Chloride: 113 mmol/L — ABNORMAL HIGH (ref 98–111)
Creatinine, Ser: 2.51 mg/dL — ABNORMAL HIGH (ref 0.61–1.24)
GFR calc Af Amer: 34 mL/min — ABNORMAL LOW (ref >60)
GFR calc non Af Amer: 30 mL/min — ABNORMAL LOW (ref >60)
Glucose, Bld: 97 mg/dL (ref 70–99)
Potassium: 5.2 mmol/L — ABNORMAL HIGH (ref 3.5–5.1)
Sodium: 139 mmol/L (ref 135–145)

## 2019-09-06 LAB — CBC
HCT: 36.1 % — ABNORMAL LOW (ref 39.0–52.0)
Hemoglobin: 11.7 g/dL — ABNORMAL LOW (ref 13.0–17.0)
MCH: 30.1 pg (ref 26.0–34.0)
MCHC: 32.4 g/dL (ref 30.0–36.0)
MCV: 92.8 fL (ref 80.0–100.0)
Platelets: 177 10*3/uL (ref 150–400)
RBC: 3.89 MIL/uL — ABNORMAL LOW (ref 4.22–5.81)
RDW: 13.2 % (ref 11.5–15.5)
WBC: 10.7 10*3/uL — ABNORMAL HIGH (ref 4.0–10.5)
nRBC: 0 % (ref 0.0–0.2)

## 2019-09-06 LAB — HIV ANTIBODY (ROUTINE TESTING W REFLEX): HIV Screen 4th Generation wRfx: NONREACTIVE

## 2019-09-06 LAB — PROTIME-INR
INR: 1.1 (ref 0.8–1.2)
Prothrombin Time: 14 seconds (ref 11.4–15.2)

## 2019-09-06 LAB — APTT: aPTT: 27 seconds (ref 24–36)

## 2019-09-06 LAB — CREATININE, URINE, RANDOM: Creatinine, Urine: 399.28 mg/dL

## 2019-09-06 LAB — SODIUM, URINE, RANDOM: Sodium, Ur: 35 mmol/L

## 2019-09-06 LAB — LACTIC ACID, PLASMA
Lactic Acid, Venous: 0.9 mmol/L (ref 0.5–1.9)
Lactic Acid, Venous: 0.9 mmol/L (ref 0.5–1.9)

## 2019-09-06 LAB — MAGNESIUM: Magnesium: 1.5 mg/dL — ABNORMAL LOW (ref 1.7–2.4)

## 2019-09-06 LAB — SARS CORONAVIRUS 2 (TAT 6-24 HRS): SARS Coronavirus 2: NEGATIVE

## 2019-09-06 LAB — PHOSPHORUS: Phosphorus: 3.3 mg/dL (ref 2.5–4.6)

## 2019-09-06 LAB — POC SARS CORONAVIRUS 2 AG -  ED: SARS Coronavirus 2 Ag: NEGATIVE

## 2019-09-06 MED ORDER — PREDNISONE 5 MG PO TABS
5.0000 mg | ORAL_TABLET | Freq: Every day | ORAL | Status: DC
  Administered 2019-09-06 – 2019-09-09 (×4): 5 mg via ORAL
  Filled 2019-09-06 (×5): qty 1

## 2019-09-06 MED ORDER — ENOXAPARIN SODIUM 40 MG/0.4ML ~~LOC~~ SOLN
40.0000 mg | Freq: Every day | SUBCUTANEOUS | Status: DC
  Administered 2019-09-07 – 2019-09-09 (×3): 40 mg via SUBCUTANEOUS
  Filled 2019-09-06 (×4): qty 0.4

## 2019-09-06 MED ORDER — SODIUM CHLORIDE 0.9 % IV SOLN: INTRAVENOUS | Status: DC

## 2019-09-06 MED ORDER — LOSARTAN POTASSIUM 50 MG PO TABS
50.0000 mg | ORAL_TABLET | Freq: Two times a day (BID) | ORAL | Status: DC
  Filled 2019-09-06: qty 1

## 2019-09-06 MED ORDER — TACROLIMUS 1 MG PO CAPS
2.0000 mg | ORAL_CAPSULE | Freq: Two times a day (BID) | ORAL | Status: DC
  Administered 2019-09-06 – 2019-09-09 (×7): 2 mg via ORAL
  Filled 2019-09-06 (×8): qty 2

## 2019-09-06 MED ORDER — MAGNESIUM SULFATE 2 GM/50ML IV SOLN
2.0000 g | Freq: Once | INTRAVENOUS | Status: AC
  Administered 2019-09-06: 2 g via INTRAVENOUS
  Filled 2019-09-06: qty 50

## 2019-09-06 MED ORDER — SODIUM CHLORIDE 0.9 % IV BOLUS
1000.0000 mL | Freq: Once | INTRAVENOUS | Status: AC
  Administered 2019-09-06: 1000 mL via INTRAVENOUS

## 2019-09-06 MED ORDER — ACETAMINOPHEN 325 MG PO TABS
650.0000 mg | ORAL_TABLET | Freq: Once | ORAL | Status: AC
  Administered 2019-09-06: 650 mg via ORAL
  Filled 2019-09-06: qty 2

## 2019-09-06 MED ORDER — DIPHENHYDRAMINE HCL 50 MG/ML IJ SOLN
25.0000 mg | Freq: Once | INTRAMUSCULAR | Status: AC
  Administered 2019-09-06: 25 mg via INTRAVENOUS
  Filled 2019-09-06: qty 1

## 2019-09-06 MED ORDER — ONDANSETRON HCL 4 MG/2ML IJ SOLN
4.0000 mg | Freq: Once | INTRAMUSCULAR | Status: AC
  Administered 2019-09-06: 4 mg via INTRAVENOUS
  Filled 2019-09-06: qty 2

## 2019-09-06 MED ORDER — PROMETHAZINE HCL 25 MG/ML IJ SOLN
25.0000 mg | Freq: Once | INTRAMUSCULAR | Status: AC
  Administered 2019-09-06: 25 mg via INTRAVENOUS
  Filled 2019-09-06: qty 1

## 2019-09-06 MED ORDER — FENTANYL CITRATE (PF) 100 MCG/2ML IJ SOLN
50.0000 ug | Freq: Once | INTRAMUSCULAR | Status: AC
  Administered 2019-09-06: 50 ug via INTRAVENOUS
  Filled 2019-09-06: qty 2

## 2019-09-06 MED ORDER — HYDRALAZINE HCL 10 MG PO TABS
10.0000 mg | ORAL_TABLET | Freq: Four times a day (QID) | ORAL | Status: DC | PRN
  Administered 2019-09-08: 10 mg via ORAL
  Filled 2019-09-06 (×2): qty 1

## 2019-09-06 MED ORDER — SODIUM CHLORIDE 0.9 % IV SOLN
1.0000 g | INTRAVENOUS | Status: DC
  Administered 2019-09-06 – 2019-09-09 (×4): 1 g via INTRAVENOUS
  Filled 2019-09-06 (×4): qty 10

## 2019-09-06 MED ORDER — ASPIRIN EC 81 MG PO TBEC
81.0000 mg | DELAYED_RELEASE_TABLET | Freq: Every day | ORAL | Status: DC
  Administered 2019-09-06 – 2019-09-09 (×4): 81 mg via ORAL
  Filled 2019-09-06 (×4): qty 1

## 2019-09-06 MED ORDER — DIVALPROEX SODIUM 250 MG PO DR TAB
250.0000 mg | DELAYED_RELEASE_TABLET | Freq: Two times a day (BID) | ORAL | Status: DC
  Administered 2019-09-06 – 2019-09-09 (×7): 250 mg via ORAL
  Filled 2019-09-06 (×8): qty 1

## 2019-09-06 MED ORDER — PANTOPRAZOLE SODIUM 40 MG IV SOLR
40.0000 mg | INTRAVENOUS | Status: DC
  Administered 2019-09-06 – 2019-09-07 (×2): 40 mg via INTRAVENOUS
  Filled 2019-09-06 (×3): qty 40

## 2019-09-06 MED ORDER — GABAPENTIN 300 MG PO CAPS
300.0000 mg | ORAL_CAPSULE | Freq: Every day | ORAL | Status: DC
  Administered 2019-09-06 – 2019-09-08 (×3): 300 mg via ORAL
  Filled 2019-09-06 (×3): qty 1

## 2019-09-06 MED ORDER — ONDANSETRON HCL 4 MG/2ML IJ SOLN
4.0000 mg | Freq: Four times a day (QID) | INTRAMUSCULAR | Status: DC | PRN
  Administered 2019-09-06 – 2019-09-08 (×3): 4 mg via INTRAVENOUS
  Filled 2019-09-06 (×3): qty 2

## 2019-09-06 MED ORDER — SODIUM CHLORIDE 0.9 % IV BOLUS
500.0000 mL | Freq: Once | INTRAVENOUS | Status: AC
  Administered 2019-09-06: 500 mL via INTRAVENOUS

## 2019-09-06 NOTE — H&P (Addendum)
[facility] History and Physical  Tyler Alvarez OHY:[id number] DOB: 25-Jul-1975 DOA: 09/05/2019  Referring EDP: [place]  PCP: Daisy Floro, DO   Chief Complaint: Vomiting   HPI: Tyler Alvarez is a 44 y.o. male with PMH of CKD s/p transplant in 2001, tuberous sclerosis, HTN and gout who presented to ER with nausea/vomiting and admitted for AKI on CKD.  Hx provided by patient and patient's mother as patient very groggy during exam due to receiving multiple medications in ED. Since Tuesday, patient started having nausea and vomiting that has persisted since then and he has been unable to take his medications. Reports some epigastric abdominal pain. Mother reports possible low grade temps at home. Denies sick contacts. No bloody emesis. Unable to tolerate PO. Nothing has made nausea better although he had not tried meds prior to arrival; eating makes nausea/vomiting worse. For a few years after transplant, his mom reports frequent admissions for kidney infections but none for several years. Denies headache, dizziness, chills, cough, SOB, chest pain, diarrhea, constipation, dysuria, hematuria, hematochezia, melena, difficulty moving arms/legs, speech difficulty, trouble eating, confusion or any other complaints.  In the ED: Febrile to 100.18F with mild tachyardia otherwise vitals stable on room air. Labs remarkable for: Cr 2.62, WBC 14.3. Lipase, UA, PT, INR, Lactate WNL. Blood cx drawn. CXR without acute disease.  CT Abd/Pelv: 1. Status post bilateral nephrectomy with a left lower renal pelvic transplant. There is mild fat stranding changes surround the renal transplant with diffuse bladder wall thickening. This could be due to infectious or inflammatory process. 2.  Aortic Atherosclerosis (ICD10-I70.0). 3. Diverticulosis without diverticulitis.  Patient was given Tylenol, Benadryl, Fentanyl, Zofran, Phenergan and 1500 mL IVF. EDP requested admission for AKI and continued  nausea/vomiting.   Review of Systems:  All other systems negative unless noted above in HPI.   Past Medical History:  Diagnosis Date  . ADD (attention deficit disorder)   . Anxiety   . Benign skin lesion of neck    left neck cyst  . Chronic kidney disease    s/p transplant 2001  . Family history of adverse reaction to anesthesia    Pt mother stated Father-"I think that he stopped breathing, they called a code but he came through okay"  . GERD (gastroesophageal reflux disease)   . Headache    Migraines  . Heart murmur   . Hypertension   . Immune deficiency disorder ([place])   . Restless leg syndrome   . Seizures ([place])    treated by Dr. Melrose Nakayama, no seizures in years and years  . 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[place])   . Wears glasses    Past Surgical History:  Procedure Laterality Date  . AV FISTULA PLACEMENT    . KIDNEY TRANSPLANT  2001  . MASS EXCISION Left 12/11/2016   Procedure: EXCISION OF BENIGN LESION OF THE NECK WITH LAYERED CLOSURE;  Surgeon: Irene Limbo, MD;  Location: [facility];  Service: Plastics;  Laterality: Left;  Marland Kitchen MASS EXCISION     neck x2  . MASS EXCISION Bilateral 04/01/2018   Procedure: excision benign lesions left neck and right and left cheek ( 4cm, 2cm, 1cm x3), layered closure neck <10cm, layered closure right cheek 1cm;  Surgeon: Irene Limbo, MD;  Location: [facility];  Service: Plastics;  Laterality: Bilateral;  left cheek lesion  . MULTIPLE TOOTH EXTRACTIONS    . NEPHRECTOMY     both removed in 1999, 3 months apart  . REVISON OF ARTERIOVENOUS FISTULA Left 07/16/2014  Procedure: EXCISION ANEURYSMAL AREA OF LEFT ARTERIOVENOUS FISTULA;  Surgeon: Serafina Mitchell, MD;  Location: [facility] OR;  Service: Vascular;  Laterality: Left;   Social History:  reports that he has never smoked. He has never used smokeless tobacco. He reports that he does not drink alcohol or use drugs.  Allergies  Allergen Reactions  . Erythromycin Nausea And Vomiting  . Sulfa Antibiotics Itching and  Nausea And Vomiting    Family History  Problem Relation Age of Onset  . Hypothyroidism Mother   . Cancer Mother 27       cervical  . Hypertension Mother   . COPD Mother   . COPD Father 27       COPD  . Ulcers Father   . COPD Brother   . Heart disease Brother   . Cancer Other     Prior to Admission medications   Medication Sig Start Date End Date Taking? Authorizing Provider  amLODipine (NORVASC) 2.5 MG tablet Take 5 mg by mouth daily.     [provider]  brompheniramine-pseudoephedrine (DIMETAPP) 1-15 MG/5ML ELIX Take 20 mLs by mouth daily as needed for allergies.    [provider]  divalproex (DEPAKOTE) 250 MG DR tablet Take 1 tablet (250 mg total) by mouth 2 (two) times daily. 02/17/19   Daisy Floro, DO  doxycycline (VIBRAMYCIN) 100 MG capsule Take 1 capsule (100 mg total) by mouth 2 (two) times daily. 04/02/18   Vanessa Kick, MD  gabapentin (NEURONTIN) 300 MG capsule TAKE 1 CAPSULE BY MOUTH EVERYDAY AT BEDTIME 02/17/19   Milus Banister C, DO  imipramine (TOFRANIL) 25 MG tablet Take 50 mg by mouth at bedtime.     [provider]  omeprazole (PRILOSEC) 20 MG capsule Take 20 mg by mouth daily.    [provider]  predniSONE (DELTASONE) 5 MG tablet Take 5 mg by mouth daily.     [provider]  tacrolimus (PROGRAF) 1 MG capsule Take 2 mg by mouth 2 (two) times daily.     [provider]  divalproex (DEPAKOTE) 250 MG DR tablet TAKE 1 TABLET BY MOUTH TWICE A DAY 08/12/18   Diallo, Abdoulaye, MD  gabapentin (NEURONTIN) 300 MG capsule TAKE 1 CAPSULE BY MOUTH EVERYDAY AT BEDTIME 08/12/18   Marjie Skiff, MD   Physical Exam: Vitals:   09/06/19 0204 09/06/19 0210 09/06/19 0242 09/06/19 0500  BP:   (!) 132/91 130/79  Pulse: 88 88 (!) 104 79  Resp: 20 (!) _0 Temp:      TempSrc:      SpO2: 97% 96% 97% 94%  Weight:      Height:        Wt Readings from Last 3 Encounters:  09/05/19 65.8 kg  04/01/18 65.8 kg    10/05/17 64.9 kg    . General:  Appears calm and comfortable. AAOx4 but extremely groggy during my exam likely due to several medications given.  . Eyes: EOMI, normal lids, irises & conjunctiva . ENT: grossly normal hearing, lips & tongue . Neck: normal ROM . Cardiovascular: RRR, no m/r/g. No LE edema. Marland Kitchen Respiratory: CTA bilaterally, no w/r/r. Normal respiratory effort. . Abdomen: soft, mild generalized tenderness to palpation; healed surgical scars noted . Skin: no rash or induration seen on limited exam . Musculoskeletal: grossly normal tone BUE/BLE . Psychiatric: grossly normal mood and affect, speech slurred likely due to medications . Neurologic: grossly non-focal.          Labs on  Admission:  Basic Metabolic Panel: Recent Labs  Lab 09/05/19 1623  NA 137  K 4.7  CL 107  CO2 20*  GLUCOSE 126*  BUN 30*  CREATININE 2.62*  CALCIUM 9.8   Liver Function Tests: Recent Labs  Lab 09/05/19 1623  AST 15  ALT 20  ALKPHOS 69  BILITOT 1.0  PROT 8.0  ALBUMIN 4.2   Recent Labs  Lab 09/05/19 1623  LIPASE 44   No results for input(s): AMMONIA in the last 168 hours. CBC: Recent Labs  Lab 09/05/19 1623  WBC 14.3*  HGB 13.9  HCT 41.1  MCV 90.1  PLT 250   Cardiac Enzymes: No results for input(s): CKTOTAL, CKMB, CKMBINDEX, TROPONINI in the last 168 hours.  BNP (last 3 results) No results for input(s): BNP in the last 8760 hours.  ProBNP (last 3 results) No results for input(s): PROBNP in the last 8760 hours.  CBG: No results for input(s): GLUCAP in the last 168 hours.  Radiological Exams on Admission: CT ABDOMEN PELVIS WO CONTRAST  Result Date: 09/06/2019 CLINICAL DATA:  Nausea vomiting abdominal pain EXAM: CT ABDOMEN AND PELVIS WITHOUT CONTRAST TECHNIQUE: Multidetector CT imaging of the abdomen and pelvis was performed following the standard protocol without IV contrast. COMPARISON:  None. FINDINGS: Lower chest: The visualized heart size within normal limits.  No pericardial fluid/thickening. No hiatal hernia. Bibasilar centrilobular cystic changes are seen. Hepatobiliary: Although limited due to the lack of intravenous contrast, normal in appearance without gross focal abnormality. Layering calcified gallstones are present. Pancreas:  Unremarkable.  No surrounding inflammatory changes. Spleen: Normal in size. Although limited due to the lack of intravenous contrast, normal in appearance. Adrenals/Urinary Tract: Both adrenal glands appear normal. The patient has had a bilateral nephrectomy. A left lower pelvic renal transplant is present. There is a 1.8 cm low-density lesion within the lower pole of the left renal transplant. There is mild fat stranding changes seen around the renal transplant. diffuse mild bladder wall thickening is noted. Surgical clips are seen within the left lower pelvis and right lower pelvis. Stomach/Bowel: The stomach, small bowel, and colon are normal in appearance. No inflammatory changes or obstructive findings. Scattered colonic diverticula are noted. The appendix is unremarkable. Vascular/Lymphatic: There are no enlarged abdominal or pelvic lymph nodes. Scattered aortic atherosclerotic calcifications are seen without aneurysmal dilatation. Reproductive: The prostate is unremarkable. Other: No evidence of abdominal wall mass or hernia. Musculoskeletal: No acute or significant osseous findings. IMPRESSION: 1. Status post bilateral nephrectomy with a left lower renal pelvic transplant. There is mild fat stranding changes surround the renal transplant with diffuse bladder wall thickening. This could be due to infectious or inflammatory process. 2.  Aortic Atherosclerosis (ICD10-I70.0). 3. Diverticulosis without diverticulitis. Electronically Signed   By: Prudencio Pair M.D.   On: 09/06/2019 04:02   DG Chest Port 1 View  Result Date: 09/06/2019 CLINICAL DATA:  Fever nausea vomiting for 2 days EXAM: PORTABLE CHEST 1 VIEW COMPARISON:  None.  FINDINGS: The heart size and mediastinal contours are within normal limits. Aortic knob calcifications are present. Both lungs are clear. The visualized skeletal structures are unremarkable. IMPRESSION: No active disease. Electronically Signed   By: Prudencio Pair M.D.   On: 09/06/2019 01:39    EKG: Independently reviewed. HR 90. NSR. QTc 462. No STEMI.   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 male with PMH of CKD s/p transplant in 2001, tuberous sclerosis, HTN and gout who presented to ER with nausea/vomiting and admitted for AKI on CKD.  AKI on CKD Stage III  Hx of Transplant  Nausea/Vomiting - Cr 2.62 on admission; baseline ~ 1.5 on labs here - likely in setting of poor PO intake and losses from vomiting - Ur Na/Cr ordered on admission - Renal transplant in 2001 due to renal cell carcinoma - Per Care Everywhere, last seen by [facility] Aug 2020: baseline Cr +/-2.0, minimal proteinuria, no donor specific HLA AB detected - Immunosuppression: [place] 2 mg daily, Prednisone 5 mg daily (has not taken for past 4 days) - Prograf level pending  - Zofran - IVF - Unclear picture of infectious etiology but Rocephin ordered on admission in setting of immunosuppression with leukocytosis, tachycardia and fever; patient with hx of UTI and although UA looks clean, CT Abd with fat stranding surrounding renal transplant and bladder wall thickening-will presume cystitis at this time - F/u Urine and Blood cx - regular diet as tolerated  - Nephrology: [facility]   HTN - cont PTA Losartan - Blood pressure goal < 130/80 per Renal   Code Status: Full  DVT Prophylaxis: Lovenox; CrCl > 30 Family Communication: Mother at bedside Disposition Plan: Admit to inpatient. Patient requiring IVF, IV abx and  monitoring of kidney function. Patient is at high risk for further decompensation due to co-morbidities. Anticipate discharge in 2-3 days. Currently unable to tolerate PO.   Time spent: 70 minutes  Chauncey Mann, MD [facility] Pager [phone]

## 2019-09-06 NOTE — Progress Notes (Signed)
   09/06/19 0702  Vitals  Temp 98.4 F (36.9 C)  Temp Source Oral  BP 123/72  MAP (mmHg) 87  BP Location Right Arm  BP Method Automatic  Patient Position (if appropriate) Lying  Pulse Rate 88  Resp 15  Oxygen Therapy  SpO2 94 %  O2 Device Room Air  MEWS Score  MEWS Temp 0  MEWS Systolic 0  MEWS Pulse 0  MEWS RR 0  MEWS LOC 0  MEWS Score 0  MEWS Score Color Green   Patient arrived to unit via stretcher, accompanied with patient's mum. Unable to orient patient to his room due to the fact that patient is drowsy. Patient's mum informed of visitation hours and designated visitor policies. She verbalizes understanding. Bed alarm activated. Patient denies pain at this time.Will give report to incoming nurse

## 2019-09-06 NOTE — Progress Notes (Addendum)
Same day note  Patient seen and examined at bedside.  Patient's mother at bedside.  Patient was admitted to the hospital for nausea vomiting dehydration and acute kidney injury  At the time of my evaluation, patient complains of better.  No active nausea vomiting but was feeling sleepy after nausea medicine.  Physical examination reveals male's mildly somnolent.  Oral cavity dry.  Chest clear.  Abdomen soft nontender.  Moving all the extremities.  Laboratory data and imaging was reviewed  Assessment and Plan.  Nausea, vomiting. We will continue supportive care.  IV fluid hydration.  Lipase of 44.  Acute kidney injury on CKD Stage III,Hx of renal transplant . Likely secondary to nausea vomiting. Cr 2.62 on admission; baseline ~ 1.5.  Urinary sodium was 35.  Urinary creatinine of 399.  Fina less than 1.  Likely volume depletion.  Continue IV fluid hydration for now.  Continue tacrolimus and prednisone.  Continue Zofran.  Check Prograf level.  Possible cystitis.  On the background of immune suppression.  As evidenced by leukocytosis, tachycardia and fever.  History of UTI.  CT scan of the abdomen with fat stranding surrounding renal transplant and bladder wall thickening.  Possible acute cystitis.  Follow blood culture.  Continue IV Rocephin for now.  Patient follows up with [facility] as outpatient.  Lactate of 0.9.  COVID-19 was negative.  ED urinalysis was unremarkable.  Hypomagnesemia.  Will replace.  Check levels in a.m.   HTN Patient is on losartan as outpatient.  Will hold for now.  Closely monitor.  Add hydralazine as needed  History of gout/tuberous sclerosis  Family communication.  Spoke with the patient's mother at bedside. No Charge  Signed,  Delila Pereyra, MD [facility]

## 2019-09-06 NOTE — Plan of Care (Signed)

## 2019-09-06 NOTE — ED Notes (Signed)
RN Eugene Garnet informed pt POC covid test neg

## 2019-09-06 NOTE — ED Provider Notes (Signed)
[facility] EMERGENCY DEPARTMENT Provider Note   CSN: [id number] Arrival date & time: 09/05/19  1613     History Chief Complaint  Patient presents with  . Emesis    Tyler Alvarez is a 44 y.o. male with a hx of anxiety, CKD status post transplant in 2001 at [facility], GERD, hypertension, immune deficiency disorder, seizures, tuberous sclerosis presents to the Emergency Department complaining of gradual, persistent, progressively worsening nausea, vomiting and generalized abdominal pain onset 3 days ago.  Patient reports low-grade fevers at home measured orally.  He reports emesis has been nonbloody nonbilious however he has only been dry heaving for the last 36 hours.  He reports difficulty sleeping due to the persistent vomiting.  No diarrhea.  Additionally, patient reports he has been unable to keep down any of his medications.  He reports his mouth is dry.  No treatments prior to arrival.  Eating or drinking anything induces vomiting.  Nothing makes his symptoms better.  No known sick contacts.  Patient has successfully completed 2 doses of his Covid vaccine.  PCP: none Nephrology: [facility]  The history is provided by the patient and medical records. No language interpreter was used.       Past Medical History:  Diagnosis Date  . ADD (attention deficit disorder)   . Anxiety   . Benign skin lesion of neck    left neck cyst  . Chronic kidney disease    s/p transplant 2001  . Family history of adverse reaction to anesthesia    Pt mother stated Father-"I think that he stopped breathing, they called a code but he came through okay"  . GERD (gastroesophageal reflux disease)   . Headache    Migraines  . Heart murmur   . Hypertension   . Immune deficiency disorder ([place]   . Restless leg syndrome   . Seizures ([place])    treated by Dr. Melrose Nakayama, no seizures in years and years  . 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[place])   . Wears glasses     Patient Active Problem List   Diagnosis Date Noted  . Gout 10/09/2017  . CKD (chronic kidney disease), stage III 10/09/2017  . Normocytic anemia   . Renal transplant, status post   . Essential hypertension   . Aneurysm ([place]) 07/16/2014  . 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[place]) 04/05/2012  . HEARING LOSS, LEFT EAR 11/25/2008    Past Surgical History:  Procedure Laterality Date  . AV FISTULA PLACEMENT    . KIDNEY TRANSPLANT  2001  . MASS EXCISION Left 12/11/2016   Procedure: EXCISION OF BENIGN LESION OF THE NECK WITH LAYERED CLOSURE;  Surgeon: Irene Limbo, MD;  Location: [facility];  Service: Plastics;  Laterality: Left;  Marland Kitchen MASS EXCISION     neck x2  . MASS EXCISION Bilateral 04/01/2018   Procedure: excision benign lesions left neck and right and left cheek ( 4cm, 2cm, 1cm x3), layered closure neck <10cm, layered closure right cheek 1cm;  Surgeon: Irene Limbo, MD;  Location: [facility];  Service: Plastics;  Laterality: Bilateral;  left cheek lesion  . MULTIPLE TOOTH EXTRACTIONS    . NEPHRECTOMY     both removed in 1999, 3 months apart  . REVISON OF ARTERIOVENOUS FISTULA Left 07/16/2014   Procedure: EXCISION ANEURYSMAL AREA OF LEFT ARTERIOVENOUS FISTULA;  Surgeon: Serafina Mitchell, MD;  Location: [facility];  Service: Vascular;  Laterality: Left;       Family History  Problem Relation Age of Onset  . Hypothyroidism Mother   .  Cancer Mother 35       cervical  . Hypertension Mother   . COPD Mother   . COPD Father 90       COPD  . Ulcers Father   . COPD Brother   . Heart disease Brother   . Cancer Other     Social History   Tobacco Use  . Smoking status: Never Smoker  . Smokeless tobacco: Never Used  Substance Use Topics  . Alcohol use: No  . Drug use: No    Home Medications Prior to Admission medications   Medication Sig Start Date End Date Taking? Authorizing Provider  acetaminophen (TYLENOL) 500 MG tablet Take 500 mg by mouth daily as needed for mild pain or headache.   Yes [provider]  aspirin EC  81 MG tablet Take 81 mg by mouth daily.   Yes [provider]  brompheniramine-pseudoephedrine (DIMETAPP) 1-15 MG/5ML ELIX Take 20 mLs by mouth daily as needed for allergies.   Yes [provider]  divalproex (DEPAKOTE) 250 MG DR tablet Take 1 tablet (250 mg total) by mouth 2 (two) times daily. Patient taking differently: Take 500 mg by mouth daily.  02/17/19  Yes Milus Banister C, DO  gabapentin (NEURONTIN) 300 MG capsule TAKE 1 CAPSULE BY MOUTH EVERYDAY AT BEDTIME Patient taking differently: Take 300 mg by mouth at bedtime.  02/17/19  Yes Milus Banister C, DO  imipramine (TOFRANIL) 25 MG tablet Take 50 mg by mouth at bedtime.    Yes [provider]  losartan (COZAAR) 50 MG tablet Take 50 mg by mouth 2 (two) times daily. 06/21/19  Yes [provider]  omeprazole (PRILOSEC) 20 MG capsule Take 20 mg by mouth daily.   Yes [provider]  predniSONE (DELTASONE) 5 MG tablet Take 5 mg by mouth daily.    Yes [provider]  tacrolimus (PROGRAF) 1 MG capsule Take 2 mg by mouth 2 (two) times daily.    Yes [provider]  doxycycline (VIBRAMYCIN) 100 MG capsule Take 1 capsule (100 mg total) by mouth 2 (two) times daily. Patient not taking: Reported on 09/06/2019 04/02/18   Vanessa Kick, MD  divalproex (DEPAKOTE) 250 MG DR tablet TAKE 1 TABLET BY MOUTH TWICE A DAY 08/12/18   Diallo, Abdoulaye, MD  gabapentin (NEURONTIN) 300 MG capsule TAKE 1 CAPSULE BY MOUTH EVERYDAY AT BEDTIME 08/12/18   Diallo, Abdoulaye, MD    Allergies    Erythromycin and Sulfa antibiotics  Review of Systems   Review of Systems  Constitutional: Positive for fatigue and fever. Negative for appetite change, diaphoresis and unexpected weight change.  HENT: Negative for mouth sores.   Eyes: Negative for visual disturbance.  Respiratory: Negative for cough, chest tightness, shortness of breath and wheezing.   Cardiovascular: Negative for chest pain.  Gastrointestinal:  Positive for abdominal pain, nausea and vomiting. Negative for constipation and diarrhea.  Endocrine: Negative for polydipsia, polyphagia and polyuria.  Genitourinary: Negative for dysuria, frequency, hematuria and urgency.  Musculoskeletal: Negative for back pain and neck stiffness.  Skin: Negative for rash.  Allergic/Immunologic: Negative for immunocompromised state.  Neurological: Negative for syncope, light-headedness and headaches.  Hematological: Does not bruise/bleed easily.  Psychiatric/Behavioral: Negative for sleep disturbance. The patient is not nervous/anxious.     Physical Exam Updated Vital Signs BP 118/85 (BP Location: Right Arm)   Pulse 100   Temp (!) 100.5 F (38.1 C) (Rectal)   Resp 16   Ht 6' (1.829 m)   Wt 65.8  kg   SpO2 97%   BMI 19.67 kg/m   Physical Exam Vitals and nursing note reviewed.  Constitutional:      General: He is not in acute distress.    Appearance: He is not diaphoretic.  HENT:     Head: Normocephalic.     Comments: Red, scaling rash noted to the entirety of the face and around the eyes.  Mother reports this is not normal for patient.  Patient reports it is itchy.  No difficulty breathing or swallowing.    Mouth/Throat:     Mouth: Mucous membranes are dry.  Eyes:     General: No scleral icterus.    Conjunctiva/sclera: Conjunctivae normal.  Cardiovascular:     Rate and Rhythm: Regular rhythm. Tachycardia present.     Pulses: Normal pulses.          Radial pulses are 2+ on the right side and 2+ on the left side.  Pulmonary:     Effort: No tachypnea, accessory muscle usage, prolonged expiration, respiratory distress or retractions.     Breath sounds: No stridor.     Comments: Equal chest rise. No increased work of breathing. Abdominal:     General: There is no distension.     Palpations: Abdomen is soft.     Tenderness: There is generalized abdominal tenderness. There is no guarding or rebound.     Comments: Several well-healed  surgical incisions.  Generalized tenderness without guarding or rebound.  Abdomen is soft and nondistended.  Musculoskeletal:     Cervical back: Normal range of motion.     Comments: Moves all extremities equally and without difficulty.  Skin:    General: Skin is warm and dry.     Capillary Refill: Capillary refill takes less than 2 seconds.     Comments: Hot to touch.  No rash on chest, abdomen, arms or legs.  Neurological:     Mental Status: He is alert.     GCS: GCS eye subscore is 4. GCS verbal subscore is 5. GCS motor subscore is 6.     Comments: Speech is clear and goal oriented.  Psychiatric:        Mood and Affect: Mood normal.     ED Results / Procedures / Treatments   Labs (all labs ordered are listed, but only abnormal results are displayed) Labs Reviewed  COMPREHENSIVE METABOLIC PANEL - Abnormal; Notable for the following components:      Result Value   CO2 20 (*)    Glucose, Bld 126 (*)    BUN 30 (*)    Creatinine, Ser 2.62 (*)    GFR calc non Af Amer 28 (*)    GFR calc Af Amer 33 (*)    All other components within normal limits  CBC - Abnormal; Notable for the following components:   WBC 14.3 (*)    All other components within normal limits  URINALYSIS, ROUTINE W REFLEX MICROSCOPIC - Abnormal; Notable for the following components:   APPearance HAZY (*)    Ketones, ur 5 (*)    Protein, ur >=300 (*)    All other components within normal limits  CULTURE, BLOOD (ROUTINE X 2)  CULTURE, BLOOD (ROUTINE X 2)  URINE CULTURE  LIPASE, BLOOD  LACTIC ACID, PLASMA  APTT  PROTIME-INR  LACTIC ACID, PLASMA  TACROLIMUS LEVEL  POC SARS CORONAVIRUS 2 AG -  ED    EKG EKG Interpretation  Date/Time:  Saturday September 06 2019 01:28:12 EDT Ventricular Rate:  93  PR Interval:    QRS Duration: 85 QT Interval:  371 QTC Calculation: 462 R Axis:   67 Text Interpretation: Sinus rhythm Probable left atrial enlargement Repol abnrm suggests ischemia, anterolateral Baseline wander  in lead(s) III Confirmed by Randal Buba, April ([id number]) on 09/06/2019 1:33:31 AM   Radiology CT ABDOMEN PELVIS WO CONTRAST  Result Date: 09/06/2019 CLINICAL DATA:  Nausea vomiting abdominal pain EXAM: CT ABDOMEN AND PELVIS WITHOUT CONTRAST TECHNIQUE: Multidetector CT imaging of the abdomen and pelvis was performed following the standard protocol without IV contrast. COMPARISON:  None. FINDINGS: Lower chest: The visualized heart size within normal limits. No pericardial fluid/thickening. No hiatal hernia. Bibasilar centrilobular cystic changes are seen. Hepatobiliary: Although limited due to the lack of intravenous contrast, normal in appearance without gross focal abnormality. Layering calcified gallstones are present. Pancreas:  Unremarkable.  No surrounding inflammatory changes. Spleen: Normal in size. Although limited due to the lack of intravenous contrast, normal in appearance. Adrenals/Urinary Tract: Both adrenal glands appear normal. The patient has had a bilateral nephrectomy. A left lower pelvic renal transplant is present. There is a 1.8 cm low-density lesion within the lower pole of the left renal transplant. There is mild fat stranding changes seen around the renal transplant. diffuse mild bladder wall thickening is noted. Surgical clips are seen within the left lower pelvis and right lower pelvis. Stomach/Bowel: The stomach, small bowel, and colon are normal in appearance. No inflammatory changes or obstructive findings. Scattered colonic diverticula are noted. The appendix is unremarkable. Vascular/Lymphatic: There are no enlarged abdominal or pelvic lymph nodes. Scattered aortic atherosclerotic calcifications are seen without aneurysmal dilatation. Reproductive: The prostate is unremarkable. Other: No evidence of abdominal wall mass or hernia. Musculoskeletal: No acute or significant osseous findings. IMPRESSION: 1. Status post bilateral nephrectomy with a left lower renal pelvic transplant. There is  mild fat stranding changes surround the renal transplant with diffuse bladder wall thickening. This could be due to infectious or inflammatory process. 2.  Aortic Atherosclerosis (ICD10-I70.0). 3. Diverticulosis without diverticulitis. Electronically Signed   By: Prudencio Pair M.D.   On: 09/06/2019 04:02   DG Chest Port 1 View  Result Date: 09/06/2019 CLINICAL DATA:  Fever nausea vomiting for 2 days EXAM: PORTABLE CHEST 1 VIEW COMPARISON:  None. FINDINGS: The heart size and mediastinal contours are within normal limits. Aortic knob calcifications are present. Both lungs are clear. The visualized skeletal structures are unremarkable. IMPRESSION: No active disease. Electronically Signed   By: Prudencio Pair M.D.   On: 09/06/2019 01:39    Procedures Procedures (including critical care time)  Medications Ordered in ED Medications  sodium chloride flush (NS) 0.9 % injection 3 mL (has no administration in time range)  sodium chloride 0.9 % bolus 1,000 mL (0 mLs Intravenous Stopped 09/06/19 0246)  ondansetron (ZOFRAN) injection 4 mg (4 mg Intravenous Given 09/06/19 0115)  diphenhydrAMINE (BENADRYL) injection 25 mg (25 mg Intravenous Given 09/06/19 0115)  acetaminophen (TYLENOL) tablet 650 mg (650 mg Oral Given 09/06/19 0242)  fentaNYL (SUBLIMAZE) injection 50 mcg (50 mcg Intravenous Given 09/06/19 0459)  promethazine (PHENERGAN) injection 25 mg (25 mg Intravenous Given 09/06/19 0459)  sodium chloride 0.9 % bolus 500 mL (500 mLs Intravenous New Bag/Given 09/06/19 0458)    ED Course  I have reviewed the triage vital signs and the nursing notes.  Pertinent labs & imaging results that were available during my care of the patient were reviewed by me and considered in my medical decision making (see chart for details).  MDM Rules/Calculators/A&P                       Patient with history of kidney transplant presents with nausea and vomiting.  Clinically he is dehydrated.  Labs show AKI.  He is febrile  here in the emergency department.  Fluids given.  Additional labs including cultures pending.  Acetaminophen given for fever.  Patient tachypneic.  Will need to be admitted.  4:45 AM She continues to have mild tachycardia, moderate abdominal pain, nausea and intermittent dry heaves.  CT scan shows inflammation and stranding around patient's kidney.  UA does not appear to have infection, however urine culture was sent.  Patient is not septic though he was febrile.  Lactic acid within normal limits.  Antibiotics held at this time.  BP (!) 132/91   Pulse (!) 104   Temp (!) 100.5 F (38.1 C) (Rectal)   Resp 19   Ht 6' (1.829 m)   Wt 65.8 kg   SpO2 97%   BMI 19.67 kg/m   5:15 AM Discussed patient's case with hospitalist, Dr. Marice Potter.  I have recommended admission and patient (and family if present) agree with this plan. Admitting physician will place admission orders.   Final Clinical Impression(s) / ED Diagnoses Final diagnoses:  Intractable vomiting with nausea, unspecified vomiting type  AKI (acute kidney injury) ([place])  Fever, unspecified fever cause  Generalized abdominal pain    Rx / DC Orders ED Discharge Orders    None       Roselinda Bahena, Gwenlyn Perking 09/06/19 0515    Palumbo, April, MD 09/06/19 [id number]

## 2019-09-06 NOTE — Plan of Care (Signed)
  Problem: Education: Goal: Knowledge of General Education information will improve Description Including pain rating scale, medication(s)/side effects and non-pharmacologic comfort measures Outcome: Progressing   

## 2019-09-07 LAB — CBC
HCT: 29.9 % — ABNORMAL LOW (ref 39.0–52.0)
Hemoglobin: 9.9 g/dL — ABNORMAL LOW (ref 13.0–17.0)
MCH: 30 pg (ref 26.0–34.0)
MCHC: 33.1 g/dL (ref 30.0–36.0)
MCV: 90.6 fL (ref 80.0–100.0)
Platelets: 161 10*3/uL (ref 150–400)
RBC: 3.3 MIL/uL — ABNORMAL LOW (ref 4.22–5.81)
RDW: 13.2 % (ref 11.5–15.5)
WBC: 9.4 10*3/uL (ref 4.0–10.5)
nRBC: 0 % (ref 0.0–0.2)

## 2019-09-07 LAB — BASIC METABOLIC PANEL
Anion gap: 6 (ref 5–15)
BUN: 28 mg/dL — ABNORMAL HIGH (ref 6–20)
CO2: 17 mmol/L — ABNORMAL LOW (ref 22–32)
Calcium: 8.2 mg/dL — ABNORMAL LOW (ref 8.9–10.3)
Chloride: 117 mmol/L — ABNORMAL HIGH (ref 98–111)
Creatinine, Ser: 2.5 mg/dL — ABNORMAL HIGH (ref 0.61–1.24)
GFR calc Af Amer: 35 mL/min — ABNORMAL LOW (ref >60)
GFR calc non Af Amer: 30 mL/min — ABNORMAL LOW (ref >60)
Glucose, Bld: 100 mg/dL — ABNORMAL HIGH (ref 70–99)
Potassium: 5.2 mmol/L — ABNORMAL HIGH (ref 3.5–5.1)
Sodium: 140 mmol/L (ref 135–145)

## 2019-09-07 LAB — MAGNESIUM: Magnesium: 2 mg/dL (ref 1.7–2.4)

## 2019-09-07 LAB — PHOSPHORUS: Phosphorus: 2.8 mg/dL (ref 2.5–4.6)

## 2019-09-07 NOTE — Plan of Care (Signed)

## 2019-09-07 NOTE — Progress Notes (Signed)
PROGRESS NOTE  Tyler Alvarez [id number] DOB: 08-14-1975 DOA: 09/05/2019 PCP: Daisy Floro, DO   LOS: 1 day   Brief narrative: As per HPI,   Tyler Alvarez is a 44 y.o. male with PMH of CKD s/p transplant in 2001, tuberous sclerosis, HTN and gout who presented to ER with nausea/vomiting and admitted for AKI on CKD. For a few years after transplant, his mom reports frequent admissions for kidney infections but none for several years.  In the ED: Febrile to 100.48F with mild tachyardia otherwise vitals stable on room air. Labs remarkable for: Cr 2.62, WBC 14.3. Lipase, UA, PT, INR, Lactate WNL. Blood cx drawn. CXR without acute disease. CTT Abd/Pelv with. Status post bilateral nephrectomy with a left lower renal pelvic transplant. There is mild fat stranding changes surround the renal transplant with diffuse bladder wall thickening. This could be due to infectious or inflammatory process. Patient was given Tylenol, Benadryl, Fentanyl, Zofran, Phenergan and 1500 mL IVF. EDP requested admission for AKI and continued nausea/vomiting.   Assessment/Plan:  Principal Problem:   Acute-on-chronic kidney injury ([place]) Active Problems:   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[place])   Essential hypertension   Renal transplant, status post   Gout   CKD (chronic kidney disease), stage III   Nausea, vomiting. Improved at this time. We will continue supportive care.  Continue IV fluid hydration.  Lipase of 44.  Continue hydration for now.  Acute kidney injury on CKD Stage III,Hx of renal transplant .  Mild metabolic acidosis. Likely secondary to nausea vomiting and volume depletion. Cr 2.62 on admission; creatinine of 2.5 today.  Baseline ~ 1.5.  urinary sodium was 35.  Urinary creatinine of 399.  FENa less than 1.  Likely volume depletion.  Continue IV fluid hydration for now.  Continue tacrolimus and prednisone.  Continue Zofran.  Check Prograf level, pending  Borderline hyperkalemia.  We will continue to  monitor closely.  Off losartan.   Possible cystitis.  On the background of immune suppression.  As evidenced by leukocytosis, tachycardia and fever.  History of UTI.  CT scan of the abdomen with fat stranding surrounding renal transplant and bladder wall thickening.  Possible acute cystitis.  Follow blood culture.  Continue IV Rocephin for now.  Patient follows up with [facility] as outpatient.  Lactate of 0.9.  COVID-19 was negative.    Hypomagnesemia.  Improved with replacement.  Magnesium of 2.0 today   HTN Patient is on losartan as outpatient.  Will hold for now.  Closely monitor.  Add hydralazine as needed.  Blood pressure is stable at this time.  History of gout/tuberous sclerosis continue supportive care  VTE Prophylaxis: Lovenox subcu  Code Status: Full code  Family Communication: None today.  Spoke with the patient's mother yesterday.  Status is: Inpatient  Remains inpatient appropriate because:Inpatient level of care appropriate due to severity of illness, acute kidney injury on IV fluid hydration   Dispo: The patient is from: Home              Anticipated d/c is to: Home              Anticipated d/c date is: 2 days              Patient currently is not medically stable to d/c.  Consultants:  None  Procedures:  None  Antibiotics:  . Rocephin IV  Anti-infectives (From admission, onward)   Start     Dose/Rate Route Frequency Ordered Stop   09/06/19  0600  cefTRIAXone (ROCEPHIN) 1 g in sodium chloride 0.9 % 100 mL IVPB     1 g 200 mL/hr over 30 Minutes Intravenous Every 24 hours 09/06/19 0548       Subjective: Today, patient was seen and examined at bedside.  Denies any active nausea or vomiting today.  No abdominal pain, shortness of breath, cough or fever.  Objective: Vitals:   09/07/19 0402 09/07/19 0815  BP: 121/81 132/80  Pulse: 79 86  Resp: 16 18  Temp: 98.2 F (36.8 C) 98.7 F (37.1 C)  SpO2: 96% 97%    Intake/Output Summary (Last 24  hours) at 09/07/2019 0910 Last data filed at 09/07/2019 0500 Gross per 24 hour  Intake 1006.2 ml  Output 3 ml  Net 1003.2 ml   Filed Weights   09/05/19 1619  Weight: 65.8 kg   Body mass index is 19.67 kg/m.   Physical Exam:  GENERAL: Patient is alert awake and oriented. Not in obvious distress. HENT: No scleral pallor or icterus. Pupils equally reactive to light. Oral mucosa is moist NECK: is supple, no gross swelling noted. CHEST: Clear to auscultation. No crackles or wheezes.  Diminished breath sounds bilaterally. CVS: S1 and S2 heard, no murmur. Regular rate and rhythm.  ABDOMEN: Soft, non-tender, bowel sounds are present.  Healed surgical scar noted EXTREMITIES: No edema. CNS: Cranial nerves are intact. No focal motor deficits. SKIN: warm and dry without rashes.  Data Review: I have personally reviewed the following laboratory data and studies,  CBC: Recent Labs  Lab 09/05/19 1623 09/06/19 0627 09/07/19 0611  WBC 14.3* 10.7* 9.4  HGB 13.9 11.7* 9.9*  HCT 41.1 36.1* 29.9*  MCV 90.1 92.8 90.6  PLT 250 177 [id number]   Basic Metabolic Panel: Recent Labs  Lab 09/05/19 1623 09/06/19 0627 09/06/19 0915 09/07/19 0611  NA 137  --  139 140  K 4.7  --  5.2* 5.2*  CL 107  --  113* 117*  CO2 20*  --  17* 17*  GLUCOSE 126*  --  97 100*  BUN 30*  --  31* 28*  CREATININE 2.62*  --  2.51* 2.50*  CALCIUM 9.8  --  8.1* 8.2*  MG  --  1.5*  --  2.0  PHOS  --  3.3  --  2.8   Liver Function Tests: Recent Labs  Lab 09/05/19 1623  AST 15  ALT 20  ALKPHOS 69  BILITOT 1.0  PROT 8.0  ALBUMIN 4.2   Recent Labs  Lab 09/05/19 1623  LIPASE 44   No results for input(s): AMMONIA in the last 168 hours. Cardiac Enzymes: No results for input(s): CKTOTAL, CKMB, CKMBINDEX, TROPONINI in the last 168 hours. BNP (last 3 results) No results for input(s): BNP in the last 8760 hours.  ProBNP (last 3 results) No results for input(s): PROBNP in the last 8760 hours.  CBG: No results  for input(s): GLUCAP in the last 168 hours. Recent Results (from the past 240 hour(s))  SARS CORONAVIRUS 2 (TAT 6-24 HRS) Nasopharyngeal Nasopharyngeal Swab     Status: None   Collection Time: 09/06/19  6:27 AM   Specimen: Nasopharyngeal Swab  Result Value Ref Range Status   SARS Coronavirus 2 NEGATIVE NEGATIVE Final    Comment: (NOTE) SARS-CoV-2 target nucleic acids are NOT DETECTED. The SARS-CoV-2 RNA is generally detectable in upper and lower respiratory specimens during the acute phase of infection. Negative results do not preclude SARS-CoV-2 infection, do not rule out co-infections with other  pathogens, and should not be used as the sole basis for treatment or other patient management decisions. Negative results must be combined with clinical observations, patient history, and epidemiological information. The expected result is Negative. Fact Sheet for Patients: SugarRoll.be Fact Sheet for Healthcare Providers: https://www.woods-mathews.com/ This test is not yet approved or cleared by the [place] FDA and  has been authorized for detection and/or diagnosis of SARS-CoV-2 by FDA under an Emergency Use Authorization (EUA). This EUA will remain  in effect (meaning this test can be used) for the duration of the COVID-19 declaration under Section 56 4(b)(1) of the Act, 21 U.S.C. section 360bbb-3(b)(1), unless the authorization is terminated or revoked sooner. Performed at [facility], [place] [address] [place], [place] [address]      Studies: CT ABDOMEN PELVIS WO CONTRAST  Result Date: 09/06/2019 CLINICAL DATA:  Nausea vomiting abdominal pain EXAM: CT ABDOMEN AND PELVIS WITHOUT CONTRAST TECHNIQUE: Multidetector CT imaging of the abdomen and pelvis was performed following the standard protocol without IV contrast. COMPARISON:  None. FINDINGS: Lower chest: The visualized heart size within normal limits. No pericardial  fluid/thickening. No hiatal hernia. Bibasilar centrilobular cystic changes are seen. Hepatobiliary: Although limited due to the lack of intravenous contrast, normal in appearance without gross focal abnormality. Layering calcified gallstones are present. Pancreas:  Unremarkable.  No surrounding inflammatory changes. Spleen: Normal in size. Although limited due to the lack of intravenous contrast, normal in appearance. Adrenals/Urinary Tract: Both adrenal glands appear normal. The patient has had a bilateral nephrectomy. A left lower pelvic renal transplant is present. There is a 1.8 cm low-density lesion within the lower pole of the left renal transplant. There is mild fat stranding changes seen around the renal transplant. diffuse mild bladder wall thickening is noted. Surgical clips are seen within the left lower pelvis and right lower pelvis. Stomach/Bowel: The stomach, small bowel, and colon are normal in appearance. No inflammatory changes or obstructive findings. Scattered colonic diverticula are noted. The appendix is unremarkable. Vascular/Lymphatic: There are no enlarged abdominal or pelvic lymph nodes. Scattered aortic atherosclerotic calcifications are seen without aneurysmal dilatation. Reproductive: The prostate is unremarkable. Other: No evidence of abdominal wall mass or hernia. Musculoskeletal: No acute or significant osseous findings. IMPRESSION: 1. Status post bilateral nephrectomy with a left lower renal pelvic transplant. There is mild fat stranding changes surround the renal transplant with diffuse bladder wall thickening. This could be due to infectious or inflammatory process. 2.  Aortic Atherosclerosis (ICD10-I70.0). 3. Diverticulosis without diverticulitis. Electronically Signed   By: Prudencio Pair M.D.   On: 09/06/2019 04:02   DG Chest Port 1 View  Result Date: 09/06/2019 CLINICAL DATA:  Fever nausea vomiting for 2 days EXAM: PORTABLE CHEST 1 VIEW COMPARISON:  None. FINDINGS: The heart  size and mediastinal contours are within normal limits. Aortic knob calcifications are present. Both lungs are clear. The visualized skeletal structures are unremarkable. IMPRESSION: No active disease. Electronically Signed   By: Prudencio Pair M.D.   On: 09/06/2019 01:39      Flora Lipps, MD  [facility] 09/07/2019

## 2019-09-08 LAB — PROTEIN / CREATININE RATIO, URINE
Creatinine, Urine: 25.48 mg/dL
Protein Creatinine Ratio: 0.94 mg/mg{Cre} — ABNORMAL HIGH (ref 0.00–0.15)
Total Protein, Urine: 24 mg/dL

## 2019-09-08 LAB — BASIC METABOLIC PANEL
Anion gap: 5 (ref 5–15)
BUN: 18 mg/dL (ref 6–20)
CO2: 19 mmol/L — ABNORMAL LOW (ref 22–32)
Calcium: 8.3 mg/dL — ABNORMAL LOW (ref 8.9–10.3)
Chloride: 117 mmol/L — ABNORMAL HIGH (ref 98–111)
Creatinine, Ser: 1.88 mg/dL — ABNORMAL HIGH (ref 0.61–1.24)
GFR calc Af Amer: 49 mL/min — ABNORMAL LOW (ref >60)
GFR calc non Af Amer: 42 mL/min — ABNORMAL LOW (ref >60)
Glucose, Bld: 114 mg/dL — ABNORMAL HIGH (ref 70–99)
Potassium: 4.4 mmol/L (ref 3.5–5.1)
Sodium: 141 mmol/L (ref 135–145)

## 2019-09-08 LAB — CBC
HCT: 30.5 % — ABNORMAL LOW (ref 39.0–52.0)
Hemoglobin: 9.9 g/dL — ABNORMAL LOW (ref 13.0–17.0)
MCH: 29.7 pg (ref 26.0–34.0)
MCHC: 32.5 g/dL (ref 30.0–36.0)
MCV: 91.6 fL (ref 80.0–100.0)
Platelets: 165 10*3/uL (ref 150–400)
RBC: 3.33 MIL/uL — ABNORMAL LOW (ref 4.22–5.81)
RDW: 13.4 % (ref 11.5–15.5)
WBC: 7.2 10*3/uL (ref 4.0–10.5)
nRBC: 0 % (ref 0.0–0.2)

## 2019-09-08 LAB — TACROLIMUS LEVEL: Tacrolimus (FK506) - LabCorp: 1.4 ng/mL — ABNORMAL LOW (ref 2.0–20.0)

## 2019-09-08 MED ORDER — TRAMADOL HCL 50 MG PO TABS
50.0000 mg | ORAL_TABLET | Freq: Four times a day (QID) | ORAL | Status: DC | PRN
  Administered 2019-09-08 – 2019-09-09 (×2): 50 mg via ORAL
  Filled 2019-09-08 (×2): qty 1

## 2019-09-08 MED ORDER — PANTOPRAZOLE SODIUM 40 MG PO TBEC
40.0000 mg | DELAYED_RELEASE_TABLET | Freq: Every day | ORAL | Status: DC
  Administered 2019-09-08 – 2019-09-09 (×2): 40 mg via ORAL
  Filled 2019-09-08 (×2): qty 1

## 2019-09-08 MED ORDER — ACETAMINOPHEN 325 MG PO TABS
650.0000 mg | ORAL_TABLET | Freq: Four times a day (QID) | ORAL | Status: DC | PRN
  Administered 2019-09-09: 650 mg via ORAL
  Filled 2019-09-08: qty 2

## 2019-09-08 MED ORDER — AMLODIPINE BESYLATE 5 MG PO TABS
5.0000 mg | ORAL_TABLET | Freq: Every day | ORAL | Status: DC
  Administered 2019-09-08 – 2019-09-09 (×2): 5 mg via ORAL
  Filled 2019-09-08 (×2): qty 1

## 2019-09-08 NOTE — Plan of Care (Signed)

## 2019-09-08 NOTE — Progress Notes (Signed)
PROGRESS NOTE  Tyler Alvarez [id number] DOB: Jan 27, 1976 DOA: 09/05/2019 PCP: Daisy Floro, DO   LOS: 2 days   Brief narrative: As per HPI,   Tyler Alvarez is a 44 y.o. male with PMH of CKD s/p transplant in 2001, tuberous sclerosis, HTN and gout who presented to ER with nausea/vomiting and admitted for AKI on CKD. For a few years after transplant, his mom reports frequent admissions for kidney infections but none for several years.  In the ED: Febrile to 100.16F with mild tachyardia otherwise vitals stable on room air. Labs remarkable for: Cr 2.62, WBC 14.3. Lipase, UA, PT, INR, Lactate WNL. Blood cx drawn. CXR without acute disease. CTT Abd/Pelv with. Status post bilateral nephrectomy with a left lower renal pelvic transplant. There is mild fat stranding changes surround the renal transplant with diffuse bladder wall thickening. This could be due to infectious or inflammatory process. Patient was given Tylenol, Benadryl, Fentanyl, Zofran, Phenergan and 1500 mL IVF. ED provider requested admission for AKI and continued nausea/vomiting.   Assessment/Plan:  Principal Problem:   Acute-on-chronic kidney injury ([place]) Active Problems:   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[place])   Essential hypertension   Renal transplant, status post   Gout   CKD (chronic kidney disease), stage III   Nausea, vomiting. Improved at this time. Mild nausea but tolerating PO. We will continue supportive care.  Continue IV fluid hydration for now. Lipase of 44.  Continue IV hydration for now.  Acute kidney injury on CKD Stage III,Hx of renal transplant .  Mild metabolic acidosis. Likely secondary to nausea, vomiting and volume depletion. Cr 2.62 on admission; creatinine of 1.8 today.  Baseline ~ 1.8-2.0 as per nephrology.  urinary sodium was 35.  Urinary creatinine of 399.  FENa less than 1.  Likely volume depletion.  Continue IV fluid hydration for now.  Continue tacrolimus and prednisone.    Prograf level at 1.4.   Borderline hyperkalemia. Off losartan. Potassium 4.4 today.   Possible cystitis.  On the background of immune suppression.  As evidenced by leukocytosis, tachycardia and fever.  History of UTI.  CT scan of the abdomen with fat stranding surrounding renal transplant and bladder wall thickening.  Possible acute cystitis on IV Rocephin. Lactate of 0.9.  COVID-19 was negative.   Blood cultures negative in 2 days. No leukocytosis. Nephrology recommends oral antibiotic -Cefpodoxime 100 mg p.o. twice daily to complete 10-day course  Hypomagnesemia. Improved.   HTN Patient is on losartan as outpatient.   On hold. Nephrology recommends amlodipine 5 milligrams daily.  Normocytic anemia from chronic kidney disease. Closely monitor as outpatient. No indication for PRBC.  History of gout/tuberous sclerosis continue supportive care  VTE Prophylaxis: Lovenox subcu  Code Status: Full code  Family Communication: I tried to reach the patient's mother on the phone but was unable to reach her today.  Status is: Inpatient  Remains inpatient appropriate because:Inpatient level of care appropriate due to severity of illness, acute kidney injury on IV fluid hydration,   Dispo: The patient is from: Home               Anticipated d/c is to: Home              Anticipated d/c date is: likely in am if renal function stable and improving and ok with nephrology              Patient currently is not medically stable to d/c.  Consultants:  None  Procedures:  None  Antibiotics:  . Rocephin IV  Anti-infectives (From admission, onward)   Start     Dose/Rate Route Frequency Ordered Stop   09/06/19 0600  cefTRIAXone (ROCEPHIN) 1 g in sodium chloride 0.9 % 100 mL IVPB     1 g 200 mL/hr over 30 Minutes Intravenous Every 24 hours 09/06/19 0548       Subjective: Today, patient was seen and examined at bedside. Has mild nausea but no vomiting. Has been tolerating p.o. Wishes to see nephrology for  evaluation. Objective: Vitals:   09/08/19 0751 09/08/19 1337  BP: (!) 155/86 (!) 167/99  Pulse: 90 91  Resp: 16 16  Temp: 98.7 F (37.1 C) 99.1 F (37.3 C)  SpO2: 98% 98%    Intake/Output Summary (Last 24 hours) at 09/08/2019 1517 Last data filed at 09/08/2019 0500 Gross per 24 hour  Intake 240 ml  Output 3 ml  Net 237 ml   Filed Weights   09/05/19 1619  Weight: 65.8 kg   Body mass index is 19.67 kg/m.   Physical Exam:  GENERAL: Patient is alert awake and communicative.. Not in obvious distress. HENT: No scleral pallor or icterus. Pupils equally reactive to light. Oral mucosa is moist NECK: is supple, no gross swelling noted. CHEST: Clear to auscultation. No crackles or wheezes.  Diminished breath sounds bilaterally. CVS: S1 and S2 heard, no murmur. Regular rate and rhythm.  ABDOMEN: Soft, non-tender, bowel sounds are present.  Healed surgical scar noted EXTREMITIES: No edema. CNS: Cranial nerves are intact. No focal motor deficits. SKIN: warm and dry without rashes.  Data Review: I have personally reviewed the following laboratory data and studies,  CBC: Recent Labs  Lab 09/05/19 1623 09/06/19 0627 09/07/19 0611 09/08/19 0725  WBC 14.3* 10.7* 9.4 7.2  HGB 13.9 11.7* 9.9* 9.9*  HCT 41.1 36.1* 29.9* 30.5*  MCV 90.1 92.8 90.6 91.6  PLT 250 177 161 [id number]   Basic Metabolic Panel: Recent Labs  Lab 09/05/19 1623 09/06/19 0627 09/06/19 0915 09/07/19 0611 09/08/19 0725  NA 137  --  139 140 141  K 4.7  --  5.2* 5.2* 4.4  CL 107  --  113* 117* 117*  CO2 20*  --  17* 17* 19*  GLUCOSE 126*  --  97 100* 114*  BUN 30*  --  31* 28* 18  CREATININE 2.62*  --  2.51* 2.50* 1.88*  CALCIUM 9.8  --  8.1* 8.2* 8.3*  MG  --  1.5*  --  2.0  --   PHOS  --  3.3  --  2.8  --    Liver Function Tests: Recent Labs  Lab 09/05/19 1623  AST 15  ALT 20  ALKPHOS 69  BILITOT 1.0  PROT 8.0  ALBUMIN 4.2   Recent Labs  Lab 09/05/19 1623  LIPASE 44   No results for  input(s): AMMONIA in the last 168 hours. Cardiac Enzymes: No results for input(s): CKTOTAL, CKMB, CKMBINDEX, TROPONINI in the last 168 hours. BNP (last 3 results) No results for input(s): BNP in the last 8760 hours.  ProBNP (last 3 results) No results for input(s): PROBNP in the last 8760 hours.  CBG: No results for input(s): GLUCAP in the last 168 hours. Recent Results (from the past 240 hour(s))  Blood Culture (routine x 2)     Status: None (Preliminary result)   Collection Time: 09/06/19  1:16 AM   Specimen: BLOOD RIGHT WRIST  Result Value Ref Range Status   Specimen Description BLOOD RIGHT  WRIST  Final   Special Requests   Final    BOTTLES DRAWN AEROBIC AND ANAEROBIC Blood Culture adequate volume   Culture   Final    NO GROWTH 2 DAYS Performed at [facility], [address], [place], [place] [address]    Report Status PENDING  Incomplete  Blood Culture (routine x 2)     Status: None (Preliminary result)   Collection Time: 09/06/19  1:17 AM   Specimen: BLOOD  Result Value Ref Range Status   Specimen Description BLOOD RIGHT ANTECUBITAL  Final   Special Requests   Final    BOTTLES DRAWN AEROBIC AND ANAEROBIC Blood Culture adequate volume   Culture   Final    NO GROWTH 2 DAYS Performed at [facility], [place] [address] [place], [place] [address]    Report Status PENDING  Incomplete  SARS CORONAVIRUS 2 (TAT 6-24 HRS) Nasopharyngeal Nasopharyngeal Swab     Status: None   Collection Time: 09/06/19  6:27 AM   Specimen: Nasopharyngeal Swab  Result Value Ref Range Status   SARS Coronavirus 2 NEGATIVE NEGATIVE Final    Comment: (NOTE) SARS-CoV-2 target nucleic acids are NOT DETECTED. The SARS-CoV-2 RNA is generally detectable in upper and lower respiratory specimens during the acute phase of infection. Negative results do not preclude SARS-CoV-2 infection, do not rule out co-infections with other pathogens, and should not be used as the sole basis for treatment or  other patient management decisions. Negative results must be combined with clinical observations, patient history, and epidemiological information. The expected result is Negative. Fact Sheet for Patients: SugarRoll.be Fact Sheet for Healthcare Providers: https://www.woods-mathews.com/ This test is not yet approved or cleared by the [place] FDA and  has been authorized for detection and/or diagnosis of SARS-CoV-2 by FDA under an Emergency Use Authorization (EUA). This EUA will remain  in effect (meaning this test can be used) for the duration of the COVID-19 declaration under Section 56 4(b)(1) of the Act, 21 U.S.C. section 360bbb-3(b)(1), unless the authorization is terminated or revoked sooner. Performed at [facility], [place] [address] [place], [place] [address]      Studies: No results found.    Flora Lipps, MD  [facility] 09/08/2019

## 2019-09-08 NOTE — Consult Note (Signed)
Tyler Alvarez Progress Note  DRAVON YOUNKIN Admit Date: 09/05/2019 Requesting Physician: Flora Lipps, MD  PCP: Daisy Floro, DO Reason for Consult:  AKI   Assessment/ Plan:   Assessment & Plan  1. AKI on CKD III-T  Patient's creatinine today is 1.88, BUN 18.  On arrival, Cr elevated at 2.62, BUN 30. At January 2020 appointment with Dr. Joelyn Oms at [facility], patient's creatinine baseline was noted to be 1.8-2.0.  Initial work-up included FENa calculated to be less than 1 and AKI assumed to be prerenal likely due to volume depletion in the setting of nausea vomiting. Given that patient has improved with antibiotics, will continue to treat as cause of AKI.  Patient has been continued on IV fluids and continued home Prograf and prednisone. Patient urinating; outputs not measured. .  Patient has not had any emesis charted during this admission.  He has been eating 100% of his meals in the last 24 hours. -Hold nephrotoxic medications, home losartan -given improvement of creatinine, can consider discontinuing fluids given good p.o. intake -a.m. creatinine -Transition to cefpodoxime PO 100 mg BID for a total 10 day treatment  2. Transplant Pyelonephritis  Bladder wall thickening patient started on IV Rocephin in the ED for possible cystitis in the setting of immunosuppression, leukocytosis, tachycardia, fever, CT scan with bladder wall thickening.  Urine in ED was significant for 5 ketones, greater than 300 protein, hyaline casts, mucus. Denies hematuria, dysuria, urinary frequency. -no urine culture sent  -Continue antibiotics  3. Proteinuria   in 2018, at [facility] transplant visit, patient's UPC was 0.242 with no gross proteinuria. No other recent Urine protein values in chart. Will look in [facility] EMR. In ED, >=300 protein.  No pyuria making infection or interstitial nephritis less likely, no bacteria, erythrocytes, casts, or crystals. Will check UPC today. Consider  orthostatic proteinuria, will follow up with UPC -ordered UPC 4. Hx of renal carcinoma s/p b/l nephrectomy (1999), s/p renal transplant (2001) Continue home prograf and prednisone. Tac level 1.4 -f/u Tac level  -avoid left extremity for blood pressures or labs 5. Hypertension  Placed on losartan out patient. Held for AKI.  -will start amlodipine 5 mg daily  6. Ascending Aortic Aneurysm   Currently followed by [facility] cardiology.  Patient denies any chest pain today.  He is worried that his blood pressures have been elevated as we have been holding his losartan. -Continue to monitor blood pressures 7. Tuberous sclerosis  8. Seizure disorder  -continue Depakote 9. GERD    Medications:   Home Meds:  Current Outpatient Medications  Medication Instructions  . acetaminophen (TYLENOL) 500 mg, Oral, Daily PRN  . aspirin EC 81 mg, Oral, Daily  . brompheniramine-pseudoephedrine (DIMETAPP) 1-15 MG/5ML ELIX 20 mLs, Oral, Daily PRN  . divalproex (DEPAKOTE) 250 mg, Oral, 2 times daily  . doxycycline (VIBRAMYCIN) 100 mg, Oral, 2 times daily  . gabapentin (NEURONTIN) 300 MG capsule TAKE 1 CAPSULE BY MOUTH EVERYDAY AT BEDTIME  . imipramine (TOFRANIL) 50 mg, Oral, Daily at bedtime  . losartan (COZAAR) 50 mg, Oral, 2 times daily  . omeprazole (PRILOSEC) 20 mg, Oral, Daily  . predniSONE (DELTASONE) 5 mg, Oral, Daily  . tacrolimus (PROGRAF) 2 mg, Oral, 2 times daily   Inpatient Meds: Scheduled Meds: . aspirin EC  81 mg Oral Daily  . divalproex  250 mg Oral BID  . enoxaparin (LOVENOX) injection  40 mg Subcutaneous Daily  . gabapentin  300 mg Oral QHS  . pantoprazole  40 mg Oral Daily  . predniSONE  5 mg Oral Q breakfast  . sodium chloride flush  3 mL Intravenous Once  . tacrolimus  2 mg Oral BID   Continuous Infusions: . sodium chloride 125 mL/hr at 09/08/19 0543  . cefTRIAXone (ROCEPHIN)  IV 1 g (09/08/19 0545)   PRN Meds:.hydrALAZINE, ondansetron (ZOFRAN) IV  Subjective:   HPI:  Patient  is a 44 year old male with past medical history significant for tuberous sclerosis with history of renal cell carcinoma, status post bilateral nephrectomy in 1999, status post renal transplant in 2001, who presented to the ED with complaints of nausea vomiting and was admitted for AKI on CKD.  Patient also has history of transplant pyelonephritis, but has not had any episodes in several years. On arrival to ED, patient reported 4 days of intractable nausea NBNB vomiting with epigastric pain that is worsened with eating, low-grade fevers, and unable to take her medications.  In the ED, patient was febrile to 100.5 with mild tachycardia.  Creatinine was 2.62 with white count 14.3.  Lipase and lactate were within normal limits.  Chest x-ray benign and CT abdomen pelvis with mild fat stranding around renal transplant and diffuse bladder wall thickening possibly due to infectious or inflammatory process.  Last visit with TKA on January 2020 with Dr. Joelyn Oms.   Status post LRD.  Primary disease TSC and angiomyolipomas with contained RCC.  Tacrolimus and prednisone.  History of transplant pyelonephritis in 07/2002.  Secondary hyperparathyroidism  Immunosuppression  Hypertension: Amlodipine 2.5 mg, twice daily  Seizure disorder on Depakote  CKD 3T, baseline creatinine 1.8-2.0  3H PTH: Mild, intolerant of cinacalcet, history of hypercalcemia on VDR a, 25 hydroxy vitamin D deficiency but given increase in calcium held over-the-counter vitamin D.  ROS: Patient denies NSAIDs, IV contrast, bactrim.  Balance of 12 systems is negative w/ exceptions as above  PMH  Past Medical History:  Diagnosis Date  . ADD (attention deficit disorder)   . Anxiety   . Benign skin lesion of neck    left neck cyst  . Chronic kidney disease    s/p transplant 2001  . Family history of adverse reaction to anesthesia    Pt mother stated Father-"I think that he stopped breathing, they called a code but he came through okay"   . GERD (gastroesophageal reflux disease)   . Headache    Migraines  . Heart murmur   . Hypertension   . Immune deficiency disorder ([place])   . Restless leg syndrome   . Seizures ([place])    treated by Dr. Melrose Nakayama, no seizures in years and years  . 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[place])   . Wears glasses    PSH  Past Surgical History:  Procedure Laterality Date  . AV FISTULA PLACEMENT    . KIDNEY TRANSPLANT  2001  . MASS EXCISION Left 12/11/2016   Procedure: EXCISION OF BENIGN LESION OF THE NECK WITH LAYERED CLOSURE;  Surgeon: Irene Limbo, MD;  Location: [facility];  Service: Plastics;  Laterality: Left;  Marland Kitchen MASS EXCISION     neck x2  . MASS EXCISION Bilateral 04/01/2018   Procedure: excision benign lesions left neck and right and left cheek ( 4cm, 2cm, 1cm x3), layered closure neck <10cm, layered closure right cheek 1cm;  Surgeon: Irene Limbo, MD;  Location: [facility];  Service: Plastics;  Laterality: Bilateral;  left cheek lesion  . MULTIPLE TOOTH EXTRACTIONS    . NEPHRECTOMY     both removed in 1999, 3 months  apart  . REVISON OF ARTERIOVENOUS FISTULA Left 07/16/2014   Procedure: EXCISION ANEURYSMAL AREA OF LEFT ARTERIOVENOUS FISTULA;  Surgeon: Serafina Mitchell, MD;  Location: [facility];  Service: Vascular;  Laterality: Left;   FH  Family History  Problem Relation Age of Onset  . Hypothyroidism Mother   . Cancer Mother 48       cervical  . Hypertension Mother   . COPD Mother   . COPD Father 80       COPD  . Ulcers Father   . COPD Brother   . Heart disease Brother   . Cancer Other    SH  reports that he has never smoked. He has never used smokeless tobacco. He reports that he does not drink alcohol or use drugs. Allergies  Allergies  Allergen Reactions  . Erythromycin Nausea And Vomiting  . Sulfa Antibiotics Itching and Nausea And Vomiting    Objective:   Physical Exam   BP (!) 155/86 (BP Location: Right Arm)   Pulse 90   Temp 98.7 F (37.1 C) (Oral)   Resp 16   Ht 6' (1.829 m)    Wt 65.8 kg   SpO2 98%   BMI 19.67 kg/m  Filed Weights   09/05/19 1619  Weight: 65.8 kg   GEN: Well appearing male, lying in bed comfortably watching TV. Mom at bedside  CV: 2/6 systolic murmur, S1 S2  PULM: CTAB  ABD: + BS, NTND SKIN: pale, soft, warm  EXT:No BLEE, scar of upper left arm  Labs: CBC Recent Labs  Lab 09/06/19 0627 09/07/19 0611 09/08/19 0725  WBC 10.7* 9.4 7.2  HGB 11.7* 9.9* 9.9*  HCT 36.1* 29.9* 30.5*  MCV 92.8 90.6 91.6  PLT 177 161 [id number]   Basic Metabolic Panel Recent Labs  Lab 09/05/19 1623 09/06/19 0627 09/06/19 0915 09/07/19 0611 09/08/19 0725  NA 137  --  139 140 141  K 4.7  --  5.2* 5.2* 4.4  CL 107  --  113* 117* 117*  CO2 20*  --  17* 17* 19*  GLUCOSE 126*  --  97 100* 114*  BUN 30*  --  31* 28* 18  CREATININE 2.62*  --  2.51* 2.50* 1.88*  CALCIUM 9.8  --  8.1* 8.2* 8.3*  PHOS  --  3.3  --  2.8  --     Creatinine (mg/dL)  Date Value  09/14/2014 1.6 (A)   Creatinine, Ser (mg/dL)  Date Value  09/08/2019 1.88 (H)  09/07/2019 2.50 (H)  09/06/2019 2.51 (H)  09/05/2019 2.62 (H)  04/01/2018 2.09 (H)  12/11/2016 1.91 (H)  07/26/2014 1.62 (H)  07/25/2014 1.45 (H)  07/24/2014 1.37 (H)  07/23/2014 1.33    Pertinent Imaging:  No results found.  Zettie Cooley, MD [facility] Resident, PGY2 09/08/2019, 9:30 AM

## 2019-09-09 LAB — CBC
HCT: 30.3 % — ABNORMAL LOW (ref 39.0–52.0)
Hemoglobin: 10 g/dL — ABNORMAL LOW (ref 13.0–17.0)
MCH: 30.4 pg (ref 26.0–34.0)
MCHC: 33 g/dL (ref 30.0–36.0)
MCV: 92.1 fL (ref 80.0–100.0)
Platelets: 178 10*3/uL (ref 150–400)
RBC: 3.29 MIL/uL — ABNORMAL LOW (ref 4.22–5.81)
RDW: 13.4 % (ref 11.5–15.5)
WBC: 7.4 10*3/uL (ref 4.0–10.5)
nRBC: 0 % (ref 0.0–0.2)

## 2019-09-09 LAB — URINE CULTURE: Culture: NO GROWTH

## 2019-09-09 LAB — BASIC METABOLIC PANEL
Anion gap: 8 (ref 5–15)
BUN: 14 mg/dL (ref 6–20)
CO2: 19 mmol/L — ABNORMAL LOW (ref 22–32)
Calcium: 8.5 mg/dL — ABNORMAL LOW (ref 8.9–10.3)
Chloride: 113 mmol/L — ABNORMAL HIGH (ref 98–111)
Creatinine, Ser: 1.69 mg/dL — ABNORMAL HIGH (ref 0.61–1.24)
GFR calc Af Amer: 56 mL/min — ABNORMAL LOW (ref >60)
GFR calc non Af Amer: 48 mL/min — ABNORMAL LOW (ref >60)
Glucose, Bld: 100 mg/dL — ABNORMAL HIGH (ref 70–99)
Potassium: 4.4 mmol/L (ref 3.5–5.1)
Sodium: 140 mmol/L (ref 135–145)

## 2019-09-09 LAB — MAGNESIUM: Magnesium: 1.2 mg/dL — ABNORMAL LOW (ref 1.7–2.4)

## 2019-09-09 MED ORDER — AMLODIPINE BESYLATE 5 MG PO TABS: 5.0000 mg | ORAL_TABLET | Freq: Every day | ORAL | 2 refills | Status: DC

## 2019-09-09 MED ORDER — CEFPODOXIME PROXETIL 100 MG PO TABS: 100.0000 mg | ORAL_TABLET | Freq: Two times a day (BID) | ORAL | 0 refills | Status: DC

## 2019-09-09 MED ORDER — TACROLIMUS 1 MG PO CAPS: 3.0000 mg | ORAL_CAPSULE | Freq: Two times a day (BID) | ORAL | Status: DC

## 2019-09-09 MED ORDER — TACROLIMUS 1 MG PO CAPS
2.0000 mg | ORAL_CAPSULE | Freq: Two times a day (BID) | ORAL | Status: DC
  Filled 2019-09-09: qty 2

## 2019-09-09 MED ORDER — ONDANSETRON HCL 4 MG PO TABS: 4.0000 mg | ORAL_TABLET | Freq: Three times a day (TID) | ORAL | 0 refills | Status: DC | PRN

## 2019-09-09 MED ORDER — TACROLIMUS 1 MG PO CAPS: 3.0000 mg | ORAL_CAPSULE | Freq: Two times a day (BID) | ORAL | 0 refills | Status: DC

## 2019-09-09 NOTE — Care Management Important Message (Signed)
Important Message  Patient Details  Name: Tyler Alvarez MRN: [id number] Date of Birth: 27-Jun-1975   Medicare Important Message Given:  Yes     Bluma Buresh 09/09/2019, 3:12 PM

## 2019-09-09 NOTE — Plan of Care (Signed)
Pt discharging to home. Discharge instructions explained to pt and pt verbalized understanding. All personal belongings returned. No questions or concerns voiced. Awaiting transportation.   Problem: Education: Goal: Knowledge of General Education information will improve Description: Including pain rating scale, medication(s)/side effects and non-pharmacologic comfort measures Outcome: Completed/Met   Problem: Health Behavior/Discharge Planning: Goal: Ability to manage health-related needs will improve Outcome: Completed/Met   Problem: Clinical Measurements: Goal: Ability to maintain clinical measurements within normal limits will improve Outcome: Completed/Met Goal: Will remain free from infection Outcome: Completed/Met Goal: Diagnostic test results will improve Outcome: Completed/Met Goal: Respiratory complications will improve Outcome: Completed/Met Goal: Cardiovascular complication will be avoided Outcome: Completed/Met   Problem: Activity: Goal: Risk for activity intolerance will decrease Outcome: Completed/Met   Problem: Nutrition: Goal: Adequate nutrition will be maintained Outcome: Completed/Met   Problem: Coping: Goal: Level of anxiety will decrease Outcome: Completed/Met   Problem: Elimination: Goal: Will not experience complications related to bowel motility Outcome: Completed/Met Goal: Will not experience complications related to urinary retention Outcome: Completed/Met   Problem: Pain Managment: Goal: General experience of comfort will improve Outcome: Completed/Met   Problem: Safety: Goal: Ability to remain free from injury will improve Outcome: Completed/Met   Problem: Skin Integrity: Goal: Risk for impaired skin integrity will decrease Outcome: Completed/Met

## 2019-09-09 NOTE — Discharge Summary (Signed)
Physician Discharge Summary  MARTHA GUERRY [id number] DOB: 09/11/1975 DOA: 09/05/2019  PCP: Daisy Floro, DO  Admit date: 09/05/2019 Discharge date: 09/09/2019  Admitted From: Home  Discharge disposition: Home   Recommendations for Outpatient Follow-Up:   . Follow up with your primary care provider in one week.  . Check CBC, BMP in the next visit. . 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  Diet recommendation: Low sodium, heart healthy.    Wound care: None.  Code status: Full.   History of Present Illness:   Tyler Alvarez a 44 y.o.malewith PMH of CKD s/p transplant in 2001, tuberous sclerosis, HTN and gout who presented to ER with nausea/vomiting and admitted for AKI on CKD. For a few years after transplant, his mom reports frequent admissions for kidney infections but none for several years. In the [id number] to 100.60F with mild tachyardia otherwise vitals stable on room air. Labs remarkable for: Cr 2.62, WBC 14.3. Lipase, UA, PT, INR, Lactate WNL. Blood cx drawn. CXR without acute disease. CTT Abd/Pelvis showed findings of  post bilateral nephrectomy with a left lower renal pelvic transplant. There was mild fat stranding changes surround the renal transplant with diffuse bladder wall thickening. This could be dueto infectious or inflammatory process. Patient was given Tylenol, Benadryl, Fentanyl, Zofran, Phenergan and 1500 mL IVF. ED provider requested admission for AKI and continued nausea/vomiting.   Hospital Course:   Following conditions were addressed during hospitalization as listed below,  Nausea, vomiting. Improved at this time. Mild nausea but tolerating PO.    Patient received IV hydration during hospitalization.  Tolerated p.o. diet. Lipase of 44 on presentation.  Will give Zofran as needed on discharge..  Acute kidney injuryon CKD Stage III,Hx ofrenal transplant.  Mild metabolic acidosis. Likely secondary to nausea, vomiting and volume depletion.Cr 2.62 on admission; creatinine of 1.6 today.  Baseline ~ 1.8-2.0 as per nephrology.   On presentation, urinary sodium was 35. Urinary creatinine of 399.FENa less than 1. Likely volume depletion. Patient was continued on tacrolimus and prednisone.   Prograf level at 1.4 but patient had skipped doses of Prograf prior to admission.   Borderline hyperkalemia.   Resolved.  Potassium prior to discharge 4.4.   Possible cystitis/transplant kidney pyelitis. On the background of immune suppression. Patient did have  leukocytosis, tachycardia and fever on presentation.Marland Kitchen History of UTI. CT scan of the abdomen with fat stranding surrounding renal transplant and bladder wall thickening. Received IV Rocephin during hospitalization.Lactate of 0.9. COVID-19 was negative. Blood cultures negative till the time of discharge.  Leukocytosis resolved.  Patient did not have any fever prior to discharge.  Nephrology recommended oral antibiotic with cefpodoxime 100 mg p.o. twice daily to complete 10-day course.  Spoke with nephrology prior to discharge regarding dose of tacrolimus.  Hypomagnesemia. Improved.  Essential hypertension. Patient wass on losartan as outpatient. Nephrology recommends amlodipine 5 milligrams daily on discharge.  Losartan was discontinued..  Normocytic anemia from chronic kidney disease. Closely monitor as outpatient. No indication for PRBC inspiration.  History of gout/tuberous sclerosis continue supportive care and follow-up as outpatient.  Disposition.  At this time, patient is stable for disposition home.  I also spoke with the patient's mother regarding the  plan for  disposition and follow-up.  Medical Consultants:    Nephrology  Procedures:    None Subjective:   Today, patient feels okay.  Denies any vomiting, abdominal pain, fever chills.  Has mild nausea.  Discharge Exam:   Vitals:   09/09/19 0408 09/09/19 0754  BP: (!) 141/92 126/77  Pulse: 81 78  Resp: 20 16  Temp: 97.8 F (36.6 C) 98.4 F (36.9 C)  SpO2: 96% 96%   Vitals:   09/08/19 1537 09/08/19 1945 09/09/19 0408 09/09/19 0754  BP: (!) 155/89 (!) 160/97 (!) 141/92 126/77  Pulse: 97 88 81 78  Resp: 16 19 20 16   Temp: 98.8 F (37.1 C) 99.5 F (37.5 C) 97.8 F (36.6 C) 98.4 F (36.9 C)  TempSrc: Oral Oral Oral Oral  SpO2: 95% 98% 96% 96%  Weight:      Height:       General: Alert awake, not in obvious distress HENT: pupils equally reacting to light,  No scleral pallor or icterus noted. Oral mucosa is moist.  Chest:  Clear breath sounds.  Diminished breath sounds bilaterally. No crackles or wheezes.  CVS: S1 &S2 heard. No murmur.  Regular rate and rhythm. Abdomen: Soft, nontender, nondistended.  Bowel sounds are heard.   Extremities: No cyanosis, clubbing or edema.  Peripheral pulses are palpable. Psych: Alert, awake and communicative normal mood CNS:  No cranial nerve deficits.  Power equal in all extremities.   Skin: Warm and dry.  No rashes noted.  The results of significant diagnostics from this hospitalization (including imaging, microbiology, ancillary and laboratory) are listed below for reference.     Diagnostic Studies:   CT ABDOMEN PELVIS WO CONTRAST  Result Date: 09/06/2019 CLINICAL DATA:  Nausea vomiting abdominal pain EXAM: CT ABDOMEN AND PELVIS WITHOUT CONTRAST TECHNIQUE: Multidetector CT imaging of the abdomen and pelvis was performed following the standard protocol without IV contrast. COMPARISON:  None. FINDINGS: Lower chest: The visualized heart size within normal limits. No pericardial fluid/thickening. No hiatal hernia. Bibasilar centrilobular  cystic changes are seen. Hepatobiliary: Although limited due to the lack of intravenous contrast, normal in appearance without gross focal abnormality. Layering calcified gallstones are present. Pancreas:  Unremarkable.  No surrounding inflammatory changes. Spleen: Normal in size. Although limited due to the lack of intravenous contrast, normal in appearance. Adrenals/Urinary Tract: Both adrenal glands appear normal. The patient has had a bilateral nephrectomy. A left lower pelvic renal transplant is present. There is a 1.8 cm low-density lesion within the lower pole of the left renal transplant. There is mild fat stranding changes seen around the renal transplant. diffuse mild bladder wall thickening is noted. Surgical clips are seen within the left lower pelvis and right lower pelvis. Stomach/Bowel: The stomach, small bowel, and colon are normal in appearance. No inflammatory changes or obstructive findings. Scattered colonic diverticula are noted. The appendix is unremarkable. Vascular/Lymphatic: There are no enlarged abdominal or pelvic lymph nodes. Scattered aortic atherosclerotic calcifications are seen without aneurysmal dilatation. Reproductive: The prostate is unremarkable. Other: No evidence of abdominal wall mass or hernia. Musculoskeletal: No acute or significant osseous findings. IMPRESSION: 1. Status post bilateral nephrectomy with a left lower renal pelvic transplant. There is mild fat stranding changes surround the renal transplant with diffuse bladder wall thickening. This could be due to infectious or inflammatory process. 2.  Aortic Atherosclerosis (ICD10-I70.0). 3. Diverticulosis without diverticulitis. Electronically Signed   By: Prudencio Pair M.D.   On: 09/06/2019 04:02   DG Chest [facility] [address]  Result Date: 09/06/2019 CLINICAL DATA:  Fever nausea vomiting for 2 days EXAM: PORTABLE CHEST 1 VIEW COMPARISON:  None. FINDINGS: The heart size and mediastinal contours are within normal limits.  Aortic knob calcifications are present. Both lungs are clear. The visualized skeletal structures are unremarkable. IMPRESSION: No active disease. Electronically Signed   By: Prudencio Pair M.D.   On: 09/06/2019 01:39     Labs:   Basic Metabolic Panel: Recent Labs  Lab 09/05/19 1623 09/05/19 1623 09/06/19 [id number] 09/06/19 0915 09/06/19 0915 09/07/19 [id number] 09/07/19 0611 09/08/19 0725 09/09/19 0338  NA 137  --   --  139  --  140  --  141 140  K 4.7   < >  --  5.2*   < > 5.2*   < > 4.4 4.4  CL 107  --   --  113*  --  117*  --  117* 113*  CO2 20*  --   --  17*  --  17*  --  19* 19*  GLUCOSE 126*  --   --  97  --  100*  --  114* 100*  BUN 30*  --   --  31*  --  28*  --  18 14  CREATININE 2.62*  --   --  2.51*  --  2.50*  --  1.88* 1.69*  CALCIUM 9.8  --   --  8.1*  --  8.2*  --  8.3* 8.5*  MG  --   --  1.5*  --   --  2.0  --   --  1.2*  PHOS  --   --  3.3  --   --  2.8  --   --   --    < > = values in this interval not displayed.   GFR Estimated Creatinine Clearance: 51.4 mL/min (A) (by C-G formula based on SCr of 1.69 mg/dL (H)). Liver Function Tests: Recent Labs  Lab 09/05/19 1623  AST 15  ALT 20  ALKPHOS 69  BILITOT 1.0  PROT 8.0  ALBUMIN 4.2   Recent Labs  Lab 09/05/19 1623  LIPASE 44   No results for input(s): AMMONIA in the last 168 hours. Coagulation profile Recent Labs  Lab 09/06/19 0039  INR 1.1    CBC: Recent Labs  Lab 09/05/19 1623 09/06/19 0627 09/07/19 0611 09/08/19 0725 09/09/19 0338  WBC 14.3* 10.7* 9.4 7.2 7.4  HGB 13.9 11.7* 9.9* 9.9* 10.0*  HCT 41.1 36.1* 29.9* 30.5* 30.3*  MCV 90.1 92.8 90.6 91.6 92.1  PLT 250 177 161 165 178   Cardiac Enzymes: No results for input(s): CKTOTAL, CKMB, CKMBINDEX, TROPONINI in the last 168 hours. BNP: Invalid input(s): POCBNP CBG: No results for input(s): GLUCAP in the last 168 hours. D-Dimer No results for input(s): DDIMER in the last 72 hours. Hgb A1c No results for input(s): HGBA1C in the last 72  hours. Lipid Profile No results for input(s): CHOL, HDL, LDLCALC, TRIG, CHOLHDL, LDLDIRECT in the last 72 hours. Thyroid function studies No results for input(s): TSH, T4TOTAL, T3FREE, THYROIDAB in the last 72 hours.  Invalid input(s): FREET3 Anemia work up No results for input(s): VITAMINB12, FOLATE, FERRITIN, TIBC, IRON, RETICCTPCT in the last 72 hours. Microbiology Recent Results (from the past 240 hour(s))  Blood Culture (routine x 2)     Status: None (Preliminary result)   Collection Time: 09/06/19  1:16 AM   Specimen: BLOOD RIGHT WRIST  Result Value Ref Range Status   Specimen  Description BLOOD RIGHT WRIST  Final   Special Requests   Final    BOTTLES DRAWN AEROBIC AND ANAEROBIC Blood Culture adequate volume   Culture   Final    NO GROWTH 3 DAYS Performed at [facility], [address] [place], [place] [address]    Report Status PENDING  Incomplete  Blood Culture (routine x 2)     Status: None (Preliminary result)   Collection Time: 09/06/19  1:17 AM   Specimen: BLOOD  Result Value Ref Range Status   Specimen Description BLOOD RIGHT ANTECUBITAL  Final   Special Requests   Final    BOTTLES DRAWN AEROBIC AND ANAEROBIC Blood Culture adequate volume   Culture   Final    NO GROWTH 3 DAYS Performed at [facility], [place] [address] [place], [place] [address]    Report Status PENDING  Incomplete  SARS CORONAVIRUS 2 (TAT 6-24 HRS) Nasopharyngeal Nasopharyngeal Swab     Status: None   Collection Time: 09/06/19  6:27 AM   Specimen: Nasopharyngeal Swab  Result Value Ref Range Status   SARS Coronavirus 2 NEGATIVE NEGATIVE Final    Comment: (NOTE) SARS-CoV-2 target nucleic acids are NOT DETECTED. The SARS-CoV-2 RNA is generally detectable in upper and lower respiratory specimens during the acute phase of infection. Negative results do not preclude SARS-CoV-2 infection, do not rule out co-infections with other pathogens, and should not be used as the sole basis for  treatment or other patient management decisions. Negative results must be combined with clinical observations, patient history, and epidemiological information. The expected result is Negative. Fact Sheet for Patients: SugarRoll.be Fact Sheet for Healthcare Providers: https://www.woods-mathews.com/ This test is not yet approved or cleared by the [place] FDA and  has been authorized for detection and/or diagnosis of SARS-CoV-2 by FDA under an Emergency Use Authorization (EUA). This EUA will remain  in effect (meaning this test can be used) for the duration of the COVID-19 declaration under Section 56 4(b)(1) of the Act, 21 U.S.C. section 360bbb-3(b)(1), unless the authorization is terminated or revoked sooner. Performed at [facility], [place] [address], [place], [place] [address]      Discharge Instructions:   Discharge Instructions    Diet - low sodium heart healthy   Complete by: As directed    Discharge instructions   Complete by: As directed    Follow up with your primary care physician in 1 week.  Check kidney function at that time.  Continue to take your blood pressure medication which has been changed (losartan has been discontinued).  Follow-up  with your nephrologist as has been scheduled by you.   Increase activity slowly   Complete by: As directed      Allergies as of 09/09/2019      Reactions   Erythromycin Nausea And Vomiting   Sulfa Antibiotics Itching, Nausea And Vomiting      Medication List    STOP taking these medications   doxycycline 100 MG capsule Commonly known as: VIBRAMYCIN   losartan 50 MG tablet Commonly known as: COZAAR     TAKE these medications   acetaminophen 500 MG tablet Commonly known as: TYLENOL Take 500 mg by mouth daily as needed for mild pain or headache.   amLODipine 5 MG tablet Commonly known as: NORVASC Take 1 tablet (5 mg total) by mouth daily.   aspirin EC 81 MG  tablet Take 81 mg by mouth daily.   brompheniramine-pseudoephedrine 1-15 MG/5ML Elix Commonly  known as: DIMETAPP Take 20 mLs by mouth daily as needed for allergies.   cefpodoxime 100 MG tablet Commonly known as: VANTIN Take 1 tablet (100 mg total) by mouth 2 (two) times daily.   divalproex 250 MG DR tablet Commonly known as: DEPAKOTE Take 1 tablet (250 mg total) by mouth 2 (two) times daily. What changed:   how much to take  when to take this   gabapentin 300 MG capsule Commonly known as: NEURONTIN TAKE 1 CAPSULE BY MOUTH EVERYDAY AT BEDTIME What changed:   how much to take  how to take this  when to take this  additional instructions   imipramine 25 MG tablet Commonly known as: TOFRANIL Take 50 mg by mouth at bedtime.   omeprazole 20 MG capsule Commonly known as: PRILOSEC Take 20 mg by mouth daily.   ondansetron 4 MG tablet Commonly known as: Zofran Take 1 tablet (4 mg total) by mouth every 8 (eight) hours as needed for nausea or vomiting.   predniSONE 5 MG tablet Commonly known as: DELTASONE Take 5 mg by mouth daily.   tacrolimus 1 MG capsule Commonly known as: PROGRAF Take 2 mg by mouth 2 (two) times daily.          Time coordinating discharge: 39 minutes  Signed:  Janiece Scovill  [facility] 09/09/2019, 10:16 AM

## 2019-09-09 NOTE — Plan of Care (Signed)
  Problem: Education: Goal: Knowledge of General Education information will improve Description: Including pain rating scale, medication(s)/side effects and non-pharmacologic comfort measures Outcome: Progressing   Problem: Health Behavior/Discharge Planning: Goal: Ability to manage health-related needs will improve Outcome: Progressing   Problem: Clinical Measurements: Goal: Ability to maintain clinical measurements within normal limits will improve Outcome: Progressing Goal: Will remain free from infection Outcome: Progressing Goal: Diagnostic test results will improve Outcome: Progressing   Problem: Elimination: Goal: Will not experience complications related to urinary retention Outcome: Progressing   Problem: Pain Managment: Goal: General experience of comfort will improve Outcome: Progressing   Problem: Safety: Goal: Ability to remain free from injury will improve Outcome: Progressing

## 2019-09-09 NOTE — Consult Note (Signed)
[facility] Progress Note   PCP: Daisy Floro, DO Admit Date: 09/05/2019 Requesting Physician: Flora Lipps, MD Reason for Consult:  AKI Assessment/ Plan:   1. AKI on CKD III-T, improving  patient creatinine today is 1.69, BUN 14.  He does not have any complaints this morning and does not have any pain.  No nausea vomiting, has been eating well and drinking well. -Hold home losartan, can d/c on amlodipine with o/p resumption of losartan  -discontinue fluids and monitor p.o. intake -Follow-up with with Dr. Joelyn Oms outpatient -stable for discharge from a nephrology point of view, will sign off at this time  2. Transplant Pyelonephritis  Bladder wall thickening  patient tolerating antibiotics well.   -Continue empiric antibiotics for 10-day course, transition to cefpodoxime p.o.100 mg BID for a total 10 day treatment 3. Proteinuria  UPC ordered yesterday, 0.94, not nephrotic range, but unclear if patient's baseline. No need to keep in hospital if work up warranted given improving labs.  -f/u Dr. Joelyn Oms o/p  4. Hx of renal carcinoma s/p b/l nephrectomy (1999), s/p renal transplant (2001) Continue home prograf and prednisone. Tac level 1.4, low; however, timing of draw and last dosing questionable. Recommend continuing 2 mg BID and reassessing with Dr. Joelyn Oms outpatient  -avoid left extremity for blood pressures or labs -outpatient f/u for tac level 5. Hypertension  Has been on losartan by [facility] cardiology.  -restarted amlodipine 5 mg daily  6. Ascending Aortic Aneurysm  Currently followed by [facility] cardiology.  Patient denies any chest pain today.  He is worried that his blood pressures have been elevated as we have been holding his losartan. -Continue to monitor blood pressures, restarted amlodipine 5 mg 09/08/19  7. Tuberous sclerosis  8. Seizure disorder  -continue Depakote 9. GERD   Subjective:   No complaints this morning.  Is eating and drinking well.  No  pain   Objective:   BP 126/77 (BP Location: Right Arm)   Pulse 78   Temp 98.4 F (36.9 C) (Oral)   Resp 16   Ht 6' (1.829 m)   Wt 65.8 kg   SpO2 96%   BMI 19.67 kg/m  Intake/Output      04/26 0701 - 04/27 0700 04/27 0701 - 04/28 0700   P.O. 360    Total Intake(mL/kg) 360 (5.5)    Urine (mL/kg/hr)     Stool     Total Output     Net +360         Urine Occurrence 7 x     Filed Weights   09/05/19 1619  Weight: 65.8 kg   Weight change:   Physical Exam: Gen: Lying in bed comfortably, alert CVS: Regular rate and rhythm, S1-S2 Resp: CTA B, no respiratory distress Abd: Soft, nontender nondistended.  Positive bowel sounds Ext: Warm, well-perfused.  Moving spontaneously.  No bilateral lower extremity edema.  Imaging: No results found.   Labs: BMET Recent Labs  Lab 09/05/19 1623 09/06/19 0627 09/06/19 0915 09/07/19 0611 09/08/19 0725 09/09/19 0338  NA 137  --  139 140 141 140  K 4.7  --  5.2* 5.2* 4.4 4.4  CL 107  --  113* 117* 117* 113*  CO2 20*  --  17* 17* 19* 19*  GLUCOSE 126*  --  97 100* 114* 100*  BUN 30*  --  31* 28* 18 14  CREATININE 2.62*  --  2.51* 2.50* 1.88* 1.69*  CALCIUM 9.8  --  8.1* 8.2* 8.3* 8.5*  PHOS  --  3.3  --  2.8  --   --    CBC Recent Labs  Lab 09/06/19 0627 09/07/19 0611 09/08/19 0725 09/09/19 0338  WBC 10.7* 9.4 7.2 7.4  HGB 11.7* 9.9* 9.9* 10.0*  HCT 36.1* 29.9* 30.5* 30.3*  MCV 92.8 90.6 91.6 92.1  PLT 177 161 165 178    Medications:    . amLODipine  5 mg Oral Daily  . aspirin EC  81 mg Oral Daily  . divalproex  250 mg Oral BID  . enoxaparin (LOVENOX) injection  40 mg Subcutaneous Daily  . gabapentin  300 mg Oral QHS  . pantoprazole  40 mg Oral Daily  . predniSONE  5 mg Oral Q breakfast  . sodium chloride flush  3 mL Intravenous Once  . tacrolimus  2 mg Oral BID    Zettie Cooley, MD [place] Resident, PGY2 09/09/2019, 9:32 AM

## 2019-09-11 LAB — CULTURE, BLOOD (ROUTINE X 2)
Culture: NO GROWTH
Culture: NO GROWTH
Special Requests: ADEQUATE
Special Requests: ADEQUATE

## 2019-09-15 ENCOUNTER — Other Ambulatory Visit: Payer: Self-pay

## 2019-09-15 ENCOUNTER — Encounter: Payer: Self-pay | Admitting: Family Medicine

## 2019-09-15 ENCOUNTER — Ambulatory Visit (INDEPENDENT_AMBULATORY_CARE_PROVIDER_SITE_OTHER): Payer: Medicare Other | Admitting: Family Medicine

## 2019-09-15 VITALS — BP 125/80 | HR 105 | Temp 99.1°F | Wt 142.0 lb

## 2019-09-15 DIAGNOSIS — N1831 Chronic kidney disease, stage 3a: Secondary | ICD-10-CM | POA: Diagnosis not present

## 2019-09-15 DIAGNOSIS — M542 Cervicalgia: Secondary | ICD-10-CM

## 2019-09-15 DIAGNOSIS — N179 Acute kidney failure, unspecified: Secondary | ICD-10-CM

## 2019-09-15 MED ORDER — DICLOFENAC SODIUM 1 % EX GEL: 4.0000 g | Freq: Two times a day (BID) | CUTANEOUS | 0 refills | Status: DC | PRN

## 2019-09-15 NOTE — Patient Instructions (Addendum)
Thank you for coming to see me today. It was a pleasure! Today we talked about:   For your neck pain I recommend that you do the stretches below.  Try to do these as regular as possible especially on the computer for extended periods of time.  I also recommend that you use Voltaren gel on the area that is most painful.  You may also use heating pads to help with the pain and continue Tylenol as needed.   Please follow-up with Dr. Milus Banister in 3 months or sooner as needed. Please return in 4-6 weeks if your neck pain is not any better.   If you have any questions or concerns, please do not hesitate to call the office at [phone].  Take Care,   Tyler Lochlann Mastrangelo, DO  Neck Exercises Ask your health care provider which exercises are safe for you. Do exercises exactly as told by your health care provider and adjust them as directed. It is normal to feel mild stretching, pulling, tightness, or discomfort as you do these exercises. Stop right away if you feel sudden pain or your pain gets worse. Do not begin these exercises until told by your health care provider. Neck exercises can be important for many reasons. They can improve strength and maintain flexibility in your neck, which will help your upper back and prevent neck pain. Stretching exercises Rotation neck stretching  1. Sit in a chair or stand up. 2. Place your feet flat on the floor, shoulder width apart. 3. Slowly turn your head (rotate) to the right until a slight stretch is felt. Turn it all the way to the right so you can look over your right shoulder. Do not tilt or tip your head. 4. Hold this position for 10-30 seconds. 5. Slowly turn your head (rotate) to the left until a slight stretch is felt. Turn it all the way to the left so you can look over your left shoulder. Do not tilt or tip your head. 6. Hold this position for 10-30 seconds.. Neck retraction 1. Sit in a sturdy chair or stand up. 2. Look straight ahead. Do not  bend your neck. 3. Use your fingers to push your chin backward (retraction). Do not bend your neck for this movement. Continue to face straight ahead. If you are doing the exercise properly, you will feel a slight sensation in your throat and a stretch at the back of your neck. 4. Hold the stretch for 1-2 seconds. Strengthening exercises Neck press 1. Lie on your back on a firm bed or on the floor with a pillow under your head. 2. Use your neck muscles to push your head down on the pillow and straighten your spine. 3. Hold the position as well as you can. Keep your head facing up (in a neutral position) and your chin tucked. 4. Slowly count to 5 while holding this position. Isometrics These are exercises in which you strengthen the muscles in your neck while keeping your neck still (isometrics). 1. Sit in a supportive chair and place your hand on your forehead. 2. Keep your head and face facing straight ahead. Do not flex or extend your neck while doing isometrics. 3. Push forward with your head and neck while pushing back with your hand. Hold for 10 seconds. 4. Do the sequence again, this time putting your hand against the back of your head. Use your head and neck to push backward against the hand pressure. 5. Finally, do the same exercise  on either side of your head, pushing sideways against the pressure of your hand.  6. Prone head lifts 1. Lie face-down (prone position), resting on your elbows so that your chest and upper back are raised. 2. Start with your head facing downward, near your chest. Position your chin either on or near your chest. 3. Slowly lift your head upward. Lift until you are looking straight ahead. Then continue lifting your head as far back as you can comfortably stretch. 4. Hold your head up for 5 seconds. Then slowly lower it to your starting position.  Supine head lifts 1. Lie on your back (supine position), bending your knees to point to the ceiling and keeping  your feet flat on the floor. 2. Lift your head slowly off the floor, raising your chin toward your chest. 3. Hold for 5 seconds.  Scapular retraction 1. Stand with your arms at your sides. Look straight ahead. 2. Slowly pull both shoulders (scapulae) backward and downward (retraction) until you feel a stretch between your shoulder blades in your upper back. 3. Hold for 10-30 seconds. 4. Relax and repeat.  Contact a health care provider if:  Your neck pain or discomfort gets much worse when you do an exercise.  Your neck pain or discomfort does not improve within 2 hours after you exercise. If you have any of these problems, stop exercising right away. Do not do the exercises again unless your health care provider says that you can. Get help right away if:  You develop sudden, severe neck pain. If this happens, stop exercising right away. Do not do the exercises again unless your health care provider says that you can. This information is not intended to replace advice given to you by your health care provider. Make sure you discuss any questions you have with your health care provider. Document Revised: 02/27/2018 Document Reviewed: 02/27/2018 Elsevier Patient Education  [place].

## 2019-09-15 NOTE — Progress Notes (Addendum)
   SUBJECTIVE:   CHIEF COMPLAINT / HPI:   Hospital Follow up: Admitted for AKI after nausea and vomiting with fever with unknown cause. Patient feeling much better after recent hospitalization. He reports no further episodes of vomiting and denies diarrhea. He reports he has been drinking without issues. He denies any SOB, lower extremity swelling or fatigue. Denies further fevers. Reports he was told initially it was a UTI but then told they did not know what was causing his illness. Has nephrology follow up.  Neck pain: Pain in neck since discharge. Patient reports it hurts in his shoulder as well. No one side worse. Sharp pain. Constant and is worse at night. He reports he moved furniture and it made it worse. He denies any weakness in his arms, numbness or tingling.   PERTINENT  PMH / PSH: CKD s/p transplant in 2001, tuberous sclerosis, HTN and gout  OBJECTIVE:  BP 125/80   Pulse (!) 105   Temp 99.1 F (37.3 C) (Oral)   Wt 142 lb (64.4 kg)   SpO2 98%   BMI 19.26 kg/m   General: NAD, pleasant Neck: Supple Cardiovascular: no LE edema Respiratory: normal work of breathing Psych: AOx3, appropriate affect  ASSESSMENT/PLAN:   Acute-on-chronic kidney injury ([name]) Repeat BMP with Cr 1.99. Appears that patient has been around 1.5-1.9 previously. Will continue to try to reach out to patient to inform of results and see how he is feeling. Encourage follow up with nephrology as scheduled. He has not had any further episodes of vomiting or diarrhea and states he is feeling well overall.   Neck pain Patient with MSK neck pain on exam. No red flags. After being in hospital. Patient instructed to use heat therapy with Voltaren gel and Tylenol as needed. Instructed on improving posture and using stretches regularly.     Tyler Krystian Ferrentino, DO PGY-3, [facility]

## 2019-09-16 LAB — CBC
Hematocrit: 36.8 % — ABNORMAL LOW (ref 37.5–51.0)
Hemoglobin: 12.5 g/dL — ABNORMAL LOW (ref 13.0–17.7)
MCH: 30.7 pg (ref 26.6–33.0)
MCHC: 34 g/dL (ref 31.5–35.7)
MCV: 90 fL (ref 79–97)
Platelets: 297 10*3/uL (ref 150–450)
RBC: 4.07 x10E6/uL — ABNORMAL LOW (ref 4.14–5.80)
RDW: 13.1 % (ref 11.6–15.4)
WBC: 12 10*3/uL — ABNORMAL HIGH (ref 3.4–10.8)

## 2019-09-16 LAB — BASIC METABOLIC PANEL
BUN/Creatinine Ratio: 9 (ref 9–20)
BUN: 17 mg/dL (ref 6–24)
CO2: 21 mmol/L (ref 20–29)
Calcium: 10.6 mg/dL — ABNORMAL HIGH (ref 8.7–10.2)
Chloride: 104 mmol/L (ref 96–106)
Creatinine, Ser: 1.99 mg/dL — ABNORMAL HIGH (ref 0.76–1.27)
GFR calc Af Amer: 46 mL/min/{1.73_m2} — ABNORMAL LOW (ref >59)
GFR calc non Af Amer: 39 mL/min/{1.73_m2} — ABNORMAL LOW (ref >59)
Glucose: 108 mg/dL — ABNORMAL HIGH (ref 65–99)
Potassium: 4.8 mmol/L (ref 3.5–5.2)
Sodium: 140 mmol/L (ref 134–144)

## 2019-09-16 NOTE — Unmapped (Signed)
[facility] Refill Coordination Note    Specialty Medication(s) to be Shipped:   Transplant: Prograf 1mg  and Prednisone 5mg     Other medication(s) to be shipped:       Oscar [name], DOB: [birth year]  Phone: [phone] (home)       All above HIPAA information was verified with patient's family member, mom-Shelby.     Was a Nurse, learning disability used for this call? No    Completed refill call assessment today to schedule patient's medication shipment from the [facility] [phone]).       Specialty medication(s) and dose(s) confirmed: Regimen is correct and unchanged.   Changes to medications: Alver reports no changes at this time.  Changes to insurance: No  Questions for the pharmacist: No    Confirmed patient received Welcome Packet with first shipment. The patient will receive a drug information handout for each medication shipped and additional FDA Medication Guides as required.       DISEASE/MEDICATION-SPECIFIC INFORMATION        N/A    SPECIALTY MEDICATION ADHERENCE     Medication Adherence    Patient reported X missed doses in the last month: 3  Specialty Medication: prednisone 5mg    Patient is on additional specialty medications: Yes  Additional Specialty Medications: Prograf 1mg   Patient Reported Additional Medication X Missed Doses in the Last Month: 3  Patient is on more than two specialty medications: No  Informant: mother  Reliability of informant: reliable  Patient is at risk for Non-Adherence: No   Other non-adherence reason: hospitalization/sick   Adherence tools used: patient uses a pill box to manage medications  Support network for adherence: family member                prednisone 5 mg: 10 days of medicine on hand   prograf 1 mg: 10 days of medicine on hand         SHIPPING     Shipping address confirmed in Epic.     Delivery Scheduled: Yes, Expected medication delivery date: 05/11.     Medication will be delivered via UPS to the prescription address in Epic [facility].    Antonietta Barcelona   [facility] Specialty Technician

## 2019-09-18 DIAGNOSIS — M542 Cervicalgia: Secondary | ICD-10-CM | POA: Insufficient documentation

## 2019-09-18 NOTE — Assessment & Plan Note (Addendum)
Repeat BMP with Cr 1.99. Appears that patient has been around 1.5-1.9 previously. Will continue to try to reach out to patient to inform of results and see how he is feeling. Encourage follow up with nephrology as scheduled. He has not had any further episodes of vomiting or diarrhea and states he is feeling well overall.

## 2019-09-18 NOTE — Assessment & Plan Note (Signed)
Patient with MSK neck pain on exam. No red flags. After being in hospital. Patient instructed to use heat therapy with Voltaren gel and Tylenol as needed. Instructed on improving posture and using stretches regularly.

## 2019-09-22 MED FILL — PROGRAF 1 MG CAPSULE: ORAL | 90 days supply | Qty: 360 | Fill #1

## 2019-09-22 MED FILL — PREDNISONE 5 MG TABLET: 90 days supply | Qty: 90 | Fill #1 | Status: AC

## 2019-09-22 MED FILL — PREDNISONE 5 MG TABLET: ORAL | 90 days supply | Qty: 90 | Fill #1

## 2019-09-22 MED FILL — PROGRAF 1 MG CAPSULE: 90 days supply | Qty: 360 | Fill #1 | Status: AC

## 2019-09-23 DIAGNOSIS — Z940 Kidney transplant status: Secondary | ICD-10-CM | POA: Diagnosis not present

## 2019-09-23 DIAGNOSIS — R7309 Other abnormal glucose: Secondary | ICD-10-CM | POA: Diagnosis not present

## 2019-09-23 DIAGNOSIS — N183 Chronic kidney disease, stage 3 unspecified: Secondary | ICD-10-CM | POA: Diagnosis not present

## 2019-10-02 DIAGNOSIS — E559 Vitamin D deficiency, unspecified: Secondary | ICD-10-CM | POA: Diagnosis not present

## 2019-10-02 DIAGNOSIS — D849 Immunodeficiency, unspecified: Secondary | ICD-10-CM | POA: Diagnosis not present

## 2019-10-02 DIAGNOSIS — Z940 Kidney transplant status: Secondary | ICD-10-CM | POA: Diagnosis not present

## 2019-10-02 DIAGNOSIS — N2581 Secondary hyperparathyroidism of renal origin: Secondary | ICD-10-CM | POA: Diagnosis not present

## 2019-10-02 DIAGNOSIS — I10 Essential (primary) hypertension: Secondary | ICD-10-CM | POA: Diagnosis not present

## 2019-10-09 MED ORDER — IMIPRAMINE 25 MG TABLET
ORAL_TABLET | 3 refills | 0 days | Status: CP
Start: 2019-10-09 — End: ?

## 2019-10-16 ENCOUNTER — Other Ambulatory Visit: Payer: Self-pay | Admitting: Family Medicine

## 2019-11-20 DIAGNOSIS — H52203 Unspecified astigmatism, bilateral: Secondary | ICD-10-CM | POA: Diagnosis not present

## 2019-11-20 DIAGNOSIS — H43812 Vitreous degeneration, left eye: Secondary | ICD-10-CM | POA: Diagnosis not present

## 2019-12-02 DIAGNOSIS — Z940 Kidney transplant status: Secondary | ICD-10-CM | POA: Diagnosis not present

## 2019-12-09 NOTE — Unmapped (Signed)
[facility] Clinical Assessment & Refill Coordination Note    Uno Esau, DOB: 07/18/1975  Phone: [phone] (home)     All above HIPAA information was verified with patient.     Was a Nurse, learning disability used for this call? No    Specialty Medication(s):   Transplant: Prograf 1mg  and Prednisone 5mg      Current Outpatient Medications   Medication Sig Dispense Refill   ??? divalproex (DEPAKOTE) 250 MG DR tablet Take 2 tablets (500 mg total) by mouth daily at 0600. This prescription is written specifically for brand name depakote. 180 tablet 3   ??? gabapentin (NEURONTIN) 300 MG capsule Take 1 capsule (300 mg total) by mouth nightly. 90 capsule 3   ??? imipramine (TOFRANIL) 25 MG tablet TAKE 2 TABLETS BY MOUTH NIGHTLY 180 tablet 3   ??? losartan (COZAAR) 25 MG tablet Take 1 tablet (25 mg total) by mouth Two (2) times a day. 60 tablet 11   ??? omeprazole (PRILOSEC) 20 MG capsule TAKE 1 CAPSULE BY MOUTH EVERY DAY 90 capsule 3   ??? predniSONE (DELTASONE) 5 MG tablet Take 1 tablet (5 mg total) by mouth daily. 90 tablet 3   ??? PROGRAF 1 mg capsule Take 2 capsules (2 mg total) by mouth two (2) times a day. 360 capsule 3     No current facility-administered medications for this visit.        Changes to medications: Jerard reports no changes at this time.    Allergies   Allergen Reactions   ??? Erythromycin    ??? Erythromycin Base      Other reaction(s): NAUSEA   ??? Sulfa (Sulfonamide Antibiotics)      Other reaction(s): ITCHING   ??? Sulfacetamide Sodium        Changes to allergies: No    SPECIALTY MEDICATION ADHERENCE     Prograf 1 mg: 13 days of medicine on hand   Prednisone 5 mg: 13 days of medicine on hand     Medication Adherence    Patient reported X missed doses in the last month: 0  Specialty Medication: Prednisone 5mg   Patient is on additional specialty medications: Yes  Additional Specialty Medications: Prograf 1mg   Patient Reported Additional Medication X Missed Doses in the Last Month: 0 Patient is on more than two specialty medications: No  Adherence tools used: patient uses a pill box to manage medications  Support network for adherence: family member          Specialty medication(s) dose(s) confirmed: Regimen is correct and unchanged.     Are there any concerns with adherence? No    Adherence counseling provided? Not needed    CLINICAL MANAGEMENT AND INTERVENTION      Clinical Benefit Assessment:    Do you feel the medicine is effective or helping your condition? Yes    Clinical Benefit counseling provided? Not needed    Adverse Effects Assessment:    Are you experiencing any side effects? No    Are you experiencing difficulty administering your medicine? No    Quality of Life Assessment:    How many days over the past month did your kidney transplant  keep you from your normal activities? For example, brushing your teeth or getting up in the morning. 0    Have you discussed this with your provider? Not needed    Therapy Appropriateness:    Is therapy appropriate? Yes, therapy is appropriate and should be continued    DISEASE/MEDICATION-SPECIFIC INFORMATION  N/A    PATIENT SPECIFIC NEEDS     - Does the patient have any physical, cognitive, or cultural barriers? No    - Is the patient high risk? No     - Does the patient require a Care Management Plan? No     - Does the patient require physician intervention or other additional services (i.e. nutrition, smoking cessation, social work)? No      SHIPPING     Specialty Medication(s) to be Shipped:   Transplant: Prograf 1mg  and Prednisone 5mg     Other medication(s) to be shipped: No additional medications requested for fill at this time     Changes to insurance: No    Delivery Scheduled: Yes, Expected medication delivery date: 12/17/19.     Medication will be delivered via UPS to the confirmed prescription address in [facility].    The patient will receive a drug information handout for each medication shipped and additional FDA Medication Guides as required.  Verified that patient has previously received a Conservation officer, historic buildings.    All of the patient's questions and concerns have been addressed.    Tera Helper   [facility] Specialty Pharmacist

## 2019-12-16 MED FILL — PREDNISONE 5 MG TABLET: 90 days supply | Qty: 90 | Fill #2 | Status: AC

## 2019-12-16 MED FILL — PROGRAF 1 MG CAPSULE: ORAL | 90 days supply | Qty: 360 | Fill #2

## 2019-12-16 MED FILL — PREDNISONE 5 MG TABLET: ORAL | 90 days supply | Qty: 90 | Fill #2

## 2019-12-16 MED FILL — PROGRAF 1 MG CAPSULE: 90 days supply | Qty: 360 | Fill #2 | Status: AC

## 2020-01-03 ENCOUNTER — Encounter (HOSPITAL_COMMUNITY): Payer: Self-pay | Admitting: Internal Medicine

## 2020-01-03 ENCOUNTER — Inpatient Hospital Stay (HOSPITAL_COMMUNITY)
Admission: EM | Admit: 2020-01-03 | Discharge: 2020-01-06 | DRG: 871 | Disposition: A | Discharge status: Home / Self Care | Payer: Medicare Other | Attending: Internal Medicine | Admitting: Internal Medicine

## 2020-01-03 ENCOUNTER — Other Ambulatory Visit: Payer: Self-pay

## 2020-01-03 ENCOUNTER — Emergency Department (HOSPITAL_COMMUNITY): Payer: Medicare Other

## 2020-01-03 DIAGNOSIS — K219 Gastro-esophageal reflux disease without esophagitis: Secondary | ICD-10-CM | POA: Diagnosis not present

## 2020-01-03 DIAGNOSIS — Z8249 Family history of ischemic heart disease and other diseases of the circulatory system: Secondary | ICD-10-CM

## 2020-01-03 DIAGNOSIS — Z7982 Long term (current) use of aspirin: Secondary | ICD-10-CM

## 2020-01-03 DIAGNOSIS — I129 Hypertensive chronic kidney disease with stage 1 through stage 4 chronic kidney disease, or unspecified chronic kidney disease: Secondary | ICD-10-CM | POA: Diagnosis not present

## 2020-01-03 DIAGNOSIS — Z79899 Other long term (current) drug therapy: Secondary | ICD-10-CM | POA: Diagnosis not present

## 2020-01-03 DIAGNOSIS — N183 Chronic kidney disease, stage 3 unspecified: Secondary | ICD-10-CM | POA: Diagnosis present

## 2020-01-03 DIAGNOSIS — Z20822 Contact with and (suspected) exposure to covid-19: Secondary | ICD-10-CM | POA: Diagnosis not present

## 2020-01-03 DIAGNOSIS — M109 Gout, unspecified: Secondary | ICD-10-CM | POA: Diagnosis not present

## 2020-01-03 DIAGNOSIS — J189 Pneumonia, unspecified organism: Secondary | ICD-10-CM | POA: Diagnosis not present

## 2020-01-03 DIAGNOSIS — D84821 Immunodeficiency due to drugs: Secondary | ICD-10-CM | POA: Diagnosis not present

## 2020-01-03 DIAGNOSIS — R1111 Vomiting without nausea: Secondary | ICD-10-CM | POA: Diagnosis not present

## 2020-01-03 DIAGNOSIS — Z825 Family history of asthma and other chronic lower respiratory diseases: Secondary | ICD-10-CM

## 2020-01-03 DIAGNOSIS — Z7952 Long term (current) use of systemic steroids: Secondary | ICD-10-CM

## 2020-01-03 DIAGNOSIS — Z940 Kidney transplant status: Secondary | ICD-10-CM | POA: Diagnosis not present

## 2020-01-03 DIAGNOSIS — G2581 Restless legs syndrome: Secondary | ICD-10-CM | POA: Diagnosis not present

## 2020-01-03 DIAGNOSIS — F988 Other specified behavioral and emotional disorders with onset usually occurring in childhood and adolescence: Secondary | ICD-10-CM | POA: Diagnosis present

## 2020-01-03 DIAGNOSIS — Z973 Presence of spectacles and contact lenses: Secondary | ICD-10-CM | POA: Diagnosis not present

## 2020-01-03 DIAGNOSIS — G40909 Epilepsy, unspecified, not intractable, without status epilepticus: Secondary | ICD-10-CM | POA: Diagnosis present

## 2020-01-03 DIAGNOSIS — Q851 Tuberous sclerosis: Secondary | ICD-10-CM | POA: Diagnosis not present

## 2020-01-03 DIAGNOSIS — R05 Cough: Secondary | ICD-10-CM | POA: Diagnosis not present

## 2020-01-03 DIAGNOSIS — Z881 Allergy status to other antibiotic agents status: Secondary | ICD-10-CM

## 2020-01-03 DIAGNOSIS — I10 Essential (primary) hypertension: Secondary | ICD-10-CM | POA: Diagnosis not present

## 2020-01-03 DIAGNOSIS — E860 Dehydration: Secondary | ICD-10-CM | POA: Diagnosis present

## 2020-01-03 DIAGNOSIS — N1832 Chronic kidney disease, stage 3b: Secondary | ICD-10-CM | POA: Diagnosis present

## 2020-01-03 DIAGNOSIS — Z882 Allergy status to sulfonamides status: Secondary | ICD-10-CM

## 2020-01-03 DIAGNOSIS — R918 Other nonspecific abnormal finding of lung field: Secondary | ICD-10-CM | POA: Diagnosis not present

## 2020-01-03 DIAGNOSIS — Z8049 Family history of malignant neoplasm of other genital organs: Secondary | ICD-10-CM | POA: Diagnosis not present

## 2020-01-03 DIAGNOSIS — Z743 Need for continuous supervision: Secondary | ICD-10-CM | POA: Diagnosis not present

## 2020-01-03 DIAGNOSIS — F419 Anxiety disorder, unspecified: Secondary | ICD-10-CM | POA: Diagnosis present

## 2020-01-03 DIAGNOSIS — A419 Sepsis, unspecified organism: Principal | ICD-10-CM | POA: Diagnosis present

## 2020-01-03 DIAGNOSIS — J9811 Atelectasis: Secondary | ICD-10-CM | POA: Diagnosis not present

## 2020-01-03 DIAGNOSIS — R0689 Other abnormalities of breathing: Secondary | ICD-10-CM | POA: Diagnosis not present

## 2020-01-03 HISTORY — DX: Pneumonia, unspecified organism: J18.9

## 2020-01-03 LAB — CBC WITH DIFFERENTIAL/PLATELET
Abs Immature Granulocytes: 0.13 10*3/uL — ABNORMAL HIGH (ref 0.00–0.07)
Basophils Absolute: 0.1 10*3/uL (ref 0.0–0.1)
Basophils Relative: 0 %
Eosinophils Absolute: 0.7 10*3/uL — ABNORMAL HIGH (ref 0.0–0.5)
Eosinophils Relative: 3 %
HCT: 37.5 % — ABNORMAL LOW (ref 39.0–52.0)
Hemoglobin: 12.5 g/dL — ABNORMAL LOW (ref 13.0–17.0)
Immature Granulocytes: 1 %
Lymphocytes Relative: 6 %
Lymphs Abs: 1.4 10*3/uL (ref 0.7–4.0)
MCH: 29.1 pg (ref 26.0–34.0)
MCHC: 33.3 g/dL (ref 30.0–36.0)
MCV: 87.4 fL (ref 80.0–100.0)
Monocytes Absolute: 1.7 10*3/uL — ABNORMAL HIGH (ref 0.1–1.0)
Monocytes Relative: 7 %
Neutro Abs: 21.1 10*3/uL — ABNORMAL HIGH (ref 1.7–7.7)
Neutrophils Relative %: 83 %
Platelets: 218 10*3/uL (ref 150–400)
RBC: 4.29 MIL/uL (ref 4.22–5.81)
RDW: 13.2 % (ref 11.5–15.5)
WBC: 25.1 10*3/uL — ABNORMAL HIGH (ref 4.0–10.5)
nRBC: 0 % (ref 0.0–0.2)

## 2020-01-03 LAB — URINALYSIS, ROUTINE W REFLEX MICROSCOPIC
Bacteria, UA: NONE SEEN
Bilirubin Urine: NEGATIVE
Glucose, UA: NEGATIVE mg/dL
Hgb urine dipstick: NEGATIVE
Ketones, ur: NEGATIVE mg/dL
Leukocytes,Ua: NEGATIVE
Nitrite: NEGATIVE
Protein, ur: 30 mg/dL — AB
Specific Gravity, Urine: 1.005 (ref 1.005–1.030)
pH: 7 (ref 5.0–8.0)

## 2020-01-03 LAB — COMPREHENSIVE METABOLIC PANEL
ALT: 30 U/L (ref 0–44)
AST: 24 U/L (ref 15–41)
Albumin: 3.5 g/dL (ref 3.5–5.0)
Alkaline Phosphatase: 110 U/L (ref 38–126)
Anion gap: 11 (ref 5–15)
BUN: 22 mg/dL — ABNORMAL HIGH (ref 6–20)
CO2: 20 mmol/L — ABNORMAL LOW (ref 22–32)
Calcium: 8.9 mg/dL (ref 8.9–10.3)
Chloride: 102 mmol/L (ref 98–111)
Creatinine, Ser: 2.15 mg/dL — ABNORMAL HIGH (ref 0.61–1.24)
GFR calc Af Amer: 42 mL/min — ABNORMAL LOW (ref >60)
GFR calc non Af Amer: 36 mL/min — ABNORMAL LOW (ref >60)
Glucose, Bld: 111 mg/dL — ABNORMAL HIGH (ref 70–99)
Potassium: 4 mmol/L (ref 3.5–5.1)
Sodium: 133 mmol/L — ABNORMAL LOW (ref 135–145)
Total Bilirubin: 1.6 mg/dL — ABNORMAL HIGH (ref 0.3–1.2)
Total Protein: 7.3 g/dL (ref 6.5–8.1)

## 2020-01-03 LAB — PROTIME-INR
INR: 1.1 (ref 0.8–1.2)
Prothrombin Time: 13.8 seconds (ref 11.4–15.2)

## 2020-01-03 LAB — LACTIC ACID, PLASMA: Lactic Acid, Venous: 1.1 mmol/L (ref 0.5–1.9)

## 2020-01-03 LAB — SARS CORONAVIRUS 2 BY RT PCR (HOSPITAL ORDER, PERFORMED IN ~~LOC~~ HOSPITAL LAB): SARS Coronavirus 2: NEGATIVE

## 2020-01-03 LAB — APTT: aPTT: 41 seconds — ABNORMAL HIGH (ref 24–36)

## 2020-01-03 MED ORDER — ENOXAPARIN SODIUM 40 MG/0.4ML ~~LOC~~ SOLN
40.0000 mg | SUBCUTANEOUS | Status: DC
  Administered 2020-01-04 – 2020-01-05 (×2): 40 mg via SUBCUTANEOUS
  Filled 2020-01-03 (×2): qty 0.4

## 2020-01-03 MED ORDER — ALBUTEROL SULFATE HFA 108 (90 BASE) MCG/ACT IN AERS: 1.0000 | INHALATION_SPRAY | RESPIRATORY_TRACT | Status: DC | PRN

## 2020-01-03 MED ORDER — POLYETHYLENE GLYCOL 3350 17 G PO PACK: 17.0000 g | PACK | Freq: Every day | ORAL | Status: DC | PRN

## 2020-01-03 MED ORDER — ONDANSETRON HCL 4 MG PO TABS: 4.0000 mg | ORAL_TABLET | Freq: Four times a day (QID) | ORAL | Status: DC | PRN

## 2020-01-03 MED ORDER — LACTATED RINGERS IV BOLUS (SEPSIS)
1000.0000 mL | Freq: Once | INTRAVENOUS | Status: AC
  Administered 2020-01-03: 1000 mL via INTRAVENOUS

## 2020-01-03 MED ORDER — ACETAMINOPHEN 650 MG RE SUPP: 650.0000 mg | Freq: Four times a day (QID) | RECTAL | Status: DC | PRN

## 2020-01-03 MED ORDER — DIVALPROEX SODIUM 125 MG PO CSDR
250.0000 mg | DELAYED_RELEASE_CAPSULE | Freq: Every day | ORAL | Status: DC
  Administered 2020-01-04 – 2020-01-05 (×2): 250 mg via ORAL
  Filled 2020-01-03 (×3): qty 2

## 2020-01-03 MED ORDER — GUAIFENESIN-DM 100-10 MG/5ML PO SYRP
10.0000 mL | ORAL_SOLUTION | Freq: Three times a day (TID) | ORAL | Status: DC
  Administered 2020-01-04 (×3): 10 mL via ORAL
  Filled 2020-01-03 (×3): qty 10

## 2020-01-03 MED ORDER — ACETAMINOPHEN 325 MG PO TABS
650.0000 mg | ORAL_TABLET | Freq: Four times a day (QID) | ORAL | Status: DC | PRN
  Administered 2020-01-04 – 2020-01-06 (×4): 650 mg via ORAL
  Filled 2020-01-03 (×4): qty 2

## 2020-01-03 MED ORDER — SODIUM CHLORIDE 0.9 % IV SOLN
100.0000 mg | Freq: Two times a day (BID) | INTRAVENOUS | Status: DC
  Administered 2020-01-04 – 2020-01-05 (×4): 100 mg via INTRAVENOUS
  Filled 2020-01-03 (×7): qty 100

## 2020-01-03 MED ORDER — SODIUM CHLORIDE 0.9 % IV SOLN
1.0000 g | INTRAVENOUS | Status: DC
  Administered 2020-01-04 – 2020-01-05 (×2): 1 g via INTRAVENOUS
  Filled 2020-01-03 (×2): qty 10

## 2020-01-03 MED ORDER — SODIUM CHLORIDE 0.9 % IV SOLN
2.0000 g | INTRAVENOUS | Status: DC
  Administered 2020-01-03: 2 g via INTRAVENOUS
  Filled 2020-01-03: qty 20

## 2020-01-03 MED ORDER — ALBUTEROL SULFATE (2.5 MG/3ML) 0.083% IN NEBU: 2.5000 mg | INHALATION_SOLUTION | RESPIRATORY_TRACT | Status: DC | PRN

## 2020-01-03 MED ORDER — SODIUM CHLORIDE 0.9 % IV SOLN: INTRAVENOUS | Status: AC

## 2020-01-03 MED ORDER — ACETAMINOPHEN 325 MG PO TABS
650.0000 mg | ORAL_TABLET | Freq: Once | ORAL | Status: AC
  Administered 2020-01-03: 650 mg via ORAL
  Filled 2020-01-03: qty 2

## 2020-01-03 MED ORDER — HYDROCORTISONE NA SUCCINATE PF 100 MG IJ SOLR
40.0000 mg | Freq: Three times a day (TID) | INTRAMUSCULAR | Status: DC
  Administered 2020-01-03 – 2020-01-04 (×3): 40 mg via INTRAVENOUS
  Filled 2020-01-03 (×3): qty 2

## 2020-01-03 MED ORDER — GABAPENTIN 300 MG PO CAPS
300.0000 mg | ORAL_CAPSULE | Freq: Every day | ORAL | Status: DC
  Filled 2020-01-03: qty 1

## 2020-01-03 MED ORDER — LOSARTAN POTASSIUM 50 MG PO TABS
50.0000 mg | ORAL_TABLET | Freq: Two times a day (BID) | ORAL | Status: DC
  Administered 2020-01-04 (×2): 50 mg via ORAL
  Filled 2020-01-03 (×2): qty 1

## 2020-01-03 MED ORDER — AMLODIPINE BESYLATE 5 MG PO TABS
5.0000 mg | ORAL_TABLET | Freq: Every day | ORAL | Status: DC
  Administered 2020-01-04 – 2020-01-06 (×3): 5 mg via ORAL
  Filled 2020-01-03 (×3): qty 1

## 2020-01-03 MED ORDER — SODIUM CHLORIDE 0.9 % IV SOLN
100.0000 mg | Freq: Once | INTRAVENOUS | Status: AC
  Administered 2020-01-03: 100 mg via INTRAVENOUS
  Filled 2020-01-03 (×2): qty 100

## 2020-01-03 MED ORDER — TACROLIMUS 0.5 MG PO CAPS
2.0000 mg | ORAL_CAPSULE | Freq: Two times a day (BID) | ORAL | Status: DC
  Administered 2020-01-04 – 2020-01-06 (×5): 2 mg via ORAL
  Filled 2020-01-03 (×6): qty 4

## 2020-01-03 MED ORDER — ONDANSETRON HCL 4 MG/2ML IJ SOLN
4.0000 mg | Freq: Four times a day (QID) | INTRAMUSCULAR | Status: DC | PRN
  Administered 2020-01-03: 4 mg via INTRAVENOUS
  Filled 2020-01-03: qty 2

## 2020-01-03 MED ORDER — LACTATED RINGERS IV SOLN: INTRAVENOUS | Status: DC

## 2020-01-03 NOTE — ED Triage Notes (Addendum)
Pt brought in by EMS.. Pt has been coughing and fever for 2 days. Pt had initially gone to urgent care but didn't want to wait . Pt has had covid shots. Pt taking tylenol .

## 2020-01-03 NOTE — H&P (Addendum)
History and Physical    Tyler Alvarez VVO:[id number] DOB: January 31, 1976 DOA: 01/03/2020  PCP: Daisy Floro, DO   Patient coming from: Home  I have personally briefly reviewed patient's old medical records in [place]  Chief Complaint: Difficulty breathing, cough  HPI: Tyler Alvarez is a 44 y.o. male with medical history significant forTubero sclerosis, renal transplant, seizures, CKD 3, hypertension.  Patient presented to the ED with complaints of cough and fever of 2 days duration.  He also reports generalized weakness.  One episode of vomiting prior to arrival in the ED.  No pain with urination. 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    ED Course: Temperature 102.7, heart rate 90s to 112, blood pressure systolic 269S to 854O.  O2 sats greater than 96% on room air.  Lactic acid normal 1.1.  WBC 25.  Creatinine 2.15.  Portable chest x-ray right perihilar and left basilar infiltrates worrisome for pneumonia.   2 L bolus given.  IV ceftriaxone and doxycycline started.  Hospitalist to admit for pneumonia.  Review of Systems: As per HPI all other systems reviewed and negative.  Past Medical History:  Diagnosis Date  . ADD (attention deficit disorder)   . Anxiety   . Benign skin lesion of neck    left neck cyst  . Chronic kidney disease    s/p transplant 2001  . Family history of adverse reaction to anesthesia    Pt mother stated Father-"I think that he stopped breathing, they called a code but he came through okay"  . GERD (gastroesophageal reflux disease)   . Headache    Migraines  . Heart murmur   . Hypertension   . Immune deficiency disorder ([place])   . Restless leg syndrome   . Seizures ([place])    treated by Dr. Melrose Nakayama, no seizures in years and years  . 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[place])   . Wears glasses     Past Surgical History:  Procedure Laterality Date  . AV FISTULA PLACEMENT    . KIDNEY TRANSPLANT  2001  . MASS EXCISION Left 12/11/2016    Procedure: EXCISION OF BENIGN LESION OF THE NECK WITH LAYERED CLOSURE;  Surgeon: Irene Limbo, MD;  Location: [facility];  Service: Plastics;  Laterality: Left;  Marland Kitchen MASS EXCISION     neck x2  . MASS EXCISION Bilateral 04/01/2018   Procedure: excision benign lesions left neck and right and left cheek ( 4cm, 2cm, 1cm x3), layered closure neck <10cm, layered closure right cheek 1cm;  Surgeon: Irene Limbo, MD;  Location: [facility];  Service: Plastics;  Laterality: Bilateral;  left cheek lesion  . MULTIPLE TOOTH EXTRACTIONS    . NEPHRECTOMY     both removed in 1999, 3 months apart  . REVISON OF ARTERIOVENOUS FISTULA Left 07/16/2014   Procedure: EXCISION ANEURYSMAL AREA OF LEFT ARTERIOVENOUS FISTULA;  Surgeon: Serafina Mitchell, MD;  Location: [facility];  Service: Vascular;  Laterality: Left;     reports that he has never smoked. He has never used smokeless tobacco. He reports that he does not drink alcohol and does not use drugs.  Allergies  Allergen Reactions  . Erythromycin Nausea And Vomiting  . Sulfa Antibiotics Itching and Nausea And Vomiting    Family History  Problem Relation Age of Onset  . Hypothyroidism Mother   . Cancer Mother 21       cervical  . Hypertension Mother   . COPD Mother   . COPD Father 18  COPD  . Ulcers Father   . COPD Brother   . Heart disease Brother   . Cancer Other     Prior to Admission medications   Medication Sig Start Date End Date Taking? Authorizing Provider  acetaminophen (TYLENOL) 500 MG tablet Take 500 mg by mouth daily as needed for mild pain or headache.    [provider]  amLODipine (NORVASC) 5 MG tablet Take 1 tablet (5 mg total) by mouth daily. 09/09/19   Pokhrel, Corrie Mckusick, MD  aspirin EC 81 MG tablet Take 81 mg by mouth daily.    [provider]  brompheniramine-pseudoephedrine (DIMETAPP) 1-15 MG/5ML ELIX Take 20 mLs by mouth daily as needed for allergies.    [provider]  cefpodoxime (VANTIN) 100 MG tablet Take  1 tablet (100 mg total) by mouth 2 (two) times daily. 09/09/19   Pokhrel, Corrie Mckusick, MD  diclofenac Sodium (VOLTAREN) 1 % GEL APPLY 4 G TOPICALLY 2 (TWO) TIMES DAILY AS NEEDED. 10/16/19   Milus Banister C, DO  divalproex (DEPAKOTE) 250 MG DR tablet Take 1 tablet (250 mg total) by mouth 2 (two) times daily. Patient taking differently: Take 500 mg by mouth daily.  02/17/19   Daisy Floro, DO  gabapentin (NEURONTIN) 300 MG capsule TAKE 1 CAPSULE BY MOUTH EVERYDAY AT BEDTIME Patient taking differently: Take 300 mg by mouth at bedtime.  02/17/19   Milus Banister C, DO  imipramine (TOFRANIL) 25 MG tablet Take 50 mg by mouth at bedtime.     [provider]  omeprazole (PRILOSEC) 20 MG capsule Take 20 mg by mouth daily.    [provider]  ondansetron (ZOFRAN) 4 MG tablet Take 1 tablet (4 mg total) by mouth every 8 (eight) hours as needed for nausea or vomiting. 09/09/19   Pokhrel, Corrie Mckusick, MD  predniSONE (DELTASONE) 5 MG tablet Take 5 mg by mouth daily.     [provider]  tacrolimus (PROGRAF) 1 MG capsule Take 2 mg by mouth 2 (two) times daily.     [provider]  divalproex (DEPAKOTE) 250 MG DR tablet TAKE 1 TABLET BY MOUTH TWICE A DAY 08/12/18   Diallo, Abdoulaye, MD  gabapentin (NEURONTIN) 300 MG capsule TAKE 1 CAPSULE BY MOUTH EVERYDAY AT BEDTIME 08/12/18   Marjie Skiff, MD    Physical Exam: Vitals:   01/03/20 1800 01/03/20 1805 01/03/20 1830 01/03/20 1850  BP: (!) 147/88 (!) 149/86 (!) 161/97 (!) 151/86  Pulse: 95 94  (!) 101  Resp: (!) 25   18  Temp:    98.2 F (36.8 C)  TempSrc:    Oral  SpO2: 98% 99%  99%  Weight:      Height:        Constitutional: NAD, calm, comfortable Vitals:   01/03/20 1800 01/03/20 1805 01/03/20 1830 01/03/20 1850  BP: (!) 147/88 (!) 149/86 (!) 161/97 (!) 151/86  Pulse: 95 94  (!) 101  Resp: (!) 25   18  Temp:    98.2 F (36.8 C)  TempSrc:    Oral  SpO2: 98% 99%  99%  Weight:      Height:       Eyes: PERRL,  lids and conjunctivae normal ENMT: Mucous membranes are mildly dry Neck: normal, supple, no masses, no thyromegaly Respiratory: Normal respiratory effort. No accessory muscle use.  Cardiovascular: Regular rate and rhythm, No extremity edema. 2+ pedal pulses.  Abdomen: no tenderness, no masses palpated. No hepatosplenomegaly. Bowel sounds positive.  Musculoskeletal: no clubbing /  cyanosis. No joint deformity upper and lower extremities. Good ROM, no contractures. Normal muscle tone.  Skin: Skin lesions consistent with tuberosclerosis, apparent around his neck,  Neurologic: No apparent cranial nerve abnormality, moving extremities spontaneously.Marland Kitchen  Psychiatric: Normal judgment and insight. Alert and oriented x 3. Normal mood.   Labs on Admission: I have personally reviewed following labs and imaging studies  CBC: Recent Labs  Lab 01/03/20 1522  WBC 25.1*  NEUTROABS 21.1*  HGB 12.5*  HCT 37.5*  MCV 87.4  PLT 983   Basic Metabolic Panel: Recent Labs  Lab 01/03/20 1522  NA 133*  K 4.0  CL 102  CO2 20*  GLUCOSE 111*  BUN 22*  CREATININE 2.15*  CALCIUM 8.9   Liver Function Tests: Recent Labs  Lab 01/03/20 1522  AST 24  ALT 30  ALKPHOS 110  BILITOT 1.6*  PROT 7.3  ALBUMIN 3.5   Coagulation Profile: Recent Labs  Lab 01/03/20 1522  INR 1.1    Radiological Exams on Admission: DG Chest Port 1 View  Result Date: 01/03/2020 CLINICAL DATA:  Cough. EXAM: PORTABLE CHEST 1 VIEW COMPARISON:  September 06, 2019 FINDINGS: Infiltrates are seen in the right perihilar region and left base. The opacity in the right perihilar region is somewhat platelike. The heart, hila, mediastinum, lungs, and pleura are otherwise unremarkable. IMPRESSION: Right perihilar and left basilar infiltrates are worrisome for pneumonia given history. The right perihilar opacity is somewhat platelike in atelectasis is a possibility as well. Electronically Signed   By: Dorise Bullion III M.D   On: 01/03/2020  16:58    EKG: Pending.  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  Normal lactic acid 1.1.  O2 sats greater than 96% on room air.  COVID-19 test negative.  Patient has received Covid vaccines.  Immunocompromised. Portable chest x-ray - right perihilar and left basilar infiltrate worrisome for pneumonia. -Continue IV ceftriaxone and doxycycline (erythromycin allergy noted) -2 L bolus given, continue N/s 100cc/hr x 12 hrs -CBC, BMP in the morning -Mucolytic's, as needed bronchodilators -Follow-up blood and urine cultures ordered in the ED. -As patient is septic, will start stress dose steroids with IV hydrocortisone 40 TID  CKD 3, history of renal transplant-creatinine 2.1, recent baseline 1.6-1.9. -Hold home prednisone while on stress dose steroids  -Resume Prograf  Tubero- sclerosis, history of seizures-mother reports no seizures in several years, and dose of depakote adjusted to 1ce daily. - Depakote resumed at 250mg  daily, pending med rec, -pls adjust dose - Resume gabapentin  Hypertension-systolic 382N - resume Norvasc, losartan  Anxiety  - Pending med rec, resume imipramine.   DVT prophylaxis: Lovenox Code Status: Full code Family Communication: Mother at bedside. Disposition Plan: ~ 2 days.  Pending treatment of pneumonia, resolution of sepsis physiology. Consults called: None Admission status: Inpatient, telemetry I certify that at the point of admission it is my clinical judgment that the patient will require inpatient hospital care spanning beyond 2 midnights from the point of admission due to high intensity of service, high risk for further deterioration and high frequency of surveillance required.  Patient requires  hospitalization for close monitoring while on intravenous antibiotics for treatment of his pneumonia as he is immunocompromised.   Bethena Roys MD [facility]  01/03/2020, 9:57 PM

## 2020-01-03 NOTE — ED Provider Notes (Signed)
[facility] Provider Note   CSN: [id number] Arrival date & time: 01/03/20  1350     History Chief Complaint  Patient presents with  . Emesis    Tyler Alvarez is a 44 y.o. male.  Patient complains of cough and fever and chills for the last couple days.  He has a history of a kidney transplant and is taking immunosuppressive therapy  The history is provided by the patient and medical records. No language interpreter was used.  Cough Cough characteristics:  Productive Sputum characteristics:  Nondescript Severity:  Moderate Onset quality:  Sudden Timing:  Constant Progression:  Worsening Chronicity:  New Smoker: no   Context: not animal exposure   Relieved by:  Nothing Worsened by:  Nothing Associated symptoms: no chest pain, no eye discharge, no headaches and no rash        Past Medical History:  Diagnosis Date  . ADD (attention deficit disorder)   . Anxiety   . Benign skin lesion of neck    left neck cyst  . Chronic kidney disease    s/p transplant 2001  . Family history of adverse reaction to anesthesia    Pt mother stated Father-"I think that he stopped breathing, they called a code but he came through okay"  . GERD (gastroesophageal reflux disease)   . Headache    Migraines  . Heart murmur   . Hypertension   . Immune deficiency disorder ([place])   . Restless leg syndrome   . Seizures ([place])    treated by Dr. Melrose Nakayama, no seizures in years and years  . 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 Wears glasses     Patient Active Problem List   Diagnosis Date Noted  . Neck pain 09/18/2019  . Acute-on-chronic kidney injury ([place]) 09/06/2019  . Gout 10/09/2017  . CKD (chronic kidney disease), stage III 10/09/2017  . Normocytic anemia   . Renal transplant, status post   . Essential hypertension   . Aneurysm ([place]) 07/16/2014  . 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 HEARING LOSS, LEFT EAR 11/25/2008    Past Surgical History:  Procedure Laterality  Date  . AV FISTULA PLACEMENT    . KIDNEY TRANSPLANT  2001  . MASS EXCISION Left 12/11/2016   Procedure: EXCISION OF BENIGN LESION OF THE NECK WITH LAYERED CLOSURE;  Surgeon: Irene Limbo, MD;  Location: [facility];  Service: Plastics;  Laterality: Left;  Marland Kitchen MASS EXCISION     neck x2  . MASS EXCISION Bilateral 04/01/2018   Procedure: excision benign lesions left neck and right and left cheek ( 4cm, 2cm, 1cm x3), layered closure neck <10cm, layered closure right cheek 1cm;  Surgeon: Irene Limbo, MD;  Location: [facility];  Service: Plastics;  Laterality: Bilateral;  left cheek lesion  . MULTIPLE TOOTH EXTRACTIONS    . NEPHRECTOMY     both removed in 1999, 3 months apart  . REVISON OF ARTERIOVENOUS FISTULA Left 07/16/2014   Procedure: EXCISION ANEURYSMAL AREA OF LEFT ARTERIOVENOUS FISTULA;  Surgeon: Serafina Mitchell, MD;  Location: [facility];  Service: Vascular;  Laterality: Left;       Family History  Problem Relation Age of Onset  . Hypothyroidism Mother   . Cancer Mother 59       cervical  . Hypertension Mother   . COPD Mother   . COPD Father 39       COPD  . Ulcers Father   . COPD Brother   . Heart disease Brother   .  Cancer Other     Social History   Tobacco Use  . Smoking status: Never Smoker  . Smokeless tobacco: Never Used  Vaping Use  . Vaping Use: Never used  Substance Use Topics  . Alcohol use: No  . Drug use: No    Home Medications Prior to Admission medications   Medication Sig Start Date End Date Taking? Authorizing Provider  acetaminophen (TYLENOL) 500 MG tablet Take 500 mg by mouth daily as needed for mild pain or headache.    [provider]  amLODipine (NORVASC) 5 MG tablet Take 1 tablet (5 mg total) by mouth daily. 09/09/19   Pokhrel, Corrie Mckusick, MD  aspirin EC 81 MG tablet Take 81 mg by mouth daily.    [provider]  brompheniramine-pseudoephedrine (DIMETAPP) 1-15 MG/5ML ELIX Take 20 mLs by mouth daily as needed for allergies.    [provider]  cefpodoxime (VANTIN) 100 MG tablet Take 1 tablet (100 mg total) by mouth 2 (two) times daily. 09/09/19   Pokhrel, Corrie Mckusick, MD  diclofenac Sodium (VOLTAREN) 1 % GEL APPLY 4 G TOPICALLY 2 (TWO) TIMES DAILY AS NEEDED. 10/16/19   Milus Banister C, DO  divalproex (DEPAKOTE) 250 MG DR tablet Take 1 tablet (250 mg total) by mouth 2 (two) times daily. Patient taking differently: Take 500 mg by mouth daily.  02/17/19   Daisy Floro, DO  gabapentin (NEURONTIN) 300 MG capsule TAKE 1 CAPSULE BY MOUTH EVERYDAY AT BEDTIME Patient taking differently: Take 300 mg by mouth at bedtime.  02/17/19   Milus Banister C, DO  imipramine (TOFRANIL) 25 MG tablet Take 50 mg by mouth at bedtime.     [provider]  omeprazole (PRILOSEC) 20 MG capsule Take 20 mg by mouth daily.    [provider]  ondansetron (ZOFRAN) 4 MG tablet Take 1 tablet (4 mg total) by mouth every 8 (eight) hours as needed for nausea or vomiting. 09/09/19   Pokhrel, Corrie Mckusick, MD  predniSONE (DELTASONE) 5 MG tablet Take 5 mg by mouth daily.     [provider]  tacrolimus (PROGRAF) 1 MG capsule Take 2 mg by mouth 2 (two) times daily.     [provider]  divalproex (DEPAKOTE) 250 MG DR tablet TAKE 1 TABLET BY MOUTH TWICE A DAY 08/12/18   Diallo, Abdoulaye, MD  gabapentin (NEURONTIN) 300 MG capsule TAKE 1 CAPSULE BY MOUTH EVERYDAY AT BEDTIME 08/12/18   Diallo, Abdoulaye, MD    Allergies    Erythromycin and Sulfa antibiotics  Review of Systems   Review of Systems  Constitutional: Negative for appetite change and fatigue.  HENT: Negative for congestion, ear discharge and sinus pressure.   Eyes: Negative for discharge.  Respiratory: Positive for cough.   Cardiovascular: Negative for chest pain.  Gastrointestinal: Negative for abdominal pain and diarrhea.  Genitourinary: Negative for frequency and hematuria.  Musculoskeletal: Negative for back pain.  Skin: Negative for rash.  Neurological:  Negative for seizures and headaches.  Psychiatric/Behavioral: Negative for hallucinations.    Physical Exam Updated Vital Signs BP 140/90   Pulse 95   Temp (!) 102.7 F (39.3 C) (Oral)   Resp 20   Ht 6' (1.829 m)   Wt 65.8 kg   SpO2 97%   BMI 19.67 kg/m   Physical Exam Vitals and nursing note reviewed.  Constitutional:      Appearance: He is well-developed.  HENT:     Head: Normocephalic.     Nose: Nose normal.  Eyes:  General: No scleral icterus.    Conjunctiva/sclera: Conjunctivae normal.  Neck:     Thyroid: No thyromegaly.  Cardiovascular:     Rate and Rhythm: Regular rhythm. Tachycardia present.     Heart sounds: No murmur heard.  No friction rub. No gallop.   Pulmonary:     Breath sounds: No stridor. No wheezing or rales.  Chest:     Chest wall: No tenderness.  Abdominal:     General: There is no distension.     Tenderness: There is no abdominal tenderness. There is no rebound.  Musculoskeletal:        General: Normal range of motion.     Cervical back: Neck supple.  Lymphadenopathy:     Cervical: No cervical adenopathy.  Skin:    Findings: No erythema or rash.  Neurological:     Mental Status: He is alert and oriented to person, place, and time.     Motor: No abnormal muscle tone.     Coordination: Coordination normal.  Psychiatric:        Behavior: Behavior normal.     ED Results / Procedures / Treatments   Labs (all labs ordered are listed, but only abnormal results are displayed) Labs Reviewed  CBC WITH DIFFERENTIAL/PLATELET - Abnormal; Notable for the following components:      Result Value   WBC 25.1 (*)    Hemoglobin 12.5 (*)    HCT 37.5 (*)    Neutro Abs 21.1 (*)    Monocytes Absolute 1.7 (*)    Eosinophils Absolute 0.7 (*)    Abs Immature Granulocytes 0.13 (*)    All other components within normal limits  COMPREHENSIVE METABOLIC PANEL - Abnormal; Notable for the following components:   Sodium 133 (*)    CO2 20 (*)    Glucose,  Bld 111 (*)    BUN 22 (*)    Creatinine, Ser 2.15 (*)    Total Bilirubin 1.6 (*)    GFR calc non Af Amer 36 (*)    GFR calc Af Amer 42 (*)    All other components within normal limits  APTT - Abnormal; Notable for the following components:   aPTT 41 (*)    All other components within normal limits  SARS CORONAVIRUS 2 BY RT PCR (HOSPITAL ORDER, [facility])  CULTURE, BLOOD (ROUTINE X 2)  CULTURE, BLOOD (ROUTINE X 2)  URINE CULTURE  PROTIME-INR  CBC WITH DIFFERENTIAL/PLATELET  LACTIC ACID, PLASMA  LACTIC ACID, PLASMA  URINALYSIS, ROUTINE W REFLEX MICROSCOPIC    EKG None  Radiology DG Chest Port 1 View  Result Date: 01/03/2020 CLINICAL DATA:  Cough. EXAM: PORTABLE CHEST 1 VIEW COMPARISON:  September 06, 2019 FINDINGS: Infiltrates are seen in the right perihilar region and left base. The opacity in the right perihilar region is somewhat platelike. The heart, hila, mediastinum, lungs, and pleura are otherwise unremarkable. IMPRESSION: Right perihilar and left basilar infiltrates are worrisome for pneumonia given history. The right perihilar opacity is somewhat platelike in atelectasis is a possibility as well. Electronically Signed   By: Dorise Bullion III M.D   On: 01/03/2020 16:58    Procedures Procedures (including critical care time)  Medications Ordered in ED Medications  lactated ringers infusion (has no administration in time range)  cefTRIAXone (ROCEPHIN) 2 g in sodium chloride 0.9 % 100 mL IVPB (2 g Intravenous New Bag/Given 01/03/20 1740)  lactated ringers bolus 1,000 mL (1,000 mLs Intravenous New Bag/Given 01/03/20 1725)  And  lactated ringers bolus 1,000 mL (has no administration in time range)  doxycycline (VIBRAMYCIN) 100 mg in sodium chloride 0.9 % 250 mL IVPB (has no administration in time range)  acetaminophen (TYLENOL) tablet 650 mg (650 mg Oral Given 01/03/20 1429)  acetaminophen (TYLENOL) tablet 650 mg (650 mg Oral Given 01/03/20 1714)     ED Course  I have reviewed the triage vital signs and the nursing notes.  Pertinent labs & imaging results that were available during my care of the patient were reviewed by me and considered in my medical decision making (see chart for details).    MDM Rules/Calculators/A&P                          Patient with bilateral pneumonia and leukocytosis.  He will be admitted for IV antibiotics        This patient presents to the ED for concern of cough and fever, this involves an extensive number of treatment options, and is a complaint that carries with it a high risk of complications and morbidity.  The differential diagnosis includes pneumonia.  COVID-19   Lab Tests:   I Ordered, reviewed, and interpreted labs, which included CBC and chemistries that showed elevated white count 25,000 and mild anemia  Medicines ordered:   I ordered medication lactated Ringer's for dehydration and Rocephin and doxycycline for pneumonia  Imaging Studies ordered:   I ordered imaging studies which included chest x-ray  I independently visualized and interpreted imaging which showed pneumonia  Additional history obtained:   Additional history obtained from records  Previous records obtained and reviewed.  Consultations Obtained:   I consulted hospitalist and discussed lab and imaging findings  Reevaluation:  After the interventions stated above, I reevaluated the patient and found mild improvement  Critical Interventions:  .   Final Clinical Impression(s) / ED Diagnoses Final diagnoses:  Community acquired pneumonia, unspecified laterality    Rx / DC Orders ED Discharge Orders    None       Milton Ferguson, MD 01/03/20 1745

## 2020-01-04 DIAGNOSIS — Q851 Tuberous sclerosis: Secondary | ICD-10-CM

## 2020-01-04 DIAGNOSIS — I10 Essential (primary) hypertension: Secondary | ICD-10-CM

## 2020-01-04 DIAGNOSIS — N1832 Chronic kidney disease, stage 3b: Secondary | ICD-10-CM

## 2020-01-04 DIAGNOSIS — A419 Sepsis, unspecified organism: Principal | ICD-10-CM

## 2020-01-04 LAB — BASIC METABOLIC PANEL
Anion gap: 8 (ref 5–15)
BUN: 19 mg/dL (ref 6–20)
CO2: 22 mmol/L (ref 22–32)
Calcium: 9 mg/dL (ref 8.9–10.3)
Chloride: 104 mmol/L (ref 98–111)
Creatinine, Ser: 1.88 mg/dL — ABNORMAL HIGH (ref 0.61–1.24)
GFR calc Af Amer: 49 mL/min — ABNORMAL LOW (ref >60)
GFR calc non Af Amer: 42 mL/min — ABNORMAL LOW (ref >60)
Glucose, Bld: 122 mg/dL — ABNORMAL HIGH (ref 70–99)
Potassium: 4.4 mmol/L (ref 3.5–5.1)
Sodium: 134 mmol/L — ABNORMAL LOW (ref 135–145)

## 2020-01-04 LAB — CBC
HCT: 32.5 % — ABNORMAL LOW (ref 39.0–52.0)
Hemoglobin: 10.6 g/dL — ABNORMAL LOW (ref 13.0–17.0)
MCH: 29 pg (ref 26.0–34.0)
MCHC: 32.6 g/dL (ref 30.0–36.0)
MCV: 88.8 fL (ref 80.0–100.0)
Platelets: 194 10*3/uL (ref 150–400)
RBC: 3.66 MIL/uL — ABNORMAL LOW (ref 4.22–5.81)
RDW: 13.4 % (ref 11.5–15.5)
WBC: 17.5 10*3/uL — ABNORMAL HIGH (ref 4.0–10.5)
nRBC: 0 % (ref 0.0–0.2)

## 2020-01-04 MED ORDER — ASPIRIN EC 81 MG PO TBEC
81.0000 mg | DELAYED_RELEASE_TABLET | Freq: Every day | ORAL | Status: DC
  Administered 2020-01-04 – 2020-01-06 (×3): 81 mg via ORAL
  Filled 2020-01-04 (×3): qty 1

## 2020-01-04 MED ORDER — IMIPRAMINE HCL 25 MG PO TABS
50.0000 mg | ORAL_TABLET | Freq: Every day | ORAL | Status: DC
  Administered 2020-01-04 – 2020-01-05 (×2): 50 mg via ORAL
  Filled 2020-01-04 (×2): qty 2

## 2020-01-04 MED ORDER — GABAPENTIN 300 MG PO CAPS
300.0000 mg | ORAL_CAPSULE | Freq: Every day | ORAL | Status: DC
  Administered 2020-01-04 – 2020-01-05 (×2): 300 mg via ORAL
  Filled 2020-01-04 (×2): qty 1

## 2020-01-04 MED ORDER — TRAZODONE HCL 50 MG PO TABS
50.0000 mg | ORAL_TABLET | Freq: Every day | ORAL | Status: DC
  Administered 2020-01-04 – 2020-01-05 (×2): 50 mg via ORAL
  Filled 2020-01-04 (×2): qty 1

## 2020-01-04 MED ORDER — PANTOPRAZOLE SODIUM 40 MG PO TBEC
40.0000 mg | DELAYED_RELEASE_TABLET | Freq: Every day | ORAL | Status: DC
  Administered 2020-01-04 – 2020-01-06 (×3): 40 mg via ORAL
  Filled 2020-01-04 (×3): qty 1

## 2020-01-04 MED ORDER — AMLODIPINE BESYLATE 5 MG PO TABS: 5.0000 mg | ORAL_TABLET | Freq: Every day | ORAL | Status: DC

## 2020-01-04 MED ORDER — SODIUM CHLORIDE 0.9 % IV SOLN
INTRAVENOUS | Status: DC | PRN
  Administered 2020-01-04: 1000 mL via INTRAVENOUS

## 2020-01-04 MED ORDER — PREDNISONE 10 MG PO TABS
10.0000 mg | ORAL_TABLET | Freq: Every day | ORAL | Status: DC
  Administered 2020-01-05 – 2020-01-06 (×2): 10 mg via ORAL
  Filled 2020-01-04 (×2): qty 1

## 2020-01-04 NOTE — Progress Notes (Signed)
PROGRESS NOTE    Tyler Alvarez  HLK:[id number] DOB: 01/29/1976 DOA: 01/03/2020 PCP: Daisy Floro, DO    Chief Complaint  Patient presents with  . Emesis    Brief Narrative:  As per H&P written by Dr. Denton Brick on 01/03/2020 Tyler Alvarez is a 44 y.o. male with medical history significant forTubero sclerosis, renal transplant, seizures, CKD 3, hypertension.  Patient presented to the ED with complaints of cough and fever of 2 days duration.  He also reports generalized weakness.  One episode of vomiting prior to arrival in the ED.  No pain with urination. 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    ED Course: Temperature 102.7, heart rate 90s to 112, blood pressure systolic 734K to 876O.  O2 sats greater than 96% on room air.  Lactic acid normal 1.1.  WBC 25.  Creatinine 2.15.  Portable chest x-ray right perihilar and left basilar infiltrates worrisome for pneumonia.   2 L bolus given.  IV ceftriaxone and doxycycline started.  Hospitalist to admit for pneumonia.  Assessment & Plan: 1-sepsis secondary to community-acquired pneumonia -Patient with underlying history of renal transplant and immunosuppression -Continue current IV antibiotic -Continue bronchodilators management, mucolytic's and supportive care -Still spiking fever, WBC is trending down appropriate -No requiring oxygen supplementation. -Patient allergy to erythromycin.  2-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HCC)/seizure disorder -Continue Depakote and gabapentin -Continue supportive care. -No seizures reported in multiple years.  3-chronic kidney disease a stage IIIb -Status post renal transplant -Baseline creatinine 1.6-1.9 -Appears to be at baseline -Continue Prograf and current dose of his steroids.  4-essential hypertension -Continue Norvasc  -Blood pressure stable at this time. -Follow vital signs and adjust medication as needed.  5-GERD/GI prophylaxis -Continue PPI.  6-anxiety -Will resume  imipramine.   DVT prophylaxis: Lovenox Code Status: Full code Family Communication: No family member at bedside. Disposition:   Status is: Inpatient  Dispo: The patient is from:  home              Anticipated d/c is to: home              Anticipated d/c date is: 1-2 days              Patient currently No medically stable for discharge, still experiencing low-grade temperature, productive coughing spells and shortness of breath with activity.  Continue current IV antibiotics and supportive care.    Consultants:   None   Procedures:  See below for x-ray reports.   Antimicrobials:  Rocephin and doxycycline since admission 01/03/2020   Subjective: Still short of breath with activity, experiencing intermittent coughing spells and having low-grade temperature. No requiring oxygen supplementation.  No chest pain, no nausea vomiting.  Objective: Vitals:   01/04/20 0250 01/04/20 0650 01/04/20 0911 01/04/20 1317  BP: 124/73 (!) 151/89 (!) 143/99 131/89  Pulse: (!) 102 84 92 90  Resp: 18 18 16 17   Temp: 99.6 F (37.6 C) 99.3 F (37.4 C) 99.6 F (37.6 C) 98.2 F (36.8 C)  TempSrc: Oral Oral Oral   SpO2: 97% 98% 99% 99%  Weight:      Height:        Intake/Output Summary (Last 24 hours) at 01/04/2020 1612 Last data filed at 01/04/2020 0900 Gross per 24 hour  Intake 2896.98 ml  Output 300 ml  Net 2596.98 ml   Filed Weights   01/03/20 1424 01/03/20 2250  Weight: 65.8 kg 60.9 kg    Examination:  General exam: Appears calm and comfortable, with  low-grade temperature and expressing feeling short of breath with activity.  Still with productive coughing spells. Respiratory system: No wheezing, no using accessory muscles; positive diffuse rhonchi bilaterally.  Good oxygen saturation on room air. Cardiovascular system: S1 & S2 heard, RRR. No JVD, murmurs, rubs, gallops or clicks. No pedal edema. Gastrointestinal system: Abdomen is nondistended, soft and nontender. No  organomegaly or masses felt. Normal bowel sounds heard. Central nervous system: Alert and oriented. No focal neurological deficits. Extremities: No cyanosis or clubbing. Skin: No rashes, no petechiae. Psychiatry: Judgement and insight appear normal. Mood & affect appropriate.     Data Reviewed: I have personally reviewed following labs and imaging studies  CBC: Recent Labs  Lab 01/03/20 1522 01/04/20 0612  WBC 25.1* 17.5*  NEUTROABS 21.1*  --   HGB 12.5* 10.6*  HCT 37.5* 32.5*  MCV 87.4 88.8  PLT 218 161    Basic Metabolic Panel: Recent Labs  Lab 01/03/20 1522 01/04/20 0612  NA 133* 134*  K 4.0 4.4  CL 102 104  CO2 20* 22  GLUCOSE 111* 122*  BUN 22* 19  CREATININE 2.15* 1.88*  CALCIUM 8.9 9.0    GFR: Estimated Creatinine Clearance: 42.7 mL/min (A) (by C-G formula based on SCr of 1.88 mg/dL (H)).  Liver Function Tests: Recent Labs  Lab 01/03/20 1522  AST 24  ALT 30  ALKPHOS 110  BILITOT 1.6*  PROT 7.3  ALBUMIN 3.5    CBG: No results for input(s): GLUCAP in the last 168 hours.   Recent Results (from the past 240 hour(s))  SARS Coronavirus 2 by RT PCR (hospital order, performed in [facility] hospital lab) Nasopharyngeal Nasopharyngeal Swab     Status: None   Collection Time: 01/03/20  2:33 PM   Specimen: Nasopharyngeal Swab  Result Value Ref Range Status   SARS Coronavirus 2 NEGATIVE NEGATIVE Final    Comment: (NOTE) SARS-CoV-2 target nucleic acids are NOT DETECTED.  The SARS-CoV-2 RNA is generally detectable in upper and lower respiratory specimens during the acute phase of infection. The lowest concentration of SARS-CoV-2 viral copies this assay can detect is 250 copies / mL. A negative result does not preclude SARS-CoV-2 infection and should not be used as the sole basis for treatment or other patient management decisions.  A negative result may occur with improper specimen collection / handling, submission of specimen other than  nasopharyngeal swab, presence of viral mutation(s) within the areas targeted by this assay, and inadequate number of viral copies (<250 copies / mL). A negative result must be combined with clinical observations, patient history, and epidemiological information.  Fact Sheet for Patients:   StrictlyIdeas.no  Fact Sheet for Healthcare Providers: BankingDealers.co.za  This test is not yet approved or  cleared by the [place] FDA and has been authorized for detection and/or diagnosis of SARS-CoV-2 by FDA under an Emergency Use Authorization (EUA).  This EUA will remain in effect (meaning this test can be used) for the duration of the COVID-19 declaration under Section 564(b)(1) of the Act, 21 U.S.C. section 360bbb-3(b)(1), unless the authorization is terminated or revoked sooner.  Performed at [facility], [address], [place],  [address]   Blood Culture (routine x 2)     Status: None (Preliminary result)   Collection Time: 01/03/20  5:11 PM   Specimen: BLOOD  Result Value Ref Range Status   Specimen Description BLOOD RIGHT WRIST  Final   Special Requests   Final    BOTTLES DRAWN AEROBIC AND ANAEROBIC  Blood Culture adequate volume   Culture   Final    NO GROWTH < 24 HOURS Performed at [facility], [address], [place], [place] [address]    Report Status PENDING  Incomplete  Blood Culture (routine x 2)     Status: None (Preliminary result)   Collection Time: 01/03/20  5:23 PM   Specimen: BLOOD  Result Value Ref Range Status   Specimen Description BLOOD RIGHT ANTECUBITAL  Final   Special Requests   Final    BOTTLES DRAWN AEROBIC AND ANAEROBIC Blood Culture adequate volume   Culture   Final    NO GROWTH < 24 HOURS Performed at [facility], [address], [place], [place] [address]    Report Status PENDING  Incomplete     Radiology Studies: DG Chest Port 1 View  Result Date: 01/03/2020 CLINICAL DATA:  Cough.  EXAM: PORTABLE CHEST 1 VIEW COMPARISON:  September 06, 2019 FINDINGS: Infiltrates are seen in the right perihilar region and left base. The opacity in the right perihilar region is somewhat platelike. The heart, hila, mediastinum, lungs, and pleura are otherwise unremarkable. IMPRESSION: Right perihilar and left basilar infiltrates are worrisome for pneumonia given history. The right perihilar opacity is somewhat platelike in atelectasis is a possibility as well. Electronically Signed   By: Dorise Bullion III M.D   On: 01/03/2020 16:58    Scheduled Meds: . amLODipine  5 mg Oral Daily  . divalproex  250 mg Oral Daily  . enoxaparin (LOVENOX) injection  40 mg Subcutaneous Q24H  . gabapentin  300 mg Oral QHS  . guaiFENesin-dextromethorphan  10 mL Oral Q8H  . hydrocortisone sod succinate (SOLU-CORTEF) inj  40 mg Intravenous Q8H  . losartan  50 mg Oral BID  . tacrolimus  2 mg Oral BID   Continuous Infusions: . sodium chloride 1,000 mL (01/04/20 1523)  . cefTRIAXone (ROCEPHIN)  IV    . doxycycline (VIBRAMYCIN) IV 100 mg (01/04/20 0630)     LOS: 1 day    Time spent: 35 minutes   Barton Dubois, MD [facility]   To contact the attending provider between 7A-7P or the covering provider during after hours 7P-7A, please log into the web site [email or web address] and access using universal [place] password for that web site. If you do not have the password, please call the hospital operator.  01/04/2020, 4:12 PM

## 2020-01-04 NOTE — Progress Notes (Signed)
Tele called with report of 6 beat run of SVT, contacted Dr. Dyann Kief.   Also, C/O hiccups and says he takes alkaseltzer at home.  Contacted Dr. Dyann Kief for this.

## 2020-01-05 DIAGNOSIS — Z79899 Other long term (current) drug therapy: Principal | ICD-10-CM

## 2020-01-05 DIAGNOSIS — Z940 Kidney transplant status: Principal | ICD-10-CM

## 2020-01-05 LAB — CBC
HCT: 26.5 % — ABNORMAL LOW (ref 39.0–52.0)
Hemoglobin: 8.6 g/dL — ABNORMAL LOW (ref 13.0–17.0)
MCH: 29.1 pg (ref 26.0–34.0)
MCHC: 32.5 g/dL (ref 30.0–36.0)
MCV: 89.5 fL (ref 80.0–100.0)
Platelets: 164 10*3/uL (ref 150–400)
RBC: 2.96 MIL/uL — ABNORMAL LOW (ref 4.22–5.81)
RDW: 13.3 % (ref 11.5–15.5)
WBC: 9.4 10*3/uL (ref 4.0–10.5)
nRBC: 0 % (ref 0.0–0.2)

## 2020-01-05 LAB — BASIC METABOLIC PANEL
Anion gap: 7 (ref 5–15)
BUN: 20 mg/dL (ref 6–20)
CO2: 19 mmol/L — ABNORMAL LOW (ref 22–32)
Calcium: 7 mg/dL — ABNORMAL LOW (ref 8.9–10.3)
Chloride: 112 mmol/L — ABNORMAL HIGH (ref 98–111)
Creatinine, Ser: 1.82 mg/dL — ABNORMAL HIGH (ref 0.61–1.24)
GFR calc Af Amer: 51 mL/min — ABNORMAL LOW (ref >60)
GFR calc non Af Amer: 44 mL/min — ABNORMAL LOW (ref >60)
Glucose, Bld: 93 mg/dL (ref 70–99)
Potassium: 3.2 mmol/L — ABNORMAL LOW (ref 3.5–5.1)
Sodium: 138 mmol/L (ref 135–145)

## 2020-01-05 LAB — URINE CULTURE: Culture: NO GROWTH

## 2020-01-05 LAB — MAGNESIUM: Magnesium: 1.2 mg/dL — ABNORMAL LOW (ref 1.7–2.4)

## 2020-01-05 MED ORDER — DM-GUAIFENESIN ER 30-600 MG PO TB12
1.0000 | ORAL_TABLET | Freq: Two times a day (BID) | ORAL | Status: DC
  Administered 2020-01-05 – 2020-01-06 (×2): 1 via ORAL
  Filled 2020-01-05 (×2): qty 1

## 2020-01-05 NOTE — Progress Notes (Signed)
PROGRESS NOTE    Tyler Alvarez  IDP:[id number] DOB: 1975/11/14 DOA: 01/03/2020 PCP: Daisy Floro, DO    Chief Complaint  Patient presents with  . Emesis    Brief Narrative:  As per H&P written by Dr. Denton Brick on 01/03/2020 Tyler Alvarez is a 44 y.o. male with medical history significant forTubero sclerosis, renal transplant, seizures, CKD 3, hypertension.  Patient presented to the ED with complaints of cough and fever of 2 days duration.  He also reports generalized weakness.  One episode of vomiting prior to arrival in the ED.  No pain with urination. 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    ED Course: Temperature 102.7, heart rate 90s to 112, blood pressure systolic 443X to 540G.  O2 sats greater than 96% on room air.  Lactic acid normal 1.1.  WBC 25.  Creatinine 2.15.  Portable chest x-ray right perihilar and left basilar infiltrates worrisome for pneumonia.   2 L bolus given.  IV ceftriaxone and doxycycline started.  Hospitalist to admit for pneumonia.  Assessment & Plan: 1-sepsis secondary to community-acquired pneumonia -Patient with underlying history of renal transplant and immunosuppression -Continue current IV antibiotic for another 24 hours. -Continue bronchodilators management, mucolytic's and supportive care -low grade temp overnight, WBC is trending down appropriately, hemodynamically stable. -No requiring oxygen supplementation. -Patient allergic to erythromycin.  2-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HCC)/seizure disorder -Continue Depakote and gabapentin -Continue supportive care. -No seizures reported in multiple years.  3-chronic kidney disease a stage IIIb -Status post renal transplant -Baseline creatinine 1.6-1.9, currently 1.82 -Appears to be at baseline -Continue Prograf and current dose of his steroids.  4-essential hypertension -Continue Norvasc  -Blood pressure stable at this time. -Follow vital signs and adjust medication as  needed.  5-GERD/GI prophylaxis -Continue PPI.  6-anxiety -Will continue imipramine.   DVT prophylaxis: Lovenox Code Status: Full code Family Communication: No family member at bedside. Disposition:   Status is: Inpatient  Dispo: The patient is from:  home              Anticipated d/c is to: home              Anticipated d/c date is: 1 day              Patient currently No medically stable for discharge, still experiencing low-grade temperature, productive coughing spells and shortness of breath with activity.  Continue current IV antibiotics and supportive care.    Consultants:   None   Procedures:  See below for x-ray reports.   Antimicrobials:  Rocephin and doxycycline since admission 01/03/2020   Subjective: Patient reporting shortness of breath and ongoing coughing spells; low-grade temperature overnight.  No chest pain, no nausea, no vomiting.  Objective: Vitals:   01/04/20 1729 01/04/20 2054 01/05/20 0503 01/05/20 1400  BP: (!) 152/93 (!) 141/83 119/86 131/84  Pulse: 92 100 81 100  Resp: 16 18 20 18   Temp: 98.8 F (37.1 C) 98.7 F (37.1 C) 97.8 F (36.6 C) 98.4 F (36.9 C)  TempSrc: Oral  Oral Oral  SpO2: 96% 96% 97% 97%  Weight:      Height:        Intake/Output Summary (Last 24 hours) at 01/05/2020 1805 Last data filed at 01/05/2020 1500 Gross per 24 hour  Intake 1482.12 ml  Output --  Net 1482.12 ml   Filed Weights   01/03/20 1424 01/03/20 2250  Weight: 65.8 kg 60.9 kg    Examination: General exam: Alert, awake, oriented x  3, reporting improvement in his breathing, low-grade temperature overnight.  No chest pain, no nausea, no vomiting.  Still complaining of ongoing productive coughing spells.  Shortness of breath and fatigue with exertion. Respiratory system: Diffuse rhonchi bilaterally, no wheezing, no using accessory muscle.  Good oxygen saturation on room air. Cardiovascular system:RRR. No murmurs, rubs, gallops.  No  JVD. Gastrointestinal system: Abdomen is nondistended, soft and nontender. No organomegaly or masses felt. Normal bowel sounds heard. Central nervous system: Alert and oriented. No focal neurological deficits. Extremities: No cyanosis or clubbing. Skin: No rashes, no petechiae. Psychiatry: Judgement and insight appear normal. Mood & affect appropriate.    Data Reviewed: I have personally reviewed following labs and imaging studies  CBC: Recent Labs  Lab 01/03/20 1522 01/04/20 0612 01/05/20 0533  WBC 25.1* 17.5* 9.4  NEUTROABS 21.1*  --   --   HGB 12.5* 10.6* 8.6*  HCT 37.5* 32.5* 26.5*  MCV 87.4 88.8 89.5  PLT 218 194 332    Basic Metabolic Panel: Recent Labs  Lab 01/03/20 1522 01/04/20 0612 01/05/20 0533  NA 133* 134* 138  K 4.0 4.4 3.2*  CL 102 104 112*  CO2 20* 22 19*  GLUCOSE 111* 122* 93  BUN 22* 19 20  CREATININE 2.15* 1.88* 1.82*  CALCIUM 8.9 9.0 7.0*  MG  --   --  1.2*    GFR: Estimated Creatinine Clearance: 44.2 mL/min (A) (by C-G formula based on SCr of 1.82 mg/dL (H)).  Liver Function Tests: Recent Labs  Lab 01/03/20 1522  AST 24  ALT 30  ALKPHOS 110  BILITOT 1.6*  PROT 7.3  ALBUMIN 3.5    CBG: No results for input(s): GLUCAP in the last 168 hours.   Recent Results (from the past 240 hour(s))  SARS Coronavirus 2 by RT PCR (hospital order, performed in [facility] hospital lab) Nasopharyngeal Nasopharyngeal Swab     Status: None   Collection Time: 01/03/20  2:33 PM   Specimen: Nasopharyngeal Swab  Result Value Ref Range Status   SARS Coronavirus 2 NEGATIVE NEGATIVE Final    Comment: (NOTE) SARS-CoV-2 target nucleic acids are NOT DETECTED.  The SARS-CoV-2 RNA is generally detectable in upper and lower respiratory specimens during the acute phase of infection. The lowest concentration of SARS-CoV-2 viral copies this assay can detect is 250 copies / mL. A negative result does not preclude SARS-CoV-2 infection and should not be used as  the sole basis for treatment or other patient management decisions.  A negative result may occur with improper specimen collection / handling, submission of specimen other than nasopharyngeal swab, presence of viral mutation(s) within the areas targeted by this assay, and inadequate number of viral copies (<250 copies / mL). A negative result must be combined with clinical observations, patient history, and epidemiological information.  Fact Sheet for Patients:   StrictlyIdeas.no  Fact Sheet for Healthcare Providers: BankingDealers.co.za  This test is not yet approved or  cleared by the [place] FDA and has been authorized for detection and/or diagnosis of SARS-CoV-2 by FDA under an Emergency Use Authorization (EUA).  This EUA will remain in effect (meaning this test can be used) for the duration of the COVID-19 declaration under Section 564(b)(1) of the Act, 21 U.S.C. section 360bbb-3(b)(1), unless the authorization is terminated or revoked sooner.  Performed at [facility], [address], [place], [place] [address]   Urine culture     Status: None   Collection Time: 01/03/20  4:51 PM   Specimen:  In/Out Cath Urine  Result Value Ref Range Status   Specimen Description   Final    IN/OUT CATH URINE Performed at [facility], [address], [place], [place] [address]    Special Requests   Final    NONE Performed at [facility], [address], [place], [place] [address]    Culture   Final    NO GROWTH Performed at [facility], [place] [address] [place], [place] [address]    Report Status 01/05/2020 FINAL  Final  Blood Culture (routine x 2)     Status: None (Preliminary result)   Collection Time: 01/03/20  5:11 PM   Specimen: BLOOD  Result Value Ref Range Status   Specimen Description BLOOD RIGHT WRIST  Final   Special Requests   Final    BOTTLES DRAWN AEROBIC AND ANAEROBIC Blood Culture adequate volume   Culture    Final    NO GROWTH < 24 HOURS Performed at [facility], [address], [place], [place] [address]    Report Status PENDING  Incomplete  Blood Culture (routine x 2)     Status: None (Preliminary result)   Collection Time: 01/03/20  5:23 PM   Specimen: BLOOD  Result Value Ref Range Status   Specimen Description BLOOD RIGHT ANTECUBITAL  Final   Special Requests   Final    BOTTLES DRAWN AEROBIC AND ANAEROBIC Blood Culture adequate volume   Culture   Final    NO GROWTH < 24 HOURS Performed at [facility], [address], [place],  [address]    Report Status PENDING  Incomplete     Radiology Studies: No results found.  Scheduled Meds: . amLODipine  5 mg Oral Daily  . aspirin EC  81 mg Oral Daily  . divalproex  250 mg Oral Daily  . enoxaparin (LOVENOX) injection  40 mg Subcutaneous Q24H  . gabapentin  300 mg Oral QHS  . imipramine  50 mg Oral QHS  . pantoprazole  40 mg Oral Daily  . predniSONE  10 mg Oral Daily  . tacrolimus  2 mg Oral BID  . traZODone  50 mg Oral QHS   Continuous Infusions: . sodium chloride 10 mL/hr at 01/05/20 0340  . cefTRIAXone (ROCEPHIN)  IV 1 g (01/05/20 1704)  . doxycycline (VIBRAMYCIN) IV 100 mg (01/05/20 0500)     LOS: 2 days    Time spent: 30 minutes   Barton Dubois, MD [facility]   To contact the attending provider between 7A-7P or the covering provider during after hours 7P-7A, please log into the web site [email or web address] and access using universal [place] password for that web site. If you do not have the password, please call the hospital operator.  01/05/2020, 6:05 PM

## 2020-01-05 NOTE — Care Management Important Message (Signed)
Important Message  Patient Details  Name: Tyler Alvarez MRN: [id number] Date of Birth: [birth year]   Medicare Important Message Given:  Yes     Tommy Medal 01/05/2020, 1:13 PM

## 2020-01-06 DIAGNOSIS — K219 Gastro-esophageal reflux disease without esophagitis: Secondary | ICD-10-CM

## 2020-01-06 DIAGNOSIS — A419 Sepsis, unspecified organism: Secondary | ICD-10-CM

## 2020-01-06 MED ORDER — IPRATROPIUM-ALBUTEROL 20-100 MCG/ACT IN AERS: 1.0000 | INHALATION_SPRAY | Freq: Four times a day (QID) | RESPIRATORY_TRACT | 1 refills | Status: DC | PRN

## 2020-01-06 MED ORDER — DM-GUAIFENESIN ER 30-600 MG PO TB12: 1.0000 | ORAL_TABLET | Freq: Two times a day (BID) | ORAL | 0 refills | Status: DC

## 2020-01-06 MED ORDER — CEFDINIR 300 MG PO CAPS: 300.0000 mg | ORAL_CAPSULE | Freq: Two times a day (BID) | ORAL | 0 refills | Status: AC

## 2020-01-06 MED ORDER — DOXYCYCLINE MONOHYDRATE 100 MG PO TABS: 100.0000 mg | ORAL_TABLET | Freq: Two times a day (BID) | ORAL | 0 refills | Status: AC

## 2020-01-06 NOTE — Progress Notes (Signed)
IV x2 removed. D/C instructions reviewed with patient, verbalized understanding. Patient to be transported to private vehicle via wheelchair, will continue to monitor.

## 2020-01-06 NOTE — Discharge Summary (Signed)
Physician Discharge Summary  Tyler Alvarez:[id number] DOB: [birth year] DOA: 01/03/2020  PCP: Tyler Floro, DO  Admit date: 01/03/2020 Discharge date: 01/06/2020  Time spent: 35 minutes  Recommendations for Outpatient Follow-up:  1. Repeat basic metabolic panel to follow electrolytes and renal function 2. Repeat chest x-ray in 6 weeks to assure complete resolution of infiltrates 3. Repeat CBC to follow stability of WBCs. 4. Reassess blood pressure and adjust antihypertensive regimen as needed.   Discharge Diagnoses:  Principal Problem:   Community acquired pneumonia Active Problems:   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[place])   Essential hypertension   Gout   CKD (chronic kidney disease), stage III   Sepsis without acute organ dysfunction (HCC)   Gastroesophageal reflux disease   Discharge Condition: Stable and improved. Discharged home with instruction to follow-up with PCP in 10 days.  CODE STATUS: Full code.  Diet recommendation: Heart healthy diet.  Filed Weights   01/03/20 1424 01/03/20 2250  Weight: 65.8 kg 60.9 kg    History of present illness:  As per H&P written by Dr. Denton Brick on 01/03/2020 Tyler Bushy Ladeauis a 44 y.o.malewith medical history significant forTubero sclerosis, renal transplant, seizures, CKD 3, hypertension.  Patient presented to the ED with complaints of cough and fever of 2 days duration.He also reports generalized weakness.One episode of vomiting prior to arrival in the ED. No pain with urination. 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  ED Course:Temperature 102.7, heart rate 90s to 112, blood pressure systolic 650P to 546F. O2 sats greater than 96% on room air. Lactic acid normal 1.1. WBC 25. Creatinine 2.15.Portable chest x-ray right perihilar and left basilar infiltrates worrisome for pneumonia. 2 L bolusgiven. IV ceftriaxone and doxycycline started. Hospitalist to admit for pneumonia.  Hospital Course:  1-sepsis  secondary to community-acquired pneumonia -Patient with underlying history of renal transplant and immunosuppression -Afebrile for 24 hours prior to discharge; white blood cells back to normal and expressing no nausea or vomiting. -Patient antibiotics will be transition to oral regimen using cefdinir and doxycycline for 5 more days at time of discharge to complete treatment. -As needed albuterol/ipratropium inhaler has been prescribed to use as needed for shortness of breath or wheezing development in the setting of reactive airway disease for an acute infection.  -Continue the use of dextromethorphan/guaifenesin to assist with coughing and expectoration.  -Patient is allergic to erythromycin.  2-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HCC)/seizure disorder -Continue Depakote and gabapentin -Continue supportive care. -No seizures reported in multiple years.  3-chronic kidney disease a stage IIIb -Status post renal transplant -Baseline creatinine 1.6-1.9, lastly checked was 1.82 -Appears to be at baseline -Continue Prograf and current dose of his steroids. -Patient instructed to maintain adequate hydration and to follow low-sodium diet.Marland Kitchen  4-essential hypertension -Continue Norvasc  -Blood pressure stable at this time. -Reassess blood pressure at follow-up visit and further adjust antihypertensive regimen as required -Heart healthy diet has been encouraged.  5-GERD/GI prophylaxis -Continue PPI.  6-anxiety -Will continue imipramine. -Mood has remained stable, patient denies any acute exacerbation of his condition.   Procedures:  See below for x-ray reports.  Consultations:  None  Discharge Exam: Vitals:   01/05/20 2104 01/06/20 0438  BP: (!) 148/89 125/82  Pulse: 99 84  Resp: 16 17  Temp: 98.8 F (37.1 C) 97.9 F (36.6 C)  SpO2: 97% 96%    General: No fever, speaking in full sentences and no requiring oxygen supplementation. Still with some intermittent coughing spells but no  hemoptysis, and without complaints of nausea/vomiting. Ready  to go home and complete antibiotics by mouth as an outpatient. Cardiovascular: S1 and S2, no rubs, no gallops, no murmurs or JVD appreciated on exam. Respiratory: Improved air movement bilaterally, no using accessory muscles, no wheezing. Positive scattered rhonchi at appreciated on examination. Abdomen: Soft, nontender, nondistended, positive bowel sounds Extremities: No cyanosis or clubbing.  Discharge Instructions   Discharge Instructions    Diet - low sodium heart healthy   Complete by: As directed    Discharge instructions   Complete by: As directed    Take medications as prescribed Maintain adequate hydration Arrange follow-up with PCP in 10 days Follow heart healthy diet.     Allergies as of 01/06/2020      Reactions   Erythromycin Nausea And Vomiting   Sulfa Antibiotics Itching, Nausea And Vomiting      Medication List    STOP taking these medications   losartan 50 MG tablet Commonly known as: COZAAR     TAKE these medications   acetaminophen 500 MG tablet Commonly known as: TYLENOL Take 500 mg by mouth daily as needed for mild pain or headache.   amLODipine 5 MG tablet Commonly known as: NORVASC Take 1 tablet (5 mg total) by mouth daily.   aspirin EC 81 MG tablet Take 81 mg by mouth daily.   brompheniramine-pseudoephedrine 1-15 MG/5ML Elix Commonly known as: DIMETAPP Take 20 mLs by mouth daily as needed for allergies.   cefdinir 300 MG capsule Commonly known as: OMNICEF Take 1 capsule (300 mg total) by mouth 2 (two) times daily for 5 days.   dextromethorphan-guaiFENesin 30-600 MG 12hr tablet Commonly known as: MUCINEX DM Take 1 tablet by mouth 2 (two) times daily.   diclofenac Sodium 1 % Gel Commonly known as: VOLTAREN APPLY 4 G TOPICALLY 2 (TWO) TIMES DAILY AS NEEDED.   divalproex 250 MG DR tablet Commonly known as: DEPAKOTE Take 1 tablet (250 mg total) by mouth 2 (two) times  daily. What changed:   when to take this  additional instructions   doxycycline 100 MG tablet Commonly known as: ADOXA Take 1 tablet (100 mg total) by mouth 2 (two) times daily for 5 days.   gabapentin 300 MG capsule Commonly known as: NEURONTIN TAKE 1 CAPSULE BY MOUTH EVERYDAY AT BEDTIME What changed:   how much to take  how to take this  when to take this  additional instructions   imipramine 25 MG tablet Commonly known as: TOFRANIL Take 50 mg by mouth at bedtime.   Ipratropium-Albuterol 20-100 MCG/ACT Aers respimat Commonly known as: COMBIVENT Inhale 1 puff into the lungs every 6 (six) hours as needed for wheezing or shortness of breath.   omeprazole 20 MG capsule Commonly known as: PRILOSEC Take 20 mg by mouth daily.   predniSONE 5 MG tablet Commonly known as: DELTASONE Take 5 mg by mouth daily.   tacrolimus 1 MG capsule Commonly known as: PROGRAF Take 2 mg by mouth 2 (two) times daily.      Allergies  Allergen Reactions  . Erythromycin Nausea And Vomiting  . Sulfa Antibiotics Itching and Nausea And Vomiting    Follow-up Information    Tyler Floro, DO. Schedule an appointment as soon as possible for a visit in 2 week(s).   Specialty: Family Medicine Contact information: [address] [place] [address] [phone]                The results of significant diagnostics from this hospitalization (including imaging, microbiology, ancillary and laboratory)  are listed below for reference.    Significant Diagnostic Studies: DG Chest Port 1 View  Result Date: 01/03/2020 CLINICAL DATA:  Cough. EXAM: PORTABLE CHEST 1 VIEW COMPARISON:  September 06, 2019 FINDINGS: Infiltrates are seen in the right perihilar region and left base. The opacity in the right perihilar region is somewhat platelike. The heart, hila, mediastinum, lungs, and pleura are otherwise unremarkable. IMPRESSION: Right perihilar and left basilar infiltrates are worrisome  for pneumonia given history. The right perihilar opacity is somewhat platelike in atelectasis is a possibility as well. Electronically Signed   By: Dorise Bullion III M.D   On: 01/03/2020 16:58    Microbiology: Recent Results (from the past 240 hour(s))  SARS Coronavirus 2 by RT PCR (hospital order, performed in [facility] hospital lab) Nasopharyngeal Nasopharyngeal Swab     Status: None   Collection Time: 01/03/20  2:33 PM   Specimen: Nasopharyngeal Swab  Result Value Ref Range Status   SARS Coronavirus 2 NEGATIVE NEGATIVE Final    Comment: (NOTE) SARS-CoV-2 target nucleic acids are NOT DETECTED.  The SARS-CoV-2 RNA is generally detectable in upper and lower respiratory specimens during the acute phase of infection. The lowest concentration of SARS-CoV-2 viral copies this assay can detect is 250 copies / mL. A negative result does not preclude SARS-CoV-2 infection and should not be used as the sole basis for treatment or other patient management decisions.  A negative result may occur with improper specimen collection / handling, submission of specimen other than nasopharyngeal swab, presence of viral mutation(s) within the areas targeted by this assay, and inadequate number of viral copies (<250 copies / mL). A negative result must be combined with clinical observations, patient history, and epidemiological information.  Fact Sheet for Patients:   StrictlyIdeas.no  Fact Sheet for Healthcare Providers: BankingDealers.co.za  This test is not yet approved or  cleared by the [place] FDA and has been authorized for detection and/or diagnosis of SARS-CoV-2 by FDA under an Emergency Use Authorization (EUA).  This EUA will remain in effect (meaning this test can be used) for the duration of the COVID-19 declaration under Section 564(b)(1) of the Act, 21 U.S.C. section 360bbb-3(b)(1), unless the authorization is terminated or revoked  sooner.  Performed at [facility], [address] [place], [place] [address]   Urine culture     Status: None   Collection Time: 01/03/20  4:51 PM   Specimen: In/Out Cath Urine  Result Value Ref Range Status   Specimen Description   Final    IN/OUT CATH URINE Performed at [facility], [address] [place], [place] [address]    Special Requests   Final    NONE Performed at [facility], [address], [place], [place] [address]    Culture   Final    NO GROWTH Performed at [facility], [place] [address] [place], [place] [address]    Report Status 01/05/2020 FINAL  Final  Blood Culture (routine x 2)     Status: None (Preliminary result)   Collection Time: 01/03/20  5:11 PM   Specimen: BLOOD  Result Value Ref Range Status   Specimen Description BLOOD RIGHT WRIST  Final   Special Requests   Final    BOTTLES DRAWN AEROBIC AND ANAEROBIC Blood Culture adequate volume   Culture   Final    NO GROWTH < 24 HOURS Performed at [facility], [address], [place], [place] [address]    Report Status PENDING  Incomplete  Blood Culture (routine x 2)     Status: None (Preliminary result)   Collection Time: 01/03/20  5:23 PM   Specimen: BLOOD  Result Value Ref Range Status   Specimen Description BLOOD RIGHT ANTECUBITAL  Final   Special Requests   Final    BOTTLES DRAWN AEROBIC AND ANAEROBIC Blood Culture adequate volume   Culture   Final    NO GROWTH < 24 HOURS Performed at [facility], [address], [place], [place] [address]    Report Status PENDING  Incomplete     Labs: Basic Metabolic Panel: Recent Labs  Lab 01/03/20 1522 01/04/20 0612 01/05/20 0533  NA 133* 134* 138  K 4.0 4.4 3.2*  CL 102 104 112*  CO2 20* 22 19*  GLUCOSE 111* 122* 93  BUN 22* 19 20  CREATININE 2.15* 1.88* 1.82*  CALCIUM 8.9 9.0 7.0*  MG  --   --  1.2*   Liver Function Tests: Recent Labs  Lab 01/03/20 1522  AST 24  ALT 30  ALKPHOS 110  BILITOT 1.6*  PROT 7.3  ALBUMIN 3.5   No  results for input(s): LIPASE, AMYLASE in the last 168 hours. No results for input(s): AMMONIA in the last 168 hours. CBC: Recent Labs  Lab 01/03/20 1522 01/04/20 0612 01/05/20 0533  WBC 25.1* 17.5* 9.4  NEUTROABS 21.1*  --   --   HGB 12.5* 10.6* 8.6*  HCT 37.5* 32.5* 26.5*  MCV 87.4 88.8 89.5  PLT 218 194 164   Cardiac Enzymes: No results for input(s): CKTOTAL, CKMB, CKMBINDEX, TROPONINI in the last 168 hours. BNP: BNP (last 3 results) No results for input(s): BNP in the last 8760 hours.  ProBNP (last 3 results) No results for input(s): PROBNP in the last 8760 hours.  CBG: No results for input(s): GLUCAP in the last 168 hours.     Signed:  Barton Dubois MD.  [facility] 01/06/2020, 8:15 AM

## 2020-01-08 LAB — CULTURE, BLOOD (ROUTINE X 2)
Culture: NO GROWTH
Culture: NO GROWTH
Special Requests: ADEQUATE
Special Requests: ADEQUATE

## 2020-01-09 DIAGNOSIS — Z940 Kidney transplant status: Principal | ICD-10-CM

## 2020-01-15 NOTE — Progress Notes (Signed)
    SUBJECTIVE:   CHIEF COMPLAINT / HPI:   Follow up for hospitalization  Sepsis secondary to community-acquired pneumonia: Symptoms have resolved, patient has completed treatment course of 5 days of doxycycline, remains afebrile, has comfortable work of breathing.  Has been using over-the-counter Mucinex to help decrease viscosity of mucus, secretions.  Denies any shortness of breath, chest pain, cough, chills, fevers.  He is fully vaccinated against COVID-19 and will be receiving his booster shot shortly.  Chronic kidney disease 3B: Patient with history of chronic kidney disease stage IIIb status post renal transplant.  While patient was hospitalized his baseline creatinine was 1.671.9, was 1.82 prior to discharge, at baseline.  Was recommended to continue Prograf and current dose of his steroids for immunosuppression.  Patient reports he has not been maintaining adequate hydration.  Checked CMP at today's visit which revealed creatinine of 2.01.  Not too far from his baseline, not meeting status for AKI.  Health maintenance: Patient due for hepatitis C antibody screening  PERTINENT  PMH / PSH:  Patient Active Problem List   Diagnosis Date Noted  . Need for immunization against influenza 01/20/2020  . Anemia 01/20/2020  . Electrolyte abnormality 01/20/2020  . Sepsis without acute organ dysfunction ([place])   . Gastroesophageal reflux disease   . Community acquired pneumonia 01/03/2020  . Neck pain 09/18/2019  . Acute-on-chronic kidney injury ([place]) 09/06/2019  . Gout 10/09/2017  . CKD (chronic kidney disease), stage III 10/09/2017  . Normocytic anemia   . Renal transplant, status post   . Essential hypertension   . Aneurysm ([address]) 07/16/2014  . 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 HEARING LOSS, LEFT EAR 11/25/2008    OBJECTIVE:   BP 122/80   Pulse 94   Ht 6' (1.829 m)   Wt 134 lb 4 oz (60.9 kg)   SpO2 99%   BMI 18.21 kg/m    Physical exam: General: Pleasant patient,  nontoxic-appearing Respiratory: CTA bilaterally, normal work of breathing, speaking complete sentences, satting well on room air, no crackles or wheezes appreciated Cardio: RRR, S1-S2 present, no murmurs appreciated Abdomen: Normal bowel sounds appreciated, soft, nontender, no masses appreciated   ASSESSMENT/PLAN:   Community acquired pneumonia Repeat CBC performed at today's follow-up visit shows elevated white cell count, could be due to patient restarting steroids.  Absolute neutrophil count remains elevated however is significantly improved from prior lab.  Patient very well-appearing, no signs/symptoms of pneumonia appreciated on physical exam, remains afebrile.  Has completed course of antibiotics. -Strict ED return precautions provided -Instructed to continue inhalers as prescribed  CKD (chronic kidney disease), stage III Stable, patient reports he has not been hydrating as instructed. -Encourage patient to hydrate, encouraged low-salt diet     [place]

## 2020-01-16 ENCOUNTER — Ambulatory Visit (INDEPENDENT_AMBULATORY_CARE_PROVIDER_SITE_OTHER): Payer: Medicare Other | Admitting: Family Medicine

## 2020-01-16 ENCOUNTER — Encounter: Payer: Self-pay | Admitting: Family Medicine

## 2020-01-16 ENCOUNTER — Other Ambulatory Visit: Payer: Self-pay

## 2020-01-16 VITALS — BP 122/80 | HR 94 | Ht 72.0 in | Wt 134.2 lb

## 2020-01-16 DIAGNOSIS — N1832 Chronic kidney disease, stage 3b: Secondary | ICD-10-CM

## 2020-01-16 DIAGNOSIS — Z23 Encounter for immunization: Secondary | ICD-10-CM | POA: Diagnosis not present

## 2020-01-16 DIAGNOSIS — D619 Aplastic anemia, unspecified: Secondary | ICD-10-CM

## 2020-01-16 DIAGNOSIS — E878 Other disorders of electrolyte and fluid balance, not elsewhere classified: Secondary | ICD-10-CM

## 2020-01-16 DIAGNOSIS — J189 Pneumonia, unspecified organism: Secondary | ICD-10-CM | POA: Diagnosis not present

## 2020-01-17 LAB — BASIC METABOLIC PANEL
BUN/Creatinine Ratio: 15 (ref 9–20)
BUN: 30 mg/dL — ABNORMAL HIGH (ref 6–24)
CO2: 19 mmol/L — ABNORMAL LOW (ref 20–29)
Calcium: 10.2 mg/dL (ref 8.7–10.2)
Chloride: 101 mmol/L (ref 96–106)
Creatinine, Ser: 2.01 mg/dL — ABNORMAL HIGH (ref 0.76–1.27)
GFR calc Af Amer: 45 mL/min/{1.73_m2} — ABNORMAL LOW (ref >59)
GFR calc non Af Amer: 39 mL/min/{1.73_m2} — ABNORMAL LOW (ref >59)
Glucose: 100 mg/dL — ABNORMAL HIGH (ref 65–99)
Potassium: 4.4 mmol/L (ref 3.5–5.2)
Sodium: 138 mmol/L (ref 134–144)

## 2020-01-17 LAB — CBC WITH DIFFERENTIAL
Basophils Absolute: 0.1 10*3/uL (ref 0.0–0.2)
Basos: 1 %
EOS (ABSOLUTE): 0.3 10*3/uL (ref 0.0–0.4)
Eos: 2 %
Hematocrit: 39 % (ref 37.5–51.0)
Hemoglobin: 13.1 g/dL (ref 13.0–17.7)
Immature Grans (Abs): 0.1 10*3/uL (ref 0.0–0.1)
Immature Granulocytes: 1 %
Lymphocytes Absolute: 3.3 10*3/uL — ABNORMAL HIGH (ref 0.7–3.1)
Lymphs: 20 %
MCH: 29.4 pg (ref 26.6–33.0)
MCHC: 33.6 g/dL (ref 31.5–35.7)
MCV: 87 fL (ref 79–97)
Monocytes Absolute: 0.9 10*3/uL (ref 0.1–0.9)
Monocytes: 6 %
Neutrophils Absolute: 11.5 10*3/uL — ABNORMAL HIGH (ref 1.4–7.0)
Neutrophils: 70 %
RBC: 4.46 x10E6/uL (ref 4.14–5.80)
RDW: 13.5 % (ref 11.6–15.4)
WBC: 16.2 10*3/uL — ABNORMAL HIGH (ref 3.4–10.8)

## 2020-01-20 DIAGNOSIS — E878 Other disorders of electrolyte and fluid balance, not elsewhere classified: Secondary | ICD-10-CM

## 2020-01-20 DIAGNOSIS — Z23 Encounter for immunization: Secondary | ICD-10-CM | POA: Insufficient documentation

## 2020-01-20 DIAGNOSIS — D619 Aplastic anemia, unspecified: Secondary | ICD-10-CM | POA: Insufficient documentation

## 2020-01-20 HISTORY — DX: Other disorders of electrolyte and fluid balance, not elsewhere classified: E87.8

## 2020-01-20 NOTE — Assessment & Plan Note (Signed)
Stable, patient reports he has not been hydrating as instructed. -Encourage patient to hydrate, encouraged low-salt diet

## 2020-01-20 NOTE — Assessment & Plan Note (Addendum)
Repeat CBC performed at today's follow-up visit shows elevated white cell count, could be due to patient restarting steroids.  Absolute neutrophil count remains elevated however is significantly improved from prior lab.  Patient very well-appearing, no signs/symptoms of pneumonia appreciated on physical exam, remains afebrile.  Has completed course of antibiotics. -Strict ED return precautions provided -Instructed to continue inhalers as prescribed

## 2020-01-28 DIAGNOSIS — E559 Vitamin D deficiency, unspecified: Secondary | ICD-10-CM | POA: Diagnosis not present

## 2020-01-28 DIAGNOSIS — N2581 Secondary hyperparathyroidism of renal origin: Secondary | ICD-10-CM | POA: Diagnosis not present

## 2020-01-28 DIAGNOSIS — I10 Essential (primary) hypertension: Secondary | ICD-10-CM | POA: Diagnosis not present

## 2020-01-28 DIAGNOSIS — Z940 Kidney transplant status: Secondary | ICD-10-CM | POA: Diagnosis not present

## 2020-01-28 DIAGNOSIS — D849 Immunodeficiency, unspecified: Secondary | ICD-10-CM | POA: Diagnosis not present

## 2020-02-04 DIAGNOSIS — Z940 Kidney transplant status: Principal | ICD-10-CM

## 2020-02-04 DIAGNOSIS — Z79899 Other long term (current) drug therapy: Principal | ICD-10-CM

## 2020-02-05 ENCOUNTER — Ambulatory Visit: Admit: 2020-02-05 | Discharge: 2020-02-06 | Payer: MEDICARE

## 2020-02-05 ENCOUNTER — Encounter: Admit: 2020-02-05 | Discharge: 2020-02-06 | Payer: MEDICARE | Attending: Nephrology | Primary: Nephrology

## 2020-02-05 ENCOUNTER — Encounter: Admit: 2020-02-05 | Discharge: 2020-02-06 | Payer: MEDICARE

## 2020-02-05 DIAGNOSIS — Z940 Kidney transplant status: Principal | ICD-10-CM

## 2020-02-05 DIAGNOSIS — Z79899 Other long term (current) drug therapy: Principal | ICD-10-CM

## 2020-02-05 DIAGNOSIS — N1832 Chronic kidney disease, stage 3b: Secondary | ICD-10-CM | POA: Diagnosis not present

## 2020-02-05 DIAGNOSIS — D849 Immunodeficiency, unspecified: Secondary | ICD-10-CM | POA: Diagnosis not present

## 2020-02-05 DIAGNOSIS — N281 Cyst of kidney, acquired: Secondary | ICD-10-CM | POA: Diagnosis not present

## 2020-02-05 DIAGNOSIS — I10 Essential (primary) hypertension: Secondary | ICD-10-CM | POA: Diagnosis not present

## 2020-02-05 DIAGNOSIS — R911 Solitary pulmonary nodule: Secondary | ICD-10-CM | POA: Diagnosis not present

## 2020-02-05 DIAGNOSIS — N2581 Secondary hyperparathyroidism of renal origin: Secondary | ICD-10-CM | POA: Diagnosis not present

## 2020-02-05 DIAGNOSIS — I712 Thoracic aortic aneurysm, without rupture: Secondary | ICD-10-CM | POA: Diagnosis not present

## 2020-02-05 DIAGNOSIS — I77810 Thoracic aortic ectasia: Secondary | ICD-10-CM | POA: Diagnosis not present

## 2020-02-05 DIAGNOSIS — K8020 Calculus of gallbladder without cholecystitis without obstruction: Secondary | ICD-10-CM | POA: Diagnosis not present

## 2020-02-05 LAB — LACTATE DEHYDROGENASE: Lactate dehydrogenase:CCnc:Pt:Ser/Plas:Qn:: 142

## 2020-02-05 LAB — URINALYSIS
BILIRUBIN UA: NEGATIVE
BLOOD UA: NEGATIVE
GLUCOSE UA: NEGATIVE
KETONES UA: NEGATIVE
LEUKOCYTE ESTERASE UA: NEGATIVE
NITRITE UA: NEGATIVE
PH UA: 7 (ref 5.0–9.0)
PROTEIN UA: 30 — AB
SPECIFIC GRAVITY UA: 1.015 (ref 1.005–1.030)
SQUAMOUS EPITHELIAL: 1 /HPF (ref 0–5)
UROBILINOGEN UA: 0.2
WBC UA: <1 /HPF (ref <2)

## 2020-02-05 LAB — CHOLESTEROL/HDL RATIO SCREEN: Lab: 2.8

## 2020-02-05 LAB — COMPREHENSIVE METABOLIC PANEL
ALBUMIN: 3.5 g/dL (ref 3.4–5.0)
ALT (SGPT): 15 U/L (ref 10–49)
ANION GAP: 8 mmol/L (ref 5–14)
AST (SGOT): 13 U/L (ref <=34)
BILIRUBIN TOTAL: 0.3 mg/dL (ref 0.3–1.2)
BLOOD UREA NITROGEN: 18 mg/dL (ref 9–23)
BUN / CREAT RATIO: 10
CALCIUM: 9.7 mg/dL (ref 8.7–10.4)
CHLORIDE: 107 mmol/L (ref 98–107)
CREATININE: 1.83 mg/dL — ABNORMAL HIGH
EGFR CKD-EPI AA MALE: 50 mL/min/{1.73_m2} — ABNORMAL LOW (ref >=60)
EGFR CKD-EPI NON-AA MALE: 44 mL/min/{1.73_m2} — ABNORMAL LOW (ref >=60)
GLUCOSE RANDOM: 91 mg/dL (ref 70–179)
POTASSIUM: 3.8 mmol/L (ref 3.4–4.5)
PROTEIN TOTAL: 6.9 g/dL (ref 5.7–8.2)
SODIUM: 141 mmol/L (ref 135–145)

## 2020-02-05 LAB — CBC W/ AUTO DIFF
BASOPHILS ABSOLUTE COUNT: 0.1 10*9/L (ref 0.0–0.1)
BASOPHILS RELATIVE PERCENT: 0.5 %
EOSINOPHILS ABSOLUTE COUNT: 0.4 10*9/L (ref 0.0–0.7)
EOSINOPHILS RELATIVE PERCENT: 3.4 %
HEMATOCRIT: 34.4 % — ABNORMAL LOW (ref 38.0–50.0)
HEMOGLOBIN: 11.5 g/dL — ABNORMAL LOW (ref 13.5–17.5)
LYMPHOCYTES ABSOLUTE COUNT: 3 10*9/L (ref 0.7–4.0)
LYMPHOCYTES RELATIVE PERCENT: 24.2 %
MEAN CORPUSCULAR HEMOGLOBIN CONC: 33.5 g/dL (ref 30.0–36.0)
MEAN CORPUSCULAR HEMOGLOBIN: 28.7 pg (ref 26.0–34.0)
MEAN CORPUSCULAR VOLUME: 85.6 fL (ref 81.0–95.0)
MONOCYTES ABSOLUTE COUNT: 0.8 10*9/L (ref 0.1–1.0)
NEUTROPHILS ABSOLUTE COUNT: 7.9 10*9/L — ABNORMAL HIGH (ref 1.7–7.7)
NEUTROPHILS RELATIVE PERCENT: 65.1 %
NUCLEATED RED BLOOD CELLS: 0 /100{WBCs} (ref <=4)
PLATELET COUNT: 219 10*9/L (ref 150–450)
RED CELL DISTRIBUTION WIDTH: 15.3 % — ABNORMAL HIGH (ref 12.0–15.0)
WBC ADJUSTED: 12.2 10*9/L — ABNORMAL HIGH (ref 3.5–10.5)

## 2020-02-05 LAB — PARATHYROID HORMONE INTACT: Parathyrin.intact:MCnc:Pt:Ser/Plas:Qn:: 323.5 — ABNORMAL HIGH

## 2020-02-05 LAB — LIPID PANEL
CHOLESTEROL/HDL RATIO SCREEN: 2.8 (ref 1.0–4.5)
CHOLESTEROL: 120 mg/dL (ref <=200)
HDL CHOLESTEROL: 43 mg/dL (ref 40–60)
LDL CHOLESTEROL CALCULATED: 50 mg/dL (ref 40–99)
VLDL CHOLESTEROL CAL: 27 mg/dL (ref 11–50)

## 2020-02-05 LAB — PHOSPHORUS: Phosphate:MCnc:Pt:Ser/Plas:Qn:: 3.8

## 2020-02-05 LAB — COLOR

## 2020-02-05 LAB — CO2: Carbon dioxide:SCnc:Pt:Ser/Plas:Qn:: 26

## 2020-02-05 LAB — PROTEIN / CREATININE RATIO, URINE
CREATININE, URINE: 86 mg/dL
PROTEIN/CREAT RATIO, URINE: 0.462

## 2020-02-05 LAB — MEAN CORPUSCULAR HEMOGLOBIN: Erythrocyte mean corpuscular hemoglobin:EntMass:Pt:RBC:Qn:Automated count: 28.7

## 2020-02-05 LAB — GAMMA GLUTAMYL TRANSFERASE: Gamma glutamyl transferase:CCnc:Pt:Ser/Plas:Qn:: 67

## 2020-02-05 LAB — MAGNESIUM: Magnesium:MCnc:Pt:Ser/Plas:Qn:: 1.5 — ABNORMAL LOW

## 2020-02-05 LAB — PROTEIN URINE: Protein:MCnc:Pt:Urine:Qn:: 39.7

## 2020-02-05 MED ORDER — AMLODIPINE 5 MG TABLET: 5 mg | tablet | Freq: Every day | 11 refills | 30 days

## 2020-02-05 MED ORDER — AMLODIPINE 5 MG TABLET
ORAL_TABLET | Freq: Every day | ORAL | 11 refills | 30.00000 days | Status: CP
Start: 2020-02-05 — End: 2020-02-05

## 2020-02-05 NOTE — Unmapped (Signed)
AOBP:Right arm  medium cuff   Average:115/63 Pulse:101  1st reading:115/63 Pulse:102  2nd reading:117/62 Pulse:100  3rd reading:114/63 Pulse:100

## 2020-02-05 NOTE — Unmapped (Signed)
I spent 15 minutes with the patient at his clinic visit. I reviewed and updated his medications. He was admitted locally for dehydration in April 2021 and bacterial pneumonia in August 2021. His losartan was stopped during his last admission and he was started on amlodipine but with no refills. With his heart history he would benefit to go back on the losartan and Dr. Toni Arthurs will discuss that. He also has heartburn but his omeprazole was stopped during his last admission and we will discuss starting some pepcid everyday.

## 2020-02-06 LAB — TACROLIMUS, TROUGH: Lab: 5

## 2020-02-06 LAB — CMV DNA, QUANTITATIVE, PCR

## 2020-02-06 LAB — CMV QUANT LOG10: Lab: 0

## 2020-02-06 LAB — EBV QUANTITATIVE PCR, BLOOD

## 2020-02-06 LAB — EBV QUANT LOG: Epstein Barr virus DNA:LnCnc:Pt:XXX:Qn:Probe.amp.tar: 0

## 2020-02-09 LAB — VITAMIN D, TOTAL (25OH): Lab: 16.6 — ABNORMAL LOW

## 2020-02-11 ENCOUNTER — Ambulatory Visit
Admit: 2020-02-11 | Discharge: 2020-02-12 | Payer: MEDICARE | Attending: Physician Assistant | Primary: Physician Assistant

## 2020-02-11 DIAGNOSIS — I712 Thoracic aortic aneurysm, without rupture: Principal | ICD-10-CM

## 2020-02-11 LAB — VITAMIN D 1,25 DIHYDROXY: VITAMIN D 1,25-DIHYDROXY: 40 pg/mL

## 2020-02-11 MED ORDER — CHOLECALCIFEROL (VITAMIN D3) 50 MCG (2,000 UNIT) TABLET
ORAL_TABLET | Freq: Every day | ORAL | 3 refills | 90.00000 days | Status: CP
Start: 2020-02-11 — End: ?

## 2020-02-11 NOTE — Unmapped (Signed)
Recall in epic for 2023.

## 2020-02-11 NOTE — Unmapped (Signed)
Labs reviewed with Dr. Toni Arthurs, ordered 2000 units every day, new RX sent and patient's mom is aware.

## 2020-02-11 NOTE — Unmapped (Signed)
HPI:  Oscar [name] is a 44 y.o. who presents to clinic for his 1 year follow-up for ascending aortic aneurysm.  The patient has a history of s/p kidney transplant 02/22/2000 due to tuberous sclerosis (Tacrolimus), epilepsy, and HTN, who is being seen for aortic aneurysm, which was accidentally found on ECHO for heart murmur. In addition, it revealed moderate to severe Left ventricular hypertrophy and non valvular desease. The non contrast CT measured aorta about 3.5 cm.   Today, patient denies any chest pain, SOB, dizziness, fatigue, syncope episodes, PND. Patient has strong history of aneurysm in his family.     Medications:  Current Outpatient Medications   Medication Sig Dispense Refill   ??? amLODIPine (NORVASC) 5 MG tablet Take 1 tablet (5 mg total) by mouth every evening. 30 tablet 11   ??? cholecalciferol, vitamin D3-50 mcg, 2,000 unit,, (VITAMIN D3-50 MCG, 2,000 UNIT,) 50 mcg (2,000 unit) tablet Take 1 tablet (50 mcg total) by mouth daily. 2000 units QD 90 tablet 3   ??? divalproex (DEPAKOTE) 250 MG DR tablet Take 2 tablets (500 mg total) by mouth daily at 0600. This prescription is written specifically for brand name depakote. 180 tablet 3   ??? gabapentin (NEURONTIN) 300 MG capsule Take 1 capsule (300 mg total) by mouth nightly. 90 capsule 3   ??? imipramine (TOFRANIL) 25 MG tablet TAKE 2 TABLETS BY MOUTH NIGHTLY 180 tablet 3   ??? predniSONE (DELTASONE) 5 MG tablet Take 1 tablet (5 mg total) by mouth daily. 90 tablet 3   ??? PROGRAF 1 mg capsule Take 2 capsules (2 mg total) by mouth two (2) times a day. 360 capsule 3     No current facility-administered medications for this visit.       Vitals:    02/11/20 1422   BP: 116/78   Pulse: 101   SpO2: 97%     General: alert and oriented, resting comfortably in NAD  HEENT: normocephalic, atraumatic. sclera anicteric, MMM  Neck: Supple.  Pulmonary: non-labored breathing, lungs CTAB, No wheezes, rales or rhonchi.   CV: regular rate and rhythm. Normal S1, S2. No murmurs, rubs or gallops. 2+ DP and radial pulses bilaterally.   Abdomen/GI: soft, non-tender, non-distended. positive bowel sounds throughout abdomen. no rebound or guarding present.  Neurologic: A&O x 3, answering questions appropriately. strength equal in upper & lower extremities bilaterally. sensation intact throughout. no facial droop noted.  Extremities: warm and well perfused.   Skin: warm and dry. No rashes    Diagnostic Studies:    CT chest 02/05/20:  - Ectatic thoracic aorta measuring 3.6 x 3.5 cm, unchanged from prior.    Assessment/Plan:   Oscar [name] is a very pleasant 44 y.o. male with a history of ascending aortic aneurysm who presents to clinic for follow-up visit.  The patient is doing well without symptoms.  The patients aneurysm remains stable and we will Follow-up in our clinic in two years with a repeat CT.  Encouraged to call with any ongoing questions or concerns at any time.

## 2020-02-12 NOTE — Unmapped (Signed)
[facility] transplant nephrology clinic visit    assessment and plan  1. s/p kidney transplant 02/22/2000. baseline creatinine 2 mg/dl. mild proteinuria < 0.5 gm. no donor specific hla ab detected.  2. immunosuppression. prednisone 5mg  daily. tacrolimus 12hr lvl 5-7 ng/ml.  3. hypertension. repeat trial of angiotensin receptor blockade or beta blocker d/t left ventricular hypertrophy with diastolic dysfunction recommended. Oscar [name] is not interested in change in medication at present. blood pressure goal < 130/80 mmhg.  4. ascending aortic aneurysm 3.5cm. +stable appearance on chest ct '21. [facility] follow up appt 09/29 as scheduled.  5. secondary hyperparathyroidism in stage iiib chronic kidney disease. +vitamin d 2,000u daily.  6. gastroesophageal relfux. +famotidine 20mg  prn.  7. preventive medicine. influenza '21. pcv13 pneumococcal '17. ppsv23 pneumococcal '18. covid-19 pfizer vaccine '21. kidney ultrasound '21. dermatology appt recommended. screening colonoscopy recommended.    history of present illness    [name] is a 44 year old gentleman seen in follow up s/p kidney transplant 02/22/2000. +hospital admission 4/23-27 with n/v, prerenal aki which resolved with volume repletion; losartan held with transition back to amlodipine. +hospital admission 8/21-24 with community acquired pneumonia. Oscar [name] feels back to baseline health. no fevers chills or sweats. no headache or lightheaded. no chest pain palpitations cough or shortness of breath. no lower extremity edema. appetite nl. +mild heartburn. no n/v/d. no dysuria hematuria or difficulty voiding. all other systems reviewed and negative x10 systems.    past medical history:  1. s/p living donor kidney transplant 02/22/2000. tuberous sclerosis. baseline creatinine 2 mg/dl.  2. hypertension  3. echocardiogram 07/20: +mod-severe lvh. lvef > 70%. +diastolic dysfunction. +ascending aorta dilatation.  4. seizure disorder hx  5. migraine  6. 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  ??  past surgical history: bilateral native nephrectomy d/t renal cell carcinoma '99. left arm av fistula '99. kidney transplant '01. 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 left neck cyst excision '18. left neck and bilateral facial cyst excision '19.  ??  allergies: sulfa. erythromycin  ??  medications: tacrolimus 2mg  bid, prednisone 5mg  daily, amlodipine 5mg  daily, divalproex 500mg  daily, gabapentin 300mg  q.pm, imipramine 50mg  q.pm.    physical exam: t98.3 p101 bp115/63 wt64.6kg bmi 19.3. wd/wn gentleman appropriate affect and mood. sclera anicteric. wearing mask. neck supple no palpable ln. heart rrr nl s1s2 +iii/vi syst murmur no s3/s4. lungs clear bilateral. abd soft nt/nd. no lower ext edema. msk no synovitis or tophi. skin no rash. neuro alert oriented non focal exam.    labs 02/05/20: wbc12.2 hgb11.5 hct34.4 plts219. na141 k3.8 cl107 bicarb26 bun18 cr1.8 glc91 ca9.7 mg1.5 phos3.8 albumin3.5 liver function panel nl. total cholesterol 120 tg 135 hdl 43 ldl 50. pth 324. 25-vitamin d 16.6. cmv pcr not detected. tacrolimus 12hr lvl 5.0 ng/ml. urine protein/cr 0.462.

## 2020-02-13 LAB — HLA DS POST TRANSPLANT
ANTI-DONOR HLA-A #2 MFI: 0 MFI
ANTI-DONOR HLA-B #1 MFI: 19 MFI
ANTI-DONOR HLA-C #1 MFI: 0 MFI
ANTI-DONOR HLA-DR #1 MFI: 0 MFI

## 2020-02-13 LAB — HLA CL2 AB COMMENT: Lab: 0

## 2020-02-13 LAB — FSAB CLASS 1 ANTIBODY SPECIFICITY

## 2020-02-13 LAB — ANTI-DONOR DRW #1 MFI: Lab: 48

## 2020-02-13 LAB — HLA CL1 ANTIBODY COMM: Lab: 0

## 2020-02-13 LAB — FSAB CLASS 2 ANTIBODY SPECIFICITY: HLA CL2 AB RESULT: NEGATIVE

## 2020-03-04 ENCOUNTER — Other Ambulatory Visit: Payer: Self-pay | Admitting: Family Medicine

## 2020-03-04 DIAGNOSIS — Q851 Tuberous sclerosis: Secondary | ICD-10-CM

## 2020-03-06 ENCOUNTER — Other Ambulatory Visit: Payer: Self-pay | Admitting: Family Medicine

## 2020-03-12 NOTE — Unmapped (Signed)
[name] Refill Coordination Note    Specialty Medication(s) to be Shipped:   Transplant: Prograf 1mg  and Prednisone 5mg     Other medication(s) to be shipped: No additional medications requested for fill at this time     Oscar [name], DOB: 1975-08-19  Phone: [phone] (home)       All above HIPAA information was verified with patient's family member, MOM.     Was a Nurse, learning disability used for this call? No    Completed refill call assessment today to schedule patient's medication shipment from the [facility] [phone]).       Specialty medication(s) and dose(s) confirmed: Regimen is correct and unchanged.   Changes to medications: [name] reports no changes at this time.  Changes to insurance: No  Questions for the pharmacist: No    Confirmed patient received Welcome Packet with first shipment. The patient will receive a drug information handout for each medication shipped and additional FDA Medication Guides as required.       DISEASE/MEDICATION-SPECIFIC INFORMATION        N/A    SPECIALTY MEDICATION ADHERENCE     Medication Adherence    Patient reported X missed doses in the last month: 0  Specialty Medication: PREDNISONE 5MG   Patient is on additional specialty medications: Yes  Additional Specialty Medications: PROGRAF 1MG   Patient Reported Additional Medication X Missed Doses in the Last Month: 0  Patient is on more than two specialty medications: No  Any gaps in refill history greater than 2 weeks in the last 3 months: no  Demonstrates understanding of importance of adherence: yes  Informant: mother  Reliability of informant: reliable  Provider-estimated medication adherence level: good  Patient is at risk for Non-Adherence: No  Adherence tools used: patient uses a pill box to manage medications  Support network for adherence: family member                PROGRAF 1 mg: 14 days of medicine on hand   PREDNISONE 5 mg: 14 days of medicine on hand         SHIPPING Shipping address confirmed in Epic.     Delivery Scheduled: Yes, Expected medication delivery date: 11/04.     Medication will be delivered via UPS to the prescription address in Epic W[facility].    Antonietta Barcelona   [facility] Specialty Technician

## 2020-03-17 MED FILL — PROGRAF 1 MG CAPSULE: ORAL | 90 days supply | Qty: 360 | Fill #3

## 2020-03-17 MED FILL — PROGRAF 1 MG CAPSULE: 90 days supply | Qty: 360 | Fill #3 | Status: AC

## 2020-03-17 MED FILL — PREDNISONE 5 MG TABLET: ORAL | 90 days supply | Qty: 90 | Fill #3

## 2020-03-17 MED FILL — PREDNISONE 5 MG TABLET: 90 days supply | Qty: 90 | Fill #3 | Status: AC

## 2020-03-25 DIAGNOSIS — Z940 Kidney transplant status: Secondary | ICD-10-CM | POA: Diagnosis not present

## 2020-04-27 DIAGNOSIS — I10 Essential (primary) hypertension: Secondary | ICD-10-CM | POA: Diagnosis not present

## 2020-04-27 DIAGNOSIS — N2581 Secondary hyperparathyroidism of renal origin: Secondary | ICD-10-CM | POA: Diagnosis not present

## 2020-04-27 DIAGNOSIS — D849 Immunodeficiency, unspecified: Secondary | ICD-10-CM | POA: Diagnosis not present

## 2020-04-27 DIAGNOSIS — Z940 Kidney transplant status: Secondary | ICD-10-CM | POA: Diagnosis not present

## 2020-04-27 DIAGNOSIS — E559 Vitamin D deficiency, unspecified: Secondary | ICD-10-CM | POA: Diagnosis not present

## 2020-05-05 ENCOUNTER — Other Ambulatory Visit: Payer: Self-pay | Admitting: Family Medicine

## 2020-05-10 NOTE — Unmapped (Signed)
[facility] office note 04/27/20 sent to HIM Suan Halter   May 10, 2020 8:32 AM

## 2020-05-11 DIAGNOSIS — L03115 Cellulitis of right lower limb: Secondary | ICD-10-CM | POA: Diagnosis not present

## 2020-06-04 DIAGNOSIS — Z940 Kidney transplant status: Principal | ICD-10-CM

## 2020-06-04 MED ORDER — PROGRAF 1 MG CAPSULE
ORAL_CAPSULE | Freq: Two times a day (BID) | ORAL | 3 refills | 90 days
Start: 2020-06-04 — End: 2021-06-04

## 2020-06-04 MED ORDER — PREDNISONE 5 MG TABLET
ORAL_TABLET | Freq: Every day | ORAL | 3 refills | 90 days
Start: 2020-06-04 — End: 2021-06-04

## 2020-06-08 MED ORDER — PROGRAF 1 MG CAPSULE
ORAL_CAPSULE | Freq: Two times a day (BID) | ORAL | 3 refills | 90 days | Status: CP
Start: 2020-06-08 — End: 2021-06-08
  Filled 2020-06-21: qty 360, 90d supply, fill #0

## 2020-06-08 MED ORDER — PREDNISONE 5 MG TABLET
ORAL_TABLET | Freq: Every day | ORAL | 3 refills | 90.00000 days | Status: CP
Start: 2020-06-08 — End: 2021-06-08
  Filled 2020-06-21: qty 90, 90d supply, fill #0

## 2020-06-09 ENCOUNTER — Ambulatory Visit: Payer: Medicare Other | Admitting: Family Medicine

## 2020-06-14 NOTE — Unmapped (Signed)
[facility] Clinical Assessment & Refill Coordination Note    Oscar [name], DOB: [birth year]  Phone: [phone] (home)     All above HIPAA information was verified with patient's family member, spoke with Mr. Levene mom today..     Was a translator used for this call? No    Specialty Medication(s):   Transplant: Prograf 1mg  and Prednisone 5mg      Current Outpatient Medications   Medication Sig Dispense Refill   ??? amLODIPine (NORVASC) 5 MG tablet Take 1 tablet (5 mg total) by mouth every evening. 30 tablet 11   ??? cholecalciferol, vitamin D3-50 mcg, 2,000 unit,, (VITAMIN D3-50 MCG, 2,000 UNIT,) 50 mcg (2,000 unit) tablet Take 1 tablet (50 mcg total) by mouth daily. 2000 units QD 90 tablet 3   ??? divalproex (DEPAKOTE) 250 MG DR tablet Take 2 tablets (500 mg total) by mouth daily at 0600. This prescription is written specifically for brand name depakote. 180 tablet 3   ??? gabapentin (NEURONTIN) 300 MG capsule Take 1 capsule (300 mg total) by mouth nightly. 90 capsule 3   ??? imipramine (TOFRANIL) 25 MG tablet TAKE 2 TABLETS BY MOUTH NIGHTLY 180 tablet 3   ??? predniSONE (DELTASONE) 5 MG tablet Take 1 tablet (5 mg total) by mouth daily. 90 tablet 3   ??? PROGRAF 1 mg capsule Take 2 capsules (2 mg total) by mouth two (2) times a day. 360 capsule 3     No current facility-administered medications for this visit.        Changes to medications: Paiton reports no changes at this time.    Allergies   Allergen Reactions   ??? Erythromycin    ??? Erythromycin Base      Other reaction(s): NAUSEA   ??? Sulfa (Sulfonamide Antibiotics)      Other reaction(s): ITCHING   ??? Sulfacetamide Sodium        Changes to allergies: No    SPECIALTY MEDICATION ADHERENCE     Prograf 1 mg: 13 days of medicine on hand   Prednisone 5 mg: 13 days of medicine on hand       Medication Adherence    Patient reported X missed doses in the last month: 0  Specialty Medication: Prednisone 5mg   Patient is on additional specialty medications: Yes  Additional Specialty Medications: Prograf 1mg   Patient Reported Additional Medication X Missed Doses in the Last Month: 0  Patient is on more than two specialty medications: No  Adherence tools used: patient uses a pill box to manage medications  Support network for adherence: family member          Specialty medication(s) dose(s) confirmed: Regimen is correct and unchanged.     Are there any concerns with adherence? No    Adherence counseling provided? Not needed    CLINICAL MANAGEMENT AND INTERVENTION      Clinical Benefit Assessment:    Do you feel the medicine is effective or helping your condition? Yes    Clinical Benefit counseling provided? Not needed    Adverse Effects Assessment:    Are you experiencing any side effects? No    Are you experiencing difficulty administering your medicine? No    Quality of Life Assessment:    How many days over the past month did your kidney transplant  keep you from your normal activities? For example, brushing your teeth or getting up in the morning. 0    Have you discussed this with your provider? Not needed  Therapy Appropriateness:    Is therapy appropriate? Yes, therapy is appropriate and should be continued    DISEASE/MEDICATION-SPECIFIC INFORMATION      N/A    PATIENT SPECIFIC NEEDS     - Does the patient have any physical, cognitive, or cultural barriers? No    - Is the patient high risk? No    - Does the patient require a Care Management Plan? No     - Does the patient require physician intervention or other additional services (i.e. nutrition, smoking cessation, social work)? No      SHIPPING     Specialty Medication(s) to be Shipped:   Transplant: Prograf 1mg  and Prednisone 5mg     Other medication(s) to be shipped: No additional medications requested for fill at this time     Changes to insurance: No    Delivery Scheduled: Yes, Expected medication delivery date: 06/22/20.     Medication will be delivered via UPS to the confirmed prescription address in [facility].    The patient will receive a drug information handout for each medication shipped and additional FDA Medication Guides as required.  Verified that patient has previously received a Conservation officer, historic buildings.    All of the patient's questions and concerns have been addressed.    Tera Helper   [facility] Specialty Pharmacist

## 2020-06-15 NOTE — Progress Notes (Signed)
    SUBJECTIVE:   CHIEF COMPLAINT / HPI:   Foot pain / Swelling:  2 months ago (December 2021) patient hit his right foot against leg of bed with lateral/outer side of his right foot. Originally there was a "knot" on outer aspect of R foot but he put some heat on it and the knot went away.  However, his foot still wasn't better 2 weeks later, in fact he thinks it was maybe a little bit worse, so he went to urgent care.  At urgent care he had an x-ray of the foot done which was negative for fracture. He received an Antibiotic for concerns for inflammation.  Patient reports that he feels that he still cannot bear weight on the foot.  When he bears weight on the foot he feels lots of pain in the top of his foot.  He describes the pain as heavy double to light sharp sensation.  He also describes a sensation of warmth coming from the foot at night, pain level fluctuates depending on the day, but feels that it is worse later in the day.  The patient takes Tylenol extra strength to control pain in the foot, however otherwise has not taken any meds.  Patient has to be very selective about pain meds he can take due to history of kidney transplant.  Also, due to history of kidney transplant patient is at higher risk of fragile bones/fractures.   PERTINENT  PMH / PSH:  Hearing loss of Left ear; Tuberous sclerosis, s/p renal transplant (2014), GERD  OBJECTIVE:   BP 120/60   Pulse 91   Ht 6' (1.829 m)   SpO2 96%   BMI 18.21 kg/m    Physical exam: General: Well-appearing, nontoxic-appearing Respiratory: Comfortable work of breathing, speaking complete sentences Right Foot: Inspection:  No obvious bony deformity.  Mild swelling and warmth appreciated to lateral dorsal aspect of foot, no erythema or bruising, normal arch.  Palpation: Moderate tenderness to palpation appreciated over third, fourth, and fifth proximal metatarsals, as well as over lateral malleolus ROM: Full ROM of the ankle however  patient has lots of pain with this motion. Normal midfoot flexibility Strength: 4/5 strength ankle in all planes (possibly reduced effort secondary to pain) Neurovascular: N/V intact distally in the lower extremity  ASSESSMENT/PLAN:   Right foot pain Concern for ligamentous injury of midfoot involving cuboid, lateral cuneiform, proximal fourth/fifth metatarsal. -Rest, ice, compression, elevation of foot -Patient sent for repeat x-rays of foot (unable to see x-rays taken at previous clinic) -Patient referred to sports [place], [place]

## 2020-06-16 ENCOUNTER — Encounter: Payer: Self-pay | Admitting: Family Medicine

## 2020-06-16 ENCOUNTER — Other Ambulatory Visit: Payer: Self-pay

## 2020-06-16 ENCOUNTER — Ambulatory Visit (INDEPENDENT_AMBULATORY_CARE_PROVIDER_SITE_OTHER): Payer: Medicare Other | Admitting: Family Medicine

## 2020-06-16 VITALS — BP 120/60 | HR 91 | Ht 72.0 in

## 2020-06-16 DIAGNOSIS — M79671 Pain in right foot: Secondary | ICD-10-CM | POA: Diagnosis not present

## 2020-06-16 MED ORDER — GABAPENTIN 300 MG PO CAPS: 300.0000 mg | ORAL_CAPSULE | Freq: Every day | ORAL | 0 refills | Status: DC

## 2020-06-16 NOTE — Assessment & Plan Note (Signed)
Concern for ligamentous injury of midfoot involving cuboid, lateral cuneiform, proximal fourth/fifth metatarsal. -Rest, ice, compression, elevation of foot -Patient sent for repeat x-rays of foot (unable to see x-rays taken at previous clinic) -Patient referred to sports medicine

## 2020-06-16 NOTE — Patient Instructions (Signed)
Thank you for coming in to see [place] today! Please see below to review our plan for today's visit:  1. I am referring you to Sports Medicine for further evaluation of your foot pain. Go to [facility] to [facility] to get your foot and ankle xray.    Please call the clinic at [phone] if your symptoms worsen or you have any concerns. It was our pleasure to serve you!   Dr. Milus Banister [facility]

## 2020-06-24 ENCOUNTER — Ambulatory Visit (INDEPENDENT_AMBULATORY_CARE_PROVIDER_SITE_OTHER): Payer: Medicare Other | Admitting: Family Medicine

## 2020-06-24 ENCOUNTER — Other Ambulatory Visit: Payer: Self-pay

## 2020-06-24 VITALS — BP 122/86 | Ht 72.0 in

## 2020-06-24 DIAGNOSIS — M79671 Pain in right foot: Secondary | ICD-10-CM

## 2020-06-24 NOTE — Progress Notes (Addendum)
Office Visit Note   Patient: Tyler Alvarez           Date of Birth: 1975/07/15           MRN: [id number] Visit Date: 06/24/2020 Requested by: Martyn Malay, MD [place],  [place] [address] PCP: Daisy Floro, DO  Subjective: CC: Right Foot Pain  HPI: 45 year old male with past medical history significant for renal transplantation presenting to clinic with concerns of several years of severe right foot pain.  Patient states his pain initially started when a heavy lamp fell on his right midfoot several years ago, and has never resolved.  2 to 3 months ago patient struck his foot against a bedpost, with subsequent bruising throughout his lateral and dorsal midfoot accompanied by the inability to bear weight.  He initially presented to an urgent care center, where x-rays were negative.  Since this time, patient states he has been entirely unable to tolerate putting weight on his foot.  He borrowed a walking boot from a friend, and says that he feels a little bit more comfortable with this on.  He has also been using his elderly mother's walker, to help take the pressure off of his foot.  He says that his nephrologist was concerned that he may have gout following his kidney transplant, but he does not currently have any medications for this.  He is unable to tolerate NSAIDs due to his renal status.  He has tried Tylenol as well as Voltaren gel, which offered no improvement to his symptoms.  At the urgent care, his foot was significantly red and swollen and he was given a course of antibiotics.  Though the redness and the swelling has gone down, his pain has not improved with it.  No numbness or tingling in his foot.              ROS: No fevers or chills.  All other systems were reviewed and are negative.  Objective: Vital Signs: BP 122/86   Ht 6' (1.829 m)   BMI 18.21 kg/m   Physical Exam:  General:  Alert and oriented, in no acute distress. Pulm:  Breathing  unlabored. Psy:  Normal mood, congruent affect. Skin: Right foot with no bruising, rashes, or erythema.  Overlying skin intact.  Does have dry skin throughout right foot.  No significant cracking.  Right foot exam General assessment: Ambulates with walker assistance, significantly antalgic gait with shortened stance phase on right. Inspection: Mild pes planus bilaterally, however this is difficult to fully assess as patient refuses to put weight on the right foot. No significant swelling or deformity.  Seated Exam: Ankle motion: Full range of motion in ankle, however he endorses pain throughout.  Significant pain with any flexion or extension of his toes. Palpation: Endorses significant tenderness palpation along the first MTP joint as well as some less severe tenderness along the subsequent MTP joints.  Tenderness throughout the mid foot.  Tenderness along the base of the fifth metatarsal. Ligamentous Examination:  No specific tenderness over medial or lateral ligamentous complexes.  \  Strength: Strength examination limited due to pain. Normal distal sensation, with brisk capillary refill.   Imaging: Limited extremity ultrasound: Right foot- Right first MTP with no significant spurring or joint effusion.  No swelling along flexor tendon or extensor tendons. Midfoot does demonstrate some intertarsal spurring, most significant around navicular and medial cuneiform.  No effusion in this area. Base of fifth metatarsal intact, with no  significant effusion or cortical irregularities.  Impression: Concern for possible midfoot arthritis.  Assessment & Plan: 45 year old male with complex medical history including kidney transplantation, presenting to clinic with concerns of diffuse right foot pain which has been ongoing for several years and worsened over the past few months.  X-ray obtained in the urgent care was reportedly negative, and ultrasound examination today without obvious explanation  for his pain, though does seem to have some possible degenerative changes. -Given patient's significant pain and inability to bear weight for several months, will order an MRI to better evaluate for underlying source of his foot pain. -We will also order a uric acid level to evaluate for possible underlying gout.  Does not have any acute erythema or swelling, though despite this his uric acid level may be artificially lowered due to current pain, however may still have value due to the chronicity of his pain.  -He may continue use of walking boot as this improves his symptoms. -Follow-up pending MRI results. -Patient and his mother expressed understanding with plan.  They have no further questions or concerns.   I was the preceptor for this visit and available for immediate consultation Shellia Cleverly, DO

## 2020-06-25 LAB — URIC ACID: Uric Acid: 10.1 mg/dL — ABNORMAL HIGH (ref 3.8–8.4)

## 2020-07-13 ENCOUNTER — Ambulatory Visit
Admission: RE | Admit: 2020-07-13 | Discharge: 2020-07-13 | Disposition: A | Discharge status: Home / Self Care | Payer: Medicare Other | Source: Ambulatory Visit | Attending: Sports Medicine | Admitting: Sports Medicine

## 2020-07-13 ENCOUNTER — Other Ambulatory Visit: Payer: Self-pay

## 2020-07-13 DIAGNOSIS — M79674 Pain in right toe(s): Secondary | ICD-10-CM | POA: Diagnosis not present

## 2020-07-13 DIAGNOSIS — M79671 Pain in right foot: Secondary | ICD-10-CM

## 2020-07-13 DIAGNOSIS — Z8739 Personal history of other diseases of the musculoskeletal system and connective tissue: Secondary | ICD-10-CM | POA: Diagnosis not present

## 2020-07-13 DIAGNOSIS — R600 Localized edema: Secondary | ICD-10-CM | POA: Diagnosis not present

## 2020-07-15 NOTE — Progress Notes (Signed)
Do you know if he was able to get into his Nephrologist's to talk about gout meds? I don't see a note about it.

## 2020-07-21 DIAGNOSIS — Z940 Kidney transplant status: Secondary | ICD-10-CM | POA: Diagnosis not present

## 2020-07-26 DIAGNOSIS — Z940 Kidney transplant status: Secondary | ICD-10-CM | POA: Diagnosis not present

## 2020-07-26 DIAGNOSIS — M109 Gout, unspecified: Secondary | ICD-10-CM | POA: Diagnosis not present

## 2020-07-26 DIAGNOSIS — I5189 Other ill-defined heart diseases: Secondary | ICD-10-CM | POA: Diagnosis not present

## 2020-07-26 DIAGNOSIS — E559 Vitamin D deficiency, unspecified: Secondary | ICD-10-CM | POA: Diagnosis not present

## 2020-07-26 DIAGNOSIS — N2581 Secondary hyperparathyroidism of renal origin: Secondary | ICD-10-CM | POA: Diagnosis not present

## 2020-07-26 DIAGNOSIS — I712 Thoracic aortic aneurysm, without rupture: Secondary | ICD-10-CM | POA: Diagnosis not present

## 2020-07-26 DIAGNOSIS — M79671 Pain in right foot: Secondary | ICD-10-CM | POA: Diagnosis not present

## 2020-07-26 DIAGNOSIS — I517 Cardiomegaly: Secondary | ICD-10-CM | POA: Diagnosis not present

## 2020-07-26 DIAGNOSIS — I10 Essential (primary) hypertension: Secondary | ICD-10-CM | POA: Diagnosis not present

## 2020-07-26 DIAGNOSIS — D849 Immunodeficiency, unspecified: Secondary | ICD-10-CM | POA: Diagnosis not present

## 2020-08-30 ENCOUNTER — Other Ambulatory Visit: Payer: Self-pay | Admitting: Family Medicine

## 2020-09-10 NOTE — Unmapped (Signed)
[facility] Refill Coordination Note    Specialty Medication(s) to be Shipped:   Transplant: Prograf 1mg  and Prednisone 5mg     Other medication(s) to be shipped: No additional medications requested for fill at this time     Oscar [name], DOB: April 19, 1976  Phone: [phone] (home)       All above HIPAA information was verified with patient's family member, mom.     Was a Nurse, learning disability used for this call? No    Completed refill call assessment today to schedule patient's medication shipment from the [facility] [phone]).  All relevant notes have been reviewed.     Specialty medication(s) and dose(s) confirmed: Regimen is correct and unchanged.   Changes to medications: Zamire reports no changes at this time.  Changes to insurance: No  New side effects reported not previously addressed with a pharmacist or physician: None reported  Questions for the pharmacist: No    Confirmed patient received a Conservation officer, historic buildings and a Surveyor, mining with first shipment. The patient will receive a drug information handout for each medication shipped and additional FDA Medication Guides as required.       DISEASE/MEDICATION-SPECIFIC INFORMATION        N/A    SPECIALTY MEDICATION ADHERENCE     Medication Adherence    Patient reported X missed doses in the last month: 0  Specialty Medication: prednisone 5mg   Patient is on additional specialty medications: Yes  Additional Specialty Medications: Prograf 1mg   Patient Reported Additional Medication X Missed Doses in the Last Month: 0  Patient is on more than two specialty medications: No  Any gaps in refill history greater than 2 weeks in the last 3 months: no  Demonstrates understanding of importance of adherence: yes  Informant: mother  Reliability of informant: reliable  Provider-estimated medication adherence level: good  Patient is at risk for Non-Adherence: No  Adherence tools used: patient uses a pill box to manage medications  Support network for adherence: family member              Were doses missed due to medication being on hold? No      prednisone 5 mg: 10 days of medicine on hand   prograf 1 mg: 10 days of medicine on hand     REFERRAL TO PHARMACIST     Referral to the pharmacist: Not needed      [facility]     Shipping address confirmed in Epic.     Delivery Scheduled: Yes, Expected medication delivery date: 05/04.     Medication will be delivered via UPS to the prescription address in Epic WAM.    Antonietta Barcelona   [facility] Specialty Technician

## 2020-09-14 MED FILL — PREDNISONE 5 MG TABLET: ORAL | 90 days supply | Qty: 90 | Fill #1

## 2020-09-15 MED FILL — PROGRAF 1 MG CAPSULE: ORAL | 90 days supply | Qty: 360 | Fill #1

## 2020-09-24 DIAGNOSIS — Z940 Kidney transplant status: Secondary | ICD-10-CM | POA: Diagnosis not present

## 2020-10-12 MED ORDER — IMIPRAMINE 25 MG TABLET
ORAL_TABLET | 3 refills | 0 days | Status: CP
Start: 2020-10-12 — End: ?

## 2020-11-09 DIAGNOSIS — M79671 Pain in right foot: Secondary | ICD-10-CM | POA: Diagnosis not present

## 2020-11-09 DIAGNOSIS — I10 Essential (primary) hypertension: Secondary | ICD-10-CM | POA: Diagnosis not present

## 2020-11-09 DIAGNOSIS — D849 Immunodeficiency, unspecified: Secondary | ICD-10-CM | POA: Diagnosis not present

## 2020-11-09 DIAGNOSIS — I517 Cardiomegaly: Secondary | ICD-10-CM | POA: Diagnosis not present

## 2020-11-09 DIAGNOSIS — I5189 Other ill-defined heart diseases: Secondary | ICD-10-CM | POA: Diagnosis not present

## 2020-11-09 DIAGNOSIS — Z940 Kidney transplant status: Secondary | ICD-10-CM | POA: Diagnosis not present

## 2020-11-09 DIAGNOSIS — M109 Gout, unspecified: Secondary | ICD-10-CM | POA: Diagnosis not present

## 2020-11-09 DIAGNOSIS — E559 Vitamin D deficiency, unspecified: Secondary | ICD-10-CM | POA: Diagnosis not present

## 2020-11-09 DIAGNOSIS — I712 Thoracic aortic aneurysm, without rupture: Secondary | ICD-10-CM | POA: Diagnosis not present

## 2020-11-09 DIAGNOSIS — N2581 Secondary hyperparathyroidism of renal origin: Secondary | ICD-10-CM | POA: Diagnosis not present

## 2020-11-16 ENCOUNTER — Other Ambulatory Visit: Payer: Self-pay | Admitting: Family Medicine

## 2020-11-29 DIAGNOSIS — Z940 Kidney transplant status: Principal | ICD-10-CM

## 2020-12-02 NOTE — Unmapped (Signed)
Called and spoke with pt's mom. She states that she is his caregiver. Informed mother of annual appts scheduled date and time. Also rescheduled CT from 9/29 to 9/28 so that all visits could be done on the same day. Dr. Toni Arthurs did not have availability on 9/29. Appt reminder letter sent to pt's address.

## 2020-12-09 DIAGNOSIS — H43812 Vitreous degeneration, left eye: Secondary | ICD-10-CM | POA: Diagnosis not present

## 2020-12-09 DIAGNOSIS — H5213 Myopia, bilateral: Secondary | ICD-10-CM | POA: Diagnosis not present

## 2020-12-14 NOTE — Unmapped (Signed)
[facility] Clinical Assessment & Refill Coordination Note    Oscar [name], DOB: 09-10-1975  Phone: [phone] (home)     All above HIPAA information was verified with patient's family member, Spoke with Oscar [name] mom..     Was a translator used for this call? No    Specialty Medication(s):   Transplant: Prograf 1mg  and Prednisone 5mg      Current Outpatient Medications   Medication Sig Dispense Refill   ??? amLODIPine (NORVASC) 5 MG tablet Take 1 tablet (5 mg total) by mouth every evening. 30 tablet 11   ??? cholecalciferol, vitamin D3-50 mcg, 2,000 unit,, (VITAMIN D3-50 MCG, 2,000 UNIT,) 50 mcg (2,000 unit) tablet Take 1 tablet (50 mcg total) by mouth daily. 2000 units QD 90 tablet 3   ??? divalproex (DEPAKOTE) 250 MG DR tablet Take 2 tablets (500 mg total) by mouth daily at 0600. This prescription is written specifically for brand name depakote. 180 tablet 3   ??? gabapentin (NEURONTIN) 300 MG capsule Take 1 capsule (300 mg total) by mouth nightly. 90 capsule 3   ??? imipramine (TOFRANIL) 25 MG tablet TAKE 2 TABLETS BY MOUTH NIGHTLY 180 tablet 3   ??? predniSONE (DELTASONE) 5 MG tablet Take 1 tablet (5 mg total) by mouth daily. 90 tablet 3   ??? PROGRAF 1 mg capsule Take 2 capsules (2 mg total) by mouth two (2) times a day. 360 capsule 3     No current facility-administered medications for this visit.        Changes to medications: Maher reports no changes at this time.    Allergies   Allergen Reactions   ??? Erythromycin    ??? Erythromycin Base      Other reaction(s): NAUSEA   ??? Sulfa (Sulfonamide Antibiotics)      Other reaction(s): ITCHING   ??? Sulfacetamide Sodium        Changes to allergies: No    SPECIALTY MEDICATION ADHERENCE     Prograf 1 mg: 13 days of medicine on hand   Prednisone 5 mg: 13 days of medicine on hand       Medication Adherence    Patient reported X missed doses in the last month: 0  Specialty Medication: Prednisone 5mg   Patient is on additional specialty medications: Yes  Additional Specialty Medications: Prograf 1mg   Patient Reported Additional Medication X Missed Doses in the Last Month: 0  Patient is on more than two specialty medications: No  Adherence tools used: patient uses a pill box to manage medications  Support network for adherence: family member          Specialty medication(s) dose(s) confirmed: Regimen is correct and unchanged.     Are there any concerns with adherence? No    Adherence counseling provided? Not needed    CLINICAL MANAGEMENT AND INTERVENTION      Clinical Benefit Assessment:    Do you feel the medicine is effective or helping your condition? Yes    Clinical Benefit counseling provided? Not needed    Adverse Effects Assessment:    Are you experiencing any side effects? No    Are you experiencing difficulty administering your medicine? No    Quality of Life Assessment:         How many days over the past month did your kideny transplant  keep you from your normal activities? For example, brushing your teeth or getting up in the morning. 0    Have you discussed this with  your provider? Not needed    Acute Infection Status:    Acute infections noted within Epic:  No active infections  Patient reported infection: None    Therapy Appropriateness:    Is therapy appropriate? Yes, therapy is appropriate and should be continued    DISEASE/MEDICATION-SPECIFIC INFORMATION      N/A    PATIENT SPECIFIC NEEDS     - Does the patient have any physical, cognitive, or cultural barriers? No    - Is the patient high risk? No    - Does the patient require a Care Management Plan? No     - Does the patient require physician intervention or other additional services (i.e. nutrition, smoking cessation, social work)? No      SHIPPING     Specialty Medication(s) to be Shipped:   Transplant: Prograf 1mg  and Prednisone 5mg     Other medication(s) to be shipped: No additional medications requested for fill at this time     Changes to insurance: No    Delivery Scheduled: Yes, Expected medication delivery date: 12/21/20.     Medication will be delivered via UPS to the confirmed prescription address in [facility].    The patient will receive a drug information handout for each medication shipped and additional FDA Medication Guides as required.  Verified that patient has previously received a Conservation officer, historic buildings and a Surveyor, mining.    The patient or caregiver noted above participated in the development of this care plan and knows that they can request review of or adjustments to the care plan at any time.      All of the patient's questions and concerns have been addressed.    Tera Helper   [facility] Specialty Pharmacist

## 2020-12-20 MED FILL — PROGRAF 1 MG CAPSULE: ORAL | 90 days supply | Qty: 360 | Fill #2

## 2020-12-20 MED FILL — PREDNISONE 5 MG TABLET: ORAL | 90 days supply | Qty: 90 | Fill #2

## 2021-02-03 DIAGNOSIS — I712 Thoracic aortic aneurysm, without rupture: Principal | ICD-10-CM

## 2021-02-08 DIAGNOSIS — Z79899 Other long term (current) drug therapy: Principal | ICD-10-CM

## 2021-02-08 DIAGNOSIS — Z940 Kidney transplant status: Principal | ICD-10-CM

## 2021-02-08 DIAGNOSIS — D849 Immunodeficiency, unspecified: Principal | ICD-10-CM

## 2021-02-09 ENCOUNTER — Other Ambulatory Visit: Payer: Self-pay | Admitting: Family Medicine

## 2021-02-09 MED ORDER — AMLODIPINE 5 MG TABLET
ORAL_TABLET | 3 refills | 0 days | Status: CP
Start: 2021-02-09 — End: ?

## 2021-02-09 NOTE — Unmapped (Signed)
Received an VM from pt's mother in regards to Coulson not being able to make his appts today due to him being sick and feverish. Called pt's mother back. Rescheduled pt's appts to 11/11 @ET . Appt reminder letter mailed to pt's address.

## 2021-02-18 DIAGNOSIS — Z940 Kidney transplant status: Secondary | ICD-10-CM | POA: Diagnosis not present

## 2021-02-18 DIAGNOSIS — R7302 Impaired glucose tolerance (oral): Secondary | ICD-10-CM | POA: Diagnosis not present

## 2021-02-18 DIAGNOSIS — N1832 Chronic kidney disease, stage 3b: Secondary | ICD-10-CM | POA: Diagnosis not present

## 2021-02-22 DIAGNOSIS — Z940 Kidney transplant status: Secondary | ICD-10-CM | POA: Diagnosis not present

## 2021-02-22 DIAGNOSIS — I7121 Aneurysm of the ascending aorta, without rupture: Secondary | ICD-10-CM | POA: Diagnosis not present

## 2021-02-22 DIAGNOSIS — I5189 Other ill-defined heart diseases: Secondary | ICD-10-CM | POA: Diagnosis not present

## 2021-02-22 DIAGNOSIS — E559 Vitamin D deficiency, unspecified: Secondary | ICD-10-CM | POA: Diagnosis not present

## 2021-02-22 DIAGNOSIS — M79671 Pain in right foot: Secondary | ICD-10-CM | POA: Diagnosis not present

## 2021-02-22 DIAGNOSIS — M109 Gout, unspecified: Secondary | ICD-10-CM | POA: Diagnosis not present

## 2021-02-22 DIAGNOSIS — D849 Immunodeficiency, unspecified: Secondary | ICD-10-CM | POA: Diagnosis not present

## 2021-02-22 DIAGNOSIS — I10 Essential (primary) hypertension: Secondary | ICD-10-CM | POA: Diagnosis not present

## 2021-02-22 DIAGNOSIS — N2581 Secondary hyperparathyroidism of renal origin: Secondary | ICD-10-CM | POA: Diagnosis not present

## 2021-02-22 DIAGNOSIS — I517 Cardiomegaly: Secondary | ICD-10-CM | POA: Diagnosis not present

## 2021-02-24 ENCOUNTER — Other Ambulatory Visit: Payer: Self-pay

## 2021-02-24 ENCOUNTER — Inpatient Hospital Stay (HOSPITAL_BASED_OUTPATIENT_CLINIC_OR_DEPARTMENT_OTHER)
Admission: EM | Admit: 2021-02-24 | Discharge: 2021-02-28 | DRG: 872 | Disposition: A | Discharge status: Home Health | Payer: Medicare Other | Attending: Family Medicine | Admitting: Family Medicine

## 2021-02-24 ENCOUNTER — Emergency Department (HOSPITAL_BASED_OUTPATIENT_CLINIC_OR_DEPARTMENT_OTHER): Payer: Medicare Other

## 2021-02-24 ENCOUNTER — Encounter (HOSPITAL_BASED_OUTPATIENT_CLINIC_OR_DEPARTMENT_OTHER): Payer: Self-pay

## 2021-02-24 DIAGNOSIS — Z940 Kidney transplant status: Secondary | ICD-10-CM

## 2021-02-24 DIAGNOSIS — Z681 Body mass index (BMI) 19 or less, adult: Secondary | ICD-10-CM | POA: Diagnosis not present

## 2021-02-24 DIAGNOSIS — L0390 Cellulitis, unspecified: Secondary | ICD-10-CM | POA: Diagnosis present

## 2021-02-24 DIAGNOSIS — D631 Anemia in chronic kidney disease: Secondary | ICD-10-CM | POA: Diagnosis not present

## 2021-02-24 DIAGNOSIS — F909 Attention-deficit hyperactivity disorder, unspecified type: Secondary | ICD-10-CM | POA: Diagnosis present

## 2021-02-24 DIAGNOSIS — N1832 Chronic kidney disease, stage 3b: Secondary | ICD-10-CM | POA: Diagnosis not present

## 2021-02-24 DIAGNOSIS — N3289 Other specified disorders of bladder: Secondary | ICD-10-CM | POA: Diagnosis not present

## 2021-02-24 DIAGNOSIS — Z7952 Long term (current) use of systemic steroids: Secondary | ICD-10-CM

## 2021-02-24 DIAGNOSIS — Z79899 Other long term (current) drug therapy: Secondary | ICD-10-CM

## 2021-02-24 DIAGNOSIS — I10 Essential (primary) hypertension: Secondary | ICD-10-CM | POA: Diagnosis not present

## 2021-02-24 DIAGNOSIS — Q851 Tuberous sclerosis: Secondary | ICD-10-CM

## 2021-02-24 DIAGNOSIS — I129 Hypertensive chronic kidney disease with stage 1 through stage 4 chronic kidney disease, or unspecified chronic kidney disease: Secondary | ICD-10-CM | POA: Diagnosis not present

## 2021-02-24 DIAGNOSIS — Z8249 Family history of ischemic heart disease and other diseases of the circulatory system: Secondary | ICD-10-CM

## 2021-02-24 DIAGNOSIS — Z20822 Contact with and (suspected) exposure to covid-19: Secondary | ICD-10-CM | POA: Diagnosis present

## 2021-02-24 DIAGNOSIS — I7121 Aneurysm of the ascending aorta, without rupture: Secondary | ICD-10-CM | POA: Diagnosis not present

## 2021-02-24 DIAGNOSIS — Z7982 Long term (current) use of aspirin: Secondary | ICD-10-CM

## 2021-02-24 DIAGNOSIS — A419 Sepsis, unspecified organism: Principal | ICD-10-CM | POA: Diagnosis present

## 2021-02-24 DIAGNOSIS — G40909 Epilepsy, unspecified, not intractable, without status epilepticus: Secondary | ICD-10-CM | POA: Diagnosis present

## 2021-02-24 DIAGNOSIS — N189 Chronic kidney disease, unspecified: Secondary | ICD-10-CM

## 2021-02-24 DIAGNOSIS — R54 Age-related physical debility: Secondary | ICD-10-CM | POA: Diagnosis present

## 2021-02-24 DIAGNOSIS — D84821 Immunodeficiency due to drugs: Secondary | ICD-10-CM | POA: Diagnosis not present

## 2021-02-24 DIAGNOSIS — H9192 Unspecified hearing loss, left ear: Secondary | ICD-10-CM | POA: Diagnosis present

## 2021-02-24 DIAGNOSIS — Z881 Allergy status to other antibiotic agents status: Secondary | ICD-10-CM

## 2021-02-24 DIAGNOSIS — R509 Fever, unspecified: Secondary | ICD-10-CM

## 2021-02-24 DIAGNOSIS — K219 Gastro-esophageal reflux disease without esophagitis: Secondary | ICD-10-CM | POA: Diagnosis present

## 2021-02-24 DIAGNOSIS — M109 Gout, unspecified: Secondary | ICD-10-CM | POA: Diagnosis present

## 2021-02-24 DIAGNOSIS — L03317 Cellulitis of buttock: Secondary | ICD-10-CM | POA: Diagnosis not present

## 2021-02-24 DIAGNOSIS — F419 Anxiety disorder, unspecified: Secondary | ICD-10-CM | POA: Diagnosis present

## 2021-02-24 DIAGNOSIS — L0231 Cutaneous abscess of buttock: Secondary | ICD-10-CM

## 2021-02-24 DIAGNOSIS — D72829 Elevated white blood cell count, unspecified: Secondary | ICD-10-CM | POA: Diagnosis not present

## 2021-02-24 DIAGNOSIS — G2581 Restless legs syndrome: Secondary | ICD-10-CM | POA: Diagnosis not present

## 2021-02-24 DIAGNOSIS — Z79621 Long term (current) use of calcineurin inhibitor: Secondary | ICD-10-CM

## 2021-02-24 DIAGNOSIS — I959 Hypotension, unspecified: Secondary | ICD-10-CM | POA: Diagnosis not present

## 2021-02-24 DIAGNOSIS — Z23 Encounter for immunization: Secondary | ICD-10-CM

## 2021-02-24 DIAGNOSIS — Z882 Allergy status to sulfonamides status: Secondary | ICD-10-CM

## 2021-02-24 DIAGNOSIS — Z905 Acquired absence of kidney: Secondary | ICD-10-CM

## 2021-02-24 HISTORY — DX: Cellulitis, unspecified: L03.90

## 2021-02-24 LAB — CBC WITH DIFFERENTIAL/PLATELET
Abs Immature Granulocytes: 0.08 10*3/uL — ABNORMAL HIGH (ref 0.00–0.07)
Basophils Absolute: 0.1 10*3/uL (ref 0.0–0.1)
Basophils Relative: 0 %
Eosinophils Absolute: 0.3 10*3/uL (ref 0.0–0.5)
Eosinophils Relative: 2 %
HCT: 35.2 % — ABNORMAL LOW (ref 39.0–52.0)
Hemoglobin: 12.2 g/dL — ABNORMAL LOW (ref 13.0–17.0)
Immature Granulocytes: 1 %
Lymphocytes Relative: 10 %
Lymphs Abs: 1.7 10*3/uL (ref 0.7–4.0)
MCH: 30.6 pg (ref 26.0–34.0)
MCHC: 34.7 g/dL (ref 30.0–36.0)
MCV: 88.2 fL (ref 80.0–100.0)
Monocytes Absolute: 1.1 10*3/uL — ABNORMAL HIGH (ref 0.1–1.0)
Monocytes Relative: 7 %
Neutro Abs: 13 10*3/uL — ABNORMAL HIGH (ref 1.7–7.7)
Neutrophils Relative %: 80 %
Platelets: 168 10*3/uL (ref 150–400)
RBC: 3.99 MIL/uL — ABNORMAL LOW (ref 4.22–5.81)
RDW: 12.8 % (ref 11.5–15.5)
WBC: 16.2 10*3/uL — ABNORMAL HIGH (ref 4.0–10.5)
nRBC: 0 % (ref 0.0–0.2)

## 2021-02-24 LAB — COMPREHENSIVE METABOLIC PANEL
ALT: 10 U/L (ref 0–44)
AST: 15 U/L (ref 15–41)
Albumin: 4.1 g/dL (ref 3.5–5.0)
Alkaline Phosphatase: 61 U/L (ref 38–126)
Anion gap: 11 (ref 5–15)
BUN: 32 mg/dL — ABNORMAL HIGH (ref 6–20)
CO2: 26 mmol/L (ref 22–32)
Calcium: 9.6 mg/dL (ref 8.9–10.3)
Chloride: 101 mmol/L (ref 98–111)
Creatinine, Ser: 2.26 mg/dL — ABNORMAL HIGH (ref 0.61–1.24)
GFR, Estimated: 35 mL/min — ABNORMAL LOW (ref >60)
Glucose, Bld: 106 mg/dL — ABNORMAL HIGH (ref 70–99)
Potassium: 3.9 mmol/L (ref 3.5–5.1)
Sodium: 138 mmol/L (ref 135–145)
Total Bilirubin: 0.9 mg/dL (ref 0.3–1.2)
Total Protein: 7.2 g/dL (ref 6.5–8.1)

## 2021-02-24 LAB — LACTIC ACID, PLASMA: Lactic Acid, Venous: 1.3 mmol/L (ref 0.5–1.9)

## 2021-02-24 LAB — PROTIME-INR
INR: 1 (ref 0.8–1.2)
Prothrombin Time: 12.7 seconds (ref 11.4–15.2)

## 2021-02-24 LAB — RESP PANEL BY RT-PCR (FLU A&B, COVID) ARPGX2
Influenza A by PCR: NEGATIVE
Influenza B by PCR: NEGATIVE
SARS Coronavirus 2 by RT PCR: NEGATIVE

## 2021-02-24 MED ORDER — ACETAMINOPHEN 325 MG PO TABS
650.0000 mg | ORAL_TABLET | Freq: Once | ORAL | Status: AC
  Administered 2021-02-24: 650 mg via ORAL
  Filled 2021-02-24: qty 2

## 2021-02-24 MED ORDER — SODIUM CHLORIDE 0.9 % IV BOLUS (SEPSIS)
1000.0000 mL | Freq: Once | INTRAVENOUS | Status: AC
  Administered 2021-02-24: 1000 mL via INTRAVENOUS

## 2021-02-24 MED ORDER — SODIUM CHLORIDE 0.9 % IV SOLN
2.0000 g | Freq: Once | INTRAVENOUS | Status: AC
  Administered 2021-02-24: 2 g via INTRAVENOUS
  Filled 2021-02-24: qty 2

## 2021-02-24 MED ORDER — SODIUM CHLORIDE 0.9 % IV SOLN: INTRAVENOUS | Status: AC

## 2021-02-24 MED ORDER — LIDOCAINE HCL (PF) 1 % IJ SOLN
10.0000 mL | Freq: Once | INTRAMUSCULAR | Status: AC
  Administered 2021-02-24: 10 mL
  Filled 2021-02-24: qty 10

## 2021-02-24 MED ORDER — VANCOMYCIN HCL IN DEXTROSE 1-5 GM/200ML-% IV SOLN
1000.0000 mg | Freq: Once | INTRAVENOUS | Status: AC
  Administered 2021-02-24: 1000 mg via INTRAVENOUS
  Filled 2021-02-24: qty 200

## 2021-02-24 NOTE — Hospital Course (Addendum)
Tyler Alvarez is a 45 year old male admitted for buttock abscess/cellulitis.  PMH significant for tuberosclerosis, s/p renal transplant in 2021, hypertension, ADHD, aortic aneurysm, RLS and GERD.  Hospital course as outlined below:  Buttock abscess/cellulitis in setting of sepsis Patient presented with bilateral buttock abscess and cellulitis.  He underwent I&D and ED of abscess.  He presented meeting sepsis criteria with fever to 102.6 Fahrenheit, leukocytosis with left shift with WBC 16.2.  He received 30 mg/kg fluid bolus.  He was started on vancomycin and cefepime due to immunocompromise state.  He also endorsed cough, but no dyspnea or hypoxia, and chest x-ray suggested pneumonia with right base airspace opacity, but fortunately both antibiotics covered for acute CAP.  HIV was nonreactive.  MRSA swab negative.  He received intravenous fluids and antibiotics (vancomycin and cefepime) for 5 days, and was discharged on oral antibiotics (Augmentin and doxycycline) for an additional 5 days.  History of renal transplant, immunosuppressed Serum creatinine on admission was 2.26, slightly above baseline of 2.  Nephrology followed patient throughout his admission and recommended monitoring vancomycin levels, IV fluids and resuming home medications including tacrolimus and prednisone.  Renal function was monitored with daily labs with creatinine of 1.83 on discharge.  Hypertension Patient initially presented with an episode of hypotension of 90s over 60s, and home amlodipine was held.  Once he was normotensive, amlodipine was resumed with BP ranging from 110-130s over 70s-80s.  Follow-up items for PCP 1.  Ensure abscess is healing appropriately and antibiotic treatment course has been completed

## 2021-02-24 NOTE — ED Triage Notes (Signed)
Pt arrives ambulatory to ED with c/o multiple boils to his bottom X1 week.

## 2021-02-24 NOTE — Progress Notes (Signed)
Pharmacy Antibiotic Note  Tyler Alvarez is a 45 y.o. male admitted on 02/24/2021 with sepsis.  Pharmacy has been consulted for vancomycin and cefepime dosing.  Patient with hx of kidney transplant presents with concern for abscess. SCr 2.23 (BL 1.8-2). Tmax 101.6, WBC 16.2, lactate 1.3.   Plan: Vancomycin loading dose 1,000 mg IV once Vancomycin 1250 mg IV q48 hours       eAUC 540.6 using SCr 2.26 Cefepime 2 g IV q12 hours Monitor cultures, labs, vancomycin levels, and clinical improvement as appropriate  Height: 6' (182.9 cm) Weight: 57.6 kg (127 lb) IBW/kg (Calculated) : 77.6  Temp (24hrs), Avg:100.6 F (38.1 C), Min:99.6 F (37.6 C), Max:101.6 F (38.7 C)  No results for input(s): WBC, CREATININE, LATICACIDVEN, VANCOTROUGH, VANCOPEAK, VANCORANDOM, GENTTROUGH, GENTPEAK, GENTRANDOM, TOBRATROUGH, TOBRAPEAK, TOBRARND, AMIKACINPEAK, AMIKACINTROU, AMIKACIN in the last 168 hours.  CrCl cannot be calculated (Patient's most recent lab result is older than the maximum 21 days allowed.).    Allergies  Allergen Reactions   Erythromycin Nausea And Vomiting   Sulfa Antibiotics Itching and Nausea And Vomiting    Antimicrobials this admission: Vancomycin 10/13>> Cefepime 10/13>>  Microbiology results: 10/13 BCx: in process  Thank you for allowing pharmacy to be a part of this patient's care.  Donald Pore 02/24/2021 8:30 PM

## 2021-02-24 NOTE — ED Provider Notes (Signed)
[facility] EMERGENCY DEPT Provider Note   CSN: [id number] Arrival date & time: 02/24/21  1553     History Chief Complaint  Patient presents with   Abscess    Tyler Alvarez is a 45 y.o. male.  Presents to ER with concern for abscess.  Patient states that over the past week or so he has noted multiple boils/abscesses on his buttocks.  On both sides.  Had chills, low-grade fever up to 100.7 at home.  Also endorsed general fatigue, malaise.  No cough, no dysuria, no other lesions or rashes elsewhere.  No abdominal pain.  Patient has history of kidney transplant, on Prograf and prednisone chronically.  HPI     Past Medical History:  Diagnosis Date   ADD (attention deficit disorder)    Anxiety    Benign skin lesion of neck    left neck cyst   Chronic kidney disease    s/p transplant 2001   Family history of adverse reaction to anesthesia    Pt mother stated Father-"I think that he stopped breathing, they called a code but he came through okay"   GERD (gastroesophageal reflux disease)    Headache    Migraines   Heart murmur    Hypertension    Immune deficiency disorder ([place])    Restless leg syndrome    Seizures ([place])    treated by Dr. 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[place])    Wears glasses     Patient Active Problem List   Diagnosis Date Noted   Right foot pain 06/16/2020   Need for immunization against influenza 01/20/2020   Anemia 01/20/2020   Electrolyte abnormality 01/20/2020   Sepsis without acute organ dysfunction ([place])    Gastroesophageal reflux disease    Community acquired pneumonia 01/03/2020   Neck pain 09/18/2019   Acute-on-chronic kidney injury ([place]) 09/06/2019   Gout 10/09/2017   CKD (chronic kidney disease), stage III ([place]) 10/09/2017   Normocytic anemia    Renal transplant, status post    Essential hypertension    Aneurysm ([place]) 07/16/2014   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[place]) 04/05/2012   HEARING LOSS, LEFT  EAR 11/25/2008    Past Surgical History:  Procedure Laterality Date   AV FISTULA PLACEMENT     KIDNEY TRANSPLANT  2001   MASS EXCISION Left 12/11/2016   Procedure: EXCISION OF BENIGN LESION OF THE NECK WITH LAYERED CLOSURE;  Surgeon: Irene Limbo, MD;  Location: [facility];  Service: Plastics;  Laterality: Left;   MASS EXCISION     neck x2   MASS EXCISION Bilateral 04/01/2018   Procedure: excision benign lesions left neck and right and left cheek ( 4cm, 2cm, 1cm x3), layered closure neck <10cm, layered closure right cheek 1cm;  Surgeon: Irene Limbo, MD;  Location: [facility];  Service: Plastics;  Laterality: Bilateral;  left cheek lesion   MULTIPLE TOOTH EXTRACTIONS     NEPHRECTOMY     both removed in 1999, 3 months apart   REVISON OF ARTERIOVENOUS FISTULA Left 07/16/2014   Procedure: EXCISION ANEURYSMAL AREA OF LEFT ARTERIOVENOUS FISTULA;  Surgeon: Serafina Mitchell, MD;  Location: [facility];  Service: Vascular;  Laterality: Left;       Family History  Problem Relation Age of Onset   Hypothyroidism Mother    Cancer Mother 85       cervical   Hypertension Mother    COPD Mother    COPD Father 56  COPD   Ulcers Father    COPD Brother    Heart disease Brother    Cancer Other     Social History   Tobacco Use   Smoking status: Never   Smokeless tobacco: Never  Vaping Use   Vaping Use: Never used  Substance Use Topics   Alcohol use: No   Drug use: No    Home Medications Prior to Admission medications   Medication Sig Start Date End Date Taking? Authorizing Provider  acetaminophen (TYLENOL) 500 MG tablet Take 500 mg by mouth daily as needed for mild pain or headache.    [provider]  amLODipine (NORVASC) 5 MG tablet Take 1 tablet (5 mg total) by mouth daily. 09/09/19   Pokhrel, Corrie Mckusick, MD  aspirin EC 81 MG tablet Take 81 mg by mouth daily.    [provider]  brompheniramine-pseudoephedrine (DIMETAPP) 1-15 MG/5ML ELIX Take 20 mLs by mouth daily as  needed for allergies.    [provider]  dextromethorphan-guaiFENesin (MUCINEX DM) 30-600 MG 12hr tablet Take 1 tablet by mouth 2 (two) times daily. 01/06/20   Barton Dubois, MD  diclofenac Sodium (VOLTAREN) 1 % GEL APPLY 4 G TOPICALLY 2 (TWO) TIMES DAILY AS NEEDED. Patient not taking: Reported on 01/04/2020 10/16/19   Milus Banister C, DO  divalproex (DEPAKOTE) 250 MG DR tablet Take 1 tablet (250 mg total) by mouth 2 (two) times daily. Only takes 1 per day 03/04/20   Milus Banister C, DO  gabapentin (NEURONTIN) 300 MG capsule TAKE 1 CAPSULE BY MOUTH EVERYDAY AT BEDTIME 02/09/21   Ganta, Anupa, DO  imipramine (TOFRANIL) 25 MG tablet Take 50 mg by mouth at bedtime.     [provider]  Ipratropium-Albuterol (COMBIVENT) 20-100 MCG/ACT AERS respimat Inhale 1 puff into the lungs every 6 (six) hours as needed for wheezing or shortness of breath. 01/06/20   Barton Dubois, MD  omeprazole (PRILOSEC) 20 MG capsule Take 20 mg by mouth daily. Patient not taking: Reported on 01/04/2020    [provider]  predniSONE (DELTASONE) 5 MG tablet Take 5 mg by mouth daily.     [provider]  tacrolimus (PROGRAF) 1 MG capsule Take 2 mg by mouth 2 (two) times daily.     [provider]    Allergies    Erythromycin and Sulfa antibiotics  Review of Systems   Review of Systems  Constitutional:  Positive for chills, fatigue and fever.  HENT:  Negative for ear pain and sore throat.   Eyes:  Negative for pain and visual disturbance.  Respiratory:  Negative for cough and shortness of breath.   Cardiovascular:  Negative for chest pain and palpitations.  Gastrointestinal:  Negative for abdominal pain and vomiting.  Genitourinary:  Negative for dysuria and hematuria.  Musculoskeletal:  Negative for arthralgias and back pain.  Skin:  Positive for color change, rash and wound.  Neurological:  Negative for seizures and syncope.  All other systems reviewed and are  negative.  Physical Exam Updated Vital Signs BP (!) 144/81   Pulse (!) 102   Temp 99.9 F (37.7 C) (Oral)   Resp (!) 24   Ht 6' (1.829 m)   Wt 57.6 kg   SpO2 99%   BMI 17.22 kg/m   Physical Exam Vitals and nursing note reviewed.  Constitutional:      Appearance: He is well-developed.  HENT:     Head: Normocephalic and atraumatic.  Eyes:     Conjunctiva/sclera: Conjunctivae normal.  Cardiovascular:     Rate and Rhythm: Regular rhythm. Tachycardia present.     Heart sounds: No murmur heard. Pulmonary:     Effort: Pulmonary effort is normal. No respiratory distress.     Breath sounds: Normal breath sounds.  Abdominal:     Palpations: Abdomen is soft.     Tenderness: There is no abdominal tenderness.  Genitourinary:    Comments: Rosanna Randy RN chaperone  On right buttocks there is 4cm diameter area of raised erythema, induration; on left buttocks there is 3cm dia area of raised erythema, induration  Perineum soft nontender, nonerythematous; testicles and penis grossly normal in appearance Musculoskeletal:     Cervical back: Neck supple.  Skin:    General: Skin is warm and dry.  Neurological:     General: No focal deficit present.     Mental Status: He is alert.  Psychiatric:        Mood and Affect: Mood normal.    ED Results / Procedures / Treatments   Labs (all labs ordered are listed, but only abnormal results are displayed) Labs Reviewed  COMPREHENSIVE METABOLIC PANEL - Abnormal; Notable for the following components:      Result Value   Glucose, Bld 106 (*)    BUN 32 (*)    Creatinine, Ser 2.26 (*)    GFR, Estimated 35 (*)    All other components within normal limits  CBC WITH DIFFERENTIAL/PLATELET - Abnormal; Notable for the following components:   WBC 16.2 (*)    RBC 3.99 (*)    Hemoglobin 12.2 (*)    HCT 35.2 (*)    Neutro Abs 13.0 (*)    Monocytes Absolute 1.1 (*)    Abs Immature Granulocytes 0.08 (*)    All other components within normal limits   RESP PANEL BY RT-PCR (FLU A&B, COVID) ARPGX2  CULTURE, BLOOD (ROUTINE X 2)  CULTURE, BLOOD (ROUTINE X 2)  AEROBIC/ANAEROBIC CULTURE W GRAM STAIN (SURGICAL/DEEP WOUND)  LACTIC ACID, PLASMA  PROTIME-INR  LACTIC ACID, PLASMA  URINALYSIS, ROUTINE W REFLEX MICROSCOPIC    EKG None  Radiology CT PELVIS WO CONTRAST  Result Date: 02/24/2021 CLINICAL DATA:  Abscess, anal or rectal bilateral buttocks abscesses. No IV contrast due to low GFR. EXAM: CT PELVIS WITHOUT CONTRAST TECHNIQUE: Multidetector CT imaging of the pelvis was performed following the standard protocol without intravenous contrast. COMPARISON:  09/06/2019 FINDINGS: Urinary Tract: Bladder wall is mildly thickened, possibly cystitis. Pelvic kidney, likely a transplant. No hydronephrosis or hydroureter. Bowel: Visualized portions of large and small bowel are not abnormally distended. No wall thickening or inflammatory changes. Appendix appears normal. Vascular/Lymphatic: Mild calcification in the aorta and iliac arteries. No aneurysm. Reproductive: Prostate gland is not enlarged. Visualized scrotal contents are unremarkable. Other: No free air or free fluid in the abdomen. Abdominal wall musculature appears intact. Perineal collection in the subcutaneous soft tissues to the right of the gluteal crease, measuring 2.4 cm diameter. Surrounding infiltration in the subcutaneous fat. Changes are consistent with soft tissue abscess. No fistulas identified. There are 2 additional collections in the subcutaneous fat superficial to the inferior left gluteal muscles. One of the collections posterior to the ischium measures about 1.4 cm and a lateral collection measures about 8 mm in diameter. Skin thickening and stranding in the subcutaneous fat consistent with cellulitis. No fistulas identified. Musculoskeletal: Bones appear intact. No cortical destruction or erosion to suggest osteomyelitis. IMPRESSION: 1. Subcutaneous soft tissue collections in the  peroneal fat to the right of the gluteal  crease and over the distal left gluteal muscles with surrounding stranding. Changes are consistent with soft tissue abscesses and cellulitis. No fistulas identified. 2. Left transplant pelvic kidney without hydronephrosis. 3. Bladder wall is mildly thickened, possibly cystitis. Electronically Signed   By: Lucienne Capers M.D.   On: 02/24/2021 21:27    Procedures .Marland KitchenIncision and Drainage  Date/Time: 02/24/2021 10:44 PM Performed by: Lucrezia Starch, MD Authorized by: Lucrezia Starch, MD   Consent:    Consent obtained:  Verbal   Risks discussed:  Bleeding and infection   Alternatives discussed:  No treatment Location:    Type:  Abscess   Size:  3   Location:  Anogenital   Anogenital location:  Gluteal cleft Pre-procedure details:    Skin preparation:  Povidone-iodine Sedation:    Sedation type:  None Anesthesia:    Anesthesia method:  Local infiltration   Local anesthetic:  Lidocaine 1% w/o epi Procedure type:    Complexity:  Complex Procedure details:    Ultrasound guidance: no     Needle aspiration: no     Incision types:  Single straight   Wound management:  Probed and deloculated and extensive cleaning   Drainage:  Bloody and purulent   Drainage amount:  Copious   Wound treatment:  Drain placed   Packing materials:  1/2 in iodoform gauze Post-procedure details:    Procedure completion:  Tolerated Comments:     There were 2 discernible abscesses, 1 over the left buttocks and 1 over the right gluteal cleft.  Prepped with protein iodine, local anesthetic with 1% lidocaine, single straight incision over left buttocks with small purulent drainage, then incision made over right gluteal cleft, copious amounts of purulent drainage achieved, probed and deloculated, placed half-inch iodoform gauze, sent wound culture from gluteal cleft wound, patient tolerated well,   Medications Ordered in ED Medications  0.9 %  sodium chloride  infusion ( Intravenous New Bag/Given 02/24/21 2121)  lidocaine (PF) (XYLOCAINE) 1 % injection 10 mL (has no administration in time range)  sodium chloride 0.9 % bolus 1,000 mL (0 mLs Intravenous Stopped 02/24/21 2121)  ceFEPIme (MAXIPIME) 2 g in sodium chloride 0.9 % 100 mL IVPB (0 g Intravenous Stopped 02/24/21 2051)  vancomycin (VANCOCIN) IVPB 1000 mg/200 mL premix (1,000 mg Intravenous New Bag/Given 02/24/21 2051)  acetaminophen (TYLENOL) tablet 650 mg (650 mg Oral Given 02/24/21 2017)    ED Course  I have reviewed the triage vital signs and the nursing notes.  Pertinent labs & imaging results that were available during my care of the patient were reviewed by me and considered in my medical decision making (see chart for details).    MDM Rules/Calculators/A&P                          45 year old male presents to ER with concern for abscess on buttocks.  Has 2 moderate-sized abscesses with some overlying erythema.  Given fever, immunocompromise state being renal transplant patient on chronic prednisone and tacrolimus, started broad-spectrum antibiotics, check labs, CT imaging to better characterize possible abscesses.   Labs concerning for leukocytosis.  Creatinine slightly above baseline.  CT demonstrating superficial abscesses.  Performed bedside incision and drainage.  Achieved good drainage.  Placed half-inch iodoform gauze packing and right gluteal cleft wound.  Also sent culture from gluteal cleft wound.  Patient is family practice patient, will consult their service for admission to [facility].   Final Clinical Impression(s) / ED Diagnoses  Final diagnoses:  Fever, unspecified fever cause  Cellulitis and abscess of buttock  Leukocytosis, unspecified type    Rx / DC Orders ED Discharge Orders     None        Lucrezia Starch, MD 02/24/21 2248

## 2021-02-25 ENCOUNTER — Other Ambulatory Visit (HOSPITAL_COMMUNITY): Payer: Medicare Other

## 2021-02-25 ENCOUNTER — Observation Stay (HOSPITAL_COMMUNITY): Payer: Medicare Other

## 2021-02-25 DIAGNOSIS — K219 Gastro-esophageal reflux disease without esophagitis: Secondary | ICD-10-CM | POA: Diagnosis present

## 2021-02-25 DIAGNOSIS — L0390 Cellulitis, unspecified: Secondary | ICD-10-CM | POA: Diagnosis not present

## 2021-02-25 DIAGNOSIS — R509 Fever, unspecified: Secondary | ICD-10-CM | POA: Diagnosis not present

## 2021-02-25 DIAGNOSIS — I129 Hypertensive chronic kidney disease with stage 1 through stage 4 chronic kidney disease, or unspecified chronic kidney disease: Secondary | ICD-10-CM | POA: Diagnosis not present

## 2021-02-25 DIAGNOSIS — M109 Gout, unspecified: Secondary | ICD-10-CM | POA: Diagnosis present

## 2021-02-25 DIAGNOSIS — D84821 Immunodeficiency due to drugs: Secondary | ICD-10-CM | POA: Diagnosis present

## 2021-02-25 DIAGNOSIS — A419 Sepsis, unspecified organism: Secondary | ICD-10-CM | POA: Diagnosis not present

## 2021-02-25 DIAGNOSIS — Z7952 Long term (current) use of systemic steroids: Secondary | ICD-10-CM | POA: Diagnosis not present

## 2021-02-25 DIAGNOSIS — F419 Anxiety disorder, unspecified: Secondary | ICD-10-CM | POA: Diagnosis present

## 2021-02-25 DIAGNOSIS — L0231 Cutaneous abscess of buttock: Secondary | ICD-10-CM | POA: Diagnosis not present

## 2021-02-25 DIAGNOSIS — Z681 Body mass index (BMI) 19 or less, adult: Secondary | ICD-10-CM | POA: Diagnosis not present

## 2021-02-25 DIAGNOSIS — Z79621 Long term (current) use of calcineurin inhibitor: Secondary | ICD-10-CM | POA: Diagnosis not present

## 2021-02-25 DIAGNOSIS — Z940 Kidney transplant status: Secondary | ICD-10-CM | POA: Diagnosis not present

## 2021-02-25 DIAGNOSIS — R54 Age-related physical debility: Secondary | ICD-10-CM | POA: Diagnosis present

## 2021-02-25 DIAGNOSIS — Q851 Tuberous sclerosis: Secondary | ICD-10-CM | POA: Diagnosis not present

## 2021-02-25 DIAGNOSIS — G2581 Restless legs syndrome: Secondary | ICD-10-CM | POA: Diagnosis present

## 2021-02-25 DIAGNOSIS — I7121 Aneurysm of the ascending aorta, without rupture: Secondary | ICD-10-CM | POA: Diagnosis present

## 2021-02-25 DIAGNOSIS — D631 Anemia in chronic kidney disease: Secondary | ICD-10-CM | POA: Diagnosis present

## 2021-02-25 DIAGNOSIS — N1832 Chronic kidney disease, stage 3b: Secondary | ICD-10-CM | POA: Diagnosis not present

## 2021-02-25 DIAGNOSIS — G40909 Epilepsy, unspecified, not intractable, without status epilepticus: Secondary | ICD-10-CM | POA: Diagnosis present

## 2021-02-25 DIAGNOSIS — Z20822 Contact with and (suspected) exposure to covid-19: Secondary | ICD-10-CM | POA: Diagnosis present

## 2021-02-25 DIAGNOSIS — Z23 Encounter for immunization: Secondary | ICD-10-CM | POA: Diagnosis not present

## 2021-02-25 DIAGNOSIS — F909 Attention-deficit hyperactivity disorder, unspecified type: Secondary | ICD-10-CM | POA: Diagnosis present

## 2021-02-25 DIAGNOSIS — L03317 Cellulitis of buttock: Secondary | ICD-10-CM

## 2021-02-25 DIAGNOSIS — H9192 Unspecified hearing loss, left ear: Secondary | ICD-10-CM | POA: Diagnosis present

## 2021-02-25 DIAGNOSIS — I959 Hypotension, unspecified: Secondary | ICD-10-CM | POA: Diagnosis present

## 2021-02-25 HISTORY — DX: Sepsis, unspecified organism: A41.9

## 2021-02-25 LAB — CBC WITH DIFFERENTIAL/PLATELET
Abs Immature Granulocytes: 0.1 10*3/uL — ABNORMAL HIGH (ref 0.00–0.07)
Basophils Absolute: 0.1 10*3/uL (ref 0.0–0.1)
Basophils Relative: 1 %
Eosinophils Absolute: 0.2 10*3/uL (ref 0.0–0.5)
Eosinophils Relative: 2 %
HCT: 28.8 % — ABNORMAL LOW (ref 39.0–52.0)
Hemoglobin: 10 g/dL — ABNORMAL LOW (ref 13.0–17.0)
Immature Granulocytes: 1 %
Lymphocytes Relative: 11 %
Lymphs Abs: 1.6 10*3/uL (ref 0.7–4.0)
MCH: 30.9 pg (ref 26.0–34.0)
MCHC: 34.7 g/dL (ref 30.0–36.0)
MCV: 88.9 fL (ref 80.0–100.0)
Monocytes Absolute: 1.3 10*3/uL — ABNORMAL HIGH (ref 0.1–1.0)
Monocytes Relative: 8 %
Neutro Abs: 11.9 10*3/uL — ABNORMAL HIGH (ref 1.7–7.7)
Neutrophils Relative %: 77 %
Platelets: 187 10*3/uL (ref 150–400)
RBC: 3.24 MIL/uL — ABNORMAL LOW (ref 4.22–5.81)
RDW: 12.7 % (ref 11.5–15.5)
WBC: 15.2 10*3/uL — ABNORMAL HIGH (ref 4.0–10.5)
nRBC: 0 % (ref 0.0–0.2)

## 2021-02-25 LAB — URINALYSIS, ROUTINE W REFLEX MICROSCOPIC
Bacteria, UA: NONE SEEN
Bilirubin Urine: NEGATIVE
Glucose, UA: NEGATIVE mg/dL
Hgb urine dipstick: NEGATIVE
Ketones, ur: 5 mg/dL — AB
Leukocytes,Ua: NEGATIVE
Nitrite: NEGATIVE
Protein, ur: 30 mg/dL — AB
Specific Gravity, Urine: 1.008 (ref 1.005–1.030)
pH: 5 (ref 5.0–8.0)

## 2021-02-25 LAB — PROCALCITONIN: Procalcitonin: 0.39 ng/mL

## 2021-02-25 LAB — RENAL FUNCTION PANEL
Albumin: 3.4 g/dL — ABNORMAL LOW (ref 3.5–5.0)
Anion gap: 12 (ref 5–15)
BUN: 27 mg/dL — ABNORMAL HIGH (ref 6–20)
CO2: 20 mmol/L — ABNORMAL LOW (ref 22–32)
Calcium: 9 mg/dL (ref 8.9–10.3)
Chloride: 104 mmol/L (ref 98–111)
Creatinine, Ser: 2.25 mg/dL — ABNORMAL HIGH (ref 0.61–1.24)
GFR, Estimated: 36 mL/min — ABNORMAL LOW (ref >60)
Glucose, Bld: 94 mg/dL (ref 70–99)
Phosphorus: 2.4 mg/dL — ABNORMAL LOW (ref 2.5–4.6)
Potassium: 3.9 mmol/L (ref 3.5–5.1)
Sodium: 136 mmol/L (ref 135–145)

## 2021-02-25 LAB — MRSA NEXT GEN BY PCR, NASAL: MRSA by PCR Next Gen: NOT DETECTED

## 2021-02-25 LAB — LACTIC ACID, PLASMA
Lactic Acid, Venous: 2.3 mmol/L (ref 0.5–1.9)
Lactic Acid, Venous: 0.7 mmol/L (ref 0.5–1.9)

## 2021-02-25 LAB — HIV ANTIBODY (ROUTINE TESTING W REFLEX): HIV Screen 4th Generation wRfx: NONREACTIVE

## 2021-02-25 MED ORDER — SODIUM CHLORIDE 0.9 % IV BOLUS (SEPSIS)
250.0000 mL | Freq: Once | INTRAVENOUS | Status: AC
  Administered 2021-02-25: 250 mL via INTRAVENOUS

## 2021-02-25 MED ORDER — FAMOTIDINE 20 MG PO TABS: 20.0000 mg | ORAL_TABLET | Freq: Every day | ORAL | Status: DC | PRN

## 2021-02-25 MED ORDER — GABAPENTIN 300 MG PO CAPS
300.0000 mg | ORAL_CAPSULE | Freq: Every day | ORAL | Status: DC
  Administered 2021-02-25 – 2021-02-26 (×2): 300 mg via ORAL
  Filled 2021-02-25 (×3): qty 1

## 2021-02-25 MED ORDER — PNEUMOCOCCAL VAC POLYVALENT 25 MCG/0.5ML IJ INJ
0.5000 mL | INJECTION | INTRAMUSCULAR | Status: AC
Start: 2021-02-26 — End: 2021-02-28
  Administered 2021-02-28: 0.5 mL via INTRAMUSCULAR
  Filled 2021-02-25: qty 0.5

## 2021-02-25 MED ORDER — ACETAMINOPHEN 500 MG PO TABS
1000.0000 mg | ORAL_TABLET | Freq: Four times a day (QID) | ORAL | Status: DC | PRN
  Administered 2021-02-25: 1000 mg via ORAL
  Filled 2021-02-25 (×3): qty 2

## 2021-02-25 MED ORDER — PREDNISONE 5 MG PO TABS
5.0000 mg | ORAL_TABLET | Freq: Every day | ORAL | Status: DC
  Administered 2021-02-25 – 2021-02-28 (×4): 5 mg via ORAL
  Filled 2021-02-25 (×4): qty 1

## 2021-02-25 MED ORDER — SODIUM CHLORIDE 0.9 % IV SOLN
2.0000 g | Freq: Two times a day (BID) | INTRAVENOUS | Status: DC
  Administered 2021-02-25 – 2021-02-28 (×7): 2 g via INTRAVENOUS
  Filled 2021-02-25 (×7): qty 2

## 2021-02-25 MED ORDER — DIVALPROEX SODIUM 250 MG PO DR TAB
250.0000 mg | DELAYED_RELEASE_TABLET | Freq: Every day | ORAL | Status: DC
  Administered 2021-02-25 – 2021-02-27 (×3): 250 mg via ORAL
  Filled 2021-02-25 (×3): qty 1

## 2021-02-25 MED ORDER — ENOXAPARIN SODIUM 40 MG/0.4ML IJ SOSY
40.0000 mg | PREFILLED_SYRINGE | INTRAMUSCULAR | Status: DC
  Administered 2021-02-25 – 2021-02-28 (×4): 40 mg via SUBCUTANEOUS
  Filled 2021-02-25 (×4): qty 0.4

## 2021-02-25 MED ORDER — TACROLIMUS 1 MG PO CAPS
2.0000 mg | ORAL_CAPSULE | Freq: Two times a day (BID) | ORAL | Status: DC
  Administered 2021-02-25 – 2021-02-28 (×7): 2 mg via ORAL
  Filled 2021-02-25 (×7): qty 2

## 2021-02-25 MED ORDER — INFLUENZA VAC SPLIT QUAD 0.5 ML IM SUSY
0.5000 mL | PREFILLED_SYRINGE | INTRAMUSCULAR | Status: AC
  Administered 2021-02-28: 0.5 mL via INTRAMUSCULAR

## 2021-02-25 MED ORDER — PROMETHAZINE HCL 25 MG PO TABS
12.5000 mg | ORAL_TABLET | Freq: Once | ORAL | Status: DC
  Filled 2021-02-25: qty 1

## 2021-02-25 MED ORDER — IMIPRAMINE HCL 25 MG PO TABS
50.0000 mg | ORAL_TABLET | Freq: Every day | ORAL | Status: DC
  Administered 2021-02-25 – 2021-02-27 (×3): 50 mg via ORAL
  Filled 2021-02-25 (×4): qty 2

## 2021-02-25 MED ORDER — SODIUM CHLORIDE 0.9 % IV BOLUS (SEPSIS)
500.0000 mL | Freq: Once | INTRAVENOUS | Status: AC
  Administered 2021-02-25: 500 mL via INTRAVENOUS

## 2021-02-25 MED ORDER — SODIUM CHLORIDE 0.9 % IV BOLUS (SEPSIS)
1000.0000 mL | Freq: Once | INTRAVENOUS | Status: AC
  Administered 2021-02-25: 1000 mL via INTRAVENOUS

## 2021-02-25 MED ORDER — MELATONIN 5 MG PO TABS
5.0000 mg | ORAL_TABLET | Freq: Every evening | ORAL | Status: DC | PRN
  Administered 2021-02-25: 5 mg via ORAL
  Filled 2021-02-25: qty 1

## 2021-02-25 MED ORDER — VANCOMYCIN HCL 1250 MG/250ML IV SOLN
1250.0000 mg | INTRAVENOUS | Status: DC
  Administered 2021-02-26 – 2021-02-28 (×2): 1250 mg via INTRAVENOUS
  Filled 2021-02-25 (×3): qty 250

## 2021-02-25 MED ORDER — PREDNISONE 5 MG PO TABS: 5.0000 mg | ORAL_TABLET | Freq: Every day | ORAL | Status: DC

## 2021-02-25 NOTE — Consult Note (Signed)
[name] Nephrology Consultation Note  Requesting MD: Dr. Ardelia Mems, B Reason for consult: Kidney transplant  HPI:  Tyler Alvarez is a 45 y.o. male with a past medical history of tuberous sclerosis angiomyolipoma, ESRD status post living unrelated kidney transplant in 02/2000 at [facility], post transplant CKD stage III with baseline serum creatinine level around 2, hypertension, ADHD, acid reflux who was admitted with multiple boils on his bottoms, fever, seen as a consultation because of kidney transplant status. In the ER the patient underwent I&D of the abscess and then he started on broad-spectrum antibiotics including vancomycin and cefepime.  He was febrile to 101.3 max which is improved to normal temperature this morning. The labs showed serum creatinine level is stable at 2.25, potassium 3.9.  The lactic acid level was elevated to 2.3 on admission which improved to 0.7 after IV hydration and treatment.  WBC remains elevated at 15.2.  The urinalysis with minimal protein without any hematuria. Apparently patient reported some tenderness 1 out of 10 during palpation today which promoted this consultation.  Patient stated he has not passed a stool since the boil I&D.  He also feels like wanted to urinate.  He does not have any pain unless someone press it.  Denies any urinary complaint. He is on Prograf 2 mg twice a day and prednisone per immunosuppression.  Follows closely with Dr. Joelyn Oms at [facility].   Creatinine, Ser  Date/Time Value Ref Range Status  02/25/2021 02:04 AM 2.25 (H) 0.61 - 1.24 mg/dL Final  02/24/2021 08:15 PM 2.26 (H) 0.61 - 1.24 mg/dL Final  01/16/2020 11:12 AM 2.01 (H) 0.76 - 1.27 mg/dL Final  01/05/2020 05:33 AM 1.82 (H) 0.61 - 1.24 mg/dL Final  01/04/2020 06:12 AM 1.88 (H) 0.61 - 1.24 mg/dL Final  01/03/2020 03:22 PM 2.15 (H) 0.61 - 1.24 mg/dL Final    PMHx:   Past Medical History:  Diagnosis Date   ADD (attention deficit disorder)     Anxiety    Benign skin lesion of neck    left neck cyst   Chronic kidney disease    s/p transplant 2001   Family history of adverse reaction to anesthesia    Pt mother stated Father-"I think that he stopped breathing, they called a code but he came through okay"   GERD (gastroesophageal reflux disease)    Headache    Migraines   Heart murmur    Hypertension    Immune deficiency disorder ([place])    Restless leg syndrome    Seizures ([place])    treated by Dr. 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[place])    Wears glasses     Past Surgical History:  Procedure Laterality Date   AV FISTULA PLACEMENT     KIDNEY TRANSPLANT  2001   MASS EXCISION Left 12/11/2016   Procedure: EXCISION OF BENIGN LESION OF THE NECK WITH LAYERED CLOSURE;  Surgeon: Tyler Limbo, MD;  Location: [place];  Service: Plastics;  Laterality: Left;   MASS EXCISION     neck x2   MASS EXCISION Bilateral 04/01/2018   Procedure: excision benign lesions left neck and right and left cheek ( 4cm, 2cm, 1cm x3), layered closure neck <10cm, layered closure right cheek 1cm;  Surgeon: Tyler Limbo, MD;  Location: [place];  Service: Plastics;  Laterality: Bilateral;  left cheek lesion   MULTIPLE TOOTH EXTRACTIONS     NEPHRECTOMY     both removed in 1999, 3 months apart  REVISON OF ARTERIOVENOUS FISTULA Left 07/16/2014   Procedure: EXCISION ANEURYSMAL AREA OF LEFT ARTERIOVENOUS FISTULA;  Surgeon: Tyler Mitchell, MD;  Location: [facility];  Service: Vascular;  Laterality: Left;    Family Hx:  Family History  Problem Relation Age of Onset   Hypothyroidism Mother    Cancer Mother 47       cervical   Hypertension Mother    COPD Mother    COPD Father 46       COPD   Ulcers Father    COPD Brother    Heart disease Brother    Cancer Other     Social History:  reports that he has never smoked. He has never used smokeless tobacco. He reports that he does not drink alcohol and does not use drugs.  Allergies:   Allergies  Allergen Reactions   Erythromycin Nausea And Vomiting   Sulfa Antibiotics Itching and Nausea And Vomiting    Medications: Prior to Admission medications   Medication Sig Start Date End Date Taking? Authorizing Provider  acetaminophen (TYLENOL) 500 MG tablet Take 500 mg by mouth daily as needed for mild pain, headache or fever.   Yes [provider]  amLODipine (NORVASC) 5 MG tablet Take 1 tablet (5 mg total) by mouth daily. 09/09/19  Yes Pokhrel, Laxman, MD  brompheniramine-pseudoephedrine (DIMETAPP) 1-15 MG/5ML ELIX Take 20 mLs by mouth daily as needed for allergies.   Yes [provider]  divalproex (DEPAKOTE) 250 MG DR tablet Take 1 tablet (250 mg total) by mouth 2 (two) times daily. Only takes 1 per day Patient taking differently: Take 250 mg by mouth at bedtime. 03/04/20  Yes Milus Banister C, DO  gabapentin (NEURONTIN) 300 MG capsule TAKE 1 CAPSULE BY MOUTH EVERYDAY AT BEDTIME Patient taking differently: Take 300 mg by mouth at bedtime. 02/09/21  Yes Ganta, Anupa, DO  imipramine (TOFRANIL) 25 MG tablet Take 50 mg by mouth at bedtime.    Yes [provider]  predniSONE (DELTASONE) 5 MG tablet Take 5 mg by mouth daily.    Yes [provider]  tacrolimus (PROGRAF) 1 MG capsule Take 2 mg by mouth 2 (two) times daily.    Yes [provider]    I have reviewed the patient's current medications.  Labs:  Results for orders placed or performed during the hospital encounter of 02/24/21 (from the past 48 hour(s))  Lactic acid, plasma     Status: None   Collection Time: 02/24/21  8:15 PM  Result Value Ref Range   Lactic Acid, Venous 1.3 0.5 - 1.9 mmol/L    Comment: Performed at [facility], [place], [place] [address]  Comprehensive metabolic panel     Status: Abnormal   Collection Time: 02/24/21  8:15 PM  Result Value Ref Range   Sodium 138 135 - 145 mmol/L   Potassium 3.9 3.5 - 5.1 mmol/L    Chloride 101 98 - 111 mmol/L   CO2 26 22 - 32 mmol/L   Glucose, Bld 106 (H) 70 - 99 mg/dL    Comment: Glucose reference range applies only to samples taken after fasting for at least 8 hours.   BUN 32 (H) 6 - 20 mg/dL   Creatinine, Ser 2.26 (H) 0.61 - 1.24 mg/dL   Calcium 9.6 8.9 - 10.3 mg/dL   Total Protein 7.2 6.5 - 8.1 g/dL   Albumin 4.1 3.5 - 5.0 g/dL   AST 15 15 - 41 U/L   ALT 10  0 - 44 U/L   Alkaline Phosphatase 61 38 - 126 U/L   Total Bilirubin 0.9 0.3 - 1.2 mg/dL   GFR, Estimated 35 (L) >60 mL/min    Comment: (NOTE) Calculated using the CKD-EPI Creatinine Equation (2021)    Anion gap 11 5 - 15    Comment: Performed at [facility], [address] [place], [place] [address]  CBC WITH DIFFERENTIAL     Status: Abnormal   Collection Time: 02/24/21  8:15 PM  Result Value Ref Range   WBC 16.2 (H) 4.0 - 10.5 K/uL   RBC 3.99 (L) 4.22 - 5.81 MIL/uL   Hemoglobin 12.2 (L) 13.0 - 17.0 g/dL   HCT 35.2 (L) 39.0 - 52.0 %   MCV 88.2 80.0 - 100.0 fL   MCH 30.6 26.0 - 34.0 pg   MCHC 34.7 30.0 - 36.0 g/dL   RDW 12.8 11.5 - 15.5 %   Platelets 168 150 - 400 K/uL   nRBC 0.0 0.0 - 0.2 %   Neutrophils Relative % 80 %   Neutro Abs 13.0 (H) 1.7 - 7.7 K/uL   Lymphocytes Relative 10 %   Lymphs Abs 1.7 0.7 - 4.0 K/uL   Monocytes Relative 7 %   Monocytes Absolute 1.1 (H) 0.1 - 1.0 K/uL   Eosinophils Relative 2 %   Eosinophils Absolute 0.3 0.0 - 0.5 K/uL   Basophils Relative 0 %   Basophils Absolute 0.1 0.0 - 0.1 K/uL   Immature Granulocytes 1 %   Abs Immature Granulocytes 0.08 (H) 0.00 - 0.07 K/uL    Comment: Performed at [facility], [address] [place], [place] [address]  Protime-INR     Status: None   Collection Time: 02/24/21  8:15 PM  Result Value Ref Range   Prothrombin Time 12.7 11.4 - 15.2 seconds   INR 1.0 0.8 - 1.2    Comment: (NOTE) INR goal varies based on device and disease states. Performed at [facility],  [address] [place], [place] [address]   Blood Culture (routine x 2)     Status: None (Preliminary result)   Collection Time: 02/24/21  8:15 PM   Specimen: BLOOD  Result Value Ref Range   Specimen Description      BLOOD BLOOD RIGHT ARM UPPER Performed at [facility], [address], [place], [place] [address]    Special Requests      BOTTLES DRAWN AEROBIC AND ANAEROBIC Blood Culture adequate volume Performed at [facility], [address] [place], [place] [address]    Culture      NO GROWTH < 12 HOURS Performed at [facility] [address], [place], [place] [address]    Report Status PENDING   Resp Panel by RT-PCR (Flu A&B, Covid) Nasopharyngeal Swab     Status: None   Collection Time: 02/24/21  8:35 PM   Specimen: Nasopharyngeal Swab; Nasopharyngeal(NP) swabs in vial transport medium  Result Value Ref Range   SARS Coronavirus 2 by RT PCR NEGATIVE NEGATIVE    Comment: (NOTE) SARS-CoV-2 target nucleic acids are NOT DETECTED.  The SARS-CoV-2 RNA is generally detectable in upper respiratory specimens during the acute phase of infection. The lowest concentration of SARS-CoV-2 viral copies this assay can detect is 138 copies/mL. A negative result does not preclude SARS-Cov-2 infection and should not be used as the sole basis for treatment or other patient management decisions. A negative result may occur with  improper specimen collection/handling, submission  of specimen other than nasopharyngeal swab, presence of viral mutation(s) within the areas targeted by this assay, and inadequate number of viral copies(<138 copies/mL). A negative result must be combined with clinical observations, patient history, and epidemiological information. The expected result is Negative.  Fact Sheet for Patients:  EntrepreneurPulse.com.au  Fact Sheet for Healthcare Providers:   IncredibleEmployment.be  This test is no t yet approved or cleared by the [place] FDA and  has been authorized for detection and/or diagnosis of SARS-CoV-2 by FDA under an Emergency Use Authorization (EUA). This EUA will remain  in effect (meaning this test can be used) for the duration of the COVID-19 declaration under Section 564(b)(1) of the Act, 21 U.S.C.section 360bbb-3(b)(1), unless the authorization is terminated  or revoked sooner.       Influenza A by PCR NEGATIVE NEGATIVE   Influenza B by PCR NEGATIVE NEGATIVE    Comment: (NOTE) The Xpert Xpress SARS-CoV-2/FLU/RSV plus assay is intended as an aid in the diagnosis of influenza from Nasopharyngeal swab specimens and should not be used as a sole basis for treatment. Nasal washings and aspirates are unacceptable for Xpert Xpress SARS-CoV-2/FLU/RSV testing.  Fact Sheet for Patients: EntrepreneurPulse.com.au  Fact Sheet for Healthcare Providers: IncredibleEmployment.be  This test is not yet approved or cleared by the [place] FDA and has been authorized for detection and/or diagnosis of SARS-CoV-2 by FDA under an Emergency Use Authorization (EUA). This EUA will remain in effect (meaning this test can be used) for the duration of the COVID-19 declaration under Section 564(b)(1) of the Act, 21 U.S.C. section 360bbb-3(b)(1), unless the authorization is terminated or revoked.  Performed at [facility], [address], [place], [place] [address]   Aerobic Culture w Gram Stain (superficial specimen)     Status: None (Preliminary result)   Collection Time: 02/24/21 11:35 PM   Specimen: Abscess  Result Value Ref Range   Specimen Description      ABSCESS Performed at [facility], [address], [place], [place] [address]    Special Requests      NONE Performed at [facility], [place], [place] [address]    Gram Stain      NO WBC SEEN MODERATE GRAM POSITIVE COCCI MODERATE GRAM NEGATIVE RODS Performed at [facility], [place] [address], [place], [place] [address]    Culture PENDING    Report Status PENDING   Urinalysis, Routine w reflex microscopic     Status: Abnormal   Collection Time: 02/25/21  1:31 AM  Result Value Ref Range   Color, Urine YELLOW YELLOW   APPearance CLEAR CLEAR   Specific Gravity, Urine 1.008 1.005 - 1.030   pH 5.0 5.0 - 8.0   Glucose, UA NEGATIVE NEGATIVE mg/dL   Hgb urine dipstick NEGATIVE NEGATIVE   Bilirubin Urine NEGATIVE NEGATIVE   Ketones, ur 5 (A) NEGATIVE mg/dL   Protein, ur 30 (A) NEGATIVE mg/dL   Nitrite NEGATIVE NEGATIVE   Leukocytes,Ua NEGATIVE NEGATIVE   RBC / HPF 0-5 0 - 5 RBC/hpf   WBC, UA 0-5 0 - 5 WBC/hpf   Bacteria, UA NONE SEEN NONE SEEN    Comment: Performed at [facility], [place] [address] [place], [place] [address]  Renal function panel     Status: Abnormal   Collection Time: 02/25/21  2:04 AM  Result Value Ref Range   Sodium 136 135 - 145 mmol/L   Potassium 3.9 3.5 - 5.1 mmol/L  Chloride 104 98 - 111 mmol/L   CO2 20 (L) 22 - 32 mmol/L   Glucose, Bld 94 70 - 99 mg/dL    Comment: Glucose reference range applies only to samples taken after fasting for at least 8 hours.   BUN 27 (H) 6 - 20 mg/dL   Creatinine, Ser 2.25 (H) 0.61 - 1.24 mg/dL   Calcium 9.0 8.9 - 10.3 mg/dL   Phosphorus 2.4 (L) 2.5 - 4.6 mg/dL   Albumin 3.4 (L) 3.5 - 5.0 g/dL   GFR, Estimated 36 (L) >60 mL/min    Comment: (NOTE) Calculated using the CKD-EPI Creatinine Equation (2021)    Anion gap 12 5 - 15    Comment: Performed at [facility] [address] [place], [place] [address]  Lactic acid, plasma     Status: Abnormal   Collection Time: 02/25/21  2:26 AM  Result Value Ref Range   Lactic Acid, Venous 2.3 (HH) 0.5 - 1.9 mmol/L    Comment: CRITICAL RESULT CALLED TO, READ BACK BY AND VERIFIED WITH: Wenda Overland RN 02/25/21 0301 Wiliam Ke Performed at [facility], [place] [address] [place], [place] [address]   HIV Antibody (routine testing w rflx)     Status: None   Collection Time: 02/25/21  5:35 AM  Result Value Ref Range   HIV Screen 4th Generation wRfx Non Reactive Non Reactive    Comment: Performed at [facility], [place] [address] [place], [place] [address]  Procalcitonin     Status: None   Collection Time: 02/25/21  5:35 AM  Result Value Ref Range   Procalcitonin 0.39 ng/mL    Comment:        Interpretation: PCT (Procalcitonin) <= 0.5 ng/mL: Systemic infection (sepsis) is not likely. Local bacterial infection is possible. (NOTE)       Sepsis PCT Algorithm           Lower Respiratory Tract                                      Infection PCT Algorithm    ----------------------------     ----------------------------         PCT < 0.25 ng/mL                PCT < 0.10 ng/mL          Strongly encourage             Strongly discourage   discontinuation of antibiotics    initiation of antibiotics    ----------------------------     -----------------------------       PCT 0.25 - 0.50 ng/mL            PCT 0.10 - 0.25 ng/mL               OR       >80% decrease in PCT            Discourage initiation of                                            antibiotics      Encourage discontinuation           of antibiotics    ----------------------------     -----------------------------  PCT >= 0.50 ng/mL              PCT 0.26 - 0.50 ng/mL               AND        <80% decrease in PCT             Encourage initiation of                                             antibiotics       Encourage continuation           of antibiotics    ----------------------------     -----------------------------        PCT >= 0.50 ng/mL                  PCT > 0.50 ng/mL               AND         increase in PCT                  Strongly encourage                                       initiation of antibiotics    Strongly encourage escalation           of antibiotics                                     -----------------------------                                           PCT <= 0.25 ng/mL                                                 OR                                        > 80% decrease in PCT                                      Discontinue / Do not initiate                                             antibiotics  Performed at [facility], [address] [place], [place] [address]   CBC WITH DIFFERENTIAL     Status: Abnormal   Collection Time: 02/25/21  5:35 AM  Result Value Ref Range   WBC 15.2 (H) 4.0 - 10.5 K/uL   RBC 3.24 (L) 4.22 - 5.81 MIL/uL   Hemoglobin 10.0 (L) 13.0 - 17.0  g/dL   HCT 28.8 (L) 39.0 - 52.0 %   MCV 88.9 80.0 - 100.0 fL   MCH 30.9 26.0 - 34.0 pg   MCHC 34.7 30.0 - 36.0 g/dL   RDW 12.7 11.5 - 15.5 %   Platelets 187 150 - 400 K/uL   nRBC 0.0 0.0 - 0.2 %   Neutrophils Relative % 77 %   Neutro Abs 11.9 (H) 1.7 - 7.7 K/uL   Lymphocytes Relative 11 %   Lymphs Abs 1.6 0.7 - 4.0 K/uL   Monocytes Relative 8 %   Monocytes Absolute 1.3 (H) 0.1 - 1.0 K/uL   Eosinophils Relative 2 %   Eosinophils Absolute 0.2 0.0 - 0.5 K/uL   Basophils Relative 1 %   Basophils Absolute 0.1 0.0 - 0.1 K/uL   Immature Granulocytes 1 %   Abs Immature Granulocytes 0.10 (H) 0.00 - 0.07 K/uL    Comment: Performed at [facility] [address], [place], [place] [address]  Lactic acid, plasma     Status: None   Collection Time: 02/25/21  5:35 AM  Result Value Ref Range   Lactic Acid, Venous 0.7 0.5 - 1.9 mmol/L    Comment: Performed at [facility] [address] [place], [place] [address]  MRSA Next Gen by PCR, Nasal     Status: None   Collection Time: 02/25/21  7:18 AM   Specimen: Nasal Mucosa; Nasal Swab  Result Value Ref Range   MRSA by PCR Next Gen NOT DETECTED NOT DETECTED    Comment: (NOTE) The GeneXpert MRSA Assay (FDA  approved for NASAL specimens only), is one component of a comprehensive MRSA colonization surveillance program. It is not intended to diagnose MRSA infection nor to guide or monitor treatment for MRSA infections. Test performance is not FDA approved in patients less than 14 years old. Performed at [facility], [place] [address] [place], [place] [address]      ROS:  Pertinent items noted in HPI and remainder of comprehensive ROS otherwise negative.  Physical Exam: Vitals:   02/25/21 1006 02/25/21 1159  BP: 122/67 128/84  Pulse: 92 93  Resp: 20 17  Temp: 98.3 F (36.8 C) 98.9 F (37.2 C)  SpO2: 98% 96%     General exam: Appears calm and comfortable  Respiratory system: Clear to auscultation. Respiratory effort normal. No wheezing or crackle Cardiovascular system: S1 & S2 heard, RRR.  No pedal edema. Gastrointestinal system: Abdomen is nondistended, soft. Normal bowel sounds heard.  Barely tender on deep palpation of left lower quadrant. Central nervous system: Alert and oriented. No focal neurological deficits. Extremities: Symmetric 5 x 5 power. Skin: No rashes, lesions or ulcers Psychiatry: Judgement and insight appear normal. Mood & affect appropriate.   Assessment/Plan:  #Kidney transplant status on chronic immunosuppression: Creatinine level is around his baseline.  UA with minimal chronic protein without hematuria.  I think the pain described is probably due to not passing stool.  Given urine finding and stable GFR, I do not think there is rejection.  The patient had kidney transplant more than 20 years ago from his brother.  I would recommend to continue Prograf and prednisone at current dose. I will do bladder scan to rule out any acute urinary retention Order kidney transplant ultrasound to rule out obstruction/overt hydronephrosis.  #Chronic kidney disease stage IIIb in post transplant kidney: The serum creatinine level seems to be around baseline.  Mild  fluctuation may be due to sepsis/infection.  Continue IV hydration.  Avoid NSAIDs, IV contrast.  Monitor lab.  #Sepsis due to buttock abscess/cellulitis: Status post I&D in ER.  Currently on broad-spectrum antibiotics.  Recommend to check V[place] level.  Follow-up culture results.  #Hypertension: Agree with holding amlodipine given soft BP this morning.  If the kidney transplant ultrasound comes back unremarkable then I am planning to sign off.  Please call back with question.   Maylen Waltermire Tanna Furry 02/25/2021, 2:20 PM  [facility].

## 2021-02-25 NOTE — Progress Notes (Addendum)
Family Medicine Teaching Service Daily Progress Note Intern Pager: [phone]  Patient name: Tyler Alvarez Medical record number: [id number] Date of birth: [birth year] Age: 45 y.o. Gender: male  Primary Care Provider: Donney Dice, DO Consultants: Nephrology Code Status: Full  Pt Overview and Major Events to Date:  10/13- Admitted  Assessment and Plan: Tyler Alvarez is a 45 y.o. male admitted for buttock abscess. PMH is significant for Tuberal Sclerosis, renal transplant (Prograft), HTN, ADHD, Aortic aneurysm, RLS and GERD.    Buttock abscess/cellulitis in setting of sepsis No longer febrile. WBC 15.2 this morning, 16.2 on admission. Hgb 10.0, decreased from 12.2.  Lactic acid decreased to 0.7 from 2.3 on admission. Pt given Vancomycin and Cefepime. He received 30 mg/kg fluid bolus and is normotensive this morning with BP 119/63. MRSA swab negative. HIV non-reactive. CXR with right base airspace opacity compatible with pneumonia. He denies any dyspnea, SOB, cough. Given immunocompromised status and sepsis on admission, we will continue with broad coverage spectrum antibiotics and IVF for buttock infection. - Continue IV Cefepime, IV Vancomycin - IVF NS @75ml /hr - AM CBC, CMP - Vitals per unit routine - Cardiac monitoring - Continues pulse oximetry - Monitor for fever - Pending blood cultures x2 - Tylenol 1000mg  q6h PRN for fever, pain     Hx of Renal Transplant  Immunosuppression  CKD, Tuberous Sclerosis Patient received renal transplant from his older brother 21 years ago in Oct 2001 due to tuberous sclerosis. Takes Tacrolimus and prednisone at home. Baseline Cr ~2. SCr on admission 2.26. I consulted about this patient with nephrologist Dr. Carolin Sicks. He recommended to monitor Vancomycin level. He agrees with current IVF and resuming home medications. He recommends to monitor for fluid retention.He does not have recommendations for any other management/treatments at this time. -  Monitor renal function s/p IVF - Continue Tacrolimus 2mg  - Continue Prednisone 5mg  - Monitor renal function with daily labs - Consulted with Dr. Carolin Sicks, he reviewed patient's chart and agrees with current management. Nephrology will not follow at this time. Greatly appreciate his recommendations   Hypertension  BP while I examined the patient was 113/64. Home medication is Norvasc 5 mg daily. Holding for now in setting of one BP of 90s/60s this morning. - Hold Norvasc, resume when BP stable   Restless Leg Syndrome  Chronic, stable - Start home Gabapentin 300mg  daily at bedtime   GERD  Chronic, stable.Takes pepcid 20 mg PRN.  - Start home medications   Epilepsy  Tuberous Sclerosis Chronic, no seizures since admission. Takes Depakote 250mg  once daily at home. - Start home Depakote 250mg  daily   ADHD Chronic, stable. Takes imipramine. - Start home Imipramine 50mg  daily at bedtime  FEN/GI: Renal diet without fluid restriction PPx: Lovenox Dispo:Pending PT recommendations  pending clinical improvement . Barriers include IV antibiotics, clinical improvement.   Subjective:  Tyler Alvarez says he is feeling "nippy" and cold. He denies chest pain, shortness of breath, any pain anywhere. He lives at home with his mother and is requesting we let her know he is here. He says he has had buttock infections in the past, but not as severe as this one.  Objective: Temp:  [98.3 F (36.8 C)-102.6 F (39.2 C)] 98.3 F (36.8 C) (10/14 0757) Pulse Rate:  [78-110] 78 (10/14 0757) Resp:  [16-26] 19 (10/14 0757) BP: (94-156)/(65-94) 115/72 (10/14 0757) SpO2:  [92 %-100 %] 96 % (10/14 0757) Weight:  [57.4 kg-57.6 kg] 57.4 kg (10/14 0150) Physical Exam: General: Frail, chronically-ill,  pale, cachectic appearing male laying in bed with towel over his eyes HEENT: MMM Cardiovascular:  RRR Respiratory: Lungs clear in all fields. No wheezing, crackles. Abdomen: Thin, soft, non-tender Extremities:  Warm, dry, well-perfused Skin: Multiple flesh colored raised dome-shaped papules ~1cm over left shoulder. Bandage in place over buttock, dry with contained blood/purulent fluid noted. Neuro: Awake, alert, oriented. Asnwers questions appropriately.  Laboratory: Recent Labs  Lab 02/24/21 2015 02/25/21 0535  WBC 16.2* 15.2*  HGB 12.2* 10.0*  HCT 35.2* 28.8*  PLT 168 187   Recent Labs  Lab 02/24/21 2015 02/25/21 0204  NA 138 136  K 3.9 3.9  CL 101 104  CO2 26 20*  BUN 32* 27*  CREATININE 2.26* 2.25*  CALCIUM 9.6 9.0  PROT 7.2  --   BILITOT 0.9  --   ALKPHOS 61  --   ALT 10  --   AST 15  --   GLUCOSE 106* 94     Imaging/Diagnostic Tests: CT PELVIS WO CONTRAST  Result Date: 02/24/2021 CLINICAL DATA:  Abscess, anal or rectal bilateral buttocks abscesses. No IV contrast due to low GFR. EXAM: CT PELVIS WITHOUT CONTRAST TECHNIQUE: Multidetector CT imaging of the pelvis was performed following the standard protocol without intravenous contrast. COMPARISON:  09/06/2019 FINDINGS: Urinary Tract: Bladder wall is mildly thickened, possibly cystitis. Pelvic kidney, likely a transplant. No hydronephrosis or hydroureter. Bowel: Visualized portions of large and small bowel are not abnormally distended. No wall thickening or inflammatory changes. Appendix appears normal. Vascular/Lymphatic: Mild calcification in the aorta and iliac arteries. No aneurysm. Reproductive: Prostate gland is not enlarged. Visualized scrotal contents are unremarkable. Other: No free air or free fluid in the abdomen. Abdominal wall musculature appears intact. Perineal collection in the subcutaneous soft tissues to the right of the gluteal crease, measuring 2.4 cm diameter. Surrounding infiltration in the subcutaneous fat. Changes are consistent with soft tissue abscess. No fistulas identified. There are 2 additional collections in the subcutaneous fat superficial to the inferior left gluteal muscles. One of the collections  posterior to the ischium measures about 1.4 cm and a lateral collection measures about 8 mm in diameter. Skin thickening and stranding in the subcutaneous fat consistent with cellulitis. No fistulas identified. Musculoskeletal: Bones appear intact. No cortical destruction or erosion to suggest osteomyelitis. IMPRESSION: 1. Subcutaneous soft tissue collections in the peroneal fat to the right of the gluteal crease and over the distal left gluteal muscles with surrounding stranding. Changes are consistent with soft tissue abscesses and cellulitis. No fistulas identified. 2. Left transplant pelvic kidney without hydronephrosis. 3. Bladder wall is mildly thickened, possibly cystitis. Electronically Signed   By: Lucienne Capers M.D.   On: 02/24/2021 21:27   DG CHEST PORT 1 VIEW  Result Date: 02/25/2021 CLINICAL DATA:  Chills and low-grade fever. EXAM: PORTABLE CHEST 1 VIEW COMPARISON:  01/03/2020 FINDINGS: Heart size and mediastinal contours are normal. No pleural effusion or edema. Asymmetric airspace opacities noted within the periphery of the right lung base. Left lung clear. Thoracolumbar scoliosis. IMPRESSION: Right base airspace opacity compatible with pneumonia. Electronically Signed   By: Kerby Moors M.D.   On: 02/25/2021 06:32     Orvis Brill, DO 02/25/2021, 9:29 AM PGY-1, [place] Intern pager: [phone], text pages welcome

## 2021-02-25 NOTE — ED Notes (Signed)
Called [facility] to transport patient to [facility] room 31

## 2021-02-25 NOTE — Progress Notes (Signed)
   02/25/21 0251  Assess: MEWS Score  Temp (!) 102.6 F (39.2 C)  BP (!) 144/65  Pulse Rate (!) 103  ECG Heart Rate (!) 103  Resp (!) 26  SpO2 97 %  O2 Device Room Air  Patient Activity (if Appropriate) In bed  Assess: MEWS Score  MEWS Temp 2  MEWS Systolic 0  MEWS Pulse 1  MEWS RR 2  MEWS LOC 0  MEWS Score 5  MEWS Score Color Red  Assess: if the MEWS score is Yellow or Red  Were vital signs taken at a resting state? Yes  Focused Assessment No change from prior assessment  Early Detection of Sepsis Score *See Row Information* High  MEWS guidelines implemented *See Row Information* Yes  Treat  MEWS Interventions Administered prn meds/treatments;Escalated (See documentation below)  Take Vital Signs  Increase Vital Sign Frequency  Red: Q 1hr X 4 then Q 4hr X 4, if remains red, continue Q 4hrs  Escalate  MEWS: Escalate Red: discuss with charge nurse/RN and provider, consider discussing with RRT  Notify: Charge Nurse/RN  Name of Charge Nurse/RN Notified Wenda Overland, RN  Date Charge Nurse/RN Notified 02/25/21  Time Charge Nurse/RN Notified 0251  Notify: Provider  Provider Name/Title Ronnald Ramp, MD  Date Provider Notified 02/25/21  Time Provider Notified [phone]  Notification Type Page  Notification Reason Change in status;Critical result  Provider response See new orders  Date of Provider Response 02/25/21  Time of Provider Response 0304 Susa Simmonds, MD called this RN.)  Notify: Rapid Response  Name of Rapid Response RN Notified Mindy, RN  Date Rapid Response Notified 02/25/21  Time Rapid Response Notified 0346  Document  Patient Outcome Stabilized after interventions   Brimage, MD also notified of critical Lactic Acid of 2.3 at same time. Tylenol 1000mg  given as ordered. NS IV boluses complete as ordered. MEWS now green with HR 80s-90s, Resp 20-23, and Temp 99.9. Will continue to monitor.

## 2021-02-25 NOTE — H&P (Signed)
[facility] Admission History and Physical Service Pager: [phone]  Patient name: Tyler Alvarez Medical record number: [id number] Date of birth: 04-Apr-1976 Age: 45 y.o. Gender: male  Primary Care Provider: Donney Dice, DO Consultants: nephrology [facility], [facility]  Code Status: FULL Preferred Emergency Contact: Manfred Arch, mom, [phone]  Chief Complaint: buttock pain  Assessment and Plan: Tyler Alvarez is a 45 y.o. male admitted for buttock abscess. PMH is significant for Tuberal Sclerosis, renal transplant (Prograft), HTN, ADHD, Aortic aneurysm, RLS and GERD.   Sepsis  Cellulitis  Pt is immunocompromised and febrile who presented from [facility] ED with normal initial lactic acid and leukocytosis with left shift. WBC 16.2. Tmax 102.6 F. Per ED physician, likely source is pt's buttock abscess. Pt had bedside I&D in the ED. Wound culture obtained. Pt given Vancomycin and Cefepime. Received page from RN paged to let team know he remains febrile and LA up-trended to > 2. Pt initially hypertensive but recent BP 90s/60s. Pt received 30 mg/kg fluid bolus. Has had cough for the past 2-3 weeks. Denies dysuria. No CXR or UA obtained in ED.  - Admit to progressive, attending Dr. Ardelia Mems  - follow up CXR - follow up urinalysis, urine culture  - follow up blood culture, wound culture  - AM Lactic acid  - AM CBC/diff  - vitals per unit routine - renal diet   Hx of Renal Transplant  Immunosuppression  CKD  Pt received renal transplant from his older brother 21 years ago. Renal transplant Oct 2001 due to tuberous sclerosis. Takes Tacrolimus 2 mg and prednisone 5 mg. Baseline Cr ~2. SCr on admission 2.26.  - Monitor renal function s/p IVF - follow up UA - renal diet  - renal function panel  - Notify  Kidney of pt's admission   Hypertension  BP on admission 150/78. Home medication is Norvasc 5 mg daily.  - hold until med-rec  complete   GERD  Takes pepcid 20 mg PRN.  - restart when pharmacy team completed med-rec  Aortic Aneurysm  States he has a mild aneurysm in his left chest area. He is unsure of the exact area. States his mom would know. Pt with incident finding of ascending aortic aneurysm. Follows with [facility] up with cardiothoracic surgery.  -gather more information from mom  -follow up with CVTS outpatient   Restless Leg Syndrome  Takes Gabapentin at bedtime.  - restart when pharmacy team completed med-rec   Epilepsy  Tuberous Sclerosis Takes Depakote 250 mg once or twice a day.  - restart when pharmacy team completed med-rec   ADHD Takes imipramine. -restart when pharmacy team completes med-rec   FEN/GI: renal diet without fluid restriction, replete electrolytes as needed  Prophylaxis: Lovenox   Disposition: Admit to progressive  History of Present Illness:  Tyler Alvarez is a 45 y.o. male presented to ED for buttock pain found to have abscess.   Paient states he has several hard "balls" on his "bum" for the past month or so. Reports similar sx previously but not this bad.  Does not having to get antibiotics for this in the past. Tried hot compresses on his lesions. Has had no drainage from the lesions. Pt started having fever in the last day or two. Chills are new today.  Endorses dizziness and nausea on Tuesday. No vomiting. He is afraid to pass stool due to his bumps. Has had dry cough for the past few days.   Pt admitted  from [facility] ED, initial lactic acid <2. I&D performed by ED provided. Vancomycin and Cefepime started in ED for concern for sepsis as pt is immunocompromised state.   Review Of Systems: Per HPI with the following additions:   Review of Systems  Constitutional:  Positive for chills and fever.  HENT:  Negative for sore throat.   Respiratory:  Positive for cough. Negative for shortness of breath.   Cardiovascular:  Negative for chest pain.  Gastrointestinal:   Positive for constipation and nausea. Negative for diarrhea and vomiting.  Genitourinary:  Negative for dysuria.  Musculoskeletal:  Negative for back pain.  Skin:        Lesions on his buttocks   Neurological:  Positive for dizziness.  All other systems reviewed and are negative.  Patient Active Problem List   Diagnosis Date Noted   Cellulitis 02/24/2021   Right foot pain 06/16/2020   Need for immunization against influenza 01/20/2020   Anemia 01/20/2020   Electrolyte abnormality 01/20/2020   Sepsis without acute organ dysfunction (HCC)    Gastroesophageal reflux disease    Community acquired pneumonia 01/03/2020   Neck pain 09/18/2019   Acute-on-chronic kidney injury ([place]) 09/06/2019   Gout 10/09/2017   CKD (chronic kidney disease), stage III ([place]) 10/09/2017   Normocytic anemia    Renal transplant, status post    Essential hypertension    Aneurysm ([place]) 07/16/2014   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[place]) 04/05/2012   HEARING LOSS, LEFT EAR 11/25/2008    Past Medical History: Past Medical History:  Diagnosis Date   ADD (attention deficit disorder)    Anxiety    Benign skin lesion of neck    left neck cyst   Chronic kidney disease    s/p transplant 2001   Family history of adverse reaction to anesthesia    Pt mother stated Father-"I think that he stopped breathing, they called a code but he came through okay"   GERD (gastroesophageal reflux disease)    Headache    Migraines   Heart murmur    Hypertension    Immune deficiency disorder ([place])    Restless leg syndrome    Seizures ([place])    treated by Dr. 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[place])    Wears glasses     Past Surgical History: Past Surgical History:  Procedure Laterality Date   AV FISTULA PLACEMENT     KIDNEY TRANSPLANT  2001   MASS EXCISION Left 12/11/2016   Procedure: EXCISION OF BENIGN LESION OF THE NECK WITH LAYERED CLOSURE;  Surgeon: Irene Limbo, MD;  Location: [facility];  Service:  Plastics;  Laterality: Left;   MASS EXCISION     neck x2   MASS EXCISION Bilateral 04/01/2018   Procedure: excision benign lesions left neck and right and left cheek ( 4cm, 2cm, 1cm x3), layered closure neck <10cm, layered closure right cheek 1cm;  Surgeon: Irene Limbo, MD;  Location: [facility];  Service: Plastics;  Laterality: Bilateral;  left cheek lesion   MULTIPLE TOOTH EXTRACTIONS     NEPHRECTOMY     both removed in 1999, 3 months apart   REVISON OF ARTERIOVENOUS FISTULA Left 07/16/2014   Procedure: EXCISION ANEURYSMAL AREA OF LEFT ARTERIOVENOUS FISTULA;  Surgeon: Serafina Mitchell, MD;  Location: [facility];  Service: Vascular;  Laterality: Left;    Social History: Social History   Tobacco Use   Smoking status: Never   Smokeless tobacco: Never  Vaping Use   Vaping Use: Never  used  Substance Use Topics   Alcohol use: No   Drug use: No   Additional social history: Denies alcohol use, smoker, illicit drug use.   Please also refer to relevant sections of EMR.  Family History: Family History  Problem Relation Age of Onset   Hypothyroidism Mother    Cancer Mother 72       cervical   Hypertension Mother    COPD Mother    COPD Father 35       COPD   Ulcers Father    COPD Brother    Heart disease Brother    Cancer Other      Allergies and Medications: Allergies  Allergen Reactions   Erythromycin Nausea And Vomiting   Sulfa Antibiotics Itching and Nausea And Vomiting   No current facility-administered medications on file prior to encounter.   Current Outpatient Medications on File Prior to Encounter  Medication Sig Dispense Refill   acetaminophen (TYLENOL) 500 MG tablet Take 500 mg by mouth daily as needed for mild pain or headache.     amLODipine (NORVASC) 5 MG tablet Take 1 tablet (5 mg total) by mouth daily. 30 tablet 2   aspirin EC 81 MG tablet Take 81 mg by mouth daily.     brompheniramine-pseudoephedrine (DIMETAPP) 1-15 MG/5ML ELIX Take 20 mLs by mouth daily as  needed for allergies.     dextromethorphan-guaiFENesin (MUCINEX DM) 30-600 MG 12hr tablet Take 1 tablet by mouth 2 (two) times daily. 20 tablet 0   diclofenac Sodium (VOLTAREN) 1 % GEL APPLY 4 G TOPICALLY 2 (TWO) TIMES DAILY AS NEEDED. (Patient not taking: Reported on 01/04/2020) 200 g 3   divalproex (DEPAKOTE) 250 MG DR tablet Take 1 tablet (250 mg total) by mouth 2 (two) times daily. Only takes 1 per day 180 tablet 3   gabapentin (NEURONTIN) 300 MG capsule TAKE 1 CAPSULE BY MOUTH EVERYDAY AT BEDTIME 90 capsule 0   imipramine (TOFRANIL) 25 MG tablet Take 50 mg by mouth at bedtime.      Ipratropium-Albuterol (COMBIVENT) 20-100 MCG/ACT AERS respimat Inhale 1 puff into the lungs every 6 (six) hours as needed for wheezing or shortness of breath. 4 g 1   omeprazole (PRILOSEC) 20 MG capsule Take 20 mg by mouth daily. (Patient not taking: Reported on 01/04/2020)     predniSONE (DELTASONE) 5 MG tablet Take 5 mg by mouth daily.      tacrolimus (PROGRAF) 1 MG capsule Take 2 mg by mouth 2 (two) times daily.       Objective: BP (!) 143/83 (BP Location: Right Wrist)   Pulse (!) 105   Temp 100.3 F (37.9 C) (Oral)   Resp (!) 25   Ht 6' (1.829 m)   Wt 57.4 kg   SpO2 98%   BMI 17.16 kg/m  Exam: General: chronically ill appearing man, syndromic facies  Eyes: no scleral icterus, reactive to light  ENTM: enlarged jaw, moist mucus membranes, no nasal discharge  Neck: normal ROM Cardiovascular: regular rate and rhythm, systolic murmur noted  Respiratory: clear to ascultation bilaterally, no increased work of breathing  Gastrointestinal: soft, non-tender, non-distended, no palpable masses  MSK: thin extremities  Neuro: alert and oriented, no abnormal coordination, ?cognitive delay  Psych: normal thought content, normal affect  Derm: multiple angiofibromas, see buttock lesions below  Buttock abscess s/p I&D         Labs and Imaging: CBC BMET  Recent Labs  Lab 02/24/21 2015  WBC 16.2*  HGB  12.2*  HCT 35.2*  PLT 168   Recent Labs  Lab 02/24/21 2015  NA 138  K 3.9  CL 101  CO2 26  BUN 32*  CREATININE 2.26*  GLUCOSE 106*  CALCIUM 9.6     EKG: HR 97, NSR, no acute ST or T wave changes   CT PELVIS WO CONTRAST  Result Date: 02/24/2021 CLINICAL DATA:  Abscess, anal or rectal bilateral buttocks abscesses. No IV contrast due to low GFR. EXAM: CT PELVIS WITHOUT CONTRAST TECHNIQUE: Multidetector CT imaging of the pelvis was performed following the standard protocol without intravenous contrast. COMPARISON:  09/06/2019 FINDINGS: Urinary Tract: Bladder wall is mildly thickened, possibly cystitis. Pelvic kidney, likely a transplant. No hydronephrosis or hydroureter. Bowel: Visualized portions of large and small bowel are not abnormally distended. No wall thickening or inflammatory changes. Appendix appears normal. Vascular/Lymphatic: Mild calcification in the aorta and iliac arteries. No aneurysm. Reproductive: Prostate gland is not enlarged. Visualized scrotal contents are unremarkable. Other: No free air or free fluid in the abdomen. Abdominal wall musculature appears intact. Perineal collection in the subcutaneous soft tissues to the right of the gluteal crease, measuring 2.4 cm diameter. Surrounding infiltration in the subcutaneous fat. Changes are consistent with soft tissue abscess. No fistulas identified. There are 2 additional collections in the subcutaneous fat superficial to the inferior left gluteal muscles. One of the collections posterior to the ischium measures about 1.4 cm and a lateral collection measures about 8 mm in diameter. Skin thickening and stranding in the subcutaneous fat consistent with cellulitis. No fistulas identified. Musculoskeletal: Bones appear intact. No cortical destruction or erosion to suggest osteomyelitis. IMPRESSION: 1. Subcutaneous soft tissue collections in the peroneal fat to the right of the gluteal crease and over the distal left gluteal muscles  with surrounding stranding. Changes are consistent with soft tissue abscesses and cellulitis. No fistulas identified. 2. Left transplant pelvic kidney without hydronephrosis. 3. Bladder wall is mildly thickened, possibly cystitis. Electronically Signed   By: Lucienne Capers M.D.   On: 02/24/2021 21:27     Lyndee Hensen, DO 02/25/2021, 3:58 AM PGY-3, [place] Intern pager: [phone], text pages welcome

## 2021-02-26 DIAGNOSIS — L0231 Cutaneous abscess of buttock: Secondary | ICD-10-CM | POA: Diagnosis not present

## 2021-02-26 DIAGNOSIS — L03317 Cellulitis of buttock: Secondary | ICD-10-CM | POA: Diagnosis not present

## 2021-02-26 LAB — RENAL FUNCTION PANEL
Albumin: 2.7 g/dL — ABNORMAL LOW (ref 3.5–5.0)
Anion gap: 8 (ref 5–15)
BUN: 20 mg/dL (ref 6–20)
CO2: 19 mmol/L — ABNORMAL LOW (ref 22–32)
Calcium: 8.4 mg/dL — ABNORMAL LOW (ref 8.9–10.3)
Chloride: 110 mmol/L (ref 98–111)
Creatinine, Ser: 1.92 mg/dL — ABNORMAL HIGH (ref 0.61–1.24)
GFR, Estimated: 43 mL/min — ABNORMAL LOW (ref >60)
Glucose, Bld: 94 mg/dL (ref 70–99)
Phosphorus: 1.9 mg/dL — ABNORMAL LOW (ref 2.5–4.6)
Potassium: 4 mmol/L (ref 3.5–5.1)
Sodium: 137 mmol/L (ref 135–145)

## 2021-02-26 LAB — CBC WITH DIFFERENTIAL/PLATELET
Abs Immature Granulocytes: 0.07 10*3/uL (ref 0.00–0.07)
Basophils Absolute: 0.1 10*3/uL (ref 0.0–0.1)
Basophils Relative: 1 %
Eosinophils Absolute: 0.6 10*3/uL — ABNORMAL HIGH (ref 0.0–0.5)
Eosinophils Relative: 4 %
HCT: 27.4 % — ABNORMAL LOW (ref 39.0–52.0)
Hemoglobin: 9.5 g/dL — ABNORMAL LOW (ref 13.0–17.0)
Immature Granulocytes: 1 %
Lymphocytes Relative: 15 %
Lymphs Abs: 2 10*3/uL (ref 0.7–4.0)
MCH: 31 pg (ref 26.0–34.0)
MCHC: 34.7 g/dL (ref 30.0–36.0)
MCV: 89.5 fL (ref 80.0–100.0)
Monocytes Absolute: 1 10*3/uL (ref 0.1–1.0)
Monocytes Relative: 8 %
Neutro Abs: 9.5 10*3/uL — ABNORMAL HIGH (ref 1.7–7.7)
Neutrophils Relative %: 71 %
Platelets: 194 10*3/uL (ref 150–400)
RBC: 3.06 MIL/uL — ABNORMAL LOW (ref 4.22–5.81)
RDW: 12.8 % (ref 11.5–15.5)
WBC: 13.2 10*3/uL — ABNORMAL HIGH (ref 4.0–10.5)
nRBC: 0 % (ref 0.0–0.2)

## 2021-02-26 MED ORDER — AMLODIPINE BESYLATE 5 MG PO TABS
5.0000 mg | ORAL_TABLET | Freq: Every day | ORAL | Status: DC
  Administered 2021-02-26 – 2021-02-28 (×3): 5 mg via ORAL
  Filled 2021-02-26 (×3): qty 1

## 2021-02-26 MED ORDER — PROMETHAZINE HCL 25 MG PO TABS
12.5000 mg | ORAL_TABLET | Freq: Once | ORAL | Status: AC | PRN
  Administered 2021-02-26: 12.5 mg via ORAL
  Filled 2021-02-26: qty 1

## 2021-02-26 NOTE — Progress Notes (Signed)
FPTS Brief Progress Note  S:Paged by RN that pt having nausea. Pt reports he is nauseous. Requests medication. Denies chest pain and shortness of breath. No vomiting.    O: BP 127/76 (BP Location: Left Arm)   Pulse 94   Temp 99.7 F (37.6 C) (Oral)   Resp (!) 21   Ht 6' (1.829 m)   Wt 57.4 kg   SpO2 96%   BMI 17.16 kg/m    GEN: resting in bed with washcloth over forehead  RESP: no increased work of breathing, no respiratory distress  A/P: Nausea: Phenergan po x1 given.  - Orders reviewed. Labs for AM ordered, which was adjusted as needed.    Lyndee Hensen, DO 02/26/2021, 1:31 AM PGY-3, [place] [facility] Night Resident  Please page [phone] with questions.

## 2021-02-26 NOTE — Progress Notes (Signed)
[facility] NEPHROLOGY PROGRESS NOTE  Assessment/ Plan: Pt is a 45 y.o. yo male  with a past medical history of tuberous sclerosis angiomyolipoma, ESRD status post living unrelated kidney transplant in 02/2000 at [facility], post transplant CKD stage III with baseline serum creatinine level around 2, hypertension, ADHD, acid reflux who was admitted with multiple boils on his bottoms, fever, seen as a consultation because of kidney transplant status  #Kidney transplant status on chronic immunosuppression: Creatinine level is around his baseline.  UA with minimal chronic protein without hematuria.  The abdomen exam is benign and not tender after passing bowel.  The kidney function is at his baseline therefore I recommend continuing Prograf and prednisone at current dose.  The kidney transplant was done more than 20 years ago from his brother.  Given clinical improvement, there is no need to do renal ultrasound, I will discontinue the order.  He has good oral intake therefore no need for IV fluid.  Follow-up with Dr. Joelyn Oms after discharge from the hospital.   #Chronic kidney disease stage IIIb in post transplant kidney: The serum creatinine level seems to be around baseline.  Mild fluctuation may be due to sepsis/infection.  He is nonoliguric.  Avoid NSAIDs, IV contrast.     #Sepsis due to buttock abscess/cellulitis: Status post I&D in ER.  Currently on broad-spectrum antibiotics.  Recommend to check Vanco level.  Follow-up culture results.   #Hypertension: May resume amlodipine for hypertension.  Monitor BP.    Sign off, please call back with question.  Subjective: Seen and examined at bedside.  Patient reports feeling good except pain around the buttock.  Reports that he had a bowel movement yesterday.  He denies any nausea, vomiting, chest pain, shortness of breath or abdominal pain. Objective Vital signs in last 24 hours: Vitals:   02/26/21 0000 02/26/21 0330 02/26/21 0639 02/26/21  0802  BP: 127/76 (!) 176/72  (!) 144/81  Pulse: 94 95 93 96  Resp: (!) 21 17 (!) 21 20  Temp: 99.7 F (37.6 C) 99.2 F (37.3 C)  99.2 F (37.3 C)  TempSrc: Oral Oral  Oral  SpO2: 96% 96% 98% 97%  Weight:   59.8 kg   Height:       Weight change: 2.193 kg  Intake/Output Summary (Last 24 hours) at 02/26/2021 1037 Last data filed at 02/25/2021 2126 Gross per 24 hour  Intake 881.21 ml  Output 575 ml  Net 306.21 ml       Labs: Basic Metabolic Panel: Recent Labs  Lab 02/24/21 2015 02/25/21 0204 02/26/21 0223  NA 138 136 137  K 3.9 3.9 4.0  CL 101 104 110  CO2 26 20* 19*  GLUCOSE 106* 94 94  BUN 32* 27* 20  CREATININE 2.26* 2.25* 1.92*  CALCIUM 9.6 9.0 8.4*  PHOS  --  2.4* 1.9*   Liver Function Tests: Recent Labs  Lab 02/24/21 2015 02/25/21 0204 02/26/21 0223  AST 15  --   --   ALT 10  --   --   ALKPHOS 61  --   --   BILITOT 0.9  --   --   PROT 7.2  --   --   ALBUMIN 4.1 3.4* 2.7*   No results for input(s): LIPASE, AMYLASE in the last 168 hours. No results for input(s): AMMONIA in the last 168 hours. CBC: Recent Labs  Lab 02/24/21 2015 02/25/21 0535 02/26/21 0223  WBC 16.2* 15.2* 13.2*  NEUTROABS 13.0* 11.9* 9.5*  HGB 12.2*  10.0* 9.5*  HCT 35.2* 28.8* 27.4*  MCV 88.2 88.9 89.5  PLT 168 187 194   Cardiac Enzymes: No results for input(s): CKTOTAL, CKMB, CKMBINDEX, TROPONINI in the last 168 hours. CBG: No results for input(s): GLUCAP in the last 168 hours.  Iron Studies: No results for input(s): IRON, TIBC, TRANSFERRIN, FERRITIN in the last 72 hours. Studies/Results: CT PELVIS WO CONTRAST  Result Date: 02/24/2021 CLINICAL DATA:  Abscess, anal or rectal bilateral buttocks abscesses. No IV contrast due to low GFR. EXAM: CT PELVIS WITHOUT CONTRAST TECHNIQUE: Multidetector CT imaging of the pelvis was performed following the standard protocol without intravenous contrast. COMPARISON:  09/06/2019 FINDINGS: Urinary Tract: Bladder wall is mildly  thickened, possibly cystitis. Pelvic kidney, likely a transplant. No hydronephrosis or hydroureter. Bowel: Visualized portions of large and small bowel are not abnormally distended. No wall thickening or inflammatory changes. Appendix appears normal. Vascular/Lymphatic: Mild calcification in the aorta and iliac arteries. No aneurysm. Reproductive: Prostate gland is not enlarged. Visualized scrotal contents are unremarkable. Other: No free air or free fluid in the abdomen. Abdominal wall musculature appears intact. Perineal collection in the subcutaneous soft tissues to the right of the gluteal crease, measuring 2.4 cm diameter. Surrounding infiltration in the subcutaneous fat. Changes are consistent with soft tissue abscess. No fistulas identified. There are 2 additional collections in the subcutaneous fat superficial to the inferior left gluteal muscles. One of the collections posterior to the ischium measures about 1.4 cm and a lateral collection measures about 8 mm in diameter. Skin thickening and stranding in the subcutaneous fat consistent with cellulitis. No fistulas identified. Musculoskeletal: Bones appear intact. No cortical destruction or erosion to suggest osteomyelitis. IMPRESSION: 1. Subcutaneous soft tissue collections in the peroneal fat to the right of the gluteal crease and over the distal left gluteal muscles with surrounding stranding. Changes are consistent with soft tissue abscesses and cellulitis. No fistulas identified. 2. Left transplant pelvic kidney without hydronephrosis. 3. Bladder wall is mildly thickened, possibly cystitis. Electronically Signed   By: Lucienne Capers M.D.   On: 02/24/2021 21:27   DG CHEST PORT 1 VIEW  Result Date: 02/25/2021 CLINICAL DATA:  Chills and low-grade fever. EXAM: PORTABLE CHEST 1 VIEW COMPARISON:  01/03/2020 FINDINGS: Heart size and mediastinal contours are normal. No pleural effusion or edema. Asymmetric airspace opacities noted within the periphery of  the right lung base. Left lung clear. Thoracolumbar scoliosis. IMPRESSION: Right base airspace opacity compatible with pneumonia. Electronically Signed   By: Kerby Moors M.D.   On: 02/25/2021 06:32    Medications: Infusions:  ceFEPime (MAXIPIME) IV 2 g (02/26/21 1006)   vancomycin 1,250 mg (02/26/21 0819)    Scheduled Medications:  divalproex  250 mg Oral QHS   enoxaparin (LOVENOX) injection  40 mg Subcutaneous Q24H   gabapentin  300 mg Oral QHS   imipramine  50 mg Oral QHS   influenza vac split quadrivalent PF  0.5 mL Intramuscular Tomorrow-1000   pneumococcal 23 valent vaccine  0.5 mL Intramuscular Tomorrow-1000   predniSONE  5 mg Oral Daily   promethazine  12.5 mg Oral Once   tacrolimus  2 mg Oral BID    have reviewed scheduled and prn medications.  Physical Exam: General:NAD, comfortable Heart:RRR, s1s2 nl Lungs:clear b/l, no crackle Abdomen:soft, No allograft tenderness, non-distended Extremities:No edema Neurology: Alert, awake and following commands  Shaka Zech Tanna Furry 02/26/2021,10:37 AM  LOS: 1 day

## 2021-02-26 NOTE — Consult Note (Signed)
[facility] Nurse Consult Note: Patient receiving care in [facility] [phone] Consult completed remotely after review of chart and images Reason for Consult: Left and right buttock wounds Wound type: abscesses, 1 over the left buttocks and 1 over the right gluteal cleft that were drained at [facility] before being transported to [facility].  Pressure Injury POA: NA Dressing procedure/placement/frequency: Insert 1/2" iodoform gauze Kellie Simmering # [id number]) into the abscesses, 1 over the left buttocks and 1 over the right gluteal cleft leaving a tail for easy removal. Secure with foam dressing. Carefully pull back foam dressing twice daily and replace the iodoform.   PHOTOS TAKEN FROM MEDIA. NOT TAKEN BY WOC TEAM.      Monitor the wound area(s) for worsening of condition such as: Signs/symptoms of infection, increase in size, development of or worsening of odor, development of pain, or increased pain at the affected locations.   Notify the medical team if any of these develop.  Thank you for the consult. [place] nurse will not follow at this time.   Please re-consult the [place] team if needed.  Cathlean Marseilles Tamala Julian, MSN, RN, [place], Lysle Pearl, [facility] Wound Treatment Associate Pager [phone]

## 2021-02-26 NOTE — Progress Notes (Signed)
Phenergan not given. Pt had the c/o nausea overnight. Attempting to give patient phenergan OTO for nausea. Pt was sound asleep as I entered room. Pt has had no more c/o of nausea. Will continue to monitor.

## 2021-02-26 NOTE — Progress Notes (Signed)
Family Medicine Teaching Service Daily Progress Note Intern Pager: [phone]  Patient name: Tyler Alvarez Medical record number: [id number] Date of birth: Nov 20, 1975 Age: 45 y.o. Gender: male  Primary Care Provider: Donney Dice, DO Consultants: Nephro Code Status: FULL  Pt Overview and Major Events to Date:  10/14: admitted, nephro consulted  Assessment and Plan:  Tyler Alvarez is a 45 y.o. male admitted for buttock abscess. PMH significant for tuberous sclerosis, s/p renal transplant on Prograft and chronic prednisone, aortic aneurysm, HTN, ADHD, RLS, and GERD.  Buttock Abscess S/p bedside I&D in the ED. Remains afebrile, WBC downtrending (15.2>13.2). Gram stain showing moderate gram positive cocci and gram negative rods-- culture results pending. Blood cx no growth <24hrs. -Continue IV vanc and cefepime -f/u culture results -Tylenol 1000mg  q6h prn for fever, pain  CKD Stage IIIb s/p Renal Transplant  Chronic Immunosuppression Kidney function at baseline this morning (Cr 1.92). Denies any pain at transplant site. -Continue home Prograf 2mg  BID and prednisone 5mg  daily -Renal transplant ultrasound pending -Nephro to sign off pending ultrasound results -Monitor BMP -Avoid nephrotoxic agents  HTN Home amlodipine held initially due to normal range BP and concern for possible sepsis. -Resume home amlodipine 5mg  daily -Monitor BP  Tuberous Sclerosis  Epilepsy Chronic, stable -Continue home Depakote 250mg  daily   Remainder of medical problems (GERD, RLS, ADHD) are chronic and stable-- patient continued on home medications.   FEN/GI: Renal diet PPx: Lovenox Dispo:Home  in 1-2 days . Barriers include ongoing IV antibiotics, awaiting culture results.   Subjective:  No acute events overnight. Patient reports he feels much better than when he initially presented. Buttock pain is improving. He endorses mild intermittent nausea. Otherwise has no  complaints.  Objective: Temp:  [98.3 F (36.8 C)-100 F (37.8 C)] 99.2 F (37.3 C) (10/15 0802) Pulse Rate:  [92-99] 96 (10/15 0802) Resp:  [16-25] 20 (10/15 0802) BP: (119-176)/(67-84) 144/81 (10/15 0802) SpO2:  [96 %-99 %] 97 % (10/15 0802) Weight:  [59.8 kg] 59.8 kg (10/15 4656) Physical Exam: General: alert, NAD, chronically ill appearing Cardiovascular: RRR, normal S1/S2 Respiratory: normal effort, lungs CTAB Abdomen: soft, nontender Buttocks: dressings in place on bilateral buttocks and sacral area. Clean/dry/intact. Mild to moderate tenderness over L buttock, erythema improved from prior  Laboratory: Recent Labs  Lab 02/24/21 2015 02/25/21 0535 02/26/21 0223  WBC 16.2* 15.2* 13.2*  HGB 12.2* 10.0* 9.5*  HCT 35.2* 28.8* 27.4*  PLT 168 187 194   Recent Labs  Lab 02/24/21 2015 02/25/21 0204 02/26/21 0223  NA 138 136 137  K 3.9 3.9 4.0  CL 101 104 110  CO2 26 20* 19*  BUN 32* 27* 20  CREATININE 2.26* 2.25* 1.92*  CALCIUM 9.6 9.0 8.4*  PROT 7.2  --   --   BILITOT 0.9  --   --   ALKPHOS 61  --   --   ALT 10  --   --   AST 15  --   --   GLUCOSE 106* 94 94    Imaging/Diagnostic Tests: No results found.   Alcus Dad, MD 02/26/2021, 8:19 AM PGY-2, [place] Intern pager: [phone], text pages welcome

## 2021-02-27 DIAGNOSIS — L03317 Cellulitis of buttock: Secondary | ICD-10-CM | POA: Diagnosis not present

## 2021-02-27 DIAGNOSIS — L0231 Cutaneous abscess of buttock: Secondary | ICD-10-CM | POA: Diagnosis not present

## 2021-02-27 LAB — AEROBIC CULTURE W GRAM STAIN (SUPERFICIAL SPECIMEN)
Culture: NORMAL
Gram Stain: NONE SEEN

## 2021-02-27 LAB — BASIC METABOLIC PANEL
Anion gap: 9 (ref 5–15)
BUN: 21 mg/dL — ABNORMAL HIGH (ref 6–20)
CO2: 18 mmol/L — ABNORMAL LOW (ref 22–32)
Calcium: 8.8 mg/dL — ABNORMAL LOW (ref 8.9–10.3)
Chloride: 111 mmol/L (ref 98–111)
Creatinine, Ser: 1.91 mg/dL — ABNORMAL HIGH (ref 0.61–1.24)
GFR, Estimated: 43 mL/min — ABNORMAL LOW (ref >60)
Glucose, Bld: 115 mg/dL — ABNORMAL HIGH (ref 70–99)
Potassium: 3.8 mmol/L (ref 3.5–5.1)
Sodium: 138 mmol/L (ref 135–145)

## 2021-02-27 LAB — CBC
HCT: 30.5 % — ABNORMAL LOW (ref 39.0–52.0)
Hemoglobin: 10.4 g/dL — ABNORMAL LOW (ref 13.0–17.0)
MCH: 30.7 pg (ref 26.0–34.0)
MCHC: 34.1 g/dL (ref 30.0–36.0)
MCV: 90 fL (ref 80.0–100.0)
Platelets: 234 10*3/uL (ref 150–400)
RBC: 3.39 MIL/uL — ABNORMAL LOW (ref 4.22–5.81)
RDW: 12.8 % (ref 11.5–15.5)
WBC: 12.3 10*3/uL — ABNORMAL HIGH (ref 4.0–10.5)
nRBC: 0 % (ref 0.0–0.2)

## 2021-02-27 MED ORDER — PROMETHAZINE HCL 25 MG PO TABS
12.5000 mg | ORAL_TABLET | Freq: Once | ORAL | Status: AC | PRN
  Administered 2021-02-27: 12.5 mg via ORAL
  Filled 2021-02-27: qty 1

## 2021-02-27 MED ORDER — GABAPENTIN 300 MG PO CAPS
300.0000 mg | ORAL_CAPSULE | Freq: Every day | ORAL | Status: DC
  Administered 2021-02-27 – 2021-02-28 (×2): 300 mg via ORAL
  Filled 2021-02-27: qty 1

## 2021-02-27 NOTE — Progress Notes (Signed)
FPTS Brief Progress Note  S: Checked in with RN.  Just given Phenergan 1 hour ago for nausea.  No other concerns.   O: BP (!) 142/83 (BP Location: Right Wrist)   Pulse 96   Temp 98.3 F (36.8 C) (Axillary)   Resp 20   Ht 6' (1.829 m)   Wt 59.8 kg   SpO2 94%   BMI 17.88 kg/m     A/P:  - Orders reviewed. Labs for AM ordered, which was adjusted as needed.  - vitals de-escalated to q4h   Zola Button, MD 02/27/2021, 12:40 AM PGY-2, [place] [facility] Night Resident  Please page [phone] with questions.

## 2021-02-27 NOTE — Progress Notes (Signed)
Pharmacy Antibiotic Note  Tyler Alvarez is a 45 y.o. male admitted on 02/24/2021 with sepsis.  Pharmacy has been consulted for vancomycin and cefepime dosing.  Patient with hx of kidney transplant presents with concern for abscess. SCr 2.23>1.91 (BL 1.8-2).   Plan to switch to oral antibiotics on 10/17  Plan:  Continue Vancomycin 1250 mg IV q48 hours  Continue Cefepime 2 g IV q12 hours Monitor cultures, labs, vancomycin levels if continues, and clinical improvement as appropriate  Height: 6' (182.9 cm) Weight: 59.8 kg (131 lb 13.4 oz) IBW/kg (Calculated) : 77.6  Temp (24hrs), Avg:98.5 F (36.9 C), Min:98 F (36.7 C), Max:99 F (37.2 C)  Recent Labs  Lab 02/24/21 2015 02/25/21 0204 02/25/21 0226 02/25/21 0535 02/26/21 0223 02/27/21 0218  WBC 16.2*  --   --  15.2* 13.2* 12.3*  CREATININE 2.26* 2.25*  --   --  1.92* 1.91*  LATICACIDVEN 1.3  --  2.3* 0.7  --   --      Estimated Creatinine Clearance: 40.9 mL/min (A) (by C-G formula based on SCr of 1.91 mg/dL (H)).    Allergies  Allergen Reactions   Erythromycin Nausea And Vomiting   Sulfa Antibiotics Itching and Nausea And Vomiting    Antimicrobials this admission: Vancomycin 10/13>> Cefepime 10/13>>  Microbiology results: 10/13 BCx: in process 10/13 Abscess culture - GPCs/GNRs  Thank you for allowing pharmacy to be a part of this patient's care.  Alanda Slim, PharmD, [facility] Clinical Pharmacist Please see AMION for all Pharmacists' Contact Phone Numbers 02/27/2021, 11:14 AM

## 2021-02-27 NOTE — Progress Notes (Addendum)
Family Medicine Teaching Service Daily Progress Note Intern Pager: [phone]  Patient name: Tyler Alvarez Medical record number: [id number] Date of birth: 11-01-1975 Age: 45 y.o. Gender: male  Primary Care Provider: Donney Dice, DO Consultants: Nephrology Code Status: Full  Pt Overview and Major Events to Date:  10/13- Admitted  Anticipate d/c home tomorrow  Assessment and Plan: Tyler Alvarez is a 45 y.o. male admitted for buttock abscess. PMH is significant for Tuberal Sclerosis, renal transplant (Prograft), HTN, ADHD, Aortic aneurysm, RLS and GERD.   Buttock abscess/cellulitis in setting of sepsis Afebrile overnight. Feels well this morning. Still having tolerable buttock pain. Tolerating oral fluids/food well, no need for IV fluids. WBC 12.3 this AM, down from 13.2 yesterday. Blood cultures with no growth at 2 days. Will continue broad spectrum IV antibiotics until gram stain culture returns, and transition to PO tomorrow. Gram stain shows no WBC, moderate gram positive cocci and moderate gram negative rods. Awaiting culture. - Saline lock IV - Continue IV Cefepime and Vancomycin, transition to PO antibiotics tomorrow - Cardiac monitoring - Continuous pulse oximetry - Monitor for fever - Tylenol 1000mg  q6h PRN for fever, pain - AM CBC, CMP  CKD Stage IIIb s/p Renal Transplant  Chronic Immunosuppression Takes Tacrolimus and prednisone at home. Baseline Cr ~2. SCr on admission 2.26, today 1.91.  Dr. Carolin Sicks with nephrology has been following and does not recommend any changes from current management. He does not feel the need for renal US at this time given clinical improvement and renal transplantation almost 20 years ago. Will monitor Vancomycin levels per nephrology request. - Monitor renal function s/p IVF - Continue Tacrolimus 2mg  - Continue Prednisone 5mg  - Avoid NSAIDS, IV contrast - Monitor renal function with daily labs - Nephrology signed off, greatly appreciate  their care for this patient - Follow-up with Dr. Joelyn Oms after discharge   Hypertension  Home medications resumed yesterday. BP mostly normotensive overnight and this morning 110s-140s/70s-80s. - Continue Amlodipine 5mg  daily - Monitor BP   Restless Leg Syndrome  Chronic, stable - Continue Gabapentin 300mg  daily at bedtime   GERD  Chronic, stable.Takes pepcid 20 mg PRN.  - Start home medications   Epilepsy  Tuberous Sclerosis Chronic, no seizures since admission. Takes Depakote 250mg  once daily at home. - Continue Depakote 250mg  daily   ADHD Chronic, stable. Takes imipramine. - Continue Imipramine 50mg  daily at bedtime  FEN/GI: Renal diet PPx: Lovenox Dispo:Home in 1-2 days. Barriers include ongoing IV antibiotics, awaiting culture results.   Subjective:  Tyler Alvarez was pleasant and resting comfortably in bed this morning. He denies nausea, vomiting, SOB, cough, out of control pain. He is drinking well but still having diminished appetite.  Objective: Temp:  [98 F (36.7 C)-99.2 F (37.3 C)] 98 F (36.7 C) (10/16 0431) Pulse Rate:  [86-100] 86 (10/16 0431) Resp:  [15-27] 19 (10/16 0431) BP: (107-166)/(74-97) 166/77 (10/16 0431) SpO2:  [94 %-100 %] 96 % (10/16 0431) Physical Exam: General: Pleasant, resting comfortably, chronically ill appearing Cardiovascular: RRR Respiratory: CTAB in all fields. Good aeration. No wheezing or crackles. Abdomen: Soft, thin, nontender Buttocks: Clean, dry dressings in place on bilateral buttocks  Laboratory: Recent Labs  Lab 02/25/21 0535 02/26/21 0223 02/27/21 0218  WBC 15.2* 13.2* 12.3*  HGB 10.0* 9.5* 10.4*  HCT 28.8* 27.4* 30.5*  PLT 187 194 234   Recent Labs  Lab 02/24/21 2015 02/25/21 0204 02/26/21 0223 02/27/21 0218  NA 138 136 137 138  K 3.9 3.9 4.0 3.8  CL 101 104 110 111  CO2 26 20* 19* 18*  BUN 32* 27* 20 21*  CREATININE 2.26* 2.25* 1.92* 1.91*  CALCIUM 9.6 9.0 8.4* 8.8*  PROT 7.2  --   --   --    BILITOT 0.9  --   --   --   ALKPHOS 61  --   --   --   ALT 10  --   --   --   AST 15  --   --   --   GLUCOSE 106* 94 94 115*    Imaging/Diagnostic Tests: No results found.   Orvis Brill, DO 02/27/2021, 6:59 AM PGY-1, [place] Intern pager: [phone], text pages welcome

## 2021-02-28 ENCOUNTER — Other Ambulatory Visit (HOSPITAL_COMMUNITY): Payer: Self-pay

## 2021-02-28 LAB — CBC
HCT: 28.6 % — ABNORMAL LOW (ref 39.0–52.0)
Hemoglobin: 9.5 g/dL — ABNORMAL LOW (ref 13.0–17.0)
MCH: 30.2 pg (ref 26.0–34.0)
MCHC: 33.2 g/dL (ref 30.0–36.0)
MCV: 90.8 fL (ref 80.0–100.0)
Platelets: 230 10*3/uL (ref 150–400)
RBC: 3.15 MIL/uL — ABNORMAL LOW (ref 4.22–5.81)
RDW: 13 % (ref 11.5–15.5)
WBC: 8.6 10*3/uL (ref 4.0–10.5)
nRBC: 0 % (ref 0.0–0.2)

## 2021-02-28 LAB — BASIC METABOLIC PANEL
Anion gap: 5 (ref 5–15)
BUN: 20 mg/dL (ref 6–20)
CO2: 20 mmol/L — ABNORMAL LOW (ref 22–32)
Calcium: 8.5 mg/dL — ABNORMAL LOW (ref 8.9–10.3)
Chloride: 111 mmol/L (ref 98–111)
Creatinine, Ser: 1.83 mg/dL — ABNORMAL HIGH (ref 0.61–1.24)
GFR, Estimated: 46 mL/min — ABNORMAL LOW (ref >60)
Glucose, Bld: 163 mg/dL — ABNORMAL HIGH (ref 70–99)
Potassium: 3.6 mmol/L (ref 3.5–5.1)
Sodium: 136 mmol/L (ref 135–145)

## 2021-02-28 LAB — GLUCOSE, CAPILLARY: Glucose-Capillary: 185 mg/dL — ABNORMAL HIGH (ref 70–99)

## 2021-02-28 MED ORDER — AMOXICILLIN-POT CLAVULANATE 875-125 MG PO TABS
1.0000 | ORAL_TABLET | Freq: Two times a day (BID) | ORAL | Status: DC
  Administered 2021-02-28: 1 via ORAL
  Filled 2021-02-28: qty 1

## 2021-02-28 MED ORDER — AMOXICILLIN-POT CLAVULANATE 875-125 MG PO TABS
1.0000 | ORAL_TABLET | Freq: Two times a day (BID) | ORAL | 0 refills | Status: AC
  Filled 2021-02-28: qty 10, 5d supply, fill #0

## 2021-02-28 MED ORDER — DOXYCYCLINE HYCLATE 100 MG PO TABS
100.0000 mg | ORAL_TABLET | Freq: Two times a day (BID) | ORAL | 0 refills | Status: AC
  Filled 2021-02-28: qty 8, 4d supply, fill #0

## 2021-02-28 NOTE — Progress Notes (Signed)
Discharge orders received.  IV and telemetry removed, CCMD notified. MD in room to speak with pt and pt's mother regarding discharge clearance.  Pt reluctantly agreeable to discharge home with [facility] transport. As per social work pt's mother can also ride with transport as she got a ride here with family friend.  Home health arranged by [facility] mgt.  Pt and mother provided with supplies and education regarding dressing changes, medications, etc.  TOC pharmacy has delivered new Rxs.

## 2021-02-28 NOTE — Social Work (Signed)
EDCSW received call from Floor RN requesting assistance with d/c transportation. CSW coordinated transportation via [employer]

## 2021-02-28 NOTE — TOC Transition Note (Signed)
Transition of Care [facility]) - CM/SW Discharge Note   Patient Details  Name: Tyler Alvarez MRN: [id number] Date of Birth: 12/28/1975  [facility]) CM/SW Contact:  Ninfa Meeker, RN Phone Number: 02/28/2021, 4:56 PM   Clinical Narrative:  Referral for Home Health called to Adela Lank, [facility] liaison. Patient /mom have no preference. No DME needs. Patient will discharge home via [facility]. RN provided with number- [phone].     Final next level of care: Home w Home Health Services Barriers to Discharge: No Barriers Identified   Patient Goals and CMS Choice     Choice offered to / list presented to : Patient  Discharge Placement                       Discharge Plan and Services In-house Referral: NA Discharge Planning Services: CM Consult Post Acute Care Choice: Home Health          DME Arranged: N/A DME Agency: NA       HH Arranged: PT HH Agency: [facility] Date [facility] Agency Contacted: 02/28/21 Time [place]: [id number]    Social Determinants of Health (SDOH) Interventions     Readmission Risk Interventions No flowsheet data found.

## 2021-02-28 NOTE — Evaluation (Signed)
Physical Therapy Evaluation Patient Details Name: Tyler Alvarez MRN: [id number] DOB: 1975/10/06 Today's Date: 02/28/2021  History of Present Illness  Pt is a 45 y.o. M admitted for buttock abscess/cellulitis in setting of sepsis. Significant PMH: tuberal sclerosis, CKD stage IIIb s/p renal transplant, aortic aneurysm.  Clinical Impression  PTA, pt lives with his mother and is independent. Pt overall moving fairly well; performing transfers independently and ambulating 300 feet with no assistive device at a supervision level. Pt reporting intermittent dizziness/lightheadedness, causing mild imbalance, however BP was stable (133/80). Pt would benefit from a donut pillow to improve positioning and comfort for sitting; RN notified. Do not anticipate pt will need follow up PT. Will follow acutely to progress mobility as tolerated.     Recommendations for follow up therapy are one component of a multi-disciplinary discharge planning process, led by the attending physician.  Recommendations may be updated based on patient status, additional functional criteria and insurance authorization.  Follow Up Recommendations No PT follow up;Supervision for mobility/OOB    Equipment Recommendations  None recommended by PT    Recommendations for Other Services       Precautions / Restrictions Precautions Precautions: Fall;Other (comment) Precaution Comments: buttock abscess (painful to sit) Restrictions Weight Bearing Restrictions: No      Mobility  Bed Mobility Overal bed mobility: Modified Independent                  Transfers Overall transfer level: Independent Equipment used: None                Ambulation/Gait Ambulation/Gait assistance: Supervision Gait Distance (Feet): 300 Feet Assistive device: None Gait Pattern/deviations: WFL(Within Functional Limits) Gait velocity: decreased   General Gait Details: slower pace, overall supervision for safety with intermittent  mild imbalance due to pt report of dizziness  Stairs            Wheelchair Mobility    Modified Rankin (Stroke Patients Only)       Balance Overall balance assessment: Mild deficits observed, not formally tested                                           Pertinent Vitals/Pain Pain Assessment: Faces Faces Pain Scale: Hurts a little bit Pain Location: buttocks Pain Descriptors / Indicators: Discomfort Pain Intervention(s): Monitored during session    Home Living Family/patient expects to be discharged to:: Private residence Living Arrangements: Parent (mother) Available Help at Discharge: Family Type of Home: Mobile home Home Access: Stairs to enter   Entrance Stairs-Number of Steps: 3 Home Layout: One level Home Equipment: Cane - single point;Walker - 2 wheels      Prior Function Level of Independence: Independent               Hand Dominance        Extremity/Trunk Assessment   Upper Extremity Assessment Upper Extremity Assessment: Overall WFL for tasks assessed    Lower Extremity Assessment Lower Extremity Assessment: Generalized weakness       Communication   Communication: No difficulties  Cognition Arousal/Alertness: Awake/alert Behavior During Therapy: WFL for tasks assessed/performed Overall Cognitive Status: Within Functional Limits for tasks assessed  General Comments      Exercises     Assessment/Plan    PT Assessment Patient needs continued PT services  PT Problem List Decreased strength;Decreased activity tolerance;Decreased balance;Decreased mobility;Pain       PT Treatment Interventions Gait training;Functional mobility training;Stair training;Therapeutic activities;Therapeutic exercise;Balance training;Patient/family education    PT Goals (Current goals can be found in the Care Plan section)  Acute Rehab PT Goals Patient Stated Goal: improved  dizziness, pain, walking tolerance PT Goal Formulation: With patient Time For Goal Achievement: 03/14/21 Potential to Achieve Goals: Good    Frequency Min 3X/week   Barriers to discharge        Co-evaluation               AM-PAC PT "6 Clicks" Mobility  Outcome Measure Help needed turning from your back to your side while in a flat bed without using bedrails?: None Help needed moving from lying on your back to sitting on the side of a flat bed without using bedrails?: None Help needed moving to and from a bed to a chair (including a wheelchair)?: None Help needed standing up from a chair using your arms (e.g., wheelchair or bedside chair)?: None Help needed to walk in hospital room?: A Little Help needed climbing 3-5 steps with a railing? : A Little 6 Click Score: 22    End of Session Equipment Utilized During Treatment: Gait belt Activity Tolerance: Patient tolerated treatment well Patient left: in bed;with call bell/phone within reach;with nursing/sitter in room Nurse Communication: Mobility status;Other (comment) (donut pillow) PT Visit Diagnosis: Unsteadiness on feet (R26.81);Muscle weakness (generalized) (M62.81)    Time: 4193-7902 PT Time Calculation (min) (ACUTE ONLY): 25 min   Charges:   PT Evaluation $PT Eval Moderate Complexity: 1 Mod PT Treatments $Therapeutic Activity: 8-22 mins        Wyona Almas, PT, DPT Acute Rehabilitation Services Pager [phone] Office [phone]   Deno Etienne 02/28/2021, 2:12 PM

## 2021-02-28 NOTE — Progress Notes (Signed)
Family Medicine Teaching Service Daily Progress Note Intern Pager: [phone]  Patient name: Tyler Alvarez Medical record number: [id number] Date of birth: 03-31-1976 Age: 45 y.o. Gender: male  Primary Care Provider: Donney Dice, DO Consultants: Nephrology Code Status: Full  Pt Overview and Major Events to Date:  10/13- Admitted  Assessment and Plan: Tyler Alvarez is a 45 y.o. male admitted for buttock abscess. PMH is significant for Tuberal Sclerosis, renal transplant (Prograft), HTN, ADHD, Aortic aneurysm, RLS and GERD.   Buttock abscess/cellulitis in setting of sepsis Remains afebrile. Had one episode of nausea yesterday that responded well to Phenergan. Feels well this morning and has no complaints. He is tolerating food and fluids well. Blood cultures with no growth at 3 days. WBC count within normal limits today, 8.6.  Wound cultures returned with rare normal skin flora, no group A strep, no staphylococcus aureus. Today is day 5 of Vancomycin and Cefepime. Given these results, although he has no concerning bacterial growth, he likely will only require 2 days of oral antibiotics to complete treatment that guidelines recommend for cellulitis/abscess. - Saline lock IV - Cardiac monitoring - Continuous pulse oximetry - Monitor for fever - Tylenol 1000 mg q6h PRN for fever, pain  CKD Stage IIIb s/p Renal Transplant  Chronic Immunosuppression Takes Tacrolimus and prednisone at home. Baseline Cr ~2. SCr on admission 2.26, today 1.83.   Will monitor Vancomycin levels per nephrology request. - Monitor renal function s/p IVF - Continue Tacrolimus 2mg  - Continue Prednisone 5mg  - Avoid NSAIDS, IV contrast - Monitor renal function with daily labs - Nephrology signed off, greatly appreciate their care for this patient - Follow-up with Dr. Joelyn Oms after discharge   Hypertension  BP 110-130s/70s-80s - Continue Amlodipine 5mg  daily - Monitor BP   Other conditions (RLS, GERD, ADHD,  seizure disorder) chronic, and stable. Home medications ordered.  FEN/GI: Renal diet PPx: Lovenox Dispo:Home  1-2 days . Barriers include IV antbiotics.   Subjective:  [name] was pleasant and eating breakfast in bed this morning. He had one episode of nausea yesterday that responded well to medication. No vomiting, SOB, cough. He is eating and drinking well.  Objective: Temp:  [98 F (36.7 C)-98.9 F (37.2 C)] 98 F (36.7 C) (10/17 0839) Pulse Rate:  [78-94] 80 (10/17 0839) Resp:  [16-19] 16 (10/17 0839) BP: (111-136)/(64-85) 114/78 (10/17 0839) SpO2:  [95 %-100 %] 98 % (10/17 0839) Physical Exam: General: Pleasant, eating breakfast, chronically ill appearing Cardiovascular: RRR Respiratory: Clear in all lung fields. Good aeration. No wheezing or crackles. Abdomen: Soft, thin, non-tender Buttocks: Non-tender over abscess area. Improving erythema. Clean, dry dressings in place with visualized dried blood contained by dressing. Extremities: Thin, warm, dry, well-perfused  Laboratory: Recent Labs  Lab 02/26/21 0223 02/27/21 0218 02/28/21 0550  WBC 13.2* 12.3* 8.6  HGB 9.5* 10.4* 9.5*  HCT 27.4* 30.5* 28.6*  PLT 194 234 230   Recent Labs  Lab 02/24/21 2015 02/25/21 0204 02/26/21 0223 02/27/21 0218 02/28/21 0550  NA 138   < > 137 138 136  K 3.9   < > 4.0 3.8 3.6  CL 101   < > 110 111 111  CO2 26   < > 19* 18* 20*  BUN 32*   < > 20 21* 20  CREATININE 2.26*   < > 1.92* 1.91* 1.83*  CALCIUM 9.6   < > 8.4* 8.8* 8.5*  PROT 7.2  --   --   --   --  BILITOT 0.9  --   --   --   --   ALKPHOS 61  --   --   --   --   ALT 10  --   --   --   --   AST 15  --   --   --   --   GLUCOSE 106*   < > 94 115* 163*   < > = values in this interval not displayed.    Imaging/Diagnostic Tests: No results found.   Orvis Brill, DO 02/28/2021, 9:38 AM PGY-1, [place] Intern pager: [phone], text pages welcome

## 2021-03-01 LAB — CULTURE, BLOOD (ROUTINE X 2)
Culture: NO GROWTH
Special Requests: ADEQUATE

## 2021-03-02 DIAGNOSIS — I719 Aortic aneurysm of unspecified site, without rupture: Secondary | ICD-10-CM | POA: Diagnosis not present

## 2021-03-02 DIAGNOSIS — K219 Gastro-esophageal reflux disease without esophagitis: Secondary | ICD-10-CM | POA: Diagnosis not present

## 2021-03-02 DIAGNOSIS — I129 Hypertensive chronic kidney disease with stage 1 through stage 4 chronic kidney disease, or unspecified chronic kidney disease: Secondary | ICD-10-CM | POA: Diagnosis not present

## 2021-03-02 DIAGNOSIS — G2581 Restless legs syndrome: Secondary | ICD-10-CM | POA: Diagnosis not present

## 2021-03-02 DIAGNOSIS — Z940 Kidney transplant status: Secondary | ICD-10-CM | POA: Diagnosis not present

## 2021-03-02 DIAGNOSIS — H9112 Presbycusis, left ear: Secondary | ICD-10-CM | POA: Diagnosis not present

## 2021-03-02 DIAGNOSIS — D631 Anemia in chronic kidney disease: Secondary | ICD-10-CM | POA: Diagnosis not present

## 2021-03-02 DIAGNOSIS — N189 Chronic kidney disease, unspecified: Secondary | ICD-10-CM | POA: Diagnosis not present

## 2021-03-02 DIAGNOSIS — G43909 Migraine, unspecified, not intractable, without status migrainosus: Secondary | ICD-10-CM | POA: Diagnosis not present

## 2021-03-02 DIAGNOSIS — L0231 Cutaneous abscess of buttock: Secondary | ICD-10-CM | POA: Diagnosis not present

## 2021-03-02 DIAGNOSIS — Z9181 History of falling: Secondary | ICD-10-CM | POA: Diagnosis not present

## 2021-03-02 DIAGNOSIS — Z7952 Long term (current) use of systemic steroids: Secondary | ICD-10-CM | POA: Diagnosis not present

## 2021-03-02 DIAGNOSIS — A419 Sepsis, unspecified organism: Secondary | ICD-10-CM | POA: Diagnosis not present

## 2021-03-02 DIAGNOSIS — K5900 Constipation, unspecified: Secondary | ICD-10-CM | POA: Diagnosis not present

## 2021-03-02 DIAGNOSIS — L03317 Cellulitis of buttock: Secondary | ICD-10-CM | POA: Diagnosis not present

## 2021-03-02 LAB — CULTURE, BLOOD (ROUTINE X 2): Culture: NO GROWTH

## 2021-03-08 DIAGNOSIS — L0231 Cutaneous abscess of buttock: Secondary | ICD-10-CM | POA: Diagnosis not present

## 2021-03-08 DIAGNOSIS — G2581 Restless legs syndrome: Secondary | ICD-10-CM | POA: Diagnosis not present

## 2021-03-08 DIAGNOSIS — D631 Anemia in chronic kidney disease: Secondary | ICD-10-CM | POA: Diagnosis not present

## 2021-03-08 DIAGNOSIS — I719 Aortic aneurysm of unspecified site, without rupture: Secondary | ICD-10-CM | POA: Diagnosis not present

## 2021-03-08 DIAGNOSIS — Z9181 History of falling: Secondary | ICD-10-CM | POA: Diagnosis not present

## 2021-03-08 DIAGNOSIS — L03317 Cellulitis of buttock: Secondary | ICD-10-CM | POA: Diagnosis not present

## 2021-03-08 DIAGNOSIS — K219 Gastro-esophageal reflux disease without esophagitis: Secondary | ICD-10-CM | POA: Diagnosis not present

## 2021-03-08 DIAGNOSIS — N189 Chronic kidney disease, unspecified: Secondary | ICD-10-CM | POA: Diagnosis not present

## 2021-03-08 DIAGNOSIS — I129 Hypertensive chronic kidney disease with stage 1 through stage 4 chronic kidney disease, or unspecified chronic kidney disease: Secondary | ICD-10-CM | POA: Diagnosis not present

## 2021-03-08 DIAGNOSIS — Z7952 Long term (current) use of systemic steroids: Secondary | ICD-10-CM | POA: Diagnosis not present

## 2021-03-08 DIAGNOSIS — K5900 Constipation, unspecified: Secondary | ICD-10-CM | POA: Diagnosis not present

## 2021-03-08 DIAGNOSIS — A419 Sepsis, unspecified organism: Secondary | ICD-10-CM | POA: Diagnosis not present

## 2021-03-08 DIAGNOSIS — G43909 Migraine, unspecified, not intractable, without status migrainosus: Secondary | ICD-10-CM | POA: Diagnosis not present

## 2021-03-08 DIAGNOSIS — Z940 Kidney transplant status: Secondary | ICD-10-CM | POA: Diagnosis not present

## 2021-03-08 DIAGNOSIS — H9112 Presbycusis, left ear: Secondary | ICD-10-CM | POA: Diagnosis not present

## 2021-03-09 DIAGNOSIS — T8189XA Other complications of procedures, not elsewhere classified, initial encounter: Secondary | ICD-10-CM | POA: Diagnosis not present

## 2021-03-10 DIAGNOSIS — K5900 Constipation, unspecified: Secondary | ICD-10-CM | POA: Diagnosis not present

## 2021-03-10 DIAGNOSIS — A419 Sepsis, unspecified organism: Secondary | ICD-10-CM | POA: Diagnosis not present

## 2021-03-10 DIAGNOSIS — L0231 Cutaneous abscess of buttock: Secondary | ICD-10-CM | POA: Diagnosis not present

## 2021-03-10 DIAGNOSIS — K219 Gastro-esophageal reflux disease without esophagitis: Secondary | ICD-10-CM | POA: Diagnosis not present

## 2021-03-10 DIAGNOSIS — L03317 Cellulitis of buttock: Secondary | ICD-10-CM | POA: Diagnosis not present

## 2021-03-10 DIAGNOSIS — H9112 Presbycusis, left ear: Secondary | ICD-10-CM | POA: Diagnosis not present

## 2021-03-10 DIAGNOSIS — N189 Chronic kidney disease, unspecified: Secondary | ICD-10-CM | POA: Diagnosis not present

## 2021-03-10 DIAGNOSIS — Z9181 History of falling: Secondary | ICD-10-CM | POA: Diagnosis not present

## 2021-03-10 DIAGNOSIS — G2581 Restless legs syndrome: Secondary | ICD-10-CM | POA: Diagnosis not present

## 2021-03-10 DIAGNOSIS — I719 Aortic aneurysm of unspecified site, without rupture: Secondary | ICD-10-CM | POA: Diagnosis not present

## 2021-03-10 DIAGNOSIS — D631 Anemia in chronic kidney disease: Secondary | ICD-10-CM | POA: Diagnosis not present

## 2021-03-10 DIAGNOSIS — Z7952 Long term (current) use of systemic steroids: Secondary | ICD-10-CM | POA: Diagnosis not present

## 2021-03-10 DIAGNOSIS — I129 Hypertensive chronic kidney disease with stage 1 through stage 4 chronic kidney disease, or unspecified chronic kidney disease: Secondary | ICD-10-CM | POA: Diagnosis not present

## 2021-03-10 DIAGNOSIS — G43909 Migraine, unspecified, not intractable, without status migrainosus: Secondary | ICD-10-CM | POA: Diagnosis not present

## 2021-03-10 DIAGNOSIS — Z940 Kidney transplant status: Secondary | ICD-10-CM | POA: Diagnosis not present

## 2021-03-10 NOTE — Discharge Summary (Signed)
[facility] Discharge Summary  Patient name: Tyler Alvarez Medical record number: [id number] Date of birth: 01-29-1976 Age: 45 y.o. Gender: male Date of Admission: 02/24/2021  Date of Discharge: 02/28/2021 Admitting Physician: Lyndee Hensen, DO  Primary Care Provider: Donney Dice, DO Consultants: Nephrology  Indication for Hospitalization: Buttock Abscess  Discharge Diagnoses/Problem List:  Buttock Abscess Tuberous Sclerosis Chronic Immunosuppression s/p Renal Transplant HTN ADHD Aortic Aneurysm RLS GERD  Disposition: Home  Discharge Condition: Improved, stable  Discharge Exam:  From Dr. Saul Fordyce note on day of discharge: General: Pleasant, eating breakfast, chronically ill appearing Cardiovascular: RRR Respiratory: Clear in all lung fields. Good aeration. No wheezing or crackles. Abdomen: Soft, thin, non-tender Buttocks: Non-tender over abscess area. Improving erythema. Clean, dry dressings in place with visualized dried blood contained by dressing. Extremities: Thin, warm, dry, well-perfused  Brief Hospital Course:  Tyler Alvarez is a 45 year old male admitted for buttock abscess/cellulitis.  PMH significant for tuberosclerosis, s/p renal transplant in 2021, hypertension, ADHD, aortic aneurysm, RLS and GERD.  Hospital course as outlined below:  Buttock abscess/cellulitis in setting of sepsis Patient presented with bilateral buttock abscess and cellulitis.  He underwent I&D of the abscess.  He presented meeting sepsis criteria with fever to 102.6 Fahrenheit, leukocytosis with left shift with WBC 16.2.  He received 30 mL/kg fluid bolus.  He was started on vancomycin and cefepime due to immunocompromise state.  He also endorsed cough, but no dyspnea or hypoxia, and chest x-ray suggested pneumonia with right base airspace opacity, but fortunately both antibiotics covered for acute CAP.  HIV was nonreactive.  MRSA swab negative.  He received  intravenous fluids and antibiotics (vancomycin and cefepime) for 5 days, and was discharged on oral antibiotics (Augmentin and doxycycline) for an additional 5 days.  History of renal transplant, immunosuppressed Serum creatinine on admission was 2.26, slightly above baseline of 2.  Nephrology followed patient throughout his admission and recommended monitoring vancomycin levels, IV fluids and resuming home medications including tacrolimus and prednisone.  Renal function was monitored with daily labs with creatinine of 1.83 on discharge.  Hypertension Patient initially presented with an episode of hypotension of 90s over 60s, and home amlodipine was held.  Once he was normotensive, amlodipine was resumed with BP ranging from 110-130s over 70s-80s.  Follow-up items for PCP 1.  Ensure abscess is healing appropriately and antibiotic treatment course has been completed  Significant Procedures: Bedside I&D in the ED on 10/13  Significant Labs and Imaging:  Results for Tyler Alvarez, Tyler Alvarez" (MRN [id number]) as of 03/10/2021 12:41  Ref. Range 02/26/2021 02:23 02/27/2021 02:18 02/28/2021 23:53  BASIC METABOLIC PANEL Unknown  Rpt (A) Rpt (A)  Sodium Latest Ref Range: 135 - 145 mmol/L 137 138 136  Potassium Latest Ref Range: 3.5 - 5.1 mmol/L 4.0 3.8 3.6  Chloride Latest Ref Range: 98 - 111 mmol/L 110 111 111  CO2 Latest Ref Range: 22 - 32 mmol/L 19 (L) 18 (L) 20 (L)  Glucose Latest Ref Range: 70 - 99 mg/dL 94 115 (H) 163 (H)  BUN Latest Ref Range: 6 - 20 mg/dL 20 21 (H) 20  Creatinine Latest Ref Range: 0.61 - 1.24 mg/dL 1.92 (H) 1.91 (H) 1.83 (H)  Calcium Latest Ref Range: 8.9 - 10.3 mg/dL 8.4 (L) 8.8 (L) 8.5 (L)  Anion gap Latest Ref Range: 5 - 15  8 9 5   Phosphorus Latest Ref Range: 2.5 - 4.6 mg/dL 1.9 (L)    Albumin Latest Ref Range: 3.5 -  5.0 g/dL 2.7 (L)    GFR, Estimated Latest Ref Range: >60 mL/min 43 (L) 43 (L) 46 (L)  WBC Latest Ref Range: 4.0 - 10.5 K/uL 13.2 (H) 12.3 (H) 8.6  RBC  Latest Ref Range: 4.22 - 5.81 MIL/uL 3.06 (L) 3.39 (L) 3.15 (L)  Hemoglobin Latest Ref Range: 13.0 - 17.0 g/dL 9.5 (L) 10.4 (L) 9.5 (L)  HCT Latest Ref Range: 39.0 - 52.0 % 27.4 (L) 30.5 (L) 28.6 (L)  MCV Latest Ref Range: 80.0 - 100.0 fL 89.5 90.0 90.8  MCH Latest Ref Range: 26.0 - 34.0 pg 31.0 30.7 30.2  MCHC Latest Ref Range: 30.0 - 36.0 g/dL 34.7 34.1 33.2  RDW Latest Ref Range: 11.5 - 15.5 % 12.8 12.8 13.0  Platelets Latest Ref Range: 150 - 400 K/uL 194 234 230  nRBC Latest Ref Range: 0.0 - 0.2 % 0.0 0.0 0.0    Results/Tests Pending at Time of Discharge: None  Discharge Medications:  Allergies as of 02/28/2021       Reactions   Erythromycin Nausea And Vomiting   Sulfa Antibiotics Itching, Nausea And Vomiting        Medication List     TAKE these medications    acetaminophen 500 MG tablet Commonly known as: TYLENOL Take 500 mg by mouth daily as needed for mild pain, headache or fever.   amLODipine 5 MG tablet Commonly known as: NORVASC Take 1 tablet (5 mg total) by mouth daily.   brompheniramine-pseudoephedrine 1-15 MG/5ML Elix Commonly known as: DIMETAPP Take 20 mLs by mouth daily as needed for allergies.   divalproex 250 MG DR tablet Commonly known as: DEPAKOTE Take 1 tablet (250 mg total) by mouth 2 (two) times daily. Only takes 1 per day What changed:  when to take this additional instructions   gabapentin 300 MG capsule Commonly known as: NEURONTIN TAKE 1 CAPSULE BY MOUTH EVERYDAY AT BEDTIME What changed: See the new instructions.   imipramine 25 MG tablet Commonly known as: TOFRANIL Take 50 mg by mouth at bedtime.   predniSONE 5 MG tablet Commonly known as: DELTASONE Take 5 mg by mouth daily.   tacrolimus 1 MG capsule Commonly known as: PROGRAF Take 2 mg by mouth 2 (two) times daily.       ASK your doctor about these medications    amoxicillin-clavulanate 875-125 MG tablet Commonly known as: AUGMENTIN Take 1 tablet by mouth 2 (two)  times daily for 5 days. Ask about: Should I take this medication?   doxycycline 100 MG tablet Commonly known as: VIBRA-TABS Take 1 tablet (100 mg total) by mouth 2 (two) times daily for 4 days. Ask about: Should I take this medication?               Discharge Care Instructions  (From admission, onward)           Start     Ordered   02/28/21 0000  Discharge wound care:       Comments: Insert 1/2" iodoform gauze Kellie Simmering # [id number]) into the abscesses, 1 over the left buttocks and 1 over the right gluteal cleft leaving a tail for easy removal. Secure with foam dressing. Carefully pull back foam dressing twice daily and replace the iodoform.   02/28/21 1523   02/28/21 0000  Discharge wound care:       Comments: Per RN. Change foam dressing twice daily   02/28/21 1601            Discharge Instructions: Please refer to  Patient Instructions section of EMR for full details.  Patient was counseled important signs and symptoms that should prompt return to medical care, changes in medications, dietary instructions, activity restrictions, and follow up appointments.   Follow-Up Appointments:  Follow-up Information     Care, [facility] Follow up.   Specialty: [place] Why: A representative from [facility] will contact you9 to arrange start date and time for your therapy. Contact information: [place] [address] [phone]                 Alcus Dad, MD PGY-2, [place]

## 2021-03-11 DIAGNOSIS — T8189XA Other complications of procedures, not elsewhere classified, initial encounter: Secondary | ICD-10-CM | POA: Diagnosis not present

## 2021-03-14 DIAGNOSIS — H9112 Presbycusis, left ear: Secondary | ICD-10-CM | POA: Diagnosis not present

## 2021-03-14 DIAGNOSIS — G2581 Restless legs syndrome: Secondary | ICD-10-CM | POA: Diagnosis not present

## 2021-03-14 DIAGNOSIS — N189 Chronic kidney disease, unspecified: Secondary | ICD-10-CM | POA: Diagnosis not present

## 2021-03-14 DIAGNOSIS — L0231 Cutaneous abscess of buttock: Secondary | ICD-10-CM | POA: Diagnosis not present

## 2021-03-14 DIAGNOSIS — D631 Anemia in chronic kidney disease: Secondary | ICD-10-CM | POA: Diagnosis not present

## 2021-03-14 DIAGNOSIS — Z940 Kidney transplant status: Secondary | ICD-10-CM | POA: Diagnosis not present

## 2021-03-14 DIAGNOSIS — Z9181 History of falling: Secondary | ICD-10-CM | POA: Diagnosis not present

## 2021-03-14 DIAGNOSIS — I719 Aortic aneurysm of unspecified site, without rupture: Secondary | ICD-10-CM | POA: Diagnosis not present

## 2021-03-14 DIAGNOSIS — G43909 Migraine, unspecified, not intractable, without status migrainosus: Secondary | ICD-10-CM | POA: Diagnosis not present

## 2021-03-14 DIAGNOSIS — L03317 Cellulitis of buttock: Secondary | ICD-10-CM | POA: Diagnosis not present

## 2021-03-14 DIAGNOSIS — K5900 Constipation, unspecified: Secondary | ICD-10-CM | POA: Diagnosis not present

## 2021-03-14 DIAGNOSIS — K219 Gastro-esophageal reflux disease without esophagitis: Secondary | ICD-10-CM | POA: Diagnosis not present

## 2021-03-14 DIAGNOSIS — Z7952 Long term (current) use of systemic steroids: Secondary | ICD-10-CM | POA: Diagnosis not present

## 2021-03-14 DIAGNOSIS — I129 Hypertensive chronic kidney disease with stage 1 through stage 4 chronic kidney disease, or unspecified chronic kidney disease: Secondary | ICD-10-CM | POA: Diagnosis not present

## 2021-03-14 DIAGNOSIS — A419 Sepsis, unspecified organism: Secondary | ICD-10-CM | POA: Diagnosis not present

## 2021-03-17 DIAGNOSIS — H9112 Presbycusis, left ear: Secondary | ICD-10-CM | POA: Diagnosis not present

## 2021-03-17 DIAGNOSIS — G43909 Migraine, unspecified, not intractable, without status migrainosus: Secondary | ICD-10-CM | POA: Diagnosis not present

## 2021-03-17 DIAGNOSIS — Z7952 Long term (current) use of systemic steroids: Secondary | ICD-10-CM | POA: Diagnosis not present

## 2021-03-17 DIAGNOSIS — G2581 Restless legs syndrome: Secondary | ICD-10-CM | POA: Diagnosis not present

## 2021-03-17 DIAGNOSIS — D631 Anemia in chronic kidney disease: Secondary | ICD-10-CM | POA: Diagnosis not present

## 2021-03-17 DIAGNOSIS — I719 Aortic aneurysm of unspecified site, without rupture: Secondary | ICD-10-CM | POA: Diagnosis not present

## 2021-03-17 DIAGNOSIS — I129 Hypertensive chronic kidney disease with stage 1 through stage 4 chronic kidney disease, or unspecified chronic kidney disease: Secondary | ICD-10-CM | POA: Diagnosis not present

## 2021-03-17 DIAGNOSIS — K219 Gastro-esophageal reflux disease without esophagitis: Secondary | ICD-10-CM | POA: Diagnosis not present

## 2021-03-17 DIAGNOSIS — K5900 Constipation, unspecified: Secondary | ICD-10-CM | POA: Diagnosis not present

## 2021-03-17 DIAGNOSIS — L0231 Cutaneous abscess of buttock: Secondary | ICD-10-CM | POA: Diagnosis not present

## 2021-03-17 DIAGNOSIS — N189 Chronic kidney disease, unspecified: Secondary | ICD-10-CM | POA: Diagnosis not present

## 2021-03-17 DIAGNOSIS — L03317 Cellulitis of buttock: Secondary | ICD-10-CM | POA: Diagnosis not present

## 2021-03-17 DIAGNOSIS — A419 Sepsis, unspecified organism: Secondary | ICD-10-CM | POA: Diagnosis not present

## 2021-03-17 DIAGNOSIS — Z940 Kidney transplant status: Secondary | ICD-10-CM | POA: Diagnosis not present

## 2021-03-17 DIAGNOSIS — Z9181 History of falling: Secondary | ICD-10-CM | POA: Diagnosis not present

## 2021-03-18 NOTE — Unmapped (Signed)
[facility] Refill Coordination Note    Specialty Medication(s) to be Shipped:   Transplant: Prograf 1mg  and Prednisone 5mg     Other medication(s) to be shipped: No additional medications requested for fill at this time     Oscar [name], DOB: 1975/07/09  Phone: [phone] (home)       All above HIPAA information was verified with patient's caregiver, Oscar [name])     Was a Nurse, learning disability used for this call? No    Completed refill call assessment today to schedule patient's medication shipment from the [facility] [phone]).  All relevant notes have been reviewed.     Specialty medication(s) and dose(s) confirmed: Regimen is correct and unchanged.   Changes to medications: Oscar [name] reports no changes at this time.  Changes to insurance: No  New side effects reported not previously addressed with a pharmacist or physician: None reported  Questions for the pharmacist: No    Confirmed patient received a Conservation officer, historic buildings and a Surveyor, mining with first shipment. The patient will receive a drug information handout for each medication shipped and additional FDA Medication Guides as required.       DISEASE/MEDICATION-SPECIFIC INFORMATION        N/A    SPECIALTY MEDICATION ADHERENCE     Medication Adherence    Patient reported X missed doses in the last month: 0  Specialty Medication: Prograf 1mg   Patient is on additional specialty medications: Yes  Additional Specialty Medications: Prednisone 5mg   Patient Reported Additional Medication X Missed Doses in the Last Month: 0  Patient is on more than two specialty medications: No  Adherence tools used: patient uses a pill box to manage medications  Support network for adherence: family member       Were doses missed due to medication being on hold? No    Prograf 1 mg: 6 days of medicine on hand   Prednisone 5 mg: 6 days of medicine on hand      REFERRAL TO PHARMACIST     Referral to the pharmacist: Not needed      [facility]     Shipping address confirmed in Epic.     Delivery Scheduled: Yes, Expected medication delivery date: 03/22/2021.     Medication will be delivered via UPS to the prescription address in Epic WAM.    Lorelei Pont [facility] Specialty Technician

## 2021-03-21 ENCOUNTER — Other Ambulatory Visit: Payer: Self-pay

## 2021-03-21 ENCOUNTER — Encounter: Payer: Self-pay | Admitting: Family Medicine

## 2021-03-21 ENCOUNTER — Ambulatory Visit (INDEPENDENT_AMBULATORY_CARE_PROVIDER_SITE_OTHER): Payer: Medicare Other | Admitting: Family Medicine

## 2021-03-21 VITALS — BP 110/80 | HR 111 | Ht 72.0 in | Wt 125.8 lb

## 2021-03-21 DIAGNOSIS — I10 Essential (primary) hypertension: Secondary | ICD-10-CM

## 2021-03-21 DIAGNOSIS — Z1211 Encounter for screening for malignant neoplasm of colon: Secondary | ICD-10-CM

## 2021-03-21 DIAGNOSIS — L0291 Cutaneous abscess, unspecified: Secondary | ICD-10-CM | POA: Diagnosis not present

## 2021-03-21 DIAGNOSIS — Z87898 Personal history of other specified conditions: Secondary | ICD-10-CM

## 2021-03-21 MED FILL — PROGRAF 1 MG CAPSULE: ORAL | 90 days supply | Qty: 360 | Fill #3

## 2021-03-21 MED FILL — PREDNISONE 5 MG TABLET: ORAL | 90 days supply | Qty: 90 | Fill #3

## 2021-03-21 NOTE — Patient Instructions (Signed)
It was great seeing you today!  I am glad you are doing well! Your blood pressure looks great today, please continue to take your amlodipine daily as prescribed.   I am glad you completed your antibiotics. Your abscess looks great, it has healed very nicely. Please keep that area clean and dry. If you start to develop unbearable pain, fever and chills then please contact our office.   Please follow up at your next scheduled appointment as needed, if anything arises between now and then, please don't hesitate to contact our office.   Thank you for allowing [place] to be a part of your medical care!  Thank you, Dr. Larae Grooms

## 2021-03-21 NOTE — Progress Notes (Signed)
    SUBJECTIVE:   Patient presents accompanied by mother for hospital follow up. He was recently hospitalized after meeting sepsis criteria with source likely from an abscess. Incision and drainage performed in the hospital. He was placed on broad spectrum antibiotics (vancomycin and cefepime) for 5 days and then transitioned to narrower coverage of augmentin and doxycycline which he endorses completed the course, mother verifies. Denies any fever, chills, pain and drainage.   Compliant on amlodipine daily. Denies chest pain and dyspnea. Checks BP regularly at home, typically ranges around systolic 130-865H and diastolic 84O.   Has a history of epilepsy since he was a child. Complaint on depakote as prescribed. He was following up with neurology before but has not in the past 5-6 years as he has been seizure-free since that long ago. Neurologist at the time told him that he would not need routine follow up anymore and could get his depakote prescription from his PCP.   OBJECTIVE:   BP 110/80   Pulse (!) 111   Ht 6' (1.829 m)   Wt 125 lb 12.8 oz (57.1 kg)   SpO2 96%   BMI 17.06 kg/m    General: Patient well-appearing, in no acute distress. CV: RRR, no murmurs or gallops auscultated Resp: CTAB Abdomen: soft, nontender, nondistended, presence of bowel sounds  Ext: normal ROM along UE bilaterally Neuro: gross sensation intact bilaterally  Derm: healing abscess noted long left buttocks, no drainage or bleeding, no surrounding erythema or edema noted, no rashes or other external lesions noted   Exam performed in the presence of chaperone.   ASSESSMENT/PLAN:   Abscess -buttocks abscess improved after hospitalization, completed antibiotic course -return precautions discussed  Essential hypertension -BP 110/80, at goal -continue amlodipine   History of seizures -reassuringly no seizures for years -continue depakote -labs obtained during last hospitalization -consider valproic acid  level at next visit but has not got one in years and has remained stable on medication dose      -Importance of colon cancer screening discussed. GI referral placed for colonoscopy.   Donney Dice, [place]

## 2021-03-22 DIAGNOSIS — Z940 Kidney transplant status: Principal | ICD-10-CM

## 2021-03-22 DIAGNOSIS — Z79899 Other long term (current) drug therapy: Principal | ICD-10-CM

## 2021-03-22 DIAGNOSIS — L0291 Cutaneous abscess, unspecified: Secondary | ICD-10-CM

## 2021-03-22 DIAGNOSIS — Z87898 Personal history of other specified conditions: Secondary | ICD-10-CM | POA: Insufficient documentation

## 2021-03-22 HISTORY — DX: Cutaneous abscess, unspecified: L02.91

## 2021-03-22 NOTE — Assessment & Plan Note (Signed)
-  reassuringly no seizures for years -continue depakote -labs obtained during last hospitalization -consider valproic acid level at next visit but has not got one in years and has remained stable on medication dose

## 2021-03-22 NOTE — Assessment & Plan Note (Signed)
-  BP 110/80, at goal -continue amlodipine

## 2021-03-22 NOTE — Assessment & Plan Note (Signed)
-  buttocks abscess improved after hospitalization, completed antibiotic course -return precautions discussed

## 2021-03-23 DIAGNOSIS — K5900 Constipation, unspecified: Secondary | ICD-10-CM | POA: Diagnosis not present

## 2021-03-23 DIAGNOSIS — I129 Hypertensive chronic kidney disease with stage 1 through stage 4 chronic kidney disease, or unspecified chronic kidney disease: Secondary | ICD-10-CM | POA: Diagnosis not present

## 2021-03-23 DIAGNOSIS — H9112 Presbycusis, left ear: Secondary | ICD-10-CM | POA: Diagnosis not present

## 2021-03-23 DIAGNOSIS — K219 Gastro-esophageal reflux disease without esophagitis: Secondary | ICD-10-CM | POA: Diagnosis not present

## 2021-03-23 DIAGNOSIS — Z9181 History of falling: Secondary | ICD-10-CM | POA: Diagnosis not present

## 2021-03-23 DIAGNOSIS — G43909 Migraine, unspecified, not intractable, without status migrainosus: Secondary | ICD-10-CM | POA: Diagnosis not present

## 2021-03-23 DIAGNOSIS — Z7952 Long term (current) use of systemic steroids: Secondary | ICD-10-CM | POA: Diagnosis not present

## 2021-03-23 DIAGNOSIS — I719 Aortic aneurysm of unspecified site, without rupture: Secondary | ICD-10-CM | POA: Diagnosis not present

## 2021-03-23 DIAGNOSIS — Z940 Kidney transplant status: Secondary | ICD-10-CM | POA: Diagnosis not present

## 2021-03-23 DIAGNOSIS — D631 Anemia in chronic kidney disease: Secondary | ICD-10-CM | POA: Diagnosis not present

## 2021-03-23 DIAGNOSIS — G2581 Restless legs syndrome: Secondary | ICD-10-CM | POA: Diagnosis not present

## 2021-03-23 DIAGNOSIS — N189 Chronic kidney disease, unspecified: Secondary | ICD-10-CM | POA: Diagnosis not present

## 2021-03-23 DIAGNOSIS — L0231 Cutaneous abscess of buttock: Secondary | ICD-10-CM | POA: Diagnosis not present

## 2021-03-23 DIAGNOSIS — A419 Sepsis, unspecified organism: Secondary | ICD-10-CM | POA: Diagnosis not present

## 2021-03-23 DIAGNOSIS — L03317 Cellulitis of buttock: Secondary | ICD-10-CM | POA: Diagnosis not present

## 2021-03-25 ENCOUNTER — Ambulatory Visit: Admit: 2021-03-25 | Discharge: 2021-03-25 | Payer: MEDICARE | Attending: Nephrology | Primary: Nephrology

## 2021-03-25 ENCOUNTER — Ambulatory Visit: Admit: 2021-03-25 | Discharge: 2021-03-25 | Payer: MEDICARE

## 2021-03-25 DIAGNOSIS — Z940 Kidney transplant status: Principal | ICD-10-CM

## 2021-03-25 DIAGNOSIS — Z79899 Other long term (current) drug therapy: Principal | ICD-10-CM

## 2021-03-25 DIAGNOSIS — M109 Gout, unspecified: Principal | ICD-10-CM

## 2021-03-25 DIAGNOSIS — N1832 Chronic kidney disease, stage 3b: Secondary | ICD-10-CM | POA: Diagnosis not present

## 2021-03-25 DIAGNOSIS — R918 Other nonspecific abnormal finding of lung field: Secondary | ICD-10-CM | POA: Diagnosis not present

## 2021-03-25 DIAGNOSIS — I7121 Aneurysm of the ascending aorta, without rupture: Secondary | ICD-10-CM | POA: Diagnosis not present

## 2021-03-25 DIAGNOSIS — D849 Immunodeficiency, unspecified: Secondary | ICD-10-CM | POA: Diagnosis not present

## 2021-03-25 DIAGNOSIS — R809 Proteinuria, unspecified: Secondary | ICD-10-CM | POA: Diagnosis not present

## 2021-03-25 DIAGNOSIS — K8020 Calculus of gallbladder without cholecystitis without obstruction: Secondary | ICD-10-CM | POA: Diagnosis not present

## 2021-03-25 DIAGNOSIS — I10 Essential (primary) hypertension: Secondary | ICD-10-CM | POA: Diagnosis not present

## 2021-03-25 LAB — URINALYSIS
BILIRUBIN UA: NEGATIVE
BLOOD UA: NEGATIVE
GLUCOSE UA: NEGATIVE
KETONES UA: NEGATIVE
LEUKOCYTE ESTERASE UA: NEGATIVE
NITRITE UA: NEGATIVE
PH UA: 6.5 (ref 5.0–9.0)
PROTEIN UA: 100 — AB
RBC UA: 1 /HPF (ref <=3)
SPECIFIC GRAVITY UA: 1.018 (ref 1.003–1.030)
SQUAMOUS EPITHELIAL: <1 /HPF (ref 0–5)
UROBILINOGEN UA: <2
WBC UA: 1 /HPF (ref <=2)

## 2021-03-25 LAB — CBC W/ AUTO DIFF
BASOPHILS ABSOLUTE COUNT: 0.1 10*9/L (ref 0.0–0.1)
BASOPHILS RELATIVE PERCENT: 0.9 %
EOSINOPHILS ABSOLUTE COUNT: 1.2 10*9/L — ABNORMAL HIGH (ref 0.0–0.5)
EOSINOPHILS RELATIVE PERCENT: 9.6 %
HEMATOCRIT: 37.6 % — ABNORMAL LOW (ref 39.0–48.0)
HEMOGLOBIN: 13 g/dL (ref 12.9–16.5)
LYMPHOCYTES ABSOLUTE COUNT: 2.8 10*9/L (ref 1.1–3.6)
LYMPHOCYTES RELATIVE PERCENT: 21.7 %
MEAN CORPUSCULAR HEMOGLOBIN CONC: 34.4 g/dL (ref 32.0–36.0)
MEAN CORPUSCULAR HEMOGLOBIN: 30.5 pg (ref 25.9–32.4)
MEAN CORPUSCULAR VOLUME: 88.6 fL (ref 77.6–95.7)
MEAN PLATELET VOLUME: 8.5 fL (ref 6.8–10.7)
MONOCYTES ABSOLUTE COUNT: 1 10*9/L — ABNORMAL HIGH (ref 0.3–0.8)
MONOCYTES RELATIVE PERCENT: 7.8 %
NEUTROPHILS ABSOLUTE COUNT: 7.8 10*9/L (ref 1.8–7.8)
NEUTROPHILS RELATIVE PERCENT: 60 %
NUCLEATED RED BLOOD CELLS: 0 /100{WBCs} (ref <=4)
PLATELET COUNT: 232 10*9/L (ref 150–450)
RED BLOOD CELL COUNT: 4.25 10*12/L — ABNORMAL LOW (ref 4.26–5.60)
RED CELL DISTRIBUTION WIDTH: 13.8 % (ref 12.2–15.2)
WBC ADJUSTED: 12.9 10*9/L — ABNORMAL HIGH (ref 3.6–11.2)

## 2021-03-25 LAB — COMPREHENSIVE METABOLIC PANEL
ALBUMIN: 4 g/dL (ref 3.4–5.0)
ALKALINE PHOSPHATASE: 70 U/L (ref 46–116)
ALT (SGPT): 10 U/L (ref 10–49)
ANION GAP: 10 mmol/L (ref 5–14)
AST (SGOT): 9 U/L (ref <=34)
BILIRUBIN TOTAL: 0.4 mg/dL (ref 0.3–1.2)
BLOOD UREA NITROGEN: 25 mg/dL — ABNORMAL HIGH (ref 9–23)
BUN / CREAT RATIO: 13
CALCIUM: 10.1 mg/dL (ref 8.7–10.4)
CHLORIDE: 104 mmol/L (ref 98–107)
CO2: 24.9 mmol/L (ref 20.0–31.0)
CREATININE: 1.98 mg/dL — ABNORMAL HIGH
EGFR CKD-EPI (2021) MALE: 41 mL/min/{1.73_m2} — ABNORMAL LOW (ref >=60)
GLUCOSE RANDOM: 106 mg/dL — ABNORMAL HIGH (ref 70–99)
POTASSIUM: 4.1 mmol/L (ref 3.4–4.8)
PROTEIN TOTAL: 7.6 g/dL (ref 5.7–8.2)
SODIUM: 139 mmol/L (ref 135–145)

## 2021-03-25 LAB — URIC ACID: URIC ACID: 11 mg/dL — ABNORMAL HIGH

## 2021-03-25 LAB — MAGNESIUM: MAGNESIUM: 1.8 mg/dL (ref 1.6–2.6)

## 2021-03-25 LAB — PHOSPHORUS: PHOSPHORUS: 3.5 mg/dL (ref 2.4–5.1)

## 2021-03-25 LAB — GAMMA GT: GAMMA GLUTAMYL TRANSFERASE: 47 U/L

## 2021-03-25 LAB — LIPID PANEL
CHOLESTEROL/HDL RATIO SCREEN: 3.5 (ref 1.0–4.5)
CHOLESTEROL: 140 mg/dL (ref <=200)
HDL CHOLESTEROL: 40 mg/dL (ref 40–60)
LDL CHOLESTEROL CALCULATED: 61 mg/dL (ref 40–99)
NON-HDL CHOLESTEROL: 100 mg/dL (ref 70–130)
TRIGLYCERIDES: 194 mg/dL — ABNORMAL HIGH (ref 0–150)
VLDL CHOLESTEROL CAL: 38.8 mg/dL (ref 11–50)

## 2021-03-25 LAB — LACTATE DEHYDROGENASE: LACTATE DEHYDROGENASE: 150 U/L (ref 120–246)

## 2021-03-25 LAB — PROTEIN / CREATININE RATIO, URINE
CREATININE, URINE: 136.8 mg/dL
PROTEIN URINE: 141.1 mg/dL
PROTEIN/CREAT RATIO, URINE: 1.031

## 2021-03-25 LAB — PARATHYROID HORMONE (PTH): PARATHYROID HORMONE INTACT: 205 pg/mL — ABNORMAL HIGH (ref 18.4–80.1)

## 2021-03-25 NOTE — Unmapped (Signed)
BP is high 149/93 mmhg. Pulse is 116-118. Patient denies any symptoms. But feels flutters in his heart. Patient verbalized  it feels normal to me. MD made aware

## 2021-03-25 NOTE — Unmapped (Signed)
I spent 15 minutes with the patient at his clinic visit. I reviewed and updated his medications. He took his tacrolimus at 1030pm last night. His BP is up today as he did not take his norvasc last night. He is having some nerve pain in his right arm since having an IV during an admission at [facility] for some boils and pneumonia. He recently had a flu and pneumonia vaccine. He will get the bivalent vaccine today. He had a gout flare in Jan -march which finally resolved after some steroids. He has not had any other flares since that.

## 2021-03-26 LAB — CMV DNA, QUANTITATIVE, PCR: CMV VIRAL LD: NOT DETECTED

## 2021-03-26 LAB — TACROLIMUS LEVEL, TROUGH: TACROLIMUS, TROUGH: 7.9 ng/mL (ref 5.0–15.0)

## 2021-03-29 LAB — EBV QUANTITATIVE PCR, BLOOD: EBV VIRAL LOAD RESULT: NOT DETECTED

## 2021-03-30 DIAGNOSIS — L0231 Cutaneous abscess of buttock: Secondary | ICD-10-CM | POA: Diagnosis not present

## 2021-03-30 DIAGNOSIS — I129 Hypertensive chronic kidney disease with stage 1 through stage 4 chronic kidney disease, or unspecified chronic kidney disease: Secondary | ICD-10-CM | POA: Diagnosis not present

## 2021-03-30 DIAGNOSIS — D631 Anemia in chronic kidney disease: Secondary | ICD-10-CM | POA: Diagnosis not present

## 2021-03-30 DIAGNOSIS — H9112 Presbycusis, left ear: Secondary | ICD-10-CM | POA: Diagnosis not present

## 2021-03-30 DIAGNOSIS — L03317 Cellulitis of buttock: Secondary | ICD-10-CM | POA: Diagnosis not present

## 2021-03-30 DIAGNOSIS — Z940 Kidney transplant status: Secondary | ICD-10-CM | POA: Diagnosis not present

## 2021-03-30 DIAGNOSIS — G43909 Migraine, unspecified, not intractable, without status migrainosus: Secondary | ICD-10-CM | POA: Diagnosis not present

## 2021-03-30 DIAGNOSIS — Z9181 History of falling: Secondary | ICD-10-CM | POA: Diagnosis not present

## 2021-03-30 DIAGNOSIS — K5900 Constipation, unspecified: Secondary | ICD-10-CM | POA: Diagnosis not present

## 2021-03-30 DIAGNOSIS — Z7952 Long term (current) use of systemic steroids: Secondary | ICD-10-CM | POA: Diagnosis not present

## 2021-03-30 DIAGNOSIS — G2581 Restless legs syndrome: Secondary | ICD-10-CM | POA: Diagnosis not present

## 2021-03-30 DIAGNOSIS — N189 Chronic kidney disease, unspecified: Secondary | ICD-10-CM | POA: Diagnosis not present

## 2021-03-30 DIAGNOSIS — A419 Sepsis, unspecified organism: Secondary | ICD-10-CM | POA: Diagnosis not present

## 2021-03-30 DIAGNOSIS — K219 Gastro-esophageal reflux disease without esophagitis: Secondary | ICD-10-CM | POA: Diagnosis not present

## 2021-03-30 DIAGNOSIS — I719 Aortic aneurysm of unspecified site, without rupture: Secondary | ICD-10-CM | POA: Diagnosis not present

## 2021-03-30 LAB — VITAMIN D 25 HYDROXY: VITAMIN D, TOTAL (25OH): 35.1 ng/mL (ref 20.0–80.0)

## 2021-03-31 MED ORDER — LOSARTAN 25 MG TABLET
ORAL_TABLET | Freq: Two times a day (BID) | ORAL | 3 refills | 90 days | Status: CP
Start: 2021-03-31 — End: 2022-03-31

## 2021-03-31 NOTE — Unmapped (Signed)
I reviewed UPC and scan levels with patient's mom and the previous conversation they had with Dr. Toni Arthurs. They are in agreement to stop the norvasc and start the losartan 25mg  bid. Dr. Toni Arthurs sent a RX. I told mom to call if BP is up after making the switch and we can increase the dose if needed.

## 2021-04-01 LAB — VITAMIN D 1,25 DIHYDROXY: VITAMIN D 1,25-DIHYDROXY: 25 pg/mL

## 2021-04-03 NOTE — Unmapped (Signed)
[facility] [place] transplant nephrology clinic visit    assessment and plan  1. s/p kidney transplant 02/22/2000. baseline creatinine 2 mg/dl. +proteinuria. stage iiib chronic kidney disease. no donor specific hla ab detected '21.  2. immunosuppression. prednisone 5mg  daily. tacrolimus 12hr lvl 5-7 ng/ml.  3. hypertension. +losartan 25mg  bid d/t proteinuria and left ventricular hypertrophy on echocardiogram. hold amlodipine. low dose beta blocker recommended d/t tachycardia and diastolic lv dysfunction; mr. Chawla declines at this time. blood pressure goal < 130/80 mmhg.  4. ascending aortic aneurysm 3.5cm. stable appearance on chest ct '21. [place] [facility] two year follow up appt in '23.  5. preventive medicine. influenza '21. pcv13 pneumococcal '17. ppsv23 pneumococcal '18. covid-19 pfizer vaccine '21. kidney ultrasound '21. dermatology appt recommended. screening colonoscopy recommended.    history of present illness    [name] is a 45 year old gentleman seen in follow up post kidney transplant 02/22/2000. no fever chills or sweats. no headache or lightheaded. no chest pain +occ palpitations no shortness of breath. no lower extremity edema. appetite nl. no abdominal pain no n/v/d. no myalgias or arthralgias. no dysuria hematuria or difficulty voiding. all other systems reviewed and negative x10 systems.    past medical history:  1. s/p living donor kidney transplant 02/22/2000. tuberous sclerosis. baseline creatinine 2 mg/dl.  2. hypertension  3. echocardiogram 07/20: +mod-severe lvh. lvef > 70%. +diastolic dysfunction. +ascending aorta dilatation.  4. seizure disorder hx  5. migraine  6. gout  7. 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  ??  past surgical history: bilateral native nephrectomy d/t renal cell carcinoma '99. left arm av fistula '99. kidney transplant '01. 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 left neck cyst excision '18. left neck and bilateral facial cyst excision '19.  ??  allergies: sulfa. erythromycin  ??  medications: tacrolimus 2mg  bid, prednisone 5mg  daily, amlodipine 5mg  daily, divalproex 500mg  daily, gabapentin 300mg  q.pm, imipramine 50mg  q.pm.    physical exam: t97.1 p116 bp149/93 wt58.4kg bmi 17.4. wd/wn gentleman appropriate affect and mood. sclera anicteric. nmm no thrush. neck supple +left neck cysts. heart tachycardic +iii/vi systolic murmur. lungs clear bilateral. abd soft nt/nd. no lower ext edema. msk no synovitis or tophi. skin no rash. neuro alert oriented non focal exam.    labs 03/25/21: creatinine 1.98. tacrolimus 7.9 ng/ml. cmv/ebv pcr not detected. urine protein/cr 1.031.

## 2021-04-04 LAB — FSAB CLASS 1 ANTIBODY SPECIFICITY: HLA CLASS 1 ANTIBODY RESULT: NEGATIVE

## 2021-04-04 LAB — FSAB CLASS 2 ANTIBODY SPECIFICITY: HLA CL2 AB RESULT: NEGATIVE

## 2021-04-04 LAB — HLA DS POST TRANSPLANT
ANTI-DONOR DRW #1 MFI: 227 MFI
ANTI-DONOR HLA-A #1 MFI: 11 MFI
ANTI-DONOR HLA-A #2 MFI: 0 MFI
ANTI-DONOR HLA-B #1 MFI: 248 MFI
ANTI-DONOR HLA-C #1 MFI: 31 MFI
ANTI-DONOR HLA-DQB #1 MFI: 0 MFI
ANTI-DONOR HLA-DR #1 MFI: 0 MFI

## 2021-04-05 ENCOUNTER — Other Ambulatory Visit: Payer: Self-pay | Admitting: Family Medicine

## 2021-04-05 DIAGNOSIS — Q851 Tuberous sclerosis: Principal | ICD-10-CM

## 2021-04-05 MED ORDER — DIVALPROEX 250 MG TABLET,DELAYED RELEASE
ORAL_TABLET | 3 refills | 0 days
Start: 2021-04-05 — End: ?

## 2021-04-12 MED ORDER — DIVALPROEX 250 MG TABLET,DELAYED RELEASE
ORAL_TABLET | ORAL | 3 refills | 0.00000 days | Status: CP
Start: 2021-04-12 — End: ?

## 2021-05-02 DIAGNOSIS — Q851 Tuberous sclerosis: Secondary | ICD-10-CM | POA: Diagnosis not present

## 2021-05-11 NOTE — H&P (Signed)
Subjective:     Patient ID: Tyler Alvarez is a 45 y.o. male.       Last treated 2019 for multiple cysts soft tissue. Returns desiring resection additional cysts. Had [facility]ital admission 02/2021 for buttock abscess. This has resolved.   PMH significant for tuberous sclerosis. 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 Second kidney prophylactically removed and patient was dialysis dependent for period prior to renal transplant donated by brother, completed at [facility]. On prednisone and Prograf, followed by [facility].    Has known SEM. As part of work up, noted to have ectasia ascending aorta/aneurysm. LAst seen by CTS at [facility] 01/2020- note reports stable and plan follow up imaging in 2 years. Most recent imaging 03/2021 as below.   EXAM: CT CHEST WO CONTRAST  DATE: 03/25/2021 1:38 PM  ACCESSION: [id number]  DICTATED: 03/25/2021 1:40 PM  INTERPRETATION LOCATION: [place]   CLINICAL INDICATION: 45 years old Male with Ascending aortic aneurysm (CMS - HCC)  - I71.21 - Ascending aortic aneurysm     COMPARISON: CT chest 02/05/2020, 01/08/2019   TECHNIQUE: Contiguous 0.6 or 1 mm and 2 mm axial images were reconstructed through the chest following a single breath hold helical acquisition.  Images were reformatted in the axial and sagittal planes.  MIP slabs were also constructed.   FINDINGS:   LUNGS AND AIRWAYS:   Abundant pulmonary nodules, micronodules, and thin-walled cysts.   PLEURA: No pleural fluid or pneumothorax.   MEDIASTINUM AND LYMPH NODES: No enlarged intrathoracic or axillary lymph nodes are present.  No other mediastinal abnormality.   HEART AND VASCULATURE:The cardiac chambers are normal in size. Mild aortic and subtle right coronary calcification There is no pericardial effusion. Ectatic ascending aorta measuring 37 mm in transverse dimension utilizing double oblique MPR technique, unchanged  since prior. Pulmonary artery normal in size.   BONES AND SOFT TISSUES: Unremarkable.   UPPER ABDOMEN: Reported separately.   OTHER: 1.3 cm right thyroid hypoattenuating lesion.   IMPRESSION:   Ascending aortic ectasia.   Mild coronary artery disease.   Pulmonary nodules and cysts concordant with patient's known diagnosis of tuberous sclerosis. Exam End: 03/25/21 13:38          Review of Systems Remainder 12 point review negative    Objective:   Physical Exam  Constitutional: He is oriented to person, place, and time.  Cardiovascular: Normal rate and regular rhythm.  Pulmonary/Chest: Effort normal and breath sounds normal.  Neurological: He is alert and oriented to person, place, and time.    HEENT:  Left neck with one to two enlarged cystic lesions 9 x 7 cm       Assessment:     Tuberous sclerosis s/p excision multiple cysts left neck Hx renal transplant on immunosuppression, steroids    Plan:      Plan excision of cysts in OR. Counseled if inflamed or abscessed may need to leave open as he has had in past. Reviewed his medications place him at greater risk wound healing problems and infection. Additional risks anesthesia, recurrence damage to adjacent structures reviewed. Asked him to trim beard prior to surgery.

## 2021-05-11 NOTE — Progress Notes (Signed)
Surgical Instructions    Your procedure is scheduled on Tuesday January 10.  Report to [facility] Main Entrance "A" at 10 A.M., then check in with the Admitting office.  Call this number if you have problems the morning of surgery:  [phone]   If you have any questions prior to your surgery date call [phone]: Open Monday-Friday 8am-4pm    Remember:  Do not eat after midnight the night before your surgery  You may drink clear liquids until 9am the morning of your surgery.   Clear liquids allowed are: Water, Non-Citrus Juices (without pulp), Carbonated Beverages, Clear Tea, Black Coffee ONLY (NO MILK, CREAM OR POWDERED CREAMER of any kind), and Gatorade    Take these medicines the morning of surgery with A SIP OF WATER predniSONE (DELTASONE) 5 MG tablet tacrolimus (PROGRAF) 1 MG capsule  IF NEEDED  acetaminophen (TYLENOL) 500 MG tablet   Follow your surgeon's instructions on when to stop Aspirin.  If no instructions were given by your surgeon then you will need to call the office to get those instructions.    As of today, STOP taking any Aspirin (unless otherwise instructed by your surgeon) Aleve, Naproxen, Ibuprofen, Motrin, Advil, Goody's, BC's, all herbal medications, fish oil, and all vitamins.     After your COVID test   You are not required to quarantine however you are required to wear a well-fitting mask when you are out and around people not in your household.  If your mask becomes wet or soiled, replace with a new one.  Wash your hands often with soap and water for 20 seconds or clean your hands with an alcohol-based hand sanitizer that contains at least 60% alcohol.  Do not share personal items.  Notify your provider: if you are in close contact with someone who has COVID  or if you develop a fever of 100.4 or greater, sneezing, cough, sore throat, shortness of breath or body aches.             Do not wear jewelry  Do not wear lotions, powders,  colognes, or deodorant. Do not shave 48 hours prior to surgery.  Men may shave face and neck. Do not bring valuables to the hospital. DO Not wear nail polish, gel polish, artificial nails, or any other type of covering on natural nails including finger and toenails. If patients have artificial nails, gel coating, etc. that need to be removed by a nail salon, please have this removed prior to surgery or surgery may need to be canceled/delayed if the surgeon/ anesthesia feels like the patient is unable to be adequately monitored.             [facility] is not responsible for any belongings or valuables.  Do NOT Smoke (Tobacco/Vaping)  24 hours prior to your procedure  If you use a CPAP at night, you may bring your mask for your overnight stay.   Contacts, glasses, hearing aids, dentures or partials may not be worn into surgery, please bring cases for these belongings   For patients admitted to the hospital, discharge time will be determined by your treatment team.   Patients discharged the day of surgery will not be allowed to drive home, and someone needs to stay with them for 24 hours.  NO VISITORS WILL BE ALLOWED IN PRE-OP WHERE PATIENTS ARE PREPPED FOR SURGERY.  ONLY 1 SUPPORT PERSON MAY BE PRESENT IN THE WAITING ROOM WHILE YOU ARE IN SURGERY.  IF YOU ARE TO BE  ADMITTED, ONCE YOU ARE IN YOUR ROOM YOU WILL BE ALLOWED TWO (2) VISITORS. 1 (ONE) VISITOR MAY STAY OVERNIGHT BUT MUST ARRIVE TO THE ROOM BY 8pm.  Minor children may have two parents present. Special consideration for safety and communication needs will be reviewed on a case by case basis.  Special instructions:    Oral Hygiene is also important to reduce your risk of infection.  Remember - BRUSH YOUR TEETH THE MORNING OF SURGERY WITH YOUR REGULAR TOOTHPASTE   [facility]- Preparing For Surgery  Before surgery, you can play an important role. Because skin is not sterile, your skin needs to be as free of germs as possible. You can  reduce the number of germs on your skin by washing with CHG (chlorahexidine gluconate) Soap before surgery.  CHG is an antiseptic cleaner which kills germs and bonds with the skin to continue killing germs even after washing.     Please do not use if you have an allergy to CHG or antibacterial soaps. If your skin becomes reddened/irritated stop using the CHG.  Do not shave (including legs and underarms) for at least 48 hours prior to first CHG shower. It is OK to shave your face.  Please follow these instructions carefully.     Shower the NIGHT BEFORE SURGERY and the MORNING OF SURGERY with CHG Soap.   If you chose to wash your hair, wash your hair first as usual with your normal shampoo. After you shampoo, rinse your hair and body thoroughly to remove the shampoo.  Then [employer] and genitals (private parts) with your normal soap and rinse thoroughly to remove soap.  After that Use CHG Soap as you would any other liquid soap. You can apply CHG directly to the skin and wash gently with a scrungie or a clean washcloth.   Apply the CHG Soap to your body ONLY FROM THE NECK DOWN.  Do not use on open wounds or open sores. Avoid contact with your eyes, ears, mouth and genitals (private parts). Wash Face and genitals (private parts)  with your normal soap.   Wash thoroughly, paying special attention to the area where your surgery will be performed.  Thoroughly rinse your body with warm water from the neck down.  DO NOT shower/wash with your normal soap after using and rinsing off the CHG Soap.  Pat yourself dry with a CLEAN TOWEL.  Wear CLEAN PAJAMAS to bed the night before surgery  Place CLEAN SHEETS on your bed the night before your surgery  DO NOT SLEEP WITH PETS.   Day of Surgery:  Take a shower with CHG soap. Wear Clean/Comfortable clothing the morning of surgery Do not apply any deodorants/lotions.   Remember to brush your teeth WITH YOUR REGULAR TOOTHPASTE.   Please read over  the following fact sheets that you were given.

## 2021-05-12 ENCOUNTER — Encounter (HOSPITAL_COMMUNITY): Payer: Self-pay

## 2021-05-12 ENCOUNTER — Other Ambulatory Visit: Payer: Self-pay

## 2021-05-12 ENCOUNTER — Encounter (HOSPITAL_COMMUNITY)
Admission: RE | Admit: 2021-05-12 | Discharge: 2021-05-12 | Disposition: A | Discharge status: Home / Self Care | Payer: Medicare Other | Source: Ambulatory Visit | Attending: Plastic Surgery | Admitting: Plastic Surgery

## 2021-05-12 VITALS — BP 139/84 | HR 109 | Temp 98.1°F | Resp 17 | Ht 72.0 in | Wt 128.3 lb

## 2021-05-12 DIAGNOSIS — D849 Immunodeficiency, unspecified: Secondary | ICD-10-CM | POA: Diagnosis not present

## 2021-05-12 DIAGNOSIS — Z86718 Personal history of other venous thrombosis and embolism: Secondary | ICD-10-CM | POA: Diagnosis not present

## 2021-05-12 DIAGNOSIS — Z85528 Personal history of other malignant neoplasm of kidney: Secondary | ICD-10-CM | POA: Insufficient documentation

## 2021-05-12 DIAGNOSIS — F988 Other specified behavioral and emotional disorders with onset usually occurring in childhood and adolescence: Secondary | ICD-10-CM | POA: Insufficient documentation

## 2021-05-12 DIAGNOSIS — Z940 Kidney transplant status: Secondary | ICD-10-CM | POA: Diagnosis not present

## 2021-05-12 DIAGNOSIS — I77810 Thoracic aortic ectasia: Secondary | ICD-10-CM | POA: Insufficient documentation

## 2021-05-12 DIAGNOSIS — I10 Essential (primary) hypertension: Secondary | ICD-10-CM

## 2021-05-12 DIAGNOSIS — L728 Other follicular cysts of the skin and subcutaneous tissue: Secondary | ICD-10-CM | POA: Insufficient documentation

## 2021-05-12 DIAGNOSIS — Z01818 Encounter for other preprocedural examination: Secondary | ICD-10-CM

## 2021-05-12 DIAGNOSIS — L720 Epidermal cyst: Secondary | ICD-10-CM | POA: Diagnosis not present

## 2021-05-12 DIAGNOSIS — Z01812 Encounter for preprocedural laboratory examination: Secondary | ICD-10-CM | POA: Insufficient documentation

## 2021-05-12 DIAGNOSIS — Z905 Acquired absence of kidney: Secondary | ICD-10-CM | POA: Insufficient documentation

## 2021-05-12 DIAGNOSIS — K219 Gastro-esophageal reflux disease without esophagitis: Secondary | ICD-10-CM | POA: Insufficient documentation

## 2021-05-12 DIAGNOSIS — Z79899 Other long term (current) drug therapy: Secondary | ICD-10-CM | POA: Diagnosis not present

## 2021-05-12 DIAGNOSIS — G43909 Migraine, unspecified, not intractable, without status migrainosus: Secondary | ICD-10-CM | POA: Diagnosis not present

## 2021-05-12 DIAGNOSIS — I1310 Hypertensive heart and chronic kidney disease without heart failure, with stage 1 through stage 4 chronic kidney disease, or unspecified chronic kidney disease: Secondary | ICD-10-CM | POA: Diagnosis not present

## 2021-05-12 DIAGNOSIS — D619 Aplastic anemia, unspecified: Secondary | ICD-10-CM | POA: Diagnosis not present

## 2021-05-12 DIAGNOSIS — R569 Unspecified convulsions: Secondary | ICD-10-CM | POA: Diagnosis not present

## 2021-05-12 DIAGNOSIS — R918 Other nonspecific abnormal finding of lung field: Secondary | ICD-10-CM | POA: Insufficient documentation

## 2021-05-12 DIAGNOSIS — R011 Cardiac murmur, unspecified: Secondary | ICD-10-CM | POA: Diagnosis not present

## 2021-05-12 DIAGNOSIS — N1832 Chronic kidney disease, stage 3b: Secondary | ICD-10-CM | POA: Insufficient documentation

## 2021-05-12 DIAGNOSIS — G2581 Restless legs syndrome: Secondary | ICD-10-CM | POA: Diagnosis not present

## 2021-05-12 DIAGNOSIS — Q851 Tuberous sclerosis: Secondary | ICD-10-CM | POA: Diagnosis not present

## 2021-05-12 HISTORY — DX: Aneurysm of the ascending aorta, without rupture: I71.21

## 2021-05-12 LAB — CBC
HCT: 36.5 % — ABNORMAL LOW (ref 39.0–52.0)
Hemoglobin: 11.8 g/dL — ABNORMAL LOW (ref 13.0–17.0)
MCH: 30.2 pg (ref 26.0–34.0)
MCHC: 32.3 g/dL (ref 30.0–36.0)
MCV: 93.4 fL (ref 80.0–100.0)
Platelets: 232 10*3/uL (ref 150–400)
RBC: 3.91 MIL/uL — ABNORMAL LOW (ref 4.22–5.81)
RDW: 13.1 % (ref 11.5–15.5)
WBC: 9.9 10*3/uL (ref 4.0–10.5)
nRBC: 0 % (ref 0.0–0.2)

## 2021-05-12 LAB — BASIC METABOLIC PANEL
Anion gap: 9 (ref 5–15)
BUN: 35 mg/dL — ABNORMAL HIGH (ref 6–20)
CO2: 25 mmol/L (ref 22–32)
Calcium: 9.6 mg/dL (ref 8.9–10.3)
Chloride: 106 mmol/L (ref 98–111)
Creatinine, Ser: 2.53 mg/dL — ABNORMAL HIGH (ref 0.61–1.24)
GFR, Estimated: 31 mL/min — ABNORMAL LOW (ref >60)
Glucose, Bld: 121 mg/dL — ABNORMAL HIGH (ref 70–99)
Potassium: 4.3 mmol/L (ref 3.5–5.1)
Sodium: 140 mmol/L (ref 135–145)

## 2021-05-12 NOTE — Progress Notes (Addendum)
Surgical Instructions    Your procedure is scheduled on Tuesday May 24, 2021 at 12:00 PM.  Report to [facility] Main Entrance "A" at 10 A.M., then check in with the Admitting office.  Call this number if you have problems the morning of surgery:  [phone]   If you have any questions prior to your surgery date call [phone]: Open Monday-Friday 8am-4pm    Remember:  Do not eat or drink after midnight the night before your surgery    Take these medicines the morning of surgery with A SIP OF WATER predniSONE (DELTASONE) 5 MG tablet tacrolimus (PROGRAF) 1 MG capsule  IF NEEDED  acetaminophen (TYLENOL) 500 MG tablet   Follow your surgeon's instructions on when to stop Aspirin.  If no instructions were given by your surgeon then you will need to call the office to get those instructions.    As of today, STOP taking any Aspirin (unless otherwise instructed by your surgeon) Aleve, Naproxen, Ibuprofen, Motrin, Advil, Goody's, BC's, all herbal medications, fish oil, and all vitamins.   Do NOT Smoke (Tobacco/Vaping)  24 hours prior to your procedure  If you use a CPAP at night, you may bring your mask for your overnight stay.   Contacts, glasses, hearing aids, dentures or partials may not be worn into surgery, please bring cases for these belongings   For patients admitted to the hospital, discharge time will be determined by your treatment team.   Patients discharged the day of surgery will not be allowed to drive home, and someone needs to stay with them for 24 hours.  NO VISITORS WILL BE ALLOWED IN PRE-OP WHERE PATIENTS ARE PREPPED FOR SURGERY.  ONLY 1 SUPPORT PERSON MAY BE PRESENT IN THE WAITING ROOM WHILE YOU ARE IN SURGERY.  IF YOU ARE TO BE ADMITTED, ONCE YOU ARE IN YOUR ROOM YOU WILL BE ALLOWED TWO (2) VISITORS. 1 (ONE) VISITOR MAY STAY OVERNIGHT BUT MUST ARRIVE TO THE ROOM BY 8pm.  Minor children may have two parents present. Special consideration for safety and  communication needs will be reviewed on a case by case basis.  Special instructions:    Oral Hygiene is also important to reduce your risk of infection.  Remember - BRUSH YOUR TEETH THE MORNING OF SURGERY WITH YOUR REGULAR TOOTHPASTE   [facility]- Preparing For Surgery  Before surgery, you can play an important role. Because skin is not sterile, your skin needs to be as free of germs as possible. You can reduce the number of germs on your skin by washing with CHG (chlorahexidine gluconate) Soap before surgery.  CHG is an antiseptic cleaner which kills germs and bonds with the skin to continue killing germs even after washing.     Please do not use if you have an allergy to CHG or antibacterial soaps. If your skin becomes reddened/irritated stop using the CHG.  Do not shave (including legs and underarms) for at least 48 hours prior to first CHG shower. It is OK to shave your face.  Please follow these instructions carefully.     Shower the NIGHT BEFORE SURGERY and the MORNING OF SURGERY with CHG Soap.   If you chose to wash your hair, wash your hair first as usual with your normal shampoo. After you shampoo, rinse your hair and body thoroughly to remove the shampoo.  Then [employer] and genitals (private parts) with your normal soap and rinse thoroughly to remove soap.  After that Use CHG Soap as you would  any other liquid soap. You can apply CHG directly to the skin and wash gently with a scrungie or a clean washcloth.   Apply the CHG Soap to your body ONLY FROM THE NECK DOWN.  Do not use on open wounds or open sores. Avoid contact with your eyes, ears, mouth and genitals (private parts). Wash Face and genitals (private parts)  with your normal soap.   Wash thoroughly, paying special attention to the area where your surgery will be performed.  Thoroughly rinse your body with warm water from the neck down.  DO NOT shower/wash with your normal soap after using and rinsing off the CHG  Soap.  Pat yourself dry with a CLEAN TOWEL.  Wear CLEAN PAJAMAS to bed the night before surgery  Place CLEAN SHEETS on your bed the night before your surgery  DO NOT SLEEP WITH PETS.   Day of Surgery:  Take a shower with CHG soap. Wear Clean/Comfortable clothing the morning of surgery Do not apply any deodorants/lotions.   Do not wear jewelry  Do not wear lotions, powders, colognes, or deodorant. Do not shave 48 hours prior to surgery.  Men may shave face and neck. Do not bring valuables to the hospital.  [facility] is not responsible for any belongings or valuables. Remember to brush your teeth WITH YOUR REGULAR TOOTHPASTE.   Please read over the following fact sheets that you were given.

## 2021-05-12 NOTE — Progress Notes (Signed)
PCP - Dr. Donney Dice Cardiologist - Dr. Caprice Kluver Nephrologist: Dr. Pearson Grippe  PPM/ICD - Denies  Chest x-ray - 03/25/21 - CE EKG - 02/28/21 Stress Test - Denies ECHO - 11/28/18 Cardiac Cath - Denies  Sleep Study - Years ago for Epilepsy, but no OSA. Per patient, he hasn't had a seizure since he was 45 years old.  Diabetic: Denies  Blood Thinner Instructions: N/A Aspirin Instructions: Follow surgeon's instructions  ERAS Protcol - No  COVID TEST- N/A   Anesthesia review: Yes, cardiac hx  Patient denies shortness of breath, fever, cough and chest pain at PAT appointment   All instructions explained to the patient, with a verbal understanding of the material. Patient agrees to go over the instructions while at home for a better understanding. Patient also instructed to self quarantine after being tested for COVID-19. The opportunity to ask questions was provided.

## 2021-05-17 NOTE — Anesthesia Preprocedure Evaluation (Addendum)
Anesthesia Evaluation  Patient identified by MRN, date of birth, ID band Patient awake    Reviewed: Allergy & Precautions, NPO status , Patient's Chart, lab work & pertinent test results  Airway Mallampati: II  TM Distance: >3 FB Neck ROM: Full    Dental no notable dental hx. (+) Poor Dentition, Dental Advisory Given, Chipped   Pulmonary    Pulmonary exam normal breath sounds clear to auscultation       Cardiovascular hypertension, Pt. on medications + Peripheral Vascular Disease  Normal cardiovascular exam Rhythm:Regular Rate:Normal  Echo 11/28/18 [facility]):  Left ventricular hypertrophy - moderate to severe   Hyperdynamic left ventricular systolic function, ejection fraction > 22%   Diastolic dysfunction - grade I (normal filling pressures)   Dilated ascending aorta. 4.4 cm aorta at Sinuses.  Normal right ventricular systolic function   Neuro/Psych Seizures - (hx tuberos sclerosis sz well contolled last 1980s), Well Controlled,  PSYCHIATRIC DISORDERS Anxiety ADD   GI/Hepatic GERD  ,  Endo/Other    Renal/GU ESRF and Renal InsufficiencyRenal diseaseLab Results      Component                Value               Date                      CREATININE               2.53 (H)            05/12/2021                BUN                      35 (H)              05/12/2021                NA                       140                 05/12/2021                K                        4.3                 05/12/2021                   CKD (due to tuberous sclerosis & renal cancer, s/p nephrectomies 1999 with ESRD; s/p living related renal transplant 02/22/00; post-transplant CKD 3b), immune deficiency disorder (on immunosuppressants for renal transplant     Musculoskeletal negative musculoskeletal ROS (+)   Abdominal   Peds  Hematology  (+) anemia , Lab Results      Component                Value               Date                       WBC                      9.9  05/12/2021                HGB                      11.8 (L)            05/12/2021                HCT                      36.5 (L)            05/12/2021                       PLT                      232                 05/12/2021              Anesthesia Other Findings   Reproductive/Obstetrics                          Anesthesia Physical Anesthesia Plan  ASA: 3  Anesthesia Plan: General   Post-op Pain Management: Ofirmev IV (intra-op)   Induction: Intravenous, Rapid sequence and Cricoid pressure planned  PONV Risk Score and Plan: 3 and Treatment may vary due to age or medical condition, Midazolam and Ondansetron  Airway Management Planned: Oral ETT  Additional Equipment: None  Intra-op Plan:   Post-operative Plan: Extubation in OR  Informed Consent: I have reviewed the patients History and Physical, chart, labs and discussed the procedure including the risks, benefits and alternatives for the proposed anesthesia with the patient or authorized representative who has indicated his/her understanding and acceptance.     Dental advisory given  Plan Discussed with: CRNA and Anesthesiologist  Anesthesia Plan Comments: (PAT note written 05/17/2021 by Myra Gianotti, PA-C.  Stress dose Steroids )    Anesthesia Quick Evaluation

## 2021-05-17 NOTE — Progress Notes (Signed)
Anesthesia Chart Review:  Case: [id number] Date/Time: 05/24/21 1145   Procedure: EXCISION BENIGN LESION NECK 9CM, LAYERED CLOSURE NECK 10CM (Neck)   Anesthesia type: General   Pre-op diagnosis: left cyst neck, tuberous sclerosis   Location: [facility] OR ROOM 02 / [place] OR   Surgeons: Irene Limbo, MD       DISCUSSION: Patient is a 46 year old male scheduled for the above procedure.   History includes never smoker, HTN, tuberous sclerosis, CKD (due to tuberous sclerosis & renal cancer, s/p nephrectomies 1999 with ESRD; s/p living related renal transplant 02/22/00; post-transplant CKD 3b), immune deficiency disorder (on immunosuppressants for renal transplant), seizures (secondary to tuberous sclerosis; controlled on Depakote, last seizure ~ 1980s), migraines, RLS, ADD, DVT (left subclavian (07/2014), murmur (trivial TR, mild PR 05/2006; no valvular disease documented in 11/2018 echo summary), ascending thoracic aortic ectasia (37 mm 03/25/21 CT), GERD, epidermoid cysts (excision left neck cyst 01/07/15 & 12/11/16; excision left neck and bilateral cheek cysts & excision right preauricular melanocytic nevus 04/01/18), buttock abscess (admission 02/2021, s/p I&D in ED, antibiotics). Patient's father had what sounds like post-operative apnea but "came through okay". No reported personal history of adverse reaction to anesthesia.   Preoperative labs on 05/12/21 showed BUN 35, Creatinine 2.53, eGFF 31, consistent with known CKD 3b. He is followed by [facility] transplant nephrologist Dr. Toy Cookey and by Dr. Joelyn Oms with [facility] for known CKD 3b since 2001 renal transplant. Previous baseline Creatinine ~ 2.0 with eGFR 41. H/H 11.8/36.5.   Anesthesia team to evaluate on the day of surgery.    VS: BP 139/84    Pulse (!) 109    Temp 36.7 C    Resp 17    Ht 6' (1.829 m)    Wt 58.2 kg    SpO2 99%    BMI 17.40 kg/m  BP Readings from Last 3 Encounters:  05/12/21 139/84  03/21/21 110/80  02/28/21 117/80    Pulse Readings from Last 3 Encounters:  05/12/21 (!) 109  03/21/21 (!) 111  02/28/21 88     PROVIDERS: Donney Dice, DO is PCP  - Pearson Grippe, MD is nephrologist ([place]). 02/22/21 office note is scanned under Media tab. 4 month follow-up planned. Caprice Kluver, MD is transplant nephrologist [facility]). Last visit 03/25/21. Baseline Creatinine ~ 2, + proteinuria. On prednisone and tacrolimus for immunosuppression. Amlodipine held and losartan started for HTN, LVH (on echo), and proteinuria. Low dose b-blocker discussed due to tachycardia and diastolic dysfunction, but patient declined at that time.  Dwaine Deter, MD is CT surgeon [facility]). Last visit 02/11/20 for follow-up ascending aortic aneurysm which was incidentally found on 2020 echo 4.4 cm aorta at sinuses) with ascending thoracic aorta measuring 3.6 cm by 2021 CT.  Two year follow-up recommended.  - Last neurology visit seen 08/05/15 by Lawana Pai, MD [facility]). Seizures remained controlled on Depakote since the 1980's. As needed follow-up recommended if he develops more frequent seizures.  - Last pulmonology visit seen 12/10/13 by Thad Ranger, MD [facility]). Seen for follow-up pulmonary nodules consistent with micronodular pneumocyte hyperplasia. Nodules stable over several years. He was "relatively asymptomatic" with normal PLFTs 04/28/13. No further pulmonary work-up recommended at that time with as needed pulmonology follow-up.   LABS: Preoperative labs noted. See DISCUSSION. A1c 5.2% on 02/18/21.  (all labs ordered are listed, but only abnormal results are displayed)  Labs Reviewed  CBC - Abnormal; Notable for the following components:      Result Value  RBC 3.91 (*)    Hemoglobin 11.8 (*)    HCT 36.5 (*)    All other components within normal limits  BASIC METABOLIC PANEL - Abnormal; Notable for the following components:   Glucose, Bld 121 (*)    BUN 35 (*)    Creatinine, Ser 2.53 (*)    GFR, Estimated  31 (*)    All other components within normal limits     IMAGES: CT Chest 03/25/21 [facility]): Impression: - Ascending aortic ectasia measuring 37 mm in transverse dimension utilizing double oblique MPR technique, unchanged since prior. - Mild coronary artery disease.  - Pulmonary nodules and cysts concordant with patient's known diagnosis of tuberous sclerosis.  CT Abd/pelvis 03/25/21 [facility]): Findings:  - HEPATOBILIARY: No focal hepatic lesions. Cholelithiasis. No biliary dilatation.    - SPLEEN: No splenomegaly.  - PANCREAS: No focal masses or ductal dilatation.  - ADRENALS: No adrenal nodules.  - KIDNEYS/URETERS AND BLADDER: Status post bilateral nephrectomies. Left lower quadrant transplant kidney with stable lower pole hypodensity, likely a cyst. Multiple additional peripheral subcentimeter hyperattenuating lesions in the transplant kidney (1:75, 91, 96), likely hemorrhagic cysts. No hydronephrosis or hydroureter.  - BLADDER: Decompressed  - PELVIC/REPRODUCTIVE ORGANS: Unremarkable.  - GI TRACT: No dilated or thick walled loops of bowel. Normal appendix.  - PERITONEUM/RETROPERITONEUM AND MESENTERY: No free air or fluid.  - LYMPH NODES: No enlarged lymph nodes in the abdomen or pelvis.  - BONES AND SOFT TISSUES: Unremarkable.  - VASCULAR: Abdominal aorta is nondilated.  US Renal Transplant 03/25/21 [facility]): Impression: Resistive indices in the renal transplant arteries are similar to slightly increased compared to prior, though remain within normal limits.     EKG: 02/24/21:  Sinus rhythm Probable LVH with secondary repol abnrm Confirmed by Addison Lank [phone]) on 02/25/2021 7:42:06 PM   CV: Echo 11/28/18 [facility] CE):  Left ventricular hypertrophy - moderate to severe   Hyperdynamic left ventricular systolic function, ejection fraction > 34%   Diastolic dysfunction - grade I (normal filling pressures)   Dilated ascending aorta. 4.4 cm aorta at Sinuses.  Normal right  ventricular systolic function   Past Medical History:  Diagnosis Date   ADD (attention deficit disorder)    Anxiety    Ascending aortic aneurysm    Benign skin lesion of neck    left neck cyst   Chronic kidney disease    s/p transplant 2001   Family history of adverse reaction to anesthesia    Pt mother stated Father-"I think that he stopped breathing, they called a code but he came through okay"   GERD (gastroesophageal reflux disease)    Headache    Migraines   Heart murmur    Hypertension    Immune deficiency disorder ([place])    Restless leg syndrome    Seizures ([place])    treated by Dr. 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[place])    Wears glasses     Past Surgical History:  Procedure Laterality Date   AV FISTULA PLACEMENT     KIDNEY TRANSPLANT  2001   MASS EXCISION Left 12/11/2016   Procedure: EXCISION OF BENIGN LESION OF THE NECK WITH LAYERED CLOSURE;  Surgeon: Irene Limbo, MD;  Location: [place];  Service: Plastics;  Laterality: Left;   MASS EXCISION     neck x2   MASS EXCISION Bilateral 04/01/2018   Procedure: excision benign lesions left neck and right and left cheek ( 4cm, 2cm, 1cm x3),  layered closure neck <10cm, layered closure right cheek 1cm;  Surgeon: Irene Limbo, MD;  Location: [place];  Service: Plastics;  Laterality: Bilateral;  left cheek lesion   MULTIPLE TOOTH EXTRACTIONS     NEPHRECTOMY     both removed in 1999, 3 months apart   REVISON OF ARTERIOVENOUS FISTULA Left 07/16/2014   Procedure: EXCISION ANEURYSMAL AREA OF LEFT ARTERIOVENOUS FISTULA;  Surgeon: Serafina Mitchell, MD;  Location: [facility];  Service: Vascular;  Laterality: Left;    MEDICATIONS:  acetaminophen (TYLENOL) 500 MG tablet   amLODipine (NORVASC) 5 MG tablet   aspirin EC 81 MG tablet   brompheniramine-pseudoephedrine (DIMETAPP) 1-15 MG/5ML ELIX   Cholecalciferol (VITAMIN D3 PO)   divalproex (DEPAKOTE) 250 MG DR tablet   gabapentin (NEURONTIN) 300 MG capsule    imipramine (TOFRANIL) 25 MG tablet   losartan (COZAAR) 25 MG tablet   predniSONE (DELTASONE) 5 MG tablet   tacrolimus (PROGRAF) 1 MG capsule   No current facility-administered medications for this encounter.    Myra Gianotti, PA-C Surgical Short Stay/Anesthesiology [facility] Phone [phone] [facility] [address] Phone [phone] 05/17/2021 10:53 AM

## 2021-05-19 ENCOUNTER — Other Ambulatory Visit: Payer: Self-pay | Admitting: Family Medicine

## 2021-05-19 DIAGNOSIS — Q851 Tuberous sclerosis: Secondary | ICD-10-CM

## 2021-05-24 ENCOUNTER — Ambulatory Visit (HOSPITAL_COMMUNITY): Payer: Medicare Other | Admitting: Anesthesiology

## 2021-05-24 ENCOUNTER — Ambulatory Visit (HOSPITAL_COMMUNITY)
Admission: RE | Admit: 2021-05-24 | Discharge: 2021-05-24 | Disposition: A | Discharge status: Home / Self Care | Payer: Medicare Other | Attending: Plastic Surgery | Admitting: Plastic Surgery

## 2021-05-24 ENCOUNTER — Encounter (HOSPITAL_COMMUNITY)
Admission: RE | Disposition: A | Discharge status: Home / Self Care | Payer: Self-pay | Source: Home / Self Care | Attending: Plastic Surgery

## 2021-05-24 ENCOUNTER — Other Ambulatory Visit: Payer: Self-pay

## 2021-05-24 ENCOUNTER — Ambulatory Visit (HOSPITAL_COMMUNITY): Payer: Medicare Other | Admitting: Physician Assistant

## 2021-05-24 ENCOUNTER — Encounter (HOSPITAL_COMMUNITY): Payer: Self-pay | Admitting: Plastic Surgery

## 2021-05-24 DIAGNOSIS — L720 Epidermal cyst: Secondary | ICD-10-CM | POA: Insufficient documentation

## 2021-05-24 DIAGNOSIS — Z85528 Personal history of other malignant neoplasm of kidney: Secondary | ICD-10-CM | POA: Insufficient documentation

## 2021-05-24 DIAGNOSIS — N1832 Chronic kidney disease, stage 3b: Secondary | ICD-10-CM | POA: Diagnosis not present

## 2021-05-24 DIAGNOSIS — Q851 Tuberous sclerosis: Secondary | ICD-10-CM | POA: Diagnosis not present

## 2021-05-24 DIAGNOSIS — Z940 Kidney transplant status: Secondary | ICD-10-CM | POA: Diagnosis not present

## 2021-05-24 DIAGNOSIS — Q613 Polycystic kidney, unspecified: Secondary | ICD-10-CM | POA: Diagnosis not present

## 2021-05-24 DIAGNOSIS — I129 Hypertensive chronic kidney disease with stage 1 through stage 4 chronic kidney disease, or unspecified chronic kidney disease: Secondary | ICD-10-CM | POA: Insufficient documentation

## 2021-05-24 DIAGNOSIS — I739 Peripheral vascular disease, unspecified: Secondary | ICD-10-CM | POA: Diagnosis not present

## 2021-05-24 DIAGNOSIS — Z7969 Long term (current) use of other immunomodulators and immunosuppressants: Secondary | ICD-10-CM | POA: Insufficient documentation

## 2021-05-24 DIAGNOSIS — Z9889 Other specified postprocedural states: Secondary | ICD-10-CM | POA: Insufficient documentation

## 2021-05-24 DIAGNOSIS — I2510 Atherosclerotic heart disease of native coronary artery without angina pectoris: Secondary | ICD-10-CM | POA: Diagnosis not present

## 2021-05-24 DIAGNOSIS — K219 Gastro-esophageal reflux disease without esophagitis: Secondary | ICD-10-CM | POA: Insufficient documentation

## 2021-05-24 DIAGNOSIS — L723 Sebaceous cyst: Secondary | ICD-10-CM | POA: Diagnosis not present

## 2021-05-24 HISTORY — PX: LESION EXCISION WITH COMPLEX REPAIR: SHX6700

## 2021-05-24 SURGERY — LESION EXCISION WITH COMPLEX REPAIR
Anesthesia: General | Site: Neck | Laterality: Left

## 2021-05-24 MED ORDER — ACETAMINOPHEN 10 MG/ML IV SOLN
1000.0000 mg | Freq: Once | INTRAVENOUS | Status: DC | PRN
  Administered 2021-05-24: 1000 mg via INTRAVENOUS

## 2021-05-24 MED ORDER — PHENYLEPHRINE 40 MCG/ML (10ML) SYRINGE FOR IV PUSH (FOR BLOOD PRESSURE SUPPORT)
PREFILLED_SYRINGE | INTRAVENOUS | Status: DC | PRN
Start: 2021-05-24 — End: 2021-05-24
  Administered 2021-05-24 (×2): 80 ug via INTRAVENOUS

## 2021-05-24 MED ORDER — ORAL CARE MOUTH RINSE: 15.0000 mL | Freq: Once | OROMUCOSAL | Status: AC

## 2021-05-24 MED ORDER — SODIUM CHLORIDE 0.9 % IV SOLN: INTRAVENOUS | Status: DC

## 2021-05-24 MED ORDER — PHENYLEPHRINE 40 MCG/ML (10ML) SYRINGE FOR IV PUSH (FOR BLOOD PRESSURE SUPPORT)
PREFILLED_SYRINGE | INTRAVENOUS | Status: AC
  Filled 2021-05-24: qty 10

## 2021-05-24 MED ORDER — AMISULPRIDE (ANTIEMETIC) 5 MG/2ML IV SOLN: 10.0000 mg | Freq: Once | INTRAVENOUS | Status: DC | PRN

## 2021-05-24 MED ORDER — BUPIVACAINE-EPINEPHRINE (PF) 0.25% -1:200000 IJ SOLN
INTRAMUSCULAR | Status: AC
  Filled 2021-05-24: qty 30

## 2021-05-24 MED ORDER — ACETAMINOPHEN 10 MG/ML IV SOLN
INTRAVENOUS | Status: AC
  Filled 2021-05-24: qty 100

## 2021-05-24 MED ORDER — 0.9 % SODIUM CHLORIDE (POUR BTL) OPTIME
TOPICAL | Status: DC | PRN
Start: 2021-05-24 — End: 2021-05-24
  Administered 2021-05-24: 200 mL

## 2021-05-24 MED ORDER — ALBUMIN HUMAN 5 % IV SOLN
INTRAVENOUS | Status: AC
  Filled 2021-05-24: qty 250

## 2021-05-24 MED ORDER — HYDROMORPHONE HCL 1 MG/ML IJ SOLN: 0.2500 mg | INTRAMUSCULAR | Status: DC | PRN

## 2021-05-24 MED ORDER — ONDANSETRON HCL 4 MG/2ML IJ SOLN
INTRAMUSCULAR | Status: AC
  Filled 2021-05-24: qty 2

## 2021-05-24 MED ORDER — ALBUMIN HUMAN 5 % IV SOLN
12.5000 g | Freq: Once | INTRAVENOUS | Status: AC
  Administered 2021-05-24: 12.5 g via INTRAVENOUS

## 2021-05-24 MED ORDER — FENTANYL CITRATE (PF) 100 MCG/2ML IJ SOLN
INTRAMUSCULAR | Status: DC | PRN
  Administered 2021-05-24 (×2): 50 ug via INTRAVENOUS

## 2021-05-24 MED ORDER — MIDAZOLAM HCL 2 MG/2ML IJ SOLN
INTRAMUSCULAR | Status: AC
  Filled 2021-05-24: qty 2

## 2021-05-24 MED ORDER — OXYCODONE HCL 5 MG PO TABS
ORAL_TABLET | ORAL | Status: AC
  Filled 2021-05-24: qty 1

## 2021-05-24 MED ORDER — CEFAZOLIN SODIUM-DEXTROSE 2-4 GM/100ML-% IV SOLN
2.0000 g | INTRAVENOUS | Status: AC
  Administered 2021-05-24: 2 g via INTRAVENOUS
  Filled 2021-05-24: qty 100

## 2021-05-24 MED ORDER — OXYCODONE HCL 5 MG/5ML PO SOLN: 5.0000 mg | Freq: Once | ORAL | Status: AC | PRN

## 2021-05-24 MED ORDER — BACITRACIN ZINC 500 UNIT/GM EX OINT
TOPICAL_OINTMENT | CUTANEOUS | Status: AC
  Filled 2021-05-24: qty 28.35

## 2021-05-24 MED ORDER — CHLORHEXIDINE GLUCONATE 0.12 % MT SOLN
15.0000 mL | Freq: Once | OROMUCOSAL | Status: AC
  Administered 2021-05-24: 15 mL via OROMUCOSAL
  Filled 2021-05-24: qty 15

## 2021-05-24 MED ORDER — ROCURONIUM BROMIDE 100 MG/10ML IV SOLN
INTRAVENOUS | Status: DC | PRN
  Administered 2021-05-24: 10 mg via INTRAVENOUS
  Administered 2021-05-24: 50 mg via INTRAVENOUS

## 2021-05-24 MED ORDER — FENTANYL CITRATE (PF) 250 MCG/5ML IJ SOLN
INTRAMUSCULAR | Status: AC
  Filled 2021-05-24: qty 5

## 2021-05-24 MED ORDER — BUPIVACAINE-EPINEPHRINE 0.25% -1:200000 IJ SOLN
INTRAMUSCULAR | Status: DC | PRN
  Administered 2021-05-24: 18 mL

## 2021-05-24 MED ORDER — DEXAMETHASONE SODIUM PHOSPHATE 10 MG/ML IJ SOLN
INTRAMUSCULAR | Status: DC | PRN
  Administered 2021-05-24: 10 mg via INTRAVENOUS

## 2021-05-24 MED ORDER — ONDANSETRON HCL 4 MG/2ML IJ SOLN: 4.0000 mg | Freq: Once | INTRAMUSCULAR | Status: DC | PRN

## 2021-05-24 MED ORDER — DEXMEDETOMIDINE (PRECEDEX) IN NS 20 MCG/5ML (4 MCG/ML) IV SYRINGE
PREFILLED_SYRINGE | INTRAVENOUS | Status: DC | PRN
  Administered 2021-05-24: 4 ug via INTRAVENOUS

## 2021-05-24 MED ORDER — LIDOCAINE 2% (20 MG/ML) 5 ML SYRINGE
INTRAMUSCULAR | Status: DC | PRN
  Administered 2021-05-24: 100 mg via INTRAVENOUS

## 2021-05-24 MED ORDER — DEXAMETHASONE SODIUM PHOSPHATE 10 MG/ML IJ SOLN
INTRAMUSCULAR | Status: AC
  Filled 2021-05-24: qty 1

## 2021-05-24 MED ORDER — LACTATED RINGERS IV SOLN: INTRAVENOUS | Status: DC

## 2021-05-24 MED ORDER — SUGAMMADEX SODIUM 200 MG/2ML IV SOLN
INTRAVENOUS | Status: DC | PRN
  Administered 2021-05-24: 200 mg via INTRAVENOUS

## 2021-05-24 MED ORDER — PROPOFOL 10 MG/ML IV BOLUS
INTRAVENOUS | Status: DC | PRN
  Administered 2021-05-24: 60 mg via INTRAVENOUS

## 2021-05-24 MED ORDER — LIDOCAINE-EPINEPHRINE 1 %-1:100000 IJ SOLN
INTRAMUSCULAR | Status: AC
  Filled 2021-05-24: qty 1

## 2021-05-24 MED ORDER — OXYCODONE HCL 5 MG PO TABS
5.0000 mg | ORAL_TABLET | Freq: Once | ORAL | Status: AC | PRN
  Administered 2021-05-24: 5 mg via ORAL

## 2021-05-24 MED ORDER — MIDAZOLAM HCL 5 MG/5ML IJ SOLN
INTRAMUSCULAR | Status: DC | PRN
Start: 2021-05-24 — End: 2021-05-24
  Administered 2021-05-24: 2 mg via INTRAVENOUS

## 2021-05-24 SURGICAL SUPPLY — 62 items
ADH SKN CLS APL DERMABOND .7 (GAUZE/BANDAGES/DRESSINGS)
APL PRP STRL LF DISP 70% ISPRP (MISCELLANEOUS)
APL SKNCLS STERI-STRIP NONHPOA (GAUZE/BANDAGES/DRESSINGS)
BAG COUNTER SPONGE SURGICOUNT (BAG) ×1 IMPLANT
BAND INSRT 18 STRL LF DISP RB (MISCELLANEOUS)
BAND RUBBER #18 3X1/16 STRL (MISCELLANEOUS) IMPLANT
BENZOIN TINCTURE PRP APPL 2/3 (GAUZE/BANDAGES/DRESSINGS) IMPLANT
BLADE CLIPPER SURG (BLADE) ×1 IMPLANT
BLADE SURG 11 STRL SS (BLADE) IMPLANT
BLADE SURG 15 STRL LF DISP TIS (BLADE) ×1 IMPLANT
BLADE SURG 15 STRL SS (BLADE) ×2
CANISTER SUCT 1200ML W/VALVE (MISCELLANEOUS) ×1 IMPLANT
CHLORAPREP W/TINT 26 (MISCELLANEOUS) ×1 IMPLANT
COVER BACK TABLE 60X90IN (DRAPES) ×2 IMPLANT
COVER MAYO STAND STRL (DRAPES) ×1 IMPLANT
DERMABOND ADVANCED (GAUZE/BANDAGES/DRESSINGS)
DERMABOND ADVANCED .7 DNX12 (GAUZE/BANDAGES/DRESSINGS) IMPLANT
DRAIN SNY 10 ROU (WOUND CARE) IMPLANT
DRAPE LAPAROTOMY 100X72 PEDS (DRAPES) IMPLANT
DRAPE U-SHAPE 76X120 STRL (DRAPES) ×1 IMPLANT
ELECT COATED BLADE 2.86 ST (ELECTRODE) IMPLANT
ELECT NDL BLADE 2-5/6 (NEEDLE) ×1 IMPLANT
ELECT NEEDLE BLADE 2-5/6 (NEEDLE) ×2 IMPLANT
ELECT REM PT RETURN 9FT ADLT (ELECTROSURGICAL) ×2
ELECT REM PT RETURN 9FT PED (ELECTROSURGICAL)
ELECTRODE REM PT RETRN 9FT PED (ELECTROSURGICAL) IMPLANT
ELECTRODE REM PT RTRN 9FT ADLT (ELECTROSURGICAL) IMPLANT
EVACUATOR SILICONE 100CC (DRAIN) IMPLANT
GAUZE SPONGE 2X2 8PLY STRL LF (GAUZE/BANDAGES/DRESSINGS) IMPLANT
GAUZE SPONGE 4X4 12PLY STRL LF (GAUZE/BANDAGES/DRESSINGS) IMPLANT
GAUZE XEROFORM 1X8 LF (GAUZE/BANDAGES/DRESSINGS) IMPLANT
GLOVE SURG ENC MOIS LTX SZ6 (GLOVE) ×2 IMPLANT
GOWN STRL REUS W/ TWL LRG LVL3 (GOWN DISPOSABLE) ×2 IMPLANT
GOWN STRL REUS W/TWL LRG LVL3 (GOWN DISPOSABLE) ×4
KIT BASIN OR (CUSTOM PROCEDURE TRAY) ×2 IMPLANT
NDL HYPO 30X.5 LL (NEEDLE) IMPLANT
NDL PRECISIONGLIDE 27X1.5 (NEEDLE) ×1 IMPLANT
NEEDLE HYPO 30X.5 LL (NEEDLE) IMPLANT
NEEDLE PRECISIONGLIDE 27X1.5 (NEEDLE) ×2 IMPLANT
NS IRRIG 1000ML POUR BTL (IV SOLUTION) ×1 IMPLANT
PENCIL BUTTON HOLSTER BLD 10FT (ELECTRODE) ×1 IMPLANT
SHEET MEDIUM DRAPE 40X70 STRL (DRAPES) IMPLANT
SPONGE GAUZE 2X2 STER 10/PKG (GAUZE/BANDAGES/DRESSINGS)
SPONGE T-LAP 18X18 ~~LOC~~+RFID (SPONGE) ×1 IMPLANT
STRIP CLOSURE SKIN 1/2X4 (GAUZE/BANDAGES/DRESSINGS) IMPLANT
SUCTION FRAZIER HANDLE 10FR (MISCELLANEOUS)
SUCTION TUBE FRAZIER 10FR DISP (MISCELLANEOUS) IMPLANT
SUT ETHILON 4 0 PS 2 18 (SUTURE) IMPLANT
SUT MNCRL AB 4-0 PS2 18 (SUTURE) ×1 IMPLANT
SUT MON AB 3-0 SH 27 (SUTURE) ×2
SUT MON AB 3-0 SH27 (SUTURE) IMPLANT
SUT MON AB 5-0 P3 18 (SUTURE) IMPLANT
SUT PLAIN 5 0 P 3 18 (SUTURE) IMPLANT
SUT PROLENE 5 0 P 3 (SUTURE) IMPLANT
SUT PROLENE 5 0 PS 2 (SUTURE) ×1 IMPLANT
SUT PROLENE 6 0 P 1 18 (SUTURE) IMPLANT
SUT VICRYL 4-0 PS2 18IN ABS (SUTURE) IMPLANT
SYR BULB EAR ULCER 3OZ GRN STR (SYRINGE) IMPLANT
SYR CONTROL 10ML LL (SYRINGE) IMPLANT
TOWEL GREEN STERILE FF (TOWEL DISPOSABLE) ×2 IMPLANT
TRAY ENT MC OR (CUSTOM PROCEDURE TRAY) ×2 IMPLANT
TUBE CONNECTING 20X1/4 (TUBING) IMPLANT

## 2021-05-24 NOTE — Anesthesia Postprocedure Evaluation (Signed)
Anesthesia Post Note  Patient: Tyler Alvarez  Procedure(s) Performed: EXCISION BENIGN LESION NECK 9CM, LAYERED CLOSURE NECK 10CM (Left: Neck)     Patient location during evaluation: PACU Anesthesia Type: General Level of consciousness: awake and alert Pain management: pain level controlled Vital Signs Assessment: post-procedure vital signs reviewed and stable Respiratory status: spontaneous breathing, nonlabored ventilation, respiratory function stable and patient connected to nasal cannula oxygen Cardiovascular status: blood pressure returned to baseline and stable Postop Assessment: no apparent nausea or vomiting Anesthetic complications: no   No notable events documented.  Last Vitals:  Vitals:   05/24/21 1347 05/24/21 1402  BP: (!) 97/59 102/65  Pulse: 85 81  Resp: 14 15  Temp:  36.9 C  SpO2: 91% 92%    Last Pain:  Vitals:   05/24/21 1402  TempSrc:   PainSc: [place]

## 2021-05-24 NOTE — Transfer of Care (Signed)
Immediate Anesthesia Transfer of Care Note  Patient: Tyler Alvarez  Procedure(s) Performed: EXCISION BENIGN LESION NECK 9CM, LAYERED CLOSURE NECK 10CM (Left: Neck)  Patient Location: PACU  Anesthesia Type:General  Level of Consciousness: drowsy and patient cooperative  Airway & Oxygen Therapy: Patient Spontanous Breathing  Post-op Assessment: Report given to RN and Post -op Vital signs reviewed and stable  Post vital signs: Reviewed and stable  Last Vitals:  Vitals Value Taken Time  BP 90/56 05/24/21 1302  Temp 36.5 C 05/24/21 1302  Pulse 111 05/24/21 1305  Resp 15 05/24/21 1305  SpO2 92 % 05/24/21 1305  Vitals shown include unvalidated device data.  Last Pain:  Vitals:   05/24/21 1302  TempSrc:   PainSc: 3          Complications: No notable events documented.

## 2021-05-24 NOTE — Op Note (Signed)
Operative Note   DATE OF OPERATION: 1.10.2023  LOCATION: [facility]-outpatient  SURGICAL DIVISION: Plastic Surgery  PREOPERATIVE DIAGNOSES:  1. Tubeous sclerosis 2. Sebaceous cysts left neck  POSTOPERATIVE DIAGNOSES:  same  PROCEDURE: 1. Excision left neck benign lesion 9 cm 2. Excision left neck benign lesion 9 cm 3. Layered closure neck  188 cm  SURGEON: Irene Limbo MD MBA  ASSISTANT: none  ANESTHESIA:  General.   EBL: < 10 ml  COMPLICATIONS: None immediate.   INDICATIONS FOR PROCEDURE:  The patient, Tyler Alvarez, is a 46 y.o. male born on [birth year], is here for excision multiple benign skin lesions in setting of tuberous sclerosis.   FINDINGS: Cysts consistent with underlying tuberous sclerosis.  DESCRIPTION OF PROCEDURE:  The patient's operative sites were marked with the patient in the preoperative area. The patient was taken to the operating room. SCDs were placed and IV antibiotics were given. The patient's operative site was prepped and draped in a sterile fashion. A time out was performed and all information was confirmed to be correct. Local anesthetic infiltrated surrounding lesions. Elliptical skin incision completed over posterior left neck mass and skin flaps elevated off underlying cystic masses. Multiple smaller cysts noted adjacent to largest mass.This was excised from underlying facial musculature, diameter 9 cm. Elliptical incision skin overlying left anterior neck mass completed and skin flaps elevated off underlying cystic mass. Mass excised diameter 9 cm. Wound irrigated and hemostasis ensured. Additional local anesthetic infiltrated in base wound. Closure neck lesions completed with 4-0 and 3-0 monocryl in dermis, short running 5-0 prolene skin closure, total length closure 18 cm. Antibiotic ointment applied.  The patient was allowed to wake from anesthesia, extubated and taken to the recovery room in satisfactory condition.   SPECIMENS: Left necks  cysts  DRAINS: none  Irene Limbo, MD [facility] Plastic [facility] Reconstructive Surgery

## 2021-05-24 NOTE — Anesthesia Procedure Notes (Signed)
Procedure Name: Intubation Date/Time: 05/24/2021 11:44 AM Performed by: Gwyndolyn Saxon, CRNA Pre-anesthesia Checklist: Patient identified, Emergency Drugs available, Suction available and Patient being monitored Patient Re-evaluated:Patient Re-evaluated prior to induction Oxygen Delivery Method: Circle system utilized Preoxygenation: Pre-oxygenation with 100% oxygen Induction Type: IV induction and Cricoid Pressure applied Ventilation: Mask ventilation without difficulty Laryngoscope Size: Glidescope and 3 Grade View: Grade I Tube type: Oral Tube size: 8.0 mm Number of attempts: 1 Airway Equipment and Method: Rigid stylet Placement Confirmation: ETT inserted through vocal cords under direct vision, positive ETCO2 and breath sounds checked- equal and bilateral Secured at: 23 cm Tube secured with: Tape Dental Injury: Teeth and Oropharynx as per pre-operative assessment  Comments: Poor dentition prior to induction; all dentition intact after intubation

## 2021-05-24 NOTE — Interval H&P Note (Signed)
History and Physical Interval Note:  05/24/2021 11:24 AM  Tyler Alvarez  has presented today for surgery, with the diagnosis of Left cyst neck, tuberous sclerosis.  The various methods of treatment have been discussed with the patient and family. After consideration of risks, benefits and other options for treatment, the patient has consented to  Procedure(s): EXCISION BENIGN LESION NECK 9CM, LAYERED CLOSURE NECK 10CM (N/A) as a surgical intervention.  The patient's history has been reviewed, patient examined, no change in status, stable for surgery.  I have reviewed the patient's chart and labs.  Questions were answered to the patient's satisfaction.     Arnoldo Hooker Nyeshia Mysliwiec

## 2021-05-25 ENCOUNTER — Encounter (HOSPITAL_COMMUNITY): Payer: Self-pay | Admitting: Plastic Surgery

## 2021-05-25 LAB — SURGICAL PATHOLOGY

## 2021-05-30 ENCOUNTER — Telehealth: Payer: Self-pay

## 2021-05-30 NOTE — Telephone Encounter (Signed)
Mother calls nurse line reporting difficulties picking up Depakote prescription sent on 1/6. I called the pharmacy and insurance will not cover as it is too soon to fill.   Mother reports he is taking #2 tablets at bedtime vs #1 prescription is written for, which is why he ran out early.   Will forward to PCP to send in with direction taking #2 at bedtime. Insurance should cover with change in directions.

## 2021-05-31 ENCOUNTER — Other Ambulatory Visit: Payer: Self-pay | Admitting: Family Medicine

## 2021-05-31 DIAGNOSIS — Z87898 Personal history of other specified conditions: Secondary | ICD-10-CM

## 2021-05-31 DIAGNOSIS — Q851 Tuberous sclerosis: Secondary | ICD-10-CM

## 2021-05-31 MED ORDER — DIVALPROEX SODIUM 250 MG PO DR TAB: 250.0000 mg | DELAYED_RELEASE_TABLET | Freq: Every day | ORAL | 0 refills | Status: DC

## 2021-06-01 NOTE — Telephone Encounter (Signed)
Spoke with patients mother. She informed me that her son has been taking DEPAKOTE for 25 years doing 2 tables at bedtime. She does not know why or when this change to 1 tablet at bedtime was made, and wants it changed back to the 2 tablets at bedtime.  Salvatore Marvel, CMA

## 2021-06-03 NOTE — Telephone Encounter (Signed)
Spoke with mother of patient. Made appt for Medication Management on 2/6 at 3:50. Salvatore Marvel, CMA

## 2021-06-06 NOTE — Telephone Encounter (Signed)
Patient has appt on 2/6. Salvatore Marvel, CMA

## 2021-06-10 ENCOUNTER — Other Ambulatory Visit: Payer: Self-pay | Admitting: Family Medicine

## 2021-06-15 DIAGNOSIS — Z940 Kidney transplant status: Principal | ICD-10-CM

## 2021-06-15 DIAGNOSIS — Q851 Tuberous sclerosis: Secondary | ICD-10-CM | POA: Diagnosis not present

## 2021-06-15 MED ORDER — PROGRAF 1 MG CAPSULE
ORAL_CAPSULE | Freq: Two times a day (BID) | ORAL | 3 refills | 90 days
Start: 2021-06-15 — End: 2022-06-15

## 2021-06-15 MED ORDER — PREDNISONE 5 MG TABLET
ORAL_TABLET | Freq: Every day | ORAL | 3 refills | 90.00000 days
Start: 2021-06-15 — End: 2022-06-15

## 2021-06-15 NOTE — Unmapped (Signed)
[facility] Clinical Assessment & Refill Coordination Note    Oscar [name], DOB: 04-29-1976  Phone: [phone] (home)     All above HIPAA information was verified with patient's family member, mom.     Was a Nurse, learning disability used for this call? No    Specialty Medication(s):   Transplant: Prograf 1mg  and Prednisone 5mg      Current Outpatient Medications   Medication Sig Dispense Refill   ??? divalproex (DEPAKOTE) 250 MG DR tablet TAKE 1 TABLET BY MOUTH 2 TIMES DAILY. ONLY TAKES 1 PER DAY 180 tablet 3   ??? gabapentin (NEURONTIN) 300 MG capsule Take 1 capsule (300 mg total) by mouth nightly. 90 capsule 3   ??? imipramine (TOFRANIL) 25 MG tablet TAKE 2 TABLETS BY MOUTH NIGHTLY 180 tablet 3   ??? losartan (COZAAR) 25 MG tablet Take 1 tablet (25 mg total) by mouth Two (2) times a day. 180 tablet 3   ??? predniSONE (DELTASONE) 5 MG tablet Take 1 tablet (5 mg total) by mouth daily. 90 tablet 3   ??? PROGRAF 1 mg capsule Take 2 capsules (2 mg total) by mouth two (2) times a day. 360 capsule 3     No current facility-administered medications for this visit.        Changes to medications: Bonnie reports no changes at this time.    Allergies   Allergen Reactions   ??? Erythromycin    ??? Erythromycin Base      Other reaction(s): NAUSEA   ??? Sulfa (Sulfonamide Antibiotics)      Other reaction(s): ITCHING   ??? Sulfacetamide Sodium        Changes to allergies: No    SPECIALTY MEDICATION ADHERENCE     Prograf 1mg   : 12 days of medicine on hand   Prednisone 5mg   : 12 days of medicine on hand       Medication Adherence    Patient reported X missed doses in the last month: 0  Specialty Medication: prednisone 5mg   Patient is on additional specialty medications: Yes  Additional Specialty Medications: Prograf 1mg   Patient Reported Additional Medication X Missed Doses in the Last Month: 0  Adherence tools used: patient uses a pill box to manage medications  Support network for adherence: family member Specialty medication(s) dose(s) confirmed: Regimen is correct and unchanged.     Are there any concerns with adherence? No    Adherence counseling provided? Not needed    CLINICAL MANAGEMENT AND INTERVENTION      Clinical Benefit Assessment:    Do you feel the medicine is effective or helping your condition? Yes    Clinical Benefit counseling provided? Not needed    Adverse Effects Assessment:    Are you experiencing any side effects? No    Are you experiencing difficulty administering your medicine? No    Quality of Life Assessment:    How many days over the past month did your transplant  keep you from your normal activities? For example, brushing your teeth or getting up in the morning. 0    Have you discussed this with your provider? Not needed    Acute Infection Status:    Acute infections noted within Epic:  No active infections  Patient reported infection: None    Therapy Appropriateness:    Is therapy appropriate and patient progressing towards therapeutic goals? Yes, therapy is appropriate and should be continued    DISEASE/MEDICATION-SPECIFIC INFORMATION      N/A  PATIENT SPECIFIC NEEDS     - Does the patient have any physical, cognitive, or cultural barriers? No    - Is the patient high risk? No    - Does the patient require a Care Management Plan? No       SHIPPING     Specialty Medication(s) to be Shipped:   Transplant: Prograf 1mg  and Prednisone 5mg     Other medication(s) to be shipped: No additional medications requested for fill at this time     Changes to insurance: No    Delivery Scheduled: Yes, Expected medication delivery date: 06/23/2021.  However, Rx request for refills was sent to the provider as there are none remaining.     Medication will be delivered via UPS to the confirmed prescription address in [facility].    The patient will receive a drug information handout for each medication shipped and additional FDA Medication Guides as required.  Verified that patient has previously received a Conservation officer, historic buildings and a Surveyor, mining.    The patient or caregiver noted above participated in the development of this care plan and knows that they can request review of or adjustments to the care plan at any time.      All of the patient's questions and concerns have been addressed.    Thad Ranger   [facility] Specialty Pharmacist

## 2021-06-20 ENCOUNTER — Ambulatory Visit (INDEPENDENT_AMBULATORY_CARE_PROVIDER_SITE_OTHER): Payer: Medicare Other | Admitting: Family Medicine

## 2021-06-20 ENCOUNTER — Encounter: Payer: Self-pay | Admitting: Family Medicine

## 2021-06-20 ENCOUNTER — Other Ambulatory Visit: Payer: Self-pay

## 2021-06-20 VITALS — BP 130/92 | HR 100 | Ht 72.0 in | Wt 128.6 lb

## 2021-06-20 DIAGNOSIS — Z87898 Personal history of other specified conditions: Secondary | ICD-10-CM

## 2021-06-20 MED ORDER — DIVALPROEX SODIUM 250 MG PO DR TAB: 250.0000 mg | DELAYED_RELEASE_TABLET | Freq: Every day | ORAL | 0 refills | Status: DC

## 2021-06-20 NOTE — Assessment & Plan Note (Signed)
-  hesitant to return to original dose as patient was instructed to take one pill and has been doing so since October without further seizures -refill for depakote once daily prescribed -pending valproic acid level, will adjust dose accordingly, if therapeutic will remain on this dose and if not then will consider neurology referral for further recommendations for adjustment -discussed this extensively with patient as we do not want to cause any harm, both patient and mother express understanding to current plan in place

## 2021-06-20 NOTE — Patient Instructions (Signed)
It was great seeing you today!  Today we discussed your seizure management. I have provided refills for your depakote, please take this once daily. We will get a depakote level today, I will let you know of the results.   Please follow up at your next scheduled appointment, if anything arises between now and then, please don't hesitate to contact our office.   Thank you for allowing [place] to be a part of your medical care!  Thank you, Dr. Larae Grooms

## 2021-06-20 NOTE — Progress Notes (Signed)
° ° °  SUBJECTIVE:   CHIEF COMPLAINT / HPI:   Patient presents with history of seizures and tuberous sclerosis presents for medication management regarding his depakote, he is accompanied by his mother. Last seizure was at the age of 46 years old, maintained routine neurology follow up at that time but has not been seen in a few years as he had been stable on his dose until recently. Patient currently does not follow up with neurology. Original depakote dose of twice daily but patient reports changing those dosage to once daily in October 2022 as he felt like he could not take more than this. In October he was hospitalized and since discharge was instructed to take one pill daily. Today he wants to go back to his original dose and requests original prescription refill. He denies any seizures since childhood. He is currently out of his depakote.   OBJECTIVE:   BP (!) 130/92    Pulse 100    Ht 6' (1.829 m)    Wt 128 lb 9.6 oz (58.3 kg)    SpO2 100%    BMI 17.44 kg/m   General: Patient well-appearing, in no acute distress. CV: RRR, no murmurs or gallops auscultated Resp: CTAB, no wheezing, rales or rhonchi noted  ASSESSMENT/PLAN:   History of seizures -hesitant to return to original dose as patient was instructed to take one pill and has been doing so since October without further seizures -refill for depakote once daily prescribed -pending valproic acid level, will adjust dose accordingly, if therapeutic will remain on this dose and if not then will consider neurology referral for further recommendations for adjustment -discussed this extensively with patient as we do not want to cause any harm, both patient and mother express understanding to current plan in place    PHQ-9 score of 0 reviewed.   Donney Dice, [place]

## 2021-06-21 ENCOUNTER — Other Ambulatory Visit: Payer: Self-pay | Admitting: Family Medicine

## 2021-06-21 DIAGNOSIS — Z87898 Personal history of other specified conditions: Secondary | ICD-10-CM

## 2021-06-21 LAB — VALPROIC ACID LEVEL: Valproic Acid Lvl: 4 ug/mL — ABNORMAL LOW (ref 50–100)

## 2021-06-21 MED ORDER — DIVALPROEX SODIUM 250 MG PO DR TAB: 250.0000 mg | DELAYED_RELEASE_TABLET | Freq: Two times a day (BID) | ORAL | 0 refills | Status: DC

## 2021-06-22 DIAGNOSIS — Z940 Kidney transplant status: Principal | ICD-10-CM

## 2021-06-22 MED ORDER — PROGRAF 1 MG CAPSULE
ORAL_CAPSULE | Freq: Two times a day (BID) | ORAL | 3 refills | 90 days | Status: CP
Start: 2021-06-22 — End: 2022-06-22
  Filled 2021-06-22: qty 360, 90d supply, fill #0

## 2021-06-22 MED ORDER — PREDNISONE 5 MG TABLET
ORAL_TABLET | Freq: Every day | ORAL | 3 refills | 90 days | Status: CP
Start: 2021-06-22 — End: 2022-06-22
  Filled 2021-06-22: qty 90, 90d supply, fill #0

## 2021-06-27 DIAGNOSIS — D2322 Other benign neoplasm of skin of left ear and external auricular canal: Secondary | ICD-10-CM | POA: Diagnosis not present

## 2021-07-15 NOTE — Unmapped (Signed)
Patient sent 90 supply 06/22/21. Adjusting refill call date.

## 2021-07-26 DIAGNOSIS — Z940 Kidney transplant status: Secondary | ICD-10-CM | POA: Diagnosis not present

## 2021-07-26 DIAGNOSIS — I517 Cardiomegaly: Secondary | ICD-10-CM | POA: Diagnosis not present

## 2021-07-26 DIAGNOSIS — L02429 Furuncle of limb, unspecified: Secondary | ICD-10-CM | POA: Diagnosis not present

## 2021-07-26 DIAGNOSIS — E559 Vitamin D deficiency, unspecified: Secondary | ICD-10-CM | POA: Diagnosis not present

## 2021-07-26 DIAGNOSIS — D849 Immunodeficiency, unspecified: Secondary | ICD-10-CM | POA: Diagnosis not present

## 2021-07-26 DIAGNOSIS — I7121 Aneurysm of the ascending aorta, without rupture: Secondary | ICD-10-CM | POA: Diagnosis not present

## 2021-07-26 DIAGNOSIS — N2581 Secondary hyperparathyroidism of renal origin: Secondary | ICD-10-CM | POA: Diagnosis not present

## 2021-07-26 DIAGNOSIS — I5189 Other ill-defined heart diseases: Secondary | ICD-10-CM | POA: Diagnosis not present

## 2021-07-26 DIAGNOSIS — I10 Essential (primary) hypertension: Secondary | ICD-10-CM | POA: Diagnosis not present

## 2021-08-12 MED ORDER — IMIPRAMINE 25 MG TABLET
ORAL_TABLET | 3 refills | 0 days
Start: 2021-08-12 — End: ?

## 2021-08-18 MED ORDER — IMIPRAMINE 25 MG TABLET
ORAL_TABLET | Freq: Every evening | ORAL | 3 refills | 90 days | Status: CP
Start: 2021-08-18 — End: ?

## 2021-09-01 ENCOUNTER — Other Ambulatory Visit: Payer: Self-pay | Admitting: Family Medicine

## 2021-09-01 DIAGNOSIS — Z87898 Personal history of other specified conditions: Secondary | ICD-10-CM

## 2021-09-08 DIAGNOSIS — Z940 Kidney transplant status: Secondary | ICD-10-CM | POA: Diagnosis not present

## 2021-09-09 NOTE — Unmapped (Signed)
[facility] Refill Coordination Note    Specialty Medication(s) to be Shipped:   Transplant: Prograf 1mg  and Prednisone 5mg     Other medication(s) to be shipped: No additional medications requested for fill at this time     Oscar [name], DOB: 08-17-1975  Phone: [phone] (home)       All above HIPAA information was verified with patient's caregiver, Oscar [name])      Was a Nurse, learning disability used for this call? No    Completed refill call assessment today to schedule patient's medication shipment from the [facility] [phone]).  All relevant notes have been reviewed.     Specialty medication(s) and dose(s) confirmed: Regimen is correct and unchanged.   Changes to medications: Oscar [name] reports no changes at this time.  Changes to insurance: No  New side effects reported not previously addressed with a pharmacist or physician: None reported  Questions for the pharmacist: No    Confirmed patient received a Conservation officer, historic buildings and a Surveyor, mining with first shipment. The patient will receive a drug information handout for each medication shipped and additional FDA Medication Guides as required.       DISEASE/MEDICATION-SPECIFIC INFORMATION        N/A    SPECIALTY MEDICATION ADHERENCE     Medication Adherence    Patient reported X missed doses in the last month: 0  Specialty Medication: predniSONE 5 MG tablet (DELTASONE)  Patient is on additional specialty medications: Yes  Additional Specialty Medications: PROGRAF 1 MG capsule (tacrolimus)  Patient Reported Additional Medication X Missed Doses in the Last Month: 0  Adherence tools used: patient uses a pill box to manage medications  Support network for adherence: family member       Were doses missed due to medication being on hold? No    Prograf 1 mg: 10 days of medicine on hand   Prednisone 5 mg: 10 days of medicine on hand      REFERRAL TO PHARMACIST     Referral to the pharmacist: Not needed      [facility] Shipping address confirmed in Epic.     Delivery Scheduled: Yes, Expected medication delivery date: 09/14/21.     Medication will be delivered via UPS to the prescription address in Epic WAM.    Oscar [name]   [facility] Specialty Technician

## 2021-09-13 MED FILL — PREDNISONE 5 MG TABLET: ORAL | 90 days supply | Qty: 90 | Fill #1

## 2021-09-13 MED FILL — PROGRAF 1 MG CAPSULE: ORAL | 90 days supply | Qty: 360 | Fill #1

## 2021-09-15 ENCOUNTER — Other Ambulatory Visit: Payer: Self-pay | Admitting: Family Medicine

## 2021-09-15 DIAGNOSIS — Z87898 Personal history of other specified conditions: Secondary | ICD-10-CM

## 2021-09-16 ENCOUNTER — Other Ambulatory Visit: Payer: Self-pay | Admitting: Family Medicine

## 2021-09-16 DIAGNOSIS — Z87898 Personal history of other specified conditions: Secondary | ICD-10-CM

## 2021-09-23 IMAGING — DX DG CHEST 1V PORT
1 series · 1 of 1 positions shown · non-contrast
Comparison: September 06, 2019

CLINICAL DATA: Cough.

EXAM:
PORTABLE CHEST 1 VIEW

[chest ap]
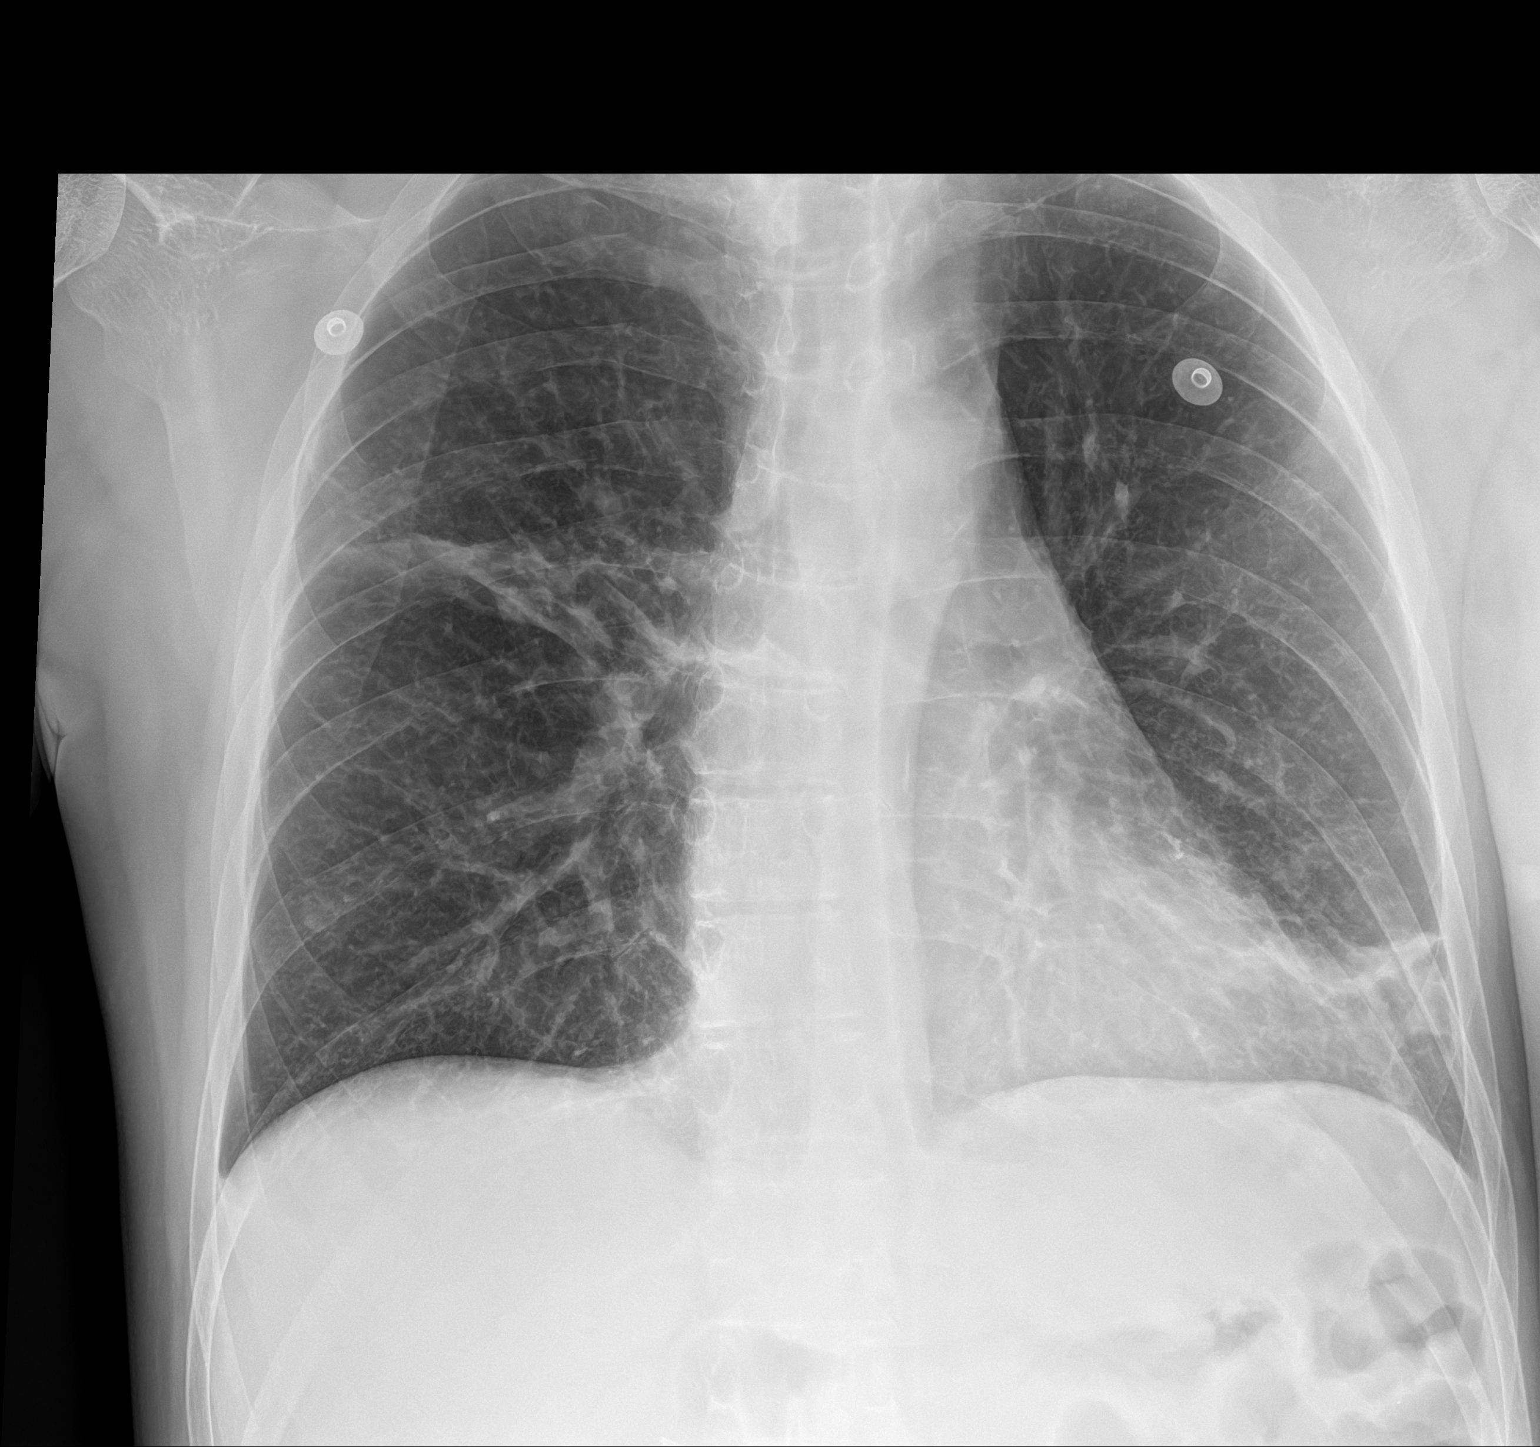

[1 of 1 positions shown; findings below may reference images not displayed]

FINDINGS: Infiltrates are seen in the right perihilar region and left base.
The opacity in the right perihilar region is somewhat platelike. The
heart, hila, mediastinum, lungs, and pleura are otherwise
unremarkable.
IMPRESSION: Right perihilar and left basilar infiltrates are worrisome for
pneumonia given history. The right perihilar opacity is somewhat
platelike in atelectasis is a possibility as well.

## 2021-11-08 DIAGNOSIS — R799 Abnormal finding of blood chemistry, unspecified: Secondary | ICD-10-CM | POA: Diagnosis not present

## 2021-11-08 DIAGNOSIS — N2581 Secondary hyperparathyroidism of renal origin: Secondary | ICD-10-CM | POA: Diagnosis not present

## 2021-11-08 DIAGNOSIS — I7121 Aneurysm of the ascending aorta, without rupture: Secondary | ICD-10-CM | POA: Diagnosis not present

## 2021-11-08 DIAGNOSIS — I517 Cardiomegaly: Secondary | ICD-10-CM | POA: Diagnosis not present

## 2021-11-08 DIAGNOSIS — Z940 Kidney transplant status: Secondary | ICD-10-CM | POA: Diagnosis not present

## 2021-11-08 DIAGNOSIS — D849 Immunodeficiency, unspecified: Secondary | ICD-10-CM | POA: Diagnosis not present

## 2021-11-08 DIAGNOSIS — I5189 Other ill-defined heart diseases: Secondary | ICD-10-CM | POA: Diagnosis not present

## 2021-11-08 DIAGNOSIS — I10 Essential (primary) hypertension: Secondary | ICD-10-CM | POA: Diagnosis not present

## 2021-11-08 DIAGNOSIS — E559 Vitamin D deficiency, unspecified: Secondary | ICD-10-CM | POA: Diagnosis not present

## 2021-11-10 MED ORDER — IMIPRAMINE 25 MG TABLET
ORAL_TABLET | 3 refills | 0 days
Start: 2021-11-10 — End: ?

## 2021-12-07 NOTE — Unmapped (Signed)
[facility] Clinical Assessment & Refill Coordination Note    Oscar [name], DOB: June 28, 1975  Phone: [phone] (home)     All above HIPAA information was verified with patient's family member, mom.     Was a Nurse, learning disability used for this call? No    Specialty Medication(s):   Transplant: Prograf 1mg  and Prednisone 5mg      Current Outpatient Medications   Medication Sig Dispense Refill   ??? divalproex (DEPAKOTE) 250 MG DR tablet TAKE 1 TABLET BY MOUTH 2 TIMES DAILY. ONLY TAKES 1 PER DAY 180 tablet 3   ??? gabapentin (NEURONTIN) 300 MG capsule Take 1 capsule (300 mg total) by mouth nightly. 90 capsule 3   ??? imipramine (TOFRANIL) 25 MG tablet Take 2 tablets (50 mg total) by mouth nightly. 180 tablet 3   ??? losartan (COZAAR) 25 MG tablet Take 1 tablet (25 mg total) by mouth Two (2) times a day. 180 tablet 3   ??? predniSONE (DELTASONE) 5 MG tablet Take 1 tablet (5 mg total) by mouth daily. 90 tablet 3   ??? PROGRAF 1 mg capsule Take 2 capsules (2 mg total) by mouth two (2) times a day. 360 capsule 3     No current facility-administered medications for this visit.        Changes to medications: Danyel reports no changes at this time.    Allergies   Allergen Reactions   ??? Erythromycin    ??? Erythromycin Base      Other reaction(s): NAUSEA   ??? Sulfa (Sulfonamide Antibiotics)      Other reaction(s): ITCHING   ??? Sulfacetamide Sodium        Changes to allergies: No    SPECIALTY MEDICATION ADHERENCE     Prograf 1mg   : 10 days of medicine on hand   Prednisone 5mg   : 10 days of medicine on hand       Medication Adherence    Patient reported X missed doses in the last month: 0  Specialty Medication: prograf 1mg   Patient is on additional specialty medications: Yes  Additional Specialty Medications: Prednisone 5mg   Patient Reported Additional Medication X Missed Doses in the Last Month: 0  Adherence tools used: patient uses a pill box to manage medications  Support network for adherence: family member          Specialty medication(s) dose(s) confirmed: Regimen is correct and unchanged.     Are there any concerns with adherence? No    Adherence counseling provided? Not needed    CLINICAL MANAGEMENT AND INTERVENTION      Clinical Benefit Assessment:    Do you feel the medicine is effective or helping your condition? Yes    Clinical Benefit counseling provided? Not needed    Adverse Effects Assessment:    Are you experiencing any side effects? No    Are you experiencing difficulty administering your medicine? No    Quality of Life Assessment:         How many days over the past month did your tranpslant  keep you from your normal activities? For example, brushing your teeth or getting up in the morning. 0    Have you discussed this with your provider? Not needed    Acute Infection Status:    Acute infections noted within Epic:  No active infections  Patient reported infection: None    Therapy Appropriateness:    Is therapy appropriate and patient progressing towards therapeutic goals? Yes, therapy is appropriate  and should be continued    DISEASE/MEDICATION-SPECIFIC INFORMATION      N/A    PATIENT SPECIFIC NEEDS     - Does the patient have any physical, cognitive, or cultural barriers? No    - Is the patient high risk? No    - Does the patient require a Care Management Plan? No     SOCIAL DETERMINANTS OF HEALTH     At the [facility], we have learned that life circumstances - like trouble affording food, housing, utilities, or transportation can affect the health of many of our patients.   That is why we wanted to ask: are you currently experiencing any life circumstances that are negatively impacting your health and/or quality of life? No    Social Determinants of Psychologist, prison and probation services Strain: Not on file   Internet Connectivity: Not on file   Food Insecurity: Not on file   Tobacco Use: Low Risk    ??? Smoking Tobacco Use: Never   ??? Smokeless Tobacco Use: Never   ??? Passive Exposure: Not on file Housing/Utilities: Not on file   Alcohol Use: Not on file   Transportation Needs: Not on file   Substance Use: Not on file   Health Literacy: Not on file   Physical Activity: Not on file   Interpersonal Safety: Not on file   Stress: Not on file   Intimate Partner Violence: Not on file   Depression: Not on file   Social Connections: Not on file       Would you be willing to receive help with any of the needs that you have identified today? Not applicable       SHIPPING     Specialty Medication(s) to be Shipped:   Transplant: Prograf 1mg  and Prednisone 5mg     Other medication(s) to be shipped: No additional medications requested for fill at this time     Changes to insurance: No    Delivery Scheduled: Yes, Expected medication delivery date: 12/14/2021.     Medication will be delivered via UPS to the confirmed prescription address in [facility].    The patient will receive a drug information handout for each medication shipped and additional FDA Medication Guides as required.  Verified that patient has previously received a Conservation officer, historic buildings and a Surveyor, mining.    The patient or caregiver noted above participated in the development of this care plan and knows that they can request review of or adjustments to the care plan at any time.      All of the patient's questions and concerns have been addressed.    Thad Ranger   [facility] Specialty Pharmacist

## 2021-12-13 MED FILL — PROGRAF 1 MG CAPSULE: ORAL | 90 days supply | Qty: 360 | Fill #2

## 2021-12-13 MED FILL — PREDNISONE 5 MG TABLET: ORAL | 90 days supply | Qty: 90 | Fill #2

## 2021-12-16 ENCOUNTER — Other Ambulatory Visit: Payer: Self-pay | Admitting: Family Medicine

## 2021-12-20 DIAGNOSIS — H43812 Vitreous degeneration, left eye: Secondary | ICD-10-CM | POA: Diagnosis not present

## 2021-12-20 DIAGNOSIS — H52203 Unspecified astigmatism, bilateral: Secondary | ICD-10-CM | POA: Diagnosis not present

## 2022-01-11 DIAGNOSIS — Z940 Kidney transplant status: Secondary | ICD-10-CM | POA: Diagnosis not present

## 2022-01-16 ENCOUNTER — Other Ambulatory Visit: Payer: Self-pay | Admitting: Family Medicine

## 2022-02-03 NOTE — Unmapped (Signed)
11/08/21 Office note from [employer].

## 2022-02-20 DIAGNOSIS — D849 Immunodeficiency, unspecified: Secondary | ICD-10-CM | POA: Diagnosis not present

## 2022-02-20 DIAGNOSIS — I7121 Aneurysm of the ascending aorta, without rupture: Secondary | ICD-10-CM | POA: Diagnosis not present

## 2022-02-20 DIAGNOSIS — Z940 Kidney transplant status: Secondary | ICD-10-CM | POA: Diagnosis not present

## 2022-02-20 DIAGNOSIS — I517 Cardiomegaly: Secondary | ICD-10-CM | POA: Diagnosis not present

## 2022-02-20 DIAGNOSIS — I5189 Other ill-defined heart diseases: Secondary | ICD-10-CM | POA: Diagnosis not present

## 2022-02-20 DIAGNOSIS — N2581 Secondary hyperparathyroidism of renal origin: Secondary | ICD-10-CM | POA: Diagnosis not present

## 2022-02-20 DIAGNOSIS — I10 Essential (primary) hypertension: Secondary | ICD-10-CM | POA: Diagnosis not present

## 2022-02-20 DIAGNOSIS — E559 Vitamin D deficiency, unspecified: Secondary | ICD-10-CM | POA: Diagnosis not present

## 2022-02-23 DIAGNOSIS — Z940 Kidney transplant status: Principal | ICD-10-CM

## 2022-02-24 LAB — BASIC METABOLIC PANEL
BLOOD UREA NITROGEN: 34 mg/dL — ABNORMAL HIGH
CALCIUM: 9.6 mg/dL
CHLORIDE: 107 mmol/L
CO2: 25 mmol/L
CREATININE: 2.78 mg/dL — ABNORMAL HIGH
EGFR CKD-EPI NON-AA MALE: 27 mL/min/{1.73_m2} — ABNORMAL LOW
GLUCOSE RANDOM: 103 mg/dL — ABNORMAL HIGH
POTASSIUM: 4.5 mmol/L
SODIUM: 140 mmol/L

## 2022-02-24 LAB — CBC W/ DIFFERENTIAL
BASOPHILS ABSOLUTE COUNT: 91 10*9/L
BASOPHILS RELATIVE PERCENT: 0.9 %
EOSINOPHILS ABSOLUTE COUNT: 414 10*9/L
EOSINOPHILS RELATIVE PERCENT: 4.1 %
HEMATOCRIT: 36.4 % — ABNORMAL LOW
HEMOGLOBIN: 12.6 g/dL — ABNORMAL LOW
LYMPHOCYTES ABSOLUTE COUNT: 3070 10*9/L
LYMPHOCYTES RELATIVE PERCENT: 30.4 %
MEAN CORPUSCULAR HEMOGLOBIN CONC: 34.6 g/dL
MEAN CORPUSCULAR HEMOGLOBIN: 31 pg
MEAN CORPUSCULAR VOLUME: 89.7 fL
MEAN PLATELET VOLUME: 10.1 fL
MONOCYTES ABSOLUTE COUNT: 525 10*9/L
MONOCYTES RELATIVE PERCENT: 5.2 %
NEUTROPHILS ABSOLUTE COUNT: 5999 10*9/L
NEUTROPHILS RELATIVE PERCENT: 59.4 %
PLATELET COUNT: 242 10*9/L
RED BLOOD CELL COUNT: 4.05 10*12/L — ABNORMAL LOW
RED CELL DISTRIBUTION WIDTH: 12.4 %
WHITE BLOOD CELL COUNT: 10.1 10*9/L

## 2022-02-24 LAB — HEPATIC FUNCTION PANEL
ALKALINE PHOSPHATASE: 64 U/L
ALT (SGPT): 11 U/L
AST (SGOT): 9 U/L — ABNORMAL LOW
BILIRUBIN TOTAL: 0.4 mg/dL
PROTEIN TOTAL: 7.4 g/dL

## 2022-02-24 LAB — ALBUMIN: ALBUMIN: 4.3 g/dL

## 2022-02-24 LAB — TACROLIMUS LEVEL, TROUGH: TACROLIMUS, TROUGH: 7.1 ng/mL

## 2022-02-24 NOTE — Unmapped (Signed)
[facility] office note from 10.9.23 sent to HIM Suan Halter  February 24, 2022 10:19 AM

## 2022-02-28 NOTE — Unmapped (Signed)
UNOS form

## 2022-03-02 LAB — VITAMIN D 25 HYDROXY: VITAMIN D, TOTAL (25OH): 34 ng/mL

## 2022-03-06 NOTE — Unmapped (Signed)
[facility] Refill Coordination Note    Specialty Medication(s) to be Shipped:   Transplant: Prograf 1mg  and Prednisone 5mg     Other medication(s) to be shipped: No additional medications requested for fill at this time     Oscar [name], DOB: [birth year]  Phone: [phone] (home)       All above HIPAA information was verified with patient's family member, mom.     Was a Nurse, learning disability used for this call? No    Completed refill call assessment today to schedule patient's medication shipment from the [facility] [phone]).  All relevant notes have been reviewed.     Specialty medication(s) and dose(s) confirmed: Regimen is correct and unchanged.   Changes to medications: Hooman reports no changes at this time.  Changes to insurance: No  New side effects reported not previously addressed with a pharmacist or physician: None reported  Questions for the pharmacist: No    Confirmed patient received a Conservation officer, historic buildings and a Surveyor, mining with first shipment. The patient will receive a drug information handout for each medication shipped and additional FDA Medication Guides as required.       DISEASE/MEDICATION-SPECIFIC INFORMATION        N/A    SPECIALTY MEDICATION ADHERENCE     Medication Adherence    Patient reported X missed doses in the last month: 0  Specialty Medication: prednisone 5 mg  Patient is on additional specialty medications: Yes  Additional Specialty Medications: Prograf 1 mg  Patient Reported Additional Medication X Missed Doses in the Last Month: 0  Patient is on more than two specialty medications: No      Adherence tools used: patient uses a pill box to manage medications      Support network for adherence: family member                    Were doses missed due to medication being on hold? No    prednisone 5 mg: 12 days of medicine on hand   Prograf 1 mg: 12 days of medicine on hand       REFERRAL TO PHARMACIST     Referral to the pharmacist: Not needed      [facility]     Shipping address confirmed in Epic.     Delivery Scheduled: Yes, Expected medication delivery date: 03/17/22.     Medication will be delivered via UPS to the prescription address in Epic WAM.    Quintella Reichert   [facility] Specialty Technician

## 2022-03-16 MED FILL — PROGRAF 1 MG CAPSULE: ORAL | 90 days supply | Qty: 360 | Fill #3

## 2022-03-16 MED FILL — PREDNISONE 5 MG TABLET: ORAL | 90 days supply | Qty: 90 | Fill #3

## 2022-03-31 MED ORDER — AMLODIPINE 5 MG TABLET
ORAL_TABLET | 3 refills | 0 days
Start: 2022-03-31 — End: ?

## 2022-04-03 MED ORDER — AMLODIPINE 5 MG TABLET
ORAL_TABLET | 3 refills | 0 days | Status: CP
Start: 2022-04-03 — End: ?

## 2022-04-11 DIAGNOSIS — Z79899 Other long term (current) drug therapy: Principal | ICD-10-CM

## 2022-04-11 DIAGNOSIS — Z940 Kidney transplant status: Principal | ICD-10-CM

## 2022-04-12 ENCOUNTER — Ambulatory Visit: Admit: 2022-04-12 | Discharge: 2022-04-12 | Payer: MEDICARE

## 2022-04-12 ENCOUNTER — Ambulatory Visit: Admit: 2022-04-12 | Discharge: 2022-04-12 | Payer: MEDICARE | Attending: Nephrology | Primary: Nephrology

## 2022-04-12 DIAGNOSIS — Z79899 Other long term (current) drug therapy: Principal | ICD-10-CM

## 2022-04-12 DIAGNOSIS — M109 Gout, unspecified: Principal | ICD-10-CM

## 2022-04-12 DIAGNOSIS — Z940 Kidney transplant status: Principal | ICD-10-CM

## 2022-04-12 DIAGNOSIS — R809 Proteinuria, unspecified: Secondary | ICD-10-CM | POA: Diagnosis not present

## 2022-04-12 DIAGNOSIS — I7121 Aneurysm of the ascending aorta, without rupture: Secondary | ICD-10-CM | POA: Diagnosis not present

## 2022-04-12 DIAGNOSIS — I129 Hypertensive chronic kidney disease with stage 1 through stage 4 chronic kidney disease, or unspecified chronic kidney disease: Secondary | ICD-10-CM | POA: Diagnosis not present

## 2022-04-12 DIAGNOSIS — D849 Immunodeficiency, unspecified: Secondary | ICD-10-CM | POA: Diagnosis not present

## 2022-04-12 DIAGNOSIS — N1832 Chronic kidney disease, stage 3b: Secondary | ICD-10-CM | POA: Diagnosis not present

## 2022-04-12 DIAGNOSIS — Z23 Encounter for immunization: Secondary | ICD-10-CM | POA: Diagnosis not present

## 2022-04-12 DIAGNOSIS — I10 Essential (primary) hypertension: Secondary | ICD-10-CM | POA: Diagnosis not present

## 2022-04-12 DIAGNOSIS — G43909 Migraine, unspecified, not intractable, without status migrainosus: Secondary | ICD-10-CM | POA: Diagnosis not present

## 2022-04-12 LAB — COMPREHENSIVE METABOLIC PANEL
ALBUMIN: 3.3 g/dL — ABNORMAL LOW (ref 3.4–5.0)
ALKALINE PHOSPHATASE: 71 U/L (ref 46–116)
ALT (SGPT): 9 U/L — ABNORMAL LOW (ref 10–49)
ANION GAP: 11 mmol/L (ref 5–14)
AST (SGOT): 9 U/L (ref <=34)
BILIRUBIN TOTAL: 0.3 mg/dL (ref 0.3–1.2)
BLOOD UREA NITROGEN: 37 mg/dL — ABNORMAL HIGH (ref 9–23)
BUN / CREAT RATIO: 16
CALCIUM: 9.9 mg/dL (ref 8.7–10.4)
CHLORIDE: 107 mmol/L (ref 98–107)
CO2: 23.5 mmol/L (ref 20.0–31.0)
CREATININE: 2.28 mg/dL — ABNORMAL HIGH
EGFR CKD-EPI (2021) MALE: 35 mL/min/{1.73_m2} — ABNORMAL LOW (ref >=60)
GLUCOSE RANDOM: 110 mg/dL — ABNORMAL HIGH (ref 70–99)
POTASSIUM: 4.3 mmol/L (ref 3.4–4.8)
PROTEIN TOTAL: 6.7 g/dL (ref 5.7–8.2)
SODIUM: 141 mmol/L (ref 135–145)

## 2022-04-12 LAB — ALBUMIN / CREATININE URINE RATIO
ALBUMIN QUANT URINE: 38.1 mg/dL
ALBUMIN/CREATININE RATIO: 251.5 ug/mg — ABNORMAL HIGH (ref 0.0–30.0)
CREATININE, URINE: 151.5 mg/dL

## 2022-04-12 LAB — CBC W/ AUTO DIFF
BASOPHILS ABSOLUTE COUNT: 0.1 10*9/L (ref 0.0–0.1)
BASOPHILS RELATIVE PERCENT: 0.6 %
EOSINOPHILS ABSOLUTE COUNT: 0.5 10*9/L (ref 0.0–0.5)
EOSINOPHILS RELATIVE PERCENT: 3.8 %
HEMATOCRIT: 34.6 % — ABNORMAL LOW (ref 39.0–48.0)
HEMOGLOBIN: 11.9 g/dL — ABNORMAL LOW (ref 12.9–16.5)
LYMPHOCYTES ABSOLUTE COUNT: 3.6 10*9/L (ref 1.1–3.6)
LYMPHOCYTES RELATIVE PERCENT: 28.8 %
MEAN CORPUSCULAR HEMOGLOBIN CONC: 34.4 g/dL (ref 32.0–36.0)
MEAN CORPUSCULAR HEMOGLOBIN: 30.5 pg (ref 25.9–32.4)
MEAN CORPUSCULAR VOLUME: 88.9 fL (ref 77.6–95.7)
MEAN PLATELET VOLUME: 7.6 fL (ref 6.8–10.7)
MONOCYTES ABSOLUTE COUNT: 0.8 10*9/L (ref 0.3–0.8)
MONOCYTES RELATIVE PERCENT: 6.5 %
NEUTROPHILS ABSOLUTE COUNT: 7.6 10*9/L (ref 1.8–7.8)
NEUTROPHILS RELATIVE PERCENT: 60.3 %
NUCLEATED RED BLOOD CELLS: 0 /100{WBCs} (ref <=4)
PLATELET COUNT: 240 10*9/L (ref 150–450)
RED BLOOD CELL COUNT: 3.9 10*12/L — ABNORMAL LOW (ref 4.26–5.60)
RED CELL DISTRIBUTION WIDTH: 14.2 % (ref 12.2–15.2)
WBC ADJUSTED: 12.6 10*9/L — ABNORMAL HIGH (ref 3.6–11.2)

## 2022-04-12 LAB — LIPID PANEL
CHOLESTEROL/HDL RATIO SCREEN: 2.8 (ref 1.0–4.5)
CHOLESTEROL: 132 mg/dL (ref <=200)
HDL CHOLESTEROL: 47 mg/dL (ref 40–60)
LDL CHOLESTEROL CALCULATED: 51 mg/dL (ref 40–99)
NON-HDL CHOLESTEROL: 85 mg/dL (ref 70–130)
TRIGLYCERIDES: 171 mg/dL — ABNORMAL HIGH (ref 0–150)
VLDL CHOLESTEROL CAL: 34.2 mg/dL (ref 11–50)

## 2022-04-12 LAB — MAGNESIUM: MAGNESIUM: 1.5 mg/dL — ABNORMAL LOW (ref 1.6–2.6)

## 2022-04-12 LAB — GAMMA GT: GAMMA GLUTAMYL TRANSFERASE: 76 U/L — ABNORMAL HIGH

## 2022-04-12 LAB — PROTEIN / CREATININE RATIO, URINE
CREATININE, URINE: 151.5 mg/dL
PROTEIN URINE: 73.4 mg/dL
PROTEIN/CREAT RATIO, URINE: 0.484

## 2022-04-12 LAB — SLIDE REVIEW

## 2022-04-12 LAB — PHOSPHORUS: PHOSPHORUS: 3.2 mg/dL (ref 2.4–5.1)

## 2022-04-12 LAB — URIC ACID: URIC ACID: 10.7 mg/dL — ABNORMAL HIGH

## 2022-04-12 LAB — LACTATE DEHYDROGENASE: LACTATE DEHYDROGENASE: 157 U/L (ref 120–246)

## 2022-04-12 LAB — PARATHYROID HORMONE (PTH): PARATHYROID HORMONE INTACT: 141.1 pg/mL — ABNORMAL HIGH (ref 18.4–80.1)

## 2022-04-12 NOTE — Unmapped (Signed)
I spent 15 minutes with the patient at his clinic visit with him and his mom. I reviewed and updated his medications. He took his tacrolimus at 11pm last night. He has not been taking his imipramine as his pharmacy had a new manufacturer so he has not been taking it. He has had a gout flare a few times this year and his last one was 2-3 weeks ago. We discussed treating gout and he is in agreement. He will need to see CT surgery in follow up. I will message them to schedule an appt.He agrees to get a flu and COVID vaccine today. He will get his CXR/RUS on 12/131 at [facility]

## 2022-04-13 LAB — TACROLIMUS LEVEL, TROUGH: TACROLIMUS, TROUGH: 6.1 ng/mL (ref 5.0–15.0)

## 2022-04-14 DIAGNOSIS — N2889 Other specified disorders of kidney and ureter: Principal | ICD-10-CM

## 2022-04-14 DIAGNOSIS — I7121 Ascending aortic aneurysm, unspecified whether ruptured (CMS-HCC): Principal | ICD-10-CM

## 2022-04-14 LAB — CMV DNA, QUANTITATIVE, PCR: CMV VIRAL LD: NOT DETECTED

## 2022-04-14 LAB — EBV QUANTITATIVE PCR, BLOOD
EBV QUANT LOG(10): 1.83 {Log_IU}/mL — ABNORMAL HIGH (ref <0.00)
EBV QUANT: 68 [IU]/mL — ABNORMAL HIGH (ref <0)
EBV VIRAL LOAD RESULT: DETECTED — AB

## 2022-04-15 NOTE — Unmapped (Signed)
[employer] transplant nephrology clinic visit    assessment and plan  1. s/p kidney transplant 02/22/2000. baseline creatinine 2 mg/dl. +albuminuria. stage iiib chronic kidney disease. no donor specific hla ab detected '22.  2. immunosuppression. prednisone 5mg  daily. tacrolimus 12hr lvl 5-7 ng/ml.  3. hypertension. incr losartan to 25mg  bid d/t proteinuria and hold amlodipine recommended; Oscar [name] not interested in making a change. blood pressure goal < 130/80 mmhg.  4. albuminuria. angiotensin receptor blockade. sglt2 inh initiation for kidney protective effect reviewed.  5. ascending aortic aneurysm 3.7cm. stable appearance on chest ct '22. [place] [facility] follow up appt to be scheduled.  6. gout. allopurinol 50mg  daily, colchicine 0.6mg  daily prophylaxis recommended starting 4 weeks post acute flare; dose titration every 4 weeks prn to goal uric acid < 6 mg/dl. diet purine moderation reviewed.  7. preventive medicine. influenza '23. pcv13 pneumococcal '17. ppsv23 pneumococcal '22. covid-19 vaccine '23. dermatology skin cancer screening appt strongly encouraged. colonoscopy recommended. kidney ultrasound '22.    history of present illness    Oscar. Oscar [name] is a 46 year old gentleman seen in follow up post kidney transplant 02/22/2000. he stopped imipramine d/t new generic manufacturer and difference in tablet appearance. +gout flare in bilateral feet x2 weeks ago/now resolved. no headache. no lightheaded. no chest pain palpitations or shortness of breath. no lower extremity edema. appetite nl. no abdominal pain no n/v/d. no dysuria hematuria or difficulty voiding. all other systems reviewed and negative x10 systems.    past medical history:  1. living donor kidney transplant 02/22/2000. 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 baseline creatinine 2 mg/dl.  2. hypertension  3. echocardiogram '20: +moderate-severe lvh. lvef > 70%. +diastolic lv dysfunction. +ascending aorta dilatation.  4. seizure disorder hx  5. migraine  6. gout  7. 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  ??  past surgical history: bilateral native nephrectomy d/t renal cell carcinoma '99. left arm av fistula '99. kidney transplant '01. 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 left neck cyst excision '18. left neck and bilateral facial cyst excision '19.  ??  allergies: sulfa. erythromycin  ??  medications: tacrolimus 2mg  bid, prednisone 5mg  daily, losartan 25mg  daily, amlodipine 5mg  daily, divalproex 500mg  daily, gabapentin 300mg  q.pm.    physical exam: t97 p95 bp100/72 wt64kg bmi 19. wd/wn gentleman appropriate affect and mood. sclera anicteric. nmm no thrush. neck supple. heart rrr nl s1s2 +iii/vi systolic murmur. lungs clear bilateral. abd soft nt/nd. no lower ext edema. msk no synovitis or tophi. skin no rash. neuro alert oriented non focal exam.

## 2022-04-17 LAB — VITAMIN D 25 HYDROXY: VITAMIN D, TOTAL (25OH): 26.9 ng/mL (ref 20.0–80.0)

## 2022-04-19 ENCOUNTER — Telehealth: Payer: Self-pay

## 2022-04-19 LAB — VITAMIN D 1,25 DIHYDROXY: VITAMIN D 1,25-DIHYDROXY: 24 pg/mL

## 2022-04-19 NOTE — Patient Outreach (Signed)
  Care Coordination   04/19/2022 Name: Tyler Alvarez MRN: [id number] DOB: April 08, 1976   Care Coordination Outreach Attempts:  An unsuccessful telephone outreach was attempted today to offer the patient information about available care coordination services as a benefit of their health plan.   Follow Up Plan:  Additional outreach attempts will be made to offer the patient care coordination information and services.   Encounter Outcome:  No Answer   Care Coordination Interventions:  No, not indicated    [name], RN, BSN, CCM Care Management Coordinator [facility] Phone: [phone]: [phone]

## 2022-04-20 LAB — FSAB CLASS 1 ANTIBODY SPECIFICITY: HLA CLASS 1 ANTIBODY RESULT: POSITIVE

## 2022-04-20 LAB — FSAB CLASS 2 ANTIBODY SPECIFICITY: HLA CL2 AB RESULT: NEGATIVE

## 2022-04-20 MED ORDER — COLCHICINE 0.6 MG TABLET
ORAL_TABLET | Freq: Every day | ORAL | 11 refills | 30 days | Status: CP
Start: 2022-04-20 — End: 2023-04-20

## 2022-04-20 MED ORDER — ALLOPURINOL 100 MG TABLET
ORAL_TABLET | Freq: Every day | ORAL | 11 refills | 30 days | Status: CP
Start: 2022-04-20 — End: 2023-04-20

## 2022-04-20 NOTE — Unmapped (Signed)
Per Dr. Toni Arthurs pt will start allopurinol 50mg  daily and colchicine 0.6mg  daily in a few weeks. 4 weeks after he starts the medicine he will get labs drawn. New order sent to the patient to [facility]. If uric acid level is >6, then will increase allopurinol to 100mg . His mom is in agreement

## 2022-04-21 LAB — HLA DS POST TRANSPLANT
ANTI-DONOR DRW #1 MFI: 87 MFI
ANTI-DONOR HLA-A #1 MFI: 217 MFI
ANTI-DONOR HLA-A #2 MFI: 225 MFI
ANTI-DONOR HLA-C #1 MFI: 423 MFI
ANTI-DONOR HLA-DQB #1 MFI: 57 MFI
ANTI-DONOR HLA-DR #1 MFI: 79 MFI

## 2022-04-25 ENCOUNTER — Other Ambulatory Visit: Payer: Self-pay | Admitting: Family Medicine

## 2022-04-25 DIAGNOSIS — Z87898 Personal history of other specified conditions: Secondary | ICD-10-CM

## 2022-04-26 ENCOUNTER — Ambulatory Visit: Admit: 2022-04-26 | Discharge: 2022-04-27 | Payer: MEDICARE

## 2022-04-26 DIAGNOSIS — N281 Cyst of kidney, acquired: Secondary | ICD-10-CM | POA: Diagnosis not present

## 2022-04-26 DIAGNOSIS — Z940 Kidney transplant status: Secondary | ICD-10-CM | POA: Diagnosis not present

## 2022-04-26 DIAGNOSIS — J984 Other disorders of lung: Secondary | ICD-10-CM | POA: Diagnosis not present

## 2022-05-11 NOTE — Unmapped (Signed)
I called mom to see if Oscar [name] started his allopurinol but they have not started it. She asked if she can wait to start on 06/18/22 when they see Dr. Marisue Humble locally.She said she was unsure about taking more medications and I explained that as he gets older and more time after transplant that things will come up that need to be treated. She understands and will still wait and I will call her in Feb to see what she decided.

## 2022-05-16 NOTE — Unmapped (Signed)
[facility] Clinical Assessment & Refill Coordination Note    Oscar [name], DOB: Dec 01, 1975  Phone: [phone] (home)     All above HIPAA information was verified with patient's family member, Mr Hiser mom.     Was a Nurse, learning disability used for this call? No    Specialty Medication(s):   Transplant: Prograf 1mg  and Prednisone 5mg      Current Outpatient Medications   Medication Sig Dispense Refill    allopurinoL (ZYLOPRIM) 100 MG tablet Take 0.5 tablets (50 mg total) by mouth daily. 15 tablet 11    amlodipine (NORVASC) 5 MG tablet TAKE 1 TABLET BY MOUTH EVERY DAY IN THE EVENING 90 tablet 3    brompheniramine-phenylephrine (DIMETAPP COLD-ALLERGY, PE,) 1-2.5 mg/5 mL syrup Take 5 mL by mouth every six (6) hours as needed for cough. 118 mL 0    colchicine (COLCRYS) 0.6 mg tablet Take 1 tablet (0.6 mg total) by mouth daily. 30 tablet 11    divalproex (DEPAKOTE) 250 MG DR tablet TAKE 1 TABLET BY MOUTH 2 TIMES DAILY. ONLY TAKES 1 PER DAY (Patient taking differently: Take 2 tablets (500 mg total) by mouth daily.) 180 tablet 3    gabapentin (NEURONTIN) 300 MG capsule Take 1 capsule (300 mg total) by mouth nightly. 90 capsule 3    imipramine (TOFRANIL) 25 MG tablet Take 2 tablets (50 mg total) by mouth nightly. 180 tablet 3    losartan (COZAAR) 25 MG tablet Take 1 tablet (25 mg total) by mouth daily. 180 tablet 3    predniSONE (DELTASONE) 5 MG tablet Take 1 tablet (5 mg total) by mouth daily. 90 tablet 3    PROGRAF 1 mg capsule Take 2 capsules (2 mg total) by mouth two (2) times a day. 360 capsule 3     No current facility-administered medications for this visit.        Changes to medications: Jiancarlo reports no changes at this time.    Allergies   Allergen Reactions    Erythromycin     Erythromycin Base      Other reaction(s): NAUSEA    Sulfa (Sulfonamide Antibiotics)      Other reaction(s): ITCHING    Sulfacetamide Sodium        Changes to allergies: No    SPECIALTY MEDICATION ADHERENCE     Prednisone 5 mg: 30 days of medicine on hand   Prograf 1 mg: 30 days of medicine on hand     Medication Adherence    Patient reported X missed doses in the last month: 0  Specialty Medication: Prednisone 5mg   Patient is on additional specialty medications: Yes  Additional Specialty Medications: Prograf 1mg   Patient Reported Additional Medication X Missed Doses in the Last Month: 0  Patient is on more than two specialty medications: No      Adherence tools used: patient uses a pill box to manage medications      Support network for adherence: family member                Specialty medication(s) dose(s) confirmed: Regimen is correct and unchanged.     Are there any concerns with adherence? No    Adherence counseling provided? Not needed    CLINICAL MANAGEMENT AND INTERVENTION      Clinical Benefit Assessment:    Do you feel the medicine is effective or helping your condition? Yes    Clinical Benefit counseling provided? Not needed    Adverse Effects Assessment:  Are you experiencing any side effects? No    Are you experiencing difficulty administering your medicine? No    Quality of Life Assessment:    Quality of Life    Rheumatology  Oncology  Dermatology  Cystic Fibrosis          How many days over the past month did your kidney transplant  keep you from your normal activities? For example, brushing your teeth or getting up in the morning. 0    Have you discussed this with your provider? Not needed    Acute Infection Status:    Acute infections noted within Epic:  No active infections  Patient reported infection: None    Therapy Appropriateness:    Is therapy appropriate and patient progressing towards therapeutic goals? Yes, therapy is appropriate and should be continued    DISEASE/MEDICATION-SPECIFIC INFORMATION      N/A    Solid Organ Transplant: Not Applicable    PATIENT SPECIFIC NEEDS     Does the patient have any physical, cognitive, or cultural barriers? No    Is the patient high risk? No    Did the patient require a clinical intervention? No    Does the patient require physician intervention or other additional services (i.e., nutrition, smoking cessation, social work)? No    SOCIAL DETERMINANTS OF HEALTH     At the [facility], we have learned that life circumstances - like trouble affording food, housing, utilities, or transportation can affect the health of many of our patients.   That is why we wanted to ask: are you currently experiencing any life circumstances that are negatively impacting your health and/or quality of life? Patient declined to answer    Social Determinants of Health     Financial Resource Strain: Not on file   Internet Connectivity: Not on file   Food Insecurity: Not on file   Tobacco Use: Low Risk  (04/12/2022)    Patient History     Smoking Tobacco Use: Never     Smokeless Tobacco Use: Never     Passive Exposure: Not on file   Housing/Utilities: Not on file   Alcohol Use: Not on file   Transportation Needs: Not on file   Substance Use: Not on file   Health Literacy: Not on file   Physical Activity: Not on file   Interpersonal Safety: Not on file   Stress: Not on file   Intimate Partner Violence: Not on file   Depression: Not on file   Social Connections: Not on file       Would you be willing to receive help with any of the needs that you have identified today? Not applicable       SHIPPING     Specialty Medication(s) to be Shipped:   Transplant: Patient declined prednisone and prograf today.    Other medication(s) to be shipped: No additional medications requested for fill at this time     Changes to insurance: No    Delivery Scheduled: Patient declined refill at this time due to has a month on hand.     Medication will be delivered via UPS to the confirmed prescription address in [facility].    The patient will receive a drug information handout for each medication shipped and additional FDA Medication Guides as required.  Verified that patient has previously received a Conservation officer, historic buildings and a Surveyor, mining.    The patient or caregiver noted above participated in the development of this care  plan and knows that they can request review of or adjustments to the care plan at any time.      All of the patient's questions and concerns have been addressed.    Tera Helper, [facility]   [facility] Specialty Pharmacist

## 2022-05-17 ENCOUNTER — Ambulatory Visit
Admit: 2022-05-17 | Discharge: 2022-05-18 | Payer: MEDICARE | Attending: Thoracic Surgery (Cardiothoracic Vascular Surgery) | Primary: Thoracic Surgery (Cardiothoracic Vascular Surgery)

## 2022-05-17 ENCOUNTER — Ambulatory Visit: Admit: 2022-05-17 | Discharge: 2022-05-18 | Payer: MEDICARE

## 2022-05-17 DIAGNOSIS — I729 Aneurysm of unspecified site: Principal | ICD-10-CM

## 2022-05-17 DIAGNOSIS — I7121 Ascending aortic aneurysm, unspecified whether ruptured (CMS-HCC): Principal | ICD-10-CM

## 2022-05-17 DIAGNOSIS — I2510 Atherosclerotic heart disease of native coronary artery without angina pectoris: Secondary | ICD-10-CM | POA: Diagnosis not present

## 2022-05-17 DIAGNOSIS — R918 Other nonspecific abnormal finding of lung field: Secondary | ICD-10-CM | POA: Diagnosis not present

## 2022-05-17 DIAGNOSIS — Z79899 Other long term (current) drug therapy: Secondary | ICD-10-CM | POA: Diagnosis not present

## 2022-05-17 DIAGNOSIS — N2889 Other specified disorders of kidney and ureter: Secondary | ICD-10-CM | POA: Diagnosis not present

## 2022-05-17 DIAGNOSIS — I10 Essential (primary) hypertension: Secondary | ICD-10-CM | POA: Diagnosis not present

## 2022-05-17 NOTE — Unmapped (Signed)
Pt needs an ECHO soon. I called [facility] central scheduling to see if they could do the ECHO at their facility. I was told they could. They provided the Tax ID [id number] and NPI [id number] numbers. I added the order and directed to [facility]. I sent a message to pre arrival to obtain auth for the ECHO.     Regina Eck RN  Nurse Coordinator-Cardiothoracic Surgery  Phone: [phone]  Pager: [phone]

## 2022-05-17 NOTE — Unmapped (Cosign Needed)
Cardiac Surgery Consult Note  Consulting Physician: Dr. Cain Sieve  Reason for Consult: ascending aortic aneurysm     History of Present Illness:   Oscar [name] is a 47 y.o. who presents to clinic for his 1 year follow-up for ascending aortic aneurysm.  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 He was last seen two years ago and imaging was stable, he presents today for two year CT scan.     He continues to feel well overall without concern, denies any cardiac symptoms. No changes in medical status since last visit.     Allergies:  Erythromycin, Erythromycin base, Sulfa (sulfonamide antibiotics), and Sulfacetamide sodium    Scheduled Meds:  Current Outpatient Medications   Medication Sig Dispense Refill    allopurinoL (ZYLOPRIM) 100 MG tablet Take 0.5 tablets (50 mg total) by mouth daily. 15 tablet 11    amlodipine (NORVASC) 5 MG tablet TAKE 1 TABLET BY MOUTH EVERY DAY IN THE EVENING 90 tablet 3    brompheniramine-phenylephrine (DIMETAPP COLD-ALLERGY, PE,) 1-2.5 mg/5 mL syrup Take 5 mL by mouth every six (6) hours as needed for cough. 118 mL 0    colchicine (COLCRYS) 0.6 mg tablet Take 1 tablet (0.6 mg total) by mouth daily. 30 tablet 11    divalproex (DEPAKOTE) 250 MG DR tablet TAKE 1 TABLET BY MOUTH 2 TIMES DAILY. ONLY TAKES 1 PER DAY (Patient taking differently: Take 2 tablets (500 mg total) by mouth daily.) 180 tablet 3    gabapentin (NEURONTIN) 300 MG capsule Take 1 capsule (300 mg total) by mouth nightly. 90 capsule 3    imipramine (TOFRANIL) 25 MG tablet Take 2 tablets (50 mg total) by mouth nightly. 180 tablet 3    losartan (COZAAR) 25 MG tablet Take 1 tablet (25 mg total) by mouth daily. 180 tablet 3    predniSONE (DELTASONE) 5 MG tablet Take 1 tablet (5 mg total) by mouth daily. 90 tablet 3    PROGRAF 1 mg capsule Take 2 capsules (2 mg total) by mouth two (2) times a day. 360 capsule 3     No current facility-administered medications for this visit.       Medical History:  Past Medical History:   Diagnosis Date    Epilepsy (CMS-HCC)     Esophageal reflux     Kidney transplant 02/22/2000 12/10/2012    Due to RCC in context of tuberous sclerosis      RLS (restless legs syndrome)     Seizure (CMS-HCC)     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CMS-HCC)        Surgical History:  Past Surgical History:   Procedure Laterality Date    COMBINED KIDNEY-PANCREAS TRANSPLANT         Social History:  Social History     Tobacco Use   Smoking Status Never   Smokeless Tobacco Never     Social History     Substance and Sexual Activity   Alcohol Use No    Alcohol/week: 0.0 standard drinks of alcohol     Social History     Substance and Sexual Activity   Drug Use No     Living situation: the patient lives with their family.    Family History:  The patient's family history includes COPD in his father; Cancer in his mother; Migraines in his mother; Thyroid disease  in his mother.    Review of Systems: A 12 system review of systems was negative except as noted in HPI.    Physical Exam:  Vitals:    05/17/22 1259   BP: 120/72   Pulse: 98   Temp: 37.6 ??C (99.6 ??F)   SpO2: 99%     General: alert and oriented, resting comfortably in NAD  HEENT: normocephalic, atraumatic. sclera anicteric, MMM  Neck: Supple.  Pulmonary: non-labored breathing, lungs CTAB, No wheezes, rales or rhonchi.   CV: normal rate and regular rhythm. Normal S1, S2, III/VI systolic murmur. 2+ DP and radial pulses bilaterally.   Abdomen/GI: soft, non-tender, non-distended. positive bowel sounds throughout abdomen. no rebound or guarding present.  Neurologic: A&O x 3, answering questions appropriately. strength equal in upper & lower extremities bilaterally. sensation intact throughout. no facial droop noted.  Musculoskeletal: extremities warm and well perfused.   Skin: warm and dry. No rashes.    Diagnostic Studies:  CT Endo C/A/P 05/17/2022:  Ascending aorta measures at 3.5cm     Assessment/Plan:   Oscar [name] is a very pleasant 47 y.o. male with past medical history per above that is being seen in evaluation for routine monitoring of ascending aortic aneurysm.      Ascending aortic aneurysm is stable from prior CT. No current indication for surgical intervention of the aorta. Continue good impulse control with BP and HR management. Patient does have a known murmur but appears louder on exam, patient endorsed that his nephrologist also felt like the murmur was louder than prior; he has not had an echocardiogram in many years. Given this finding will request a local echocardiogram to be completed as soon as possible, we will follow-up on these studies.     Pending completion of TTE will plan for re-eval in 2 years with CT and TTE.     Dr. Sindy Guadeloupe was present for the clinic visit and endorses the plan of care.

## 2022-05-22 DIAGNOSIS — R911 Solitary pulmonary nodule: Principal | ICD-10-CM

## 2022-05-22 NOTE — Unmapped (Signed)
I received a message from Bluford Kaufmann, [place] with cardiac surgery to send a referral to thoracic surgery for appt within 6-8 wks with follow up CT for New mixed solid and groundglass nodules with interlobular septal thickening greatest in the left lower lobe and lingula seen on 05/18/22 CT endo c/a/p. The referral was sent. I made Keturah Shavers thoracic new pt scheduler aware.     Regina Eck RN  Nurse Coordinator-Cardiothoracic Surgery  Phone: [phone]  Pager: [phone]

## 2022-05-23 NOTE — Unmapped (Signed)
Telephone call made in attempt to schedule CT and clinic appts and was advised that the patient was not feeling well and asleep; advised I will try again at a later time. SH

## 2022-05-26 NOTE — Unmapped (Signed)
2nd attempt made to schedule CT and clinic visit and no answer; left a detailed message with my direct number for patient to return call to setup appointments. SH

## 2022-05-30 NOTE — Unmapped (Signed)
I spoke to pt's mom to explain that the radiologist wanted pt to follow up with thoracic surgery to discuss the lung nodule changes seen on CT. She verbalized understanding and is willing to schedule the appt. I made Sharita with thoracic surgery aware.     Regina Eck RN  Nurse Coordinator-Cardiothoracic Surgery  Phone: [phone]  Pager: [phone]

## 2022-05-30 NOTE — Unmapped (Signed)
3rd attempt made to schedule pt for CT and clinic visit and he gave permission to add his mother Oscar [name] to the chart to handle his appointments. Oscar [name] was not aware why he had been referred to CT surgery and would like to have some clarification before scheduling. Advised that I would reach out to Albin Fischer, RN and have give her a call. SH

## 2022-06-08 DIAGNOSIS — Z940 Kidney transplant status: Principal | ICD-10-CM

## 2022-06-08 MED ORDER — PROGRAF 1 MG CAPSULE
ORAL_CAPSULE | Freq: Two times a day (BID) | ORAL | 3 refills | 90 days | Status: CP
Start: 2022-06-08 — End: 2023-06-08
  Filled 2022-06-13: qty 360, 90d supply, fill #0

## 2022-06-08 MED ORDER — PREDNISONE 5 MG TABLET
ORAL_TABLET | Freq: Every day | ORAL | 3 refills | 90 days | Status: CP
Start: 2022-06-08 — End: 2023-06-08
  Filled 2022-06-13: qty 90, 90d supply, fill #0

## 2022-06-08 NOTE — Unmapped (Signed)
Pt request for RX Refill

## 2022-06-08 NOTE — Unmapped (Signed)
[facility] Refill Coordination Note    Specialty Medication(s) to be Shipped:   Transplant: Prograf 1mg  and Prednisone 5mg     Other medication(s) to be shipped: No additional medications requested for fill at this time     Oscar [name], DOB: Apr 13, 1976  Phone: [phone] (home)       All above HIPAA information was verified with patient.     Was a Nurse, learning disability used for this call? No    Completed refill call assessment today to schedule patient's medication shipment from the [facility] [phone]).  All relevant notes have been reviewed.     Specialty medication(s) and dose(s) confirmed: Regimen is correct and unchanged.   Changes to medications: Hasheem reports no changes at this time.  Changes to insurance: No  New side effects reported not previously addressed with a pharmacist or physician: None reported  Questions for the pharmacist: No    Confirmed patient received a Conservation officer, historic buildings and a Surveyor, mining with first shipment. The patient will receive a drug information handout for each medication shipped and additional FDA Medication Guides as required.       DISEASE/MEDICATION-SPECIFIC INFORMATION        N/A    SPECIALTY MEDICATION ADHERENCE     Medication Adherence    Patient reported X missed doses in the last month: 0  Specialty Medication: PROGRAF 1 MG capsule (tacrolimus)  Patient is on additional specialty medications: Yes  Additional Specialty Medications: predniSONE 5 MG tablet (DELTASONE)  Patient Reported Additional Medication X Missed Doses in the Last Month: 0  Patient is on more than two specialty medications: No      Adherence tools used: patient uses a pill box to manage medications      Support network for adherence: family member                    Were doses missed due to medication being on hold? No    prednisone 5 mg: 10 days of medicine on hand   Prograf 1 mg: 10 days of medicine on hand       REFERRAL TO PHARMACIST     Referral to the pharmacist: Not needed      [facility]     Shipping address confirmed in Epic.     Delivery Scheduled: Yes, Expected medication delivery date: 06/14/22.     Medication will be delivered via UPS to the prescription address in Epic WAM.    Quintella Reichert   [facility] Specialty Technician

## 2022-06-08 NOTE — Unmapped (Signed)
The patient is requesting a medication refill

## 2022-06-12 ENCOUNTER — Ambulatory Visit: Admit: 2022-06-12 | Discharge: 2022-06-13 | Payer: MEDICARE

## 2022-06-12 DIAGNOSIS — I371 Nonrheumatic pulmonary valve insufficiency: Secondary | ICD-10-CM | POA: Diagnosis not present

## 2022-06-12 DIAGNOSIS — I7121 Aneurysm of the ascending aorta, without rupture: Secondary | ICD-10-CM | POA: Diagnosis not present

## 2022-06-14 DIAGNOSIS — Z940 Kidney transplant status: Secondary | ICD-10-CM | POA: Diagnosis not present

## 2022-06-19 DIAGNOSIS — Z940 Kidney transplant status: Secondary | ICD-10-CM | POA: Diagnosis not present

## 2022-06-19 DIAGNOSIS — N2581 Secondary hyperparathyroidism of renal origin: Secondary | ICD-10-CM | POA: Diagnosis not present

## 2022-06-19 DIAGNOSIS — I7121 Aneurysm of the ascending aorta, without rupture: Secondary | ICD-10-CM | POA: Diagnosis not present

## 2022-06-19 DIAGNOSIS — I10 Essential (primary) hypertension: Secondary | ICD-10-CM | POA: Diagnosis not present

## 2022-06-19 DIAGNOSIS — D849 Immunodeficiency, unspecified: Secondary | ICD-10-CM | POA: Diagnosis not present

## 2022-06-19 DIAGNOSIS — I517 Cardiomegaly: Secondary | ICD-10-CM | POA: Diagnosis not present

## 2022-06-19 DIAGNOSIS — E559 Vitamin D deficiency, unspecified: Secondary | ICD-10-CM | POA: Diagnosis not present

## 2022-06-19 DIAGNOSIS — I5189 Other ill-defined heart diseases: Secondary | ICD-10-CM | POA: Diagnosis not present

## 2022-06-23 ENCOUNTER — Other Ambulatory Visit: Payer: Self-pay | Admitting: Family Medicine

## 2022-06-23 MED ORDER — LOSARTAN 25 MG TABLET
ORAL_TABLET | Freq: Two times a day (BID) | ORAL | 3 refills | 0 days
Start: 2022-06-23 — End: ?

## 2022-06-23 NOTE — Unmapped (Signed)
I called pt's mom to check in and see how [name] was doing. He is doing well. He has not had any gout flares. He had his appt with Dr. Marisue Humble and they have decided to not take any treatment for gout. I told her that is fine. They will follow up with him as scheduled.

## 2022-06-26 MED ORDER — LOSARTAN 25 MG TABLET
ORAL_TABLET | Freq: Two times a day (BID) | ORAL | 3 refills | 90 days | Status: CP
Start: 2022-06-26 — End: ?

## 2022-06-30 NOTE — Unmapped (Signed)
06/19/2022 [facility] progress note.

## 2022-07-06 ENCOUNTER — Ambulatory Visit: Admit: 2022-07-06 | Discharge: 2022-07-06 | Payer: MEDICARE | Attending: Surgery | Primary: Surgery

## 2022-07-06 ENCOUNTER — Ambulatory Visit: Admit: 2022-07-06 | Discharge: 2022-07-06 | Payer: MEDICARE

## 2022-07-06 DIAGNOSIS — R911 Solitary pulmonary nodule: Principal | ICD-10-CM

## 2022-07-06 DIAGNOSIS — J984 Other disorders of lung: Secondary | ICD-10-CM | POA: Diagnosis not present

## 2022-07-06 DIAGNOSIS — R918 Other nonspecific abnormal finding of lung field: Secondary | ICD-10-CM | POA: Diagnosis not present

## 2022-07-06 DIAGNOSIS — Q851 Tuberous sclerosis: Secondary | ICD-10-CM | POA: Diagnosis not present

## 2022-07-06 DIAGNOSIS — Z940 Kidney transplant status: Secondary | ICD-10-CM | POA: Diagnosis not present

## 2022-07-06 DIAGNOSIS — D174 Benign lipomatous neoplasm of intrathoracic organs: Secondary | ICD-10-CM | POA: Diagnosis not present

## 2022-07-06 DIAGNOSIS — I2510 Atherosclerotic heart disease of native coronary artery without angina pectoris: Secondary | ICD-10-CM | POA: Diagnosis not present

## 2022-07-06 DIAGNOSIS — J9811 Atelectasis: Secondary | ICD-10-CM | POA: Diagnosis not present

## 2022-07-06 NOTE — Unmapped (Signed)
3 month Chest CT on 10/05/2022 at 1:20 pm at [facility] with office visit with Dr. Jacqulyn Bath immediately after his appointment at [facility] 2nd floor pulmonary clinic.

## 2022-07-07 NOTE — Unmapped (Signed)
Thoracic Surgery New Patient Clinic Note    ASSESSMENT /PLAN  In summary, Oscar [name] is a 47 y.o. male with a history of tuberous sclerosis and living related kidney transplant on immunosuppression with a recent CT showing chronic findings of tuberous sclerosis but with new lower lobe ground glass nodules and septal thickening suspicious for an infectious/inflammatory process. Repeat CT performed today appears improved and he is asymptomatic. I do not currently feel a bronchoscopy or other procedure is necessary and will plan to repeat a CT in 3 months to ensure complete resolution. He was instructed to call my office should he develop any infectious/respiratory symptoms.      I personally spent 50 minutes face-to-face and non-face-to-face in the care of this patient, which includes all pre, intra, and post visit time on the date of service.  All documented time was specific to the E/M visit and does not include any procedures that may have been performed.    Merrie Roof MD      HISTORY OF PRESENT ILLNESS    Today I saw Oscar [name] in the Multidisciplinary Thoracic Oncology Clinic at the [facility] in consultation for new bilateral lower lobe lung nodules sent for consultation at the request of Dr. Cain Sieve .  His history was obtained from the patient.  I have also reviewed his medical records and imaging prior to today's visit.  As you know, Oscar [name] is a 47 y.o. Caucasian male never smoker with and a history of tuberous sclerosis status post living donor kidney transplant in October of 2001, chronic immunosuppression, hypertension, gout and is being followed for an ascending aortic aneurysm. He takes daily tacrolimus and prednisone. A recent CTA of the chest performed on May 17, 2022 demonstrated a 3.6 cm ascending aorta, unchanged, and new mixed solid and ground glass nodules with interlobular septal thickening in the bilateral lower lobes and lingula in addition to increased number and prominence of multiple subcutaneous and pulmonary nodules - consistent with tuberous sclerosis. He denies any respiratory symptoms currently but states he may have had a cold during that time. He states he has not had previous infectious complications from his immunosuppression. He denies fevers, chills, sweats, cough, and hemoptysis. A repeat CT chest performed today demonstrates unchanged tuberosclerosis associated lung disease with multifocal micronodular pneumocyte hyperplasia and new subsegmental and dependent atelectasis in the left lower lobe and lingula. My interpretation is that the lower lobe nodules and septal thickening appear improved compared to January's CT.    Recent pulmonary function tests have not been performed.  He denies fevers, night sweats, chest pain on exertion, or shortness of breath.  He has lost 0 pounds over the past 3 months.  He is able to walk 1 mile per day and 1 flight of stairs.  The ECOG performance status is 0 - Asymptomatic    REVIEW OF SYSTEMS  A comprehensive review of 10 systems was negative except for pertinent positives noted in HPI.    PAST MEDICAL HISTORY  Past Medical History:   Diagnosis Date    Epilepsy (CMS-HCC)     Esophageal reflux     Kidney transplant 02/22/2000 12/10/2012    Due to RCC in context of tuberous sclerosis      RLS (restless legs syndrome)     Seizure (CMS-HCC)     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CMS-HCC)      No past medical history pertinent negatives.    PAST SURGICAL HISTORY  Prior cardiothoracic surgery:  no  Prior living related kidney transplant      FAMILY MEDICAL HISTORY  Family history of heart disease:  no  Family history of lung cancer:  no    family history includes COPD in his father; Cancer in his mother; Migraines in his mother; Thyroid disease in his mother. There is no history of Kidney disease.     SOCIAL HISTORY  Social History     Socioeconomic History    Marital status: Single   Tobacco Use    Smoking status: Never Smokeless tobacco: Never   Substance and Sexual Activity    Alcohol use: No     Alcohol/week: 0.0 standard drinks of alcohol    Drug use: No        MEDICATIONS  Current Outpatient Medications   Medication Sig Dispense Refill    allopurinoL (ZYLOPRIM) 100 MG tablet Take 0.5 tablets (50 mg total) by mouth daily. 15 tablet 11    amlodipine (NORVASC) 5 MG tablet TAKE 1 TABLET BY MOUTH EVERY DAY IN THE EVENING 90 tablet 3    brompheniramine-phenylephrine (DIMETAPP COLD-ALLERGY, PE,) 1-2.5 mg/5 mL syrup Take 5 mL by mouth every six (6) hours as needed for cough. 118 mL 0    colchicine (COLCRYS) 0.6 mg tablet Take 1 tablet (0.6 mg total) by mouth daily. 30 tablet 11    divalproex (DEPAKOTE) 250 MG DR tablet TAKE 1 TABLET BY MOUTH 2 TIMES DAILY. ONLY TAKES 1 PER DAY (Patient taking differently: Take 2 tablets (500 mg total) by mouth daily.) 180 tablet 3    gabapentin (NEURONTIN) 300 MG capsule Take 1 capsule (300 mg total) by mouth nightly. 90 capsule 3    imipramine (TOFRANIL) 25 MG tablet Take 2 tablets (50 mg total) by mouth nightly. 180 tablet 3    losartan (COZAAR) 25 MG tablet TAKE 1 TABLET BY MOUTH TWO TIMES A DAY. 180 tablet 3    predniSONE (DELTASONE) 5 MG tablet Take 1 tablet (5 mg total) by mouth daily. 90 tablet 3    PROGRAF 1 mg capsule Take 2 capsules (2 mg total) by mouth two (2) times a day. 360 capsule 3     No current facility-administered medications for this visit.       ALLERGIES  is allergic to erythromycin, erythromycin base, sulfa (sulfonamide antibiotics), and sulfacetamide sodium.    PHYSICAL EXAM  Vital Signs: BP 124/67 (BP Site: R Arm, BP Position: Sitting, BP Cuff Size: Small)  - Pulse 87  - Temp 36.2 ??C (97.1 ??F)  - Wt 59.9 kg (132 lb)  - SpO2 98%  - BMI 18.41 kg/m??  Body mass index is 18.41 kg/m??..  Ht Readings from Last 1 Encounters:   05/17/22 180.3 cm (5' 11)          Constitutional:   On examination, Oscar [name] is a well-nourished, well-developed man who is no acute distress and answers questions appropriately.   Skin:  Warm and dry without exanthema.   Ears, Nose, Mouth, Throat:  Normocephalic and atraumatic.  Oral mucosa is moist.  There is no obvious bleeding in the gum.  Oropharynx is without erythema or exudate.     Eyes:  Pupils are equal, round and reactive to light.  Extraocular movements are intact.     Respiratory:   The lung fields are clear to auscultation bilaterally without rales, rhonchi or wheezing.  Fingers without clubbing.   Cardiovascular:  No elevation of JVP.  Heart is regular with no appreciable gallops, rubs, murmurs  or extra heart sounds.  Radial and pedal pulses are 2/4 bilaterally.  There are no audible carotid bruits.  Warm extremities without edema or cyanosis.   Gastrointestinal:   Soft, nontender, nondistended in all quadrants.  Normoactive audible bowel sounds.  No palpable masses.    Musculoskeletal:   Stable Gait. Moves all extremities well.   Neurologic:  The patient is oriented to person, place and time.  Strength and sensation are grossly intact.  Face is symmetric.   Hematologic/Lymphatic Immunologic:   No palpable supraclavicular, cervical, or axillary lymphadenopathy.

## 2022-07-21 ENCOUNTER — Encounter (HOSPITAL_COMMUNITY): Payer: Self-pay | Admitting: *Deleted

## 2022-07-21 ENCOUNTER — Ambulatory Visit (HOSPITAL_COMMUNITY)
Admission: EM | Admit: 2022-07-21 | Discharge: 2022-07-21 | Disposition: A | Discharge status: Home / Self Care | Payer: 59 | Attending: Emergency Medicine | Admitting: Emergency Medicine

## 2022-07-21 DIAGNOSIS — L0291 Cutaneous abscess, unspecified: Secondary | ICD-10-CM

## 2022-07-21 DIAGNOSIS — L0231 Cutaneous abscess of buttock: Secondary | ICD-10-CM

## 2022-07-21 MED ORDER — ACETAMINOPHEN 325 MG PO TABS
650.0000 mg | ORAL_TABLET | Freq: Once | ORAL | Status: AC
  Administered 2022-07-21: 650 mg via ORAL

## 2022-07-21 MED ORDER — DOXYCYCLINE HYCLATE 100 MG PO CAPS: 100.0000 mg | ORAL_CAPSULE | Freq: Two times a day (BID) | ORAL | 0 refills | Status: DC

## 2022-07-21 MED ORDER — ACETAMINOPHEN 325 MG PO TABS
ORAL_TABLET | ORAL | Status: AC
  Filled 2022-07-21: qty 2

## 2022-07-21 MED ORDER — LIDOCAINE HCL (PF) 1 % IJ SOLN
INTRAMUSCULAR | Status: AC
  Filled 2022-07-21: qty 30

## 2022-07-21 NOTE — ED Provider Notes (Signed)
[facility]    CSN: [id number] Arrival date & time: 07/21/22  1811      History   Chief Complaint Chief Complaint  Patient presents with   Abscess    HPI Tyler Alvarez is a 47 y.o. male.    Reports abscess to his buttocks area, have been cleaning and bandaging area. Reports cystic area, different than regular boil. Drained for a while, now it is not draining and the area is hard.   Reports temp of 99.7 two days ago  The history is provided by the patient and a parent.  Abscess   Past Medical History:  Diagnosis Date   ADD (attention deficit disorder)    Anxiety    Ascending aortic aneurysm (HCC)    Benign skin lesion of neck    left neck cyst   Chronic kidney disease    s/p transplant 2001   Family history of adverse reaction to anesthesia    Pt mother stated Father-"I think that he stopped breathing, they called a code but he came through okay"   GERD (gastroesophageal reflux disease)    Headache    Migraines   Heart murmur    Hypertension    Immune deficiency disorder ([place])    Restless leg syndrome    Seizures ([place])    treated by Dr. 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[place])    Wears glasses     Patient Active Problem List   Diagnosis Date Noted   Abscess 03/22/2021   History of seizures 03/22/2021   Sepsis ([place]) 02/25/2021   Cellulitis and abscess of buttock    Cellulitis 02/24/2021   Right foot pain 06/16/2020   Need for immunization against influenza 01/20/2020   Anemia 01/20/2020   Electrolyte abnormality 01/20/2020   Sepsis without acute organ dysfunction ([place])    Gastroesophageal reflux disease    Community acquired pneumonia 01/03/2020   Neck pain 09/18/2019   Acute-on-chronic kidney injury ([place]) 09/06/2019   Gout 10/09/2017   CKD (chronic kidney disease), stage III ([place]) 10/09/2017   Normocytic anemia    Renal transplant, status post    Essential hypertension    Aneurysm ([place]) 07/16/2014    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[place]) 04/05/2012   HEARING LOSS, LEFT EAR 11/25/2008    Past Surgical History:  Procedure Laterality Date   AV FISTULA PLACEMENT     KIDNEY TRANSPLANT  2001   LESION EXCISION WITH COMPLEX REPAIR Left 05/24/2021   Procedure: EXCISION BENIGN LESION NECK 9CM, LAYERED CLOSURE NECK 10CM;  Surgeon: Irene Limbo, MD;  Location: [facility];  Service: Plastics;  Laterality: Left;   MASS EXCISION Left 12/11/2016   Procedure: EXCISION OF BENIGN LESION OF THE NECK WITH LAYERED CLOSURE;  Surgeon: Irene Limbo, MD;  Location: [facility];  Service: Plastics;  Laterality: Left;   MASS EXCISION     neck x2   MASS EXCISION Bilateral 04/01/2018   Procedure: excision benign lesions left neck and right and left cheek ( 4cm, 2cm, 1cm x3), layered closure neck <10cm, layered closure right cheek 1cm;  Surgeon: Irene Limbo, MD;  Location: [facility];  Service: Plastics;  Laterality: Bilateral;  left cheek lesion   MULTIPLE TOOTH EXTRACTIONS     NEPHRECTOMY     both removed in 1999, 3 months apart   REVISON OF ARTERIOVENOUS FISTULA Left 07/16/2014   Procedure: EXCISION ANEURYSMAL AREA OF LEFT ARTERIOVENOUS FISTULA;  Surgeon: Serafina Mitchell, MD;  Location: [facility];  Service: Vascular;  Laterality: Left;       Home Medications    Prior to Admission medications   Medication Sig Start Date End Date Taking? Authorizing Provider  acetaminophen (TYLENOL) 500 MG tablet Take 1,000 mg by mouth daily as needed for mild pain, headache or fever.   Yes [provider]  divalproex (DEPAKOTE) 250 MG DR tablet TAKE 1 TABLET BY MOUTH TWICE A DAY 04/26/22  Yes Ganta, Anupa, DO  doxycycline (VIBRAMYCIN) 100 MG capsule Take 1 capsule (100 mg total) by mouth 2 (two) times daily. 07/21/22  Yes Louretta Shorten, [place] N, FNP  gabapentin (NEURONTIN) 300 MG capsule TAKE 1 CAPSULE BY MOUTH EVERYDAY AT BEDTIME 06/26/22  Yes Ganta, Anupa, DO  imipramine (TOFRANIL) 25 MG tablet Take 50 mg by mouth at bedtime.    Yes  [provider]  losartan (COZAAR) 25 MG tablet Take 25 mg by mouth daily. 03/31/21  Yes [provider]  predniSONE (DELTASONE) 5 MG tablet Take 5 mg by mouth daily.    Yes [provider]  tacrolimus (PROGRAF) 1 MG capsule Take 2 mg by mouth 2 (two) times daily.    Yes [provider]  aspirin EC 81 MG tablet Take 81 mg by mouth daily. Swallow whole.    [provider]  brompheniramine-pseudoephedrine (DIMETAPP) 1-15 MG/5ML ELIX Take 20 mLs by mouth daily as needed for allergies (Cold).    [provider]  Cholecalciferol (VITAMIN D3 PO) Take 1 tablet by mouth daily.    [provider]    Family History Family History  Problem Relation Age of Onset   Hypothyroidism Mother    Cancer Mother 39       cervical   Hypertension Mother    COPD Mother    COPD Father 27       COPD   Ulcers Father    COPD Brother    Heart disease Brother    Cancer Other     Social History Social History   Tobacco Use   Smoking status: Never   Smokeless tobacco: Never  Vaping Use   Vaping Use: Never used  Substance Use Topics   Alcohol use: No   Drug use: No     Allergies   Erythromycin and Sulfa antibiotics   Review of Systems Review of Systems   Physical Exam Triage Vital Signs ED Triage Vitals  Enc Vitals Group     BP 07/21/22 1839 139/85     Pulse Rate 07/21/22 1839 96     Resp 07/21/22 1839 18     Temp 07/21/22 1839 98.3 F (36.8 C)     Temp Source 07/21/22 1839 Oral     SpO2 07/21/22 1839 98 %     Weight --      Height --      Head Circumference --      Peak Flow --      Pain Score 07/21/22 1837 6     Pain Loc --      Pain Edu? --      Excl. in [place]? --    No data found.  Updated Vital Signs BP 139/85 (BP Location: Left Arm)   Pulse 96   Temp 98.3 F (36.8 C) (Oral)   Resp 18   SpO2 98%   Visual Acuity Right Eye Distance:   Left Eye Distance:   Bilateral Distance:    Right Eye Near:   Left Eye  Near:    Bilateral Near:  Physical Exam   UC Treatments / Results  Labs (all labs ordered are listed, but only abnormal results are displayed) Labs Reviewed  AEROBIC/ANAEROBIC CULTURE W GRAM STAIN (SURGICAL/DEEP WOUND)    EKG   Radiology No results found.  Procedures Incision and Drainage  Date/Time: 07/21/2022 7:32 PM  Performed by: Mahari Strahm, [place] N, FNP Authorized by: Zulema Pulaski, [place] N, FNP   Consent:    Consent obtained:  Verbal   Consent given by:  Patient   Risks, benefits, and alternatives were discussed: yes     Risks discussed:  Bleeding, incomplete drainage and pain   Alternatives discussed:  No treatment and delayed treatment Universal protocol:    Procedure explained and questions answered to patient or proxy's satisfaction: yes     Patient identity confirmed:  Verbally with patient Location:    Type:  Abscess   Size:  6 cm x 6 cm   Location:  Lower extremity   Lower extremity location:  Buttock   Buttock location:  L buttock Pre-procedure details:    Skin preparation:  Povidone-iodine Sedation:    Sedation type:  None Anesthesia:    Anesthesia method:  Local infiltration   Local anesthetic:  Lidocaine 1% w/o epi Procedure type:    Complexity:  Complex Procedure details:    Ultrasound guidance: no     Incision types:  Single straight   Wound management:  Probed and deloculated   Drainage:  Purulent and bloody   Drainage amount:  Copious   Wound treatment:  Wound left open   Packing materials:  None Post-procedure details:    Procedure completion:  Tolerated  (including critical care time)  Medications Ordered in UC Medications  acetaminophen (TYLENOL) tablet 650 mg (650 mg Oral Given 07/21/22 1934)    Initial Impression / Assessment and Plan / UC Course  I have reviewed the triage vital signs and the nursing notes.  Pertinent labs & imaging results that were available during my care of the patient were reviewed by me and considered  in my medical decision making (see chart for details).  Vitals in triage reviewed, patient is hemodynamically stable.  Afebrile, w/o tachycardia, low concern for systemic infection. Abscess present to left buttock with fluctuance and surrounding induration.  Local numbing completed with 1% lidocaine, incision and drainage completed with 11 blade, copious amounts of purulent bloody discharge.  Wound culture sent.  Surrounding area probed and deloculated with 11 blade and hemostats.  Surrounding induration remains.  Will place on doxycycline.  Wound to self-close. Return precautions and follow-up care discussed, patient verbalized understanding.    Final Clinical Impressions(s) / UC Diagnoses   Final diagnoses:  Abscess of buttock, left     Discharge Instructions      Today we did an incision and drainage on your abscess to your left buttock. We got a good amount of blood, fluid and pus out. Please take all antibiotics as prescribed. Keep area clean and dry. You will experience drainage from your abscess this, this is normal.  You did have a significant area that was indurated, not yet purulent or ready to drain.  If this area persists despite antibiotics and warm compress, you may return to clinic for reevaluation.   Please return to clinic if no improvement, you develop fever, nausea, vomiting or worsening of symptoms.       ED Prescriptions     Medication Sig Dispense Auth. Provider   doxycycline (VIBRAMYCIN) 100 MG capsule Take 1 capsule (100 mg total)  by mouth 2 (two) times daily. 20 capsule Dorean Hiebert, [place] N, [place]      I have reviewed the PDMP during this encounter.   Telvin Reinders, [place] N, [place] 07/21/22 [phone]

## 2022-07-21 NOTE — Discharge Instructions (Addendum)
Today we did an incision and drainage on your abscess to your left buttock. We got a good amount of blood, fluid and pus out. Please take all antibiotics as prescribed. Keep area clean and dry. You will experience drainage from your abscess this, this is normal.  You did have a significant area that was indurated, not yet purulent or ready to drain.  If this area persists despite antibiotics and warm compress, you may return to clinic for reevaluation.   Please return to clinic if no improvement, you develop fever, nausea, vomiting or worsening of symptoms.

## 2022-07-21 NOTE — ED Triage Notes (Signed)
Pt states he has a boil on his left buttock x 1 week, his mom states that it has been draining.

## 2022-07-22 ENCOUNTER — Ambulatory Visit (HOSPITAL_COMMUNITY)
Admission: EM | Admit: 2022-07-22 | Discharge: 2022-07-22 | Disposition: A | Discharge status: Home / Self Care | Payer: 59 | Attending: Emergency Medicine | Admitting: Emergency Medicine

## 2022-07-22 DIAGNOSIS — L0231 Cutaneous abscess of buttock: Secondary | ICD-10-CM | POA: Insufficient documentation

## 2022-07-24 LAB — AEROBIC CULTURE W GRAM STAIN (SUPERFICIAL SPECIMEN)

## 2022-07-29 ENCOUNTER — Emergency Department (HOSPITAL_BASED_OUTPATIENT_CLINIC_OR_DEPARTMENT_OTHER)
Admission: EM | Admit: 2022-07-29 | Discharge: 2022-07-29 | Disposition: A | Discharge status: Home / Self Care | Payer: 59 | Attending: Emergency Medicine | Admitting: Emergency Medicine

## 2022-07-29 ENCOUNTER — Emergency Department (HOSPITAL_BASED_OUTPATIENT_CLINIC_OR_DEPARTMENT_OTHER): Payer: 59

## 2022-07-29 ENCOUNTER — Other Ambulatory Visit: Payer: Self-pay

## 2022-07-29 ENCOUNTER — Encounter (HOSPITAL_BASED_OUTPATIENT_CLINIC_OR_DEPARTMENT_OTHER): Payer: Self-pay | Admitting: Emergency Medicine

## 2022-07-29 DIAGNOSIS — Z1152 Encounter for screening for COVID-19: Secondary | ICD-10-CM | POA: Insufficient documentation

## 2022-07-29 DIAGNOSIS — Z940 Kidney transplant status: Secondary | ICD-10-CM | POA: Diagnosis not present

## 2022-07-29 DIAGNOSIS — N3289 Other specified disorders of bladder: Secondary | ICD-10-CM | POA: Diagnosis not present

## 2022-07-29 DIAGNOSIS — N289 Disorder of kidney and ureter, unspecified: Secondary | ICD-10-CM | POA: Diagnosis not present

## 2022-07-29 DIAGNOSIS — Z7982 Long term (current) use of aspirin: Secondary | ICD-10-CM | POA: Insufficient documentation

## 2022-07-29 DIAGNOSIS — Z905 Acquired absence of kidney: Secondary | ICD-10-CM | POA: Insufficient documentation

## 2022-07-29 DIAGNOSIS — R112 Nausea with vomiting, unspecified: Secondary | ICD-10-CM

## 2022-07-29 DIAGNOSIS — L0231 Cutaneous abscess of buttock: Secondary | ICD-10-CM

## 2022-07-29 DIAGNOSIS — K8020 Calculus of gallbladder without cholecystitis without obstruction: Secondary | ICD-10-CM | POA: Diagnosis not present

## 2022-07-29 DIAGNOSIS — I700 Atherosclerosis of aorta: Secondary | ICD-10-CM | POA: Insufficient documentation

## 2022-07-29 DIAGNOSIS — R509 Fever, unspecified: Secondary | ICD-10-CM | POA: Diagnosis not present

## 2022-07-29 LAB — COMPREHENSIVE METABOLIC PANEL
ALT: 9 U/L (ref 0–44)
AST: 9 U/L — ABNORMAL LOW (ref 15–41)
Albumin: 4 g/dL (ref 3.5–5.0)
Alkaline Phosphatase: 73 U/L (ref 38–126)
Anion gap: 11 (ref 5–15)
BUN: 32 mg/dL — ABNORMAL HIGH (ref 6–20)
CO2: 18 mmol/L — ABNORMAL LOW (ref 22–32)
Calcium: 9.7 mg/dL (ref 8.9–10.3)
Chloride: 108 mmol/L (ref 98–111)
Creatinine, Ser: 2.65 mg/dL — ABNORMAL HIGH (ref 0.61–1.24)
GFR, Estimated: 29 mL/min — ABNORMAL LOW (ref >60)
Glucose, Bld: 113 mg/dL — ABNORMAL HIGH (ref 70–99)
Potassium: 4.2 mmol/L (ref 3.5–5.1)
Sodium: 137 mmol/L (ref 135–145)
Total Bilirubin: 0.7 mg/dL (ref 0.3–1.2)
Total Protein: 7.2 g/dL (ref 6.5–8.1)

## 2022-07-29 LAB — RESP PANEL BY RT-PCR (RSV, FLU A&B, COVID)  RVPGX2
Influenza A by PCR: NEGATIVE
Influenza B by PCR: NEGATIVE
Resp Syncytial Virus by PCR: NEGATIVE
SARS Coronavirus 2 by RT PCR: NEGATIVE

## 2022-07-29 LAB — URINALYSIS, ROUTINE W REFLEX MICROSCOPIC
Bacteria, UA: NONE SEEN
Bilirubin Urine: NEGATIVE
Glucose, UA: NEGATIVE mg/dL
Hgb urine dipstick: NEGATIVE
Leukocytes,Ua: NEGATIVE
Nitrite: NEGATIVE
Protein, ur: 30 mg/dL — AB
Specific Gravity, Urine: 1.011 (ref 1.005–1.030)
pH: 6.5 (ref 5.0–8.0)

## 2022-07-29 LAB — LIPASE, BLOOD: Lipase: 34 U/L (ref 11–51)

## 2022-07-29 LAB — CBC
HCT: 34.2 % — ABNORMAL LOW (ref 39.0–52.0)
Hemoglobin: 11.8 g/dL — ABNORMAL LOW (ref 13.0–17.0)
MCH: 29.7 pg (ref 26.0–34.0)
MCHC: 34.5 g/dL (ref 30.0–36.0)
MCV: 86.1 fL (ref 80.0–100.0)
Platelets: 255 10*3/uL (ref 150–400)
RBC: 3.97 MIL/uL — ABNORMAL LOW (ref 4.22–5.81)
RDW: 12.7 % (ref 11.5–15.5)
WBC: 14.3 10*3/uL — ABNORMAL HIGH (ref 4.0–10.5)
nRBC: 0 % (ref 0.0–0.2)

## 2022-07-29 MED ORDER — ONDANSETRON HCL 4 MG/2ML IJ SOLN
4.0000 mg | Freq: Once | INTRAMUSCULAR | Status: AC
  Administered 2022-07-29: 4 mg via INTRAVENOUS
  Filled 2022-07-29: qty 2

## 2022-07-29 MED ORDER — ONDANSETRON 4 MG PO TBDP: 4.0000 mg | ORAL_TABLET | Freq: Three times a day (TID) | ORAL | 0 refills | Status: DC | PRN

## 2022-07-29 MED ORDER — CLINDAMYCIN HCL 300 MG PO CAPS: 300.0000 mg | ORAL_CAPSULE | Freq: Three times a day (TID) | ORAL | 0 refills | Status: DC

## 2022-07-29 MED ORDER — CEPHALEXIN 500 MG PO CAPS: 500.0000 mg | ORAL_CAPSULE | Freq: Three times a day (TID) | ORAL | 0 refills | Status: AC

## 2022-07-29 MED ORDER — LACTATED RINGERS IV BOLUS
1000.0000 mL | Freq: Once | INTRAVENOUS | Status: AC
  Administered 2022-07-29: 1000 mL via INTRAVENOUS

## 2022-07-29 NOTE — ED Provider Notes (Signed)
[facility] Provider Note   CSN: [id number] Arrival date & time: 07/29/22  1046     History  Chief Complaint  Patient presents with   Emesis    Tyler Alvarez is a 47 y.o. male.  Patient is a 47 year old male with a past medical history of tuberous sclerosis, renal failure status post kidney transplant several years ago on immunosuppression, seizure disorder presenting to the emergency department with nausea and vomiting.  The patient states that he has had nausea and vomiting for the past several days.  He states he has not been able to take his medications for the last 2 days because he has been unable to keep anything down.  He states that prior to his symptoms starting he was diagnosed with a buttock abscess and was started on antibiotics.  He states that he felt feverish at home.  He states he has had no increasing pain from his buttocks.  He denies any abdominal pain, diarrhea or constipation, dysuria or hematuria but does report that his urine has appeared dark.  The history is provided by the patient and a parent.  Emesis      Home Medications Prior to Admission medications   Medication Sig Start Date End Date Taking? Authorizing Provider  acetaminophen (TYLENOL) 500 MG tablet Take 1,000 mg by mouth daily as needed for mild pain, headache or fever.    [provider]  aspirin EC 81 MG tablet Take 81 mg by mouth daily. Swallow whole.    [provider]  brompheniramine-pseudoephedrine (DIMETAPP) 1-15 MG/5ML ELIX Take 20 mLs by mouth daily as needed for allergies (Cold).    [provider]  Cholecalciferol (VITAMIN D3 PO) Take 1 tablet by mouth daily.    [provider]  divalproex (DEPAKOTE) 250 MG DR tablet TAKE 1 TABLET BY MOUTH TWICE A DAY 04/26/22   Ganta, Anupa, DO  doxycycline (VIBRAMYCIN) 100 MG capsule Take 1 capsule (100 mg total) by mouth 2 (two) times daily. 07/21/22   [place], [place]  N, FNP  gabapentin (NEURONTIN) 300 MG capsule TAKE 1 CAPSULE BY MOUTH EVERYDAY AT BEDTIME 06/26/22   Ganta, Anupa, DO  imipramine (TOFRANIL) 25 MG tablet Take 50 mg by mouth at bedtime.     [provider]  losartan (COZAAR) 25 MG tablet Take 25 mg by mouth daily. 03/31/21   [provider]  predniSONE (DELTASONE) 5 MG tablet Take 5 mg by mouth daily.     [provider]  tacrolimus (PROGRAF) 1 MG capsule Take 2 mg by mouth 2 (two) times daily.     [provider]      Allergies    Erythromycin and Sulfa antibiotics    Review of Systems   Review of Systems  Gastrointestinal:  Positive for vomiting.    Physical Exam Updated Vital Signs BP (!) 142/93   Pulse 90   Temp 99 F (37.2 C) (Oral)   Resp 16   SpO2 96%  Physical Exam Vitals and nursing note reviewed.  Constitutional:      General: He is not in acute distress.    Appearance: Normal appearance.  HENT:     Head: Normocephalic and atraumatic.     Nose: Nose normal.     Mouth/Throat:     Mouth: Mucous membranes are moist.     Pharynx: Oropharynx is clear.  Eyes:     Extraocular Movements: Extraocular movements intact.     Conjunctiva/sclera: Conjunctivae normal.  Cardiovascular:     Rate and Rhythm: Normal rate and regular rhythm.     Heart sounds: Normal heart sounds.  Pulmonary:     Effort: Pulmonary effort is normal.     Breath sounds: Normal breath sounds.  Abdominal:     General: Abdomen is flat.     Palpations: Abdomen is soft.     Tenderness: There is no abdominal tenderness.  Musculoskeletal:        General: Normal range of motion.     Cervical back: Normal range of motion and neck supple.  Skin:    General: Skin is warm and dry.  Neurological:     General: No focal deficit present.     Mental Status: He is alert and oriented to person, place, and time.  Psychiatric:        Mood and Affect: Mood normal.        Behavior: Behavior normal.     ED Results /  Procedures / Treatments   Labs (all labs ordered are listed, but only abnormal results are displayed) Labs Reviewed  COMPREHENSIVE METABOLIC PANEL - Abnormal; Notable for the following components:      Result Value   CO2 18 (*)    Glucose, Bld 113 (*)    BUN 32 (*)    Creatinine, Ser 2.65 (*)    AST 9 (*)    GFR, Estimated 29 (*)    All other components within normal limits  CBC - Abnormal; Notable for the following components:   WBC 14.3 (*)    RBC 3.97 (*)    Hemoglobin 11.8 (*)    HCT 34.2 (*)    All other components within normal limits  RESP PANEL BY RT-PCR (RSV, FLU A&B, COVID)  RVPGX2  LIPASE, BLOOD  URINALYSIS, ROUTINE W REFLEX MICROSCOPIC    EKG None  Radiology CT ABDOMEN PELVIS WO CONTRAST  Result Date: 07/29/2022 CLINICAL DATA:  Abdominal pain. EXAM: CT ABDOMEN AND PELVIS WITHOUT CONTRAST TECHNIQUE: Multidetector CT imaging of the abdomen and pelvis was performed following the standard protocol without IV contrast. RADIATION DOSE REDUCTION: This exam was performed according to the departmental dose-optimization program which includes automated exposure control, adjustment of the mA and/or kV according to patient size and/or use of iterative reconstruction technique. COMPARISON:  None Available. FINDINGS: Lower chest: Unremarkable. Hepatobiliary: No suspicious focal abnormality in the liver on this study without intravenous contrast. Tiny calcified gallstones evident. No intrahepatic or extrahepatic biliary dilation. Pancreas: No focal mass lesion. No dilatation of the main duct. No intraparenchymal cyst. No peripancreatic edema. Spleen: No splenomegaly. No focal mass lesion. Adrenals/Urinary Tract: No adrenal nodule or mass. Bilateral nephrectomy. Transplant kidney noted left pelvis without hydronephrosis. 6 mm subcapsular hyperattenuating lesion in the interpolar region cannot be definitively characterized but is similar to study from 09/06/2019, compatible with  proteinaceous or hemorrhagic cyst. No followup imaging is recommended. Similar 4 mm subcapsular lesion on 58/2 was also present previously. 2.7 cm low-density lesion inferior pole transplant kidney was also present on the prior study in approaches water density today compatible with a simple cyst. No followup imaging is recommended. Bladder is under distended with probable circumferential bladder wall thickening. Stomach/Bowel: Stomach is unremarkable. No gastric wall thickening. No evidence of outlet obstruction. Duodenum is normally positioned as is the ligament of Treitz. No small bowel wall thickening. No small bowel dilatation. The terminal ileum is normal. The appendix is normal. No gross colonic mass. No colonic wall thickening. Vascular/Lymphatic: There is mild  atherosclerotic calcification of the abdominal aorta without aneurysm. There is no gastrohepatic or hepatoduodenal ligament lymphadenopathy. No retroperitoneal or mesenteric lymphadenopathy. No pelvic sidewall lymphadenopathy. Reproductive: The prostate gland and seminal vesicles are unremarkable. Other: No intraperitoneal free fluid. Musculoskeletal: 4.4 x 2.2 cm subcutaneous lesion in the left gluteal region is new in the interval although there is extensive edema/inflammation in this region on the prior study. This could be a new abscess. Soft tissue lesion considered less likely. Fluid collection to the right of the intergluteal fold on the previous study has decreased substantially in the interval measuring 10 mm today compared to 24 mm previously. No worrisome lytic or sclerotic osseous abnormality. IMPRESSION: 1. 4.4 x 2.2 cm subcutaneous lesion in the left gluteal region is new in the interval although there was extensive edema/inflammation in this region on the prior study. This could be a new abscess. Soft tissue lesion considered less likely. 2. Fluid collection to the right of the intergluteal fold on the previous study has decreased  substantially in the interval measuring 10 mm today compared to 24 mm previously. 3. Cholelithiasis. 4. Bilateral nephrectomy with left pelvic transplant kidney. No hydronephrosis. 5. Similar circumferential bladder wall thickening. 6.  Aortic Atherosclerosis (ICD10-I70.0). Electronically Signed   By: Misty Stanley M.D.   On: 07/29/2022 12:56    Procedures Procedures    Medications Ordered in ED Medications  lactated ringers bolus 1,000 mL (has no administration in time range)  lactated ringers bolus 1,000 mL ( Intravenous Stopped 07/29/22 1408)  ondansetron (ZOFRAN) injection 4 mg (4 mg Intravenous Given 07/29/22 1307)    ED Course/ Medical Decision Making/ A&P Clinical Course as of 07/29/22 1531  Sat Jul 29, 2022  1518 Patient's CT shows evidence of continued buttock abscess.  The patient's wound is still open on my exam and I was able to express a moderate amount of purulence.  He still is mild surrounding erythema and warmth.  He is on doxycycline for an additional 4 days to complete his course as prescribed by urgent care.  He had no other acute findings on his CT.  Patient reports improvement of his nausea but has not been able to urinate yet.  He will be given a p.o. challenge.  He is also requesting viral testing. [VK]  1530 Patient signed out to Dr. Ronnald Nian pending urine and reassessment. [VK]    Clinical Course User Index [VK] Kemper Durie, DO                             Medical Decision Making This patient presents to the ED with chief complaint(s) of vomiting with pertinent past medical history of tuberous sclerosis, renal failure status post kidney transplant, seizure disorder which further complicates the presenting complaint. The complaint involves an extensive differential diagnosis and also carries with it a high risk of complications and morbidity.    The differential diagnosis includes dehydration, electrolyte abnormality, hepatitis, pancreatitis, gastroenteritis,  no signs of sepsis on exam, UTI, intra-abdominal infection, renal failure, rejection  Additional history obtained: Additional history obtained from family Records reviewed Care Everywhere/External Records  ED Course and Reassessment: On patient's arrival he is mildly uncomfortable appearing but in no acute distress and appears mildly dry.  He will be started on IV fluids and given Zofran for symptomatic management.  He will have labs and urine performed as well as CT imaging to evaluate for intra-abdominal infection as causes of his symptoms and  he will be closely reassessed.  Independent labs interpretation:  The following labs were independently interpreted: leukocytosis, Cr at baseline  Independent visualization of imaging: - I independently visualized the following imaging with scope of interpretation limited to determining acute life threatening conditions related to emergency care: CTAP, which revealed no acute intraabdominal abnormality, abscess of L buttock  Consultation: - Consulted or discussed management/test interpretation w/ external professional: N/A     Amount and/or Complexity of Data Reviewed Labs: ordered. Radiology: ordered.  Risk Prescription drug management.           Final Clinical Impression(s) / ED Diagnoses Final diagnoses:  Nausea and vomiting, unspecified vomiting type  Left buttock abscess    Rx / DC Orders ED Discharge Orders     None         Kemper Durie, DO 07/29/22 1531

## 2022-07-29 NOTE — ED Notes (Signed)
Pt is aware urine needs to be collected. Pt stated he is unable to go at this time.

## 2022-07-29 NOTE — ED Triage Notes (Signed)
Pt reports emesis, fever, and poor PO intake that started yesterday.

## 2022-07-29 NOTE — ED Provider Notes (Signed)
Patient discharged with clindamycin prescription to go along with doxycycline.  Feeling better.  Will follow-up with general surgery.  Given prescription for Zofran.   Lennice Sites, DO 07/29/22 1711

## 2022-07-29 NOTE — ED Notes (Signed)
Pt states unable to give urine sample at this time 

## 2022-07-29 NOTE — Discharge Instructions (Addendum)
Continue warm compresses. Continue antibiotics, including new one prescribed today. Follow up with general surgery.

## 2022-07-29 NOTE — ED Notes (Signed)
Pt currently unable to provide urine sample. Urinal at bedside and instructed pt to press call button after he uses it.

## 2022-07-29 NOTE — ED Notes (Signed)
Pt still currently unable to provide urine sample.

## 2022-07-30 ENCOUNTER — Other Ambulatory Visit: Payer: Self-pay | Admitting: Family Medicine

## 2022-08-03 ENCOUNTER — Telehealth: Payer: Self-pay

## 2022-08-03 NOTE — Telephone Encounter (Signed)
        Patient  visited [facility] on 3/16    Telephone encounter attempt :  1st  A HIPAA compliant voice message was left requesting a return call.  Instructed patient to call back .    [facility] [phone] [address], [place], [place] [address] Phone: [phone] Email: [email or web address]

## 2022-08-04 ENCOUNTER — Telehealth: Payer: Self-pay

## 2022-08-04 NOTE — Telephone Encounter (Signed)
        Patient  visited [facility] on 3/16     Telephone encounter attempt :  2nd  A HIPAA compliant voice message was left requesting a return call.  Instructed patient to call back .    [facility] [phone] [address], [place], [place] [address] Phone: [phone] Email: [email or web address]

## 2022-08-07 ENCOUNTER — Telehealth: Payer: Self-pay

## 2022-08-07 NOTE — Telephone Encounter (Signed)
     Patient  visit on  3/16 at [facility] you been able to follow up with your primary care physician? No    The patient was or was not able to obtain any needed medicine or equipment. Yes   Are there diet recommendations that you are having difficulty following? Na   Patient expresses understanding of discharge instructions and education provided has no other needs at this time.  Yes     [facility] [phone] [address], [place], [place] [address] Phone: [phone] Email: [email or web address]

## 2022-08-10 DIAGNOSIS — L0231 Cutaneous abscess of buttock: Secondary | ICD-10-CM | POA: Diagnosis not present

## 2022-08-10 DIAGNOSIS — L723 Sebaceous cyst: Secondary | ICD-10-CM | POA: Diagnosis not present

## 2022-08-10 DIAGNOSIS — L089 Local infection of the skin and subcutaneous tissue, unspecified: Secondary | ICD-10-CM | POA: Diagnosis not present

## 2022-08-28 DIAGNOSIS — L0231 Cutaneous abscess of buttock: Secondary | ICD-10-CM | POA: Diagnosis not present

## 2022-09-06 NOTE — Unmapped (Signed)
[facility] Refill Coordination Note    Specialty Medication(s) to be Shipped:   Transplant: Prograf 1mg  and Prednisone 5mg     Other medication(s) to be shipped: No additional medications requested for fill at this time     Laden Presswood, DOB: 10/24/1975  Phone: [phone] (home)       All above HIPAA information was verified with patient's family member, sister.     Was a Nurse, learning disability used for this call? No    Completed refill call assessment today to schedule patient's medication shipment from the [facility] [phone]).  All relevant notes have been reviewed.     Specialty medication(s) and dose(s) confirmed: Regimen is correct and unchanged.   Changes to medications: Harl reports no changes at this time.  Changes to insurance: No  New side effects reported not previously addressed with a pharmacist or physician: None reported  Questions for the pharmacist: No    Confirmed patient received a Conservation officer, historic buildings and a Surveyor, mining with first shipment. The patient will receive a drug information handout for each medication shipped and additional FDA Medication Guides as required.       DISEASE/MEDICATION-SPECIFIC INFORMATION        N/A    SPECIALTY MEDICATION ADHERENCE     Medication Adherence    Patient reported X missed doses in the last month: 0  Specialty Medication: predniSONE 5 MG tablet (DELTASONE)  Patient is on additional specialty medications: Yes  Additional Specialty Medications: PROGRAF 1 MG capsule (tacrolimus)  Patient Reported Additional Medication X Missed Doses in the Last Month: 0  Patient is on more than two specialty medications: No  Adherence tools used: patient uses a pill box to manage medications  Support network for adherence: family member              Were doses missed due to medication being on hold? No    prednisone 5 mg: 12 days of medicine on hand   Prograf 1 mg: 12 days of medicine on hand       REFERRAL TO PHARMACIST Referral to the pharmacist: Not needed      [facility]     Shipping address confirmed in Epic.     Delivery Scheduled: Yes, Expected medication delivery date: 09/14/22.     Medication will be delivered via UPS to the prescription address in Epic WAM.    Ernestine Mcmurray   [facility] Specialty Technician

## 2022-09-08 ENCOUNTER — Telehealth: Payer: Self-pay | Admitting: Family Medicine

## 2022-09-08 NOTE — Telephone Encounter (Signed)
Called patient to schedule Medicare Annual Wellness Visit (AWV). Left message for patient to call back and schedule Medicare Annual Wellness Visit (AWV).  Last date of AWV: 08/24/2015  Please schedule an AWVS appointment at any time with [facility] VISIT .  If any questions, please contact me at [phone].    Thank you,  [facility] Support [facility] Direct dial  [phone]

## 2022-09-13 MED FILL — PREDNISONE 5 MG TABLET: ORAL | 90 days supply | Qty: 90 | Fill #1

## 2022-09-13 MED FILL — PROGRAF 1 MG CAPSULE: ORAL | 90 days supply | Qty: 360 | Fill #1

## 2022-10-04 NOTE — Unmapped (Signed)
Hi,     Shelby(mother) contacted the Communication Center requesting to speak with the care team of Oscar [name] to discuss:    -needing to reschedule patient's 10/05/22 CT+clinic visit. The pulmonary clinic said they could not do this. I am unsure of who reschedules.     Please contact Shelby  at [phone].      [x]  Preferred Name   [x]  DOB and/or MR#  [x]  Preferred Contact Method  [x]  Phone Number(s)   []  MyChart     Thank you,   Durward Fortes  [facility]   [phone]

## 2022-10-10 NOTE — Unmapped (Signed)
Pt's mom called as they needed to cancel/reschedule his appts. They rescheduled his CT and pulmonary appt. She wanted to know if they needed to reschedule the 5/30 appt with Dr. Toni Arthurs. I told her is he is seeing Dr. Marisue Humble soon that he does not. He has appt with them on 10/25/22 he will be seen then. She stated Oscar [name] passed out last week and she tried to catch him and they both got kind of banged up so they wanted to reschedule. She said he is feeling fine now. I told her to keep the appt with Dr. Marisue Humble and we would not reschedule with Dr. Toni Arthurs.

## 2022-10-19 DIAGNOSIS — Z940 Kidney transplant status: Secondary | ICD-10-CM | POA: Diagnosis not present

## 2022-10-24 ENCOUNTER — Other Ambulatory Visit: Payer: Self-pay | Admitting: Family Medicine

## 2022-10-24 DIAGNOSIS — Z87898 Personal history of other specified conditions: Secondary | ICD-10-CM

## 2022-11-09 ENCOUNTER — Ambulatory Visit: Admit: 2022-11-09 | Discharge: 2022-11-09 | Payer: MEDICARE

## 2022-11-09 ENCOUNTER — Ambulatory Visit: Admit: 2022-11-09 | Discharge: 2022-11-09 | Payer: MEDICARE | Attending: Surgery | Primary: Surgery

## 2022-11-09 DIAGNOSIS — R911 Solitary pulmonary nodule: Principal | ICD-10-CM

## 2022-11-09 DIAGNOSIS — I2510 Atherosclerotic heart disease of native coronary artery without angina pectoris: Secondary | ICD-10-CM | POA: Diagnosis not present

## 2022-11-09 DIAGNOSIS — J9811 Atelectasis: Secondary | ICD-10-CM | POA: Diagnosis not present

## 2022-11-09 DIAGNOSIS — J432 Centrilobular emphysema: Secondary | ICD-10-CM | POA: Diagnosis not present

## 2022-11-09 NOTE — Unmapped (Signed)
Follow up as needed

## 2022-11-10 NOTE — Unmapped (Signed)
Thoracic Surgery Clinic Note    Subjective:  Today I saw Mr. Hartsell in follow-up for his left lower lobe ground glass opacities identified in a CTA from January 2024. He remains asymptomatic. A repeat CT performed today demonstrates unchanged tuberosclerosis associated lung disease with multifocal micronodular pneumocyte hyperplasia and near complete resolution of his left lower lobe lung findings.    Objective:  On examination, Mr. Butta is a well-nourished, well-developed male.  Vital Signs: Pulse 90  - Wt 55.8 kg (123 lb)  - SpO2 99%  - BMI 17.16 kg/m??  Body mass index is 17.16 kg/m??. His pulse is regular, breathing is unlabored and temperature is afrebrile.  In general he is in no acute distress. The lung fields are clear to auscultation bilaterally.  Heart rate and rhythm are normal, and there is no murmur. The abdomen is soft and nontender. There is no peripheral edema of the extremities.  Neurologic examination is grossly intact.    Assessment/Plan:  In summary, Mr. Kucera CT scans have demonstrated resolution of the inflammatory process in his left lower lobe. He remains asymptomatic. I will plan to see him on an as needed basis.    I personally spent 15 minutes face-to-face and non-face-to-face in the care of this patient, which includes all pre, intra, and post visit time on the date of service.  All documented time was specific to the E/M visit and does not include any procedures that may have been performed.    Merrie Roof MD

## 2022-11-15 IMAGING — CT CT PELVIS W/O CM
2 of 3 series · 16 of 46 positions shown, 18 images · non-contrast
Comparison: 09/06/2019

CLINICAL DATA: Abscess, anal or rectal bilateral buttocks
abscesses. No IV contrast due to low GFR.

EXAM:
CT PELVIS WITHOUT CONTRAST
TECHNIQUE: Multidetector CT imaging of the pelvis was performed following the
standard protocol without intravenous contrast.

[Series 2: abd pel wo · axial · 0.71mm/px · z∈[-531,-236]mm · 13 of 69 slices shown, 15 images]
[im 5/69  soft-tissue]
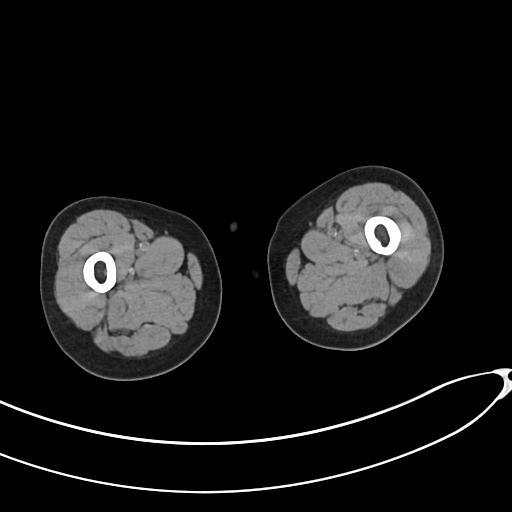
[im 5/69  bone]
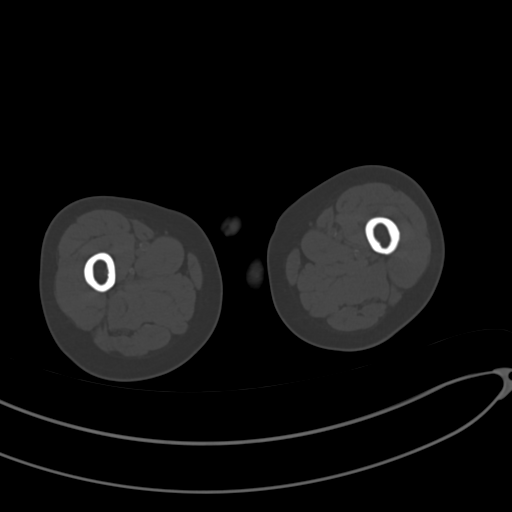
[im 9/69  soft-tissue]
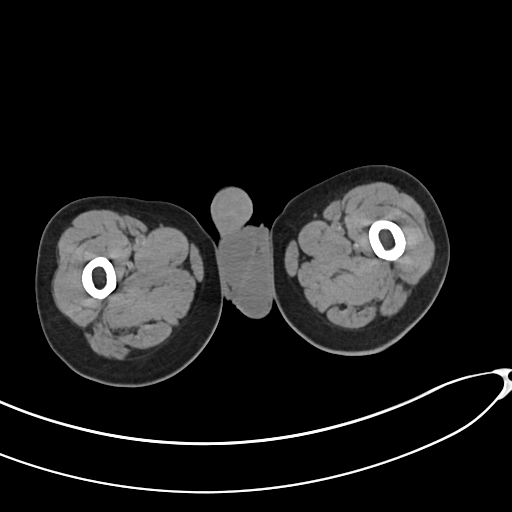
[im 14/69  soft-tissue]
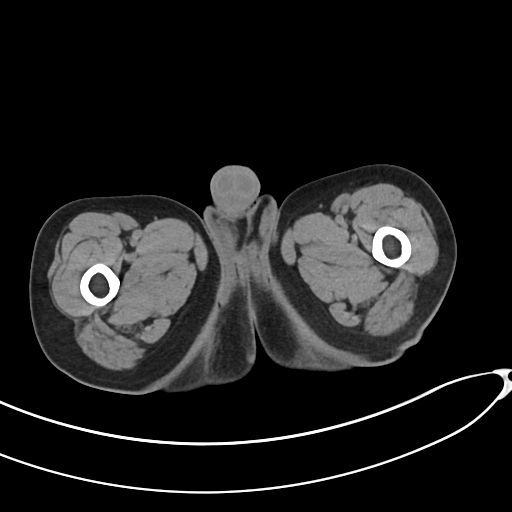
[im 20/69  soft-tissue]
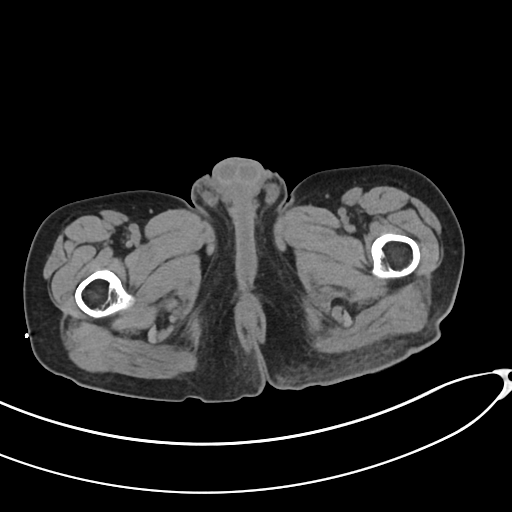
[im 25/69  soft-tissue]
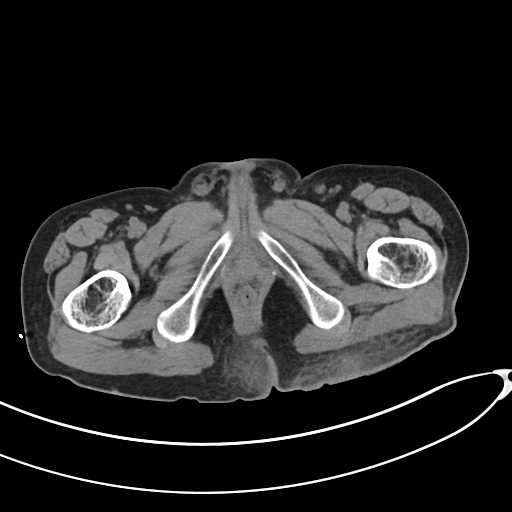
[im 29/69  soft-tissue]
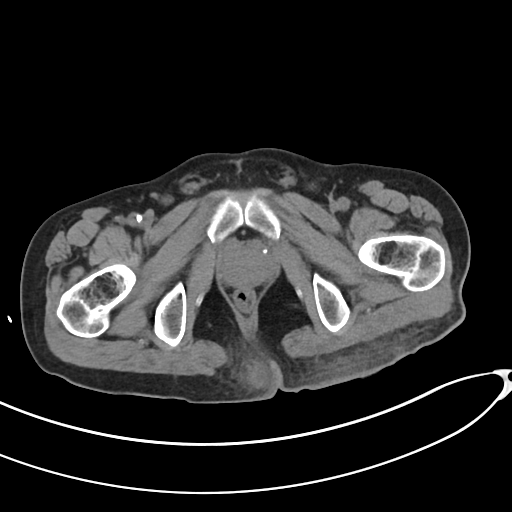
[im 36/69  soft-tissue]
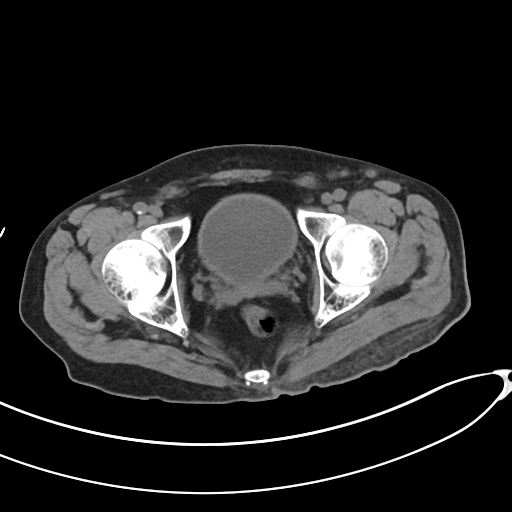
[im 40/69  soft-tissue]
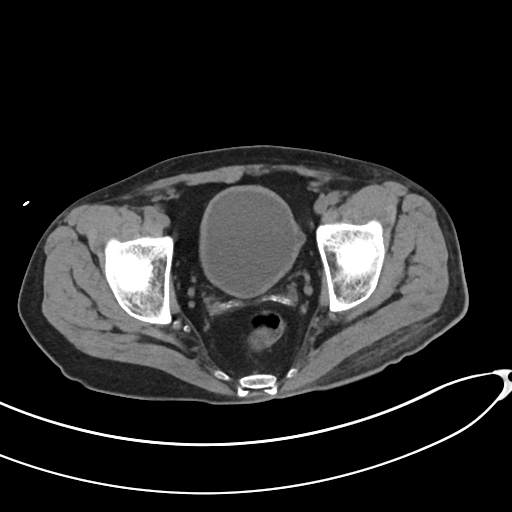
[im 44/69  soft-tissue]
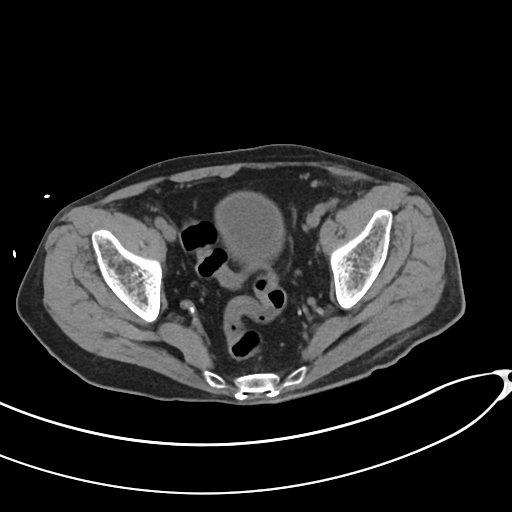
[im 44/69  bone]
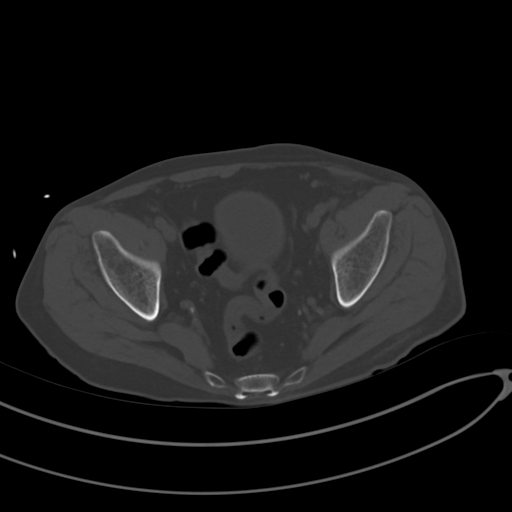
[im 49/69  soft-tissue]
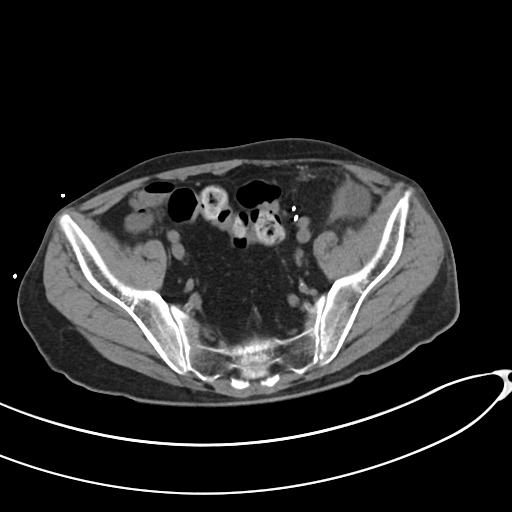
[im 55/69  soft-tissue]
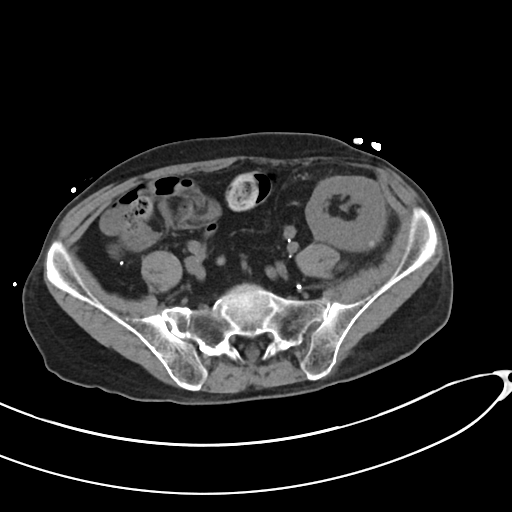
[im 60/69  soft-tissue]
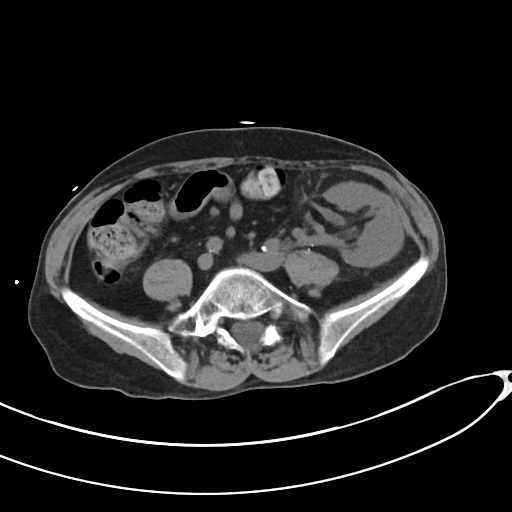
[im 64/69  soft-tissue]
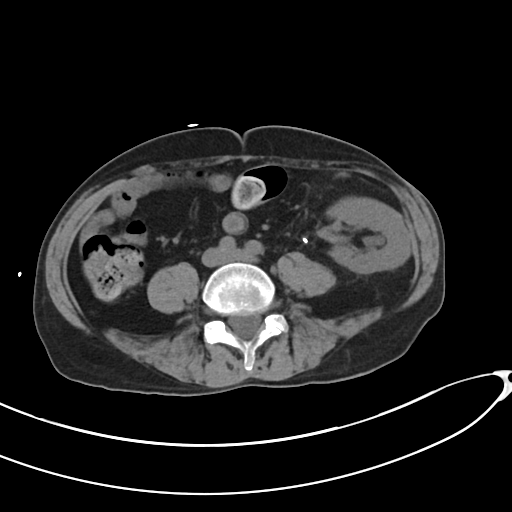

[Series 4: coronal · coronal · 0.67mm/px · 3 of 68 slices shown]
[im 23/68  soft-tissue]
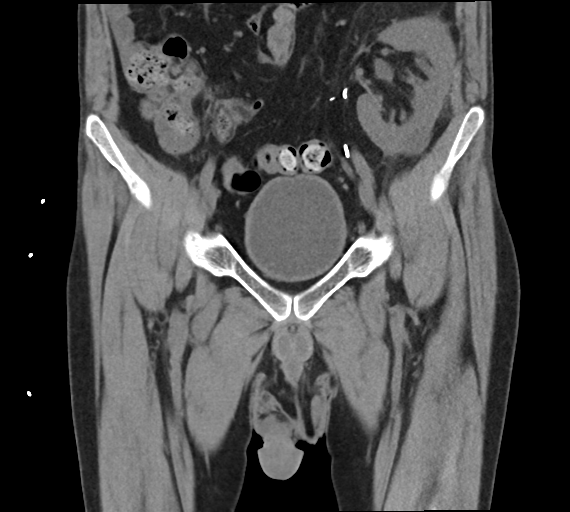
[im 30/68  soft-tissue]
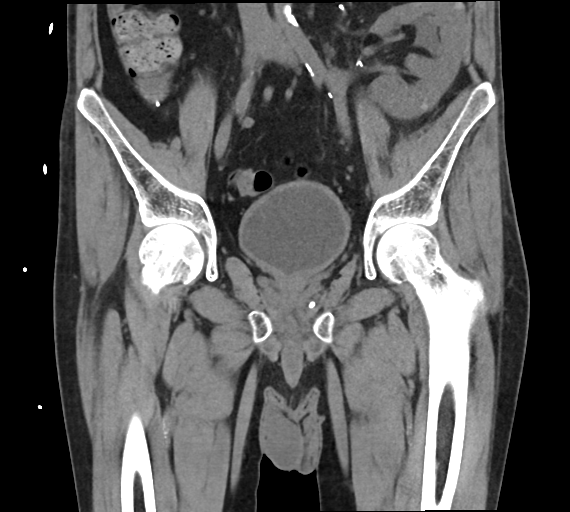
[im 38/68  soft-tissue]
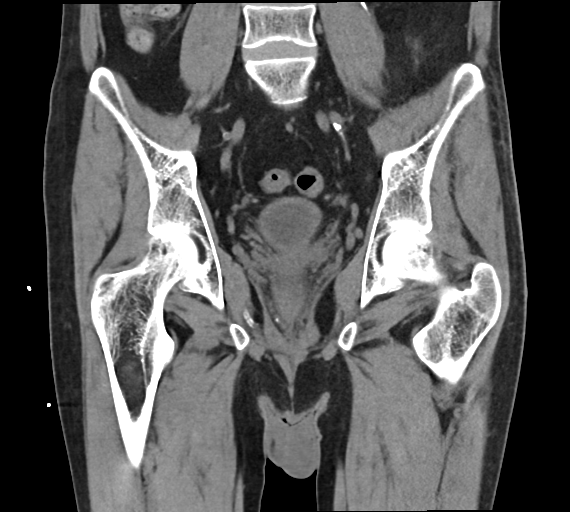

[16 of 46 positions shown; findings below may reference images not displayed]

FINDINGS: Urinary Tract: Bladder wall is mildly thickened, possibly cystitis.
Pelvic kidney, likely a transplant. No hydronephrosis or
hydroureter.

Bowel: Visualized portions of large and small bowel are not
abnormally distended. No wall thickening or inflammatory changes.
Appendix appears normal.

Vascular/Lymphatic: Mild calcification in the aorta and iliac
arteries. No aneurysm.

Reproductive: Prostate gland is not enlarged. Visualized scrotal
contents are unremarkable.

Other: No free air or free fluid in the abdomen. Abdominal wall
musculature appears intact.

Perineal collection in the subcutaneous soft tissues to the right of
the gluteal crease, measuring 2.4 cm diameter. Surrounding
infiltration in the subcutaneous fat. Changes are consistent with
soft tissue abscess. No fistulas identified. There are 2 additional
collections in the subcutaneous fat superficial to the inferior left
gluteal muscles. One of the collections posterior to the ischium
measures about 1.4 cm and a lateral collection measures about 8 mm
in diameter. Skin thickening and stranding in the subcutaneous fat
consistent with cellulitis. No fistulas identified.

Musculoskeletal: Bones appear intact. No cortical destruction or
erosion to suggest osteomyelitis.
IMPRESSION: 1. Subcutaneous soft tissue collections in the peroneal fat to the
right of the gluteal crease and over the distal left gluteal muscles
with surrounding stranding. Changes are consistent with soft tissue
abscesses and cellulitis. No fistulas identified.
2. Left transplant pelvic kidney without hydronephrosis.
3. Bladder wall is mildly thickened, possibly cystitis.

## 2022-11-29 NOTE — Unmapped (Signed)
[facility] Refill Coordination Note    Specialty Medication(s) to be Shipped:   Transplant: Prograf 1mg  and Prednisone 5mg     Other medication(s) to be shipped: No additional medications requested for fill at this time     Oscar [name], DOB: 12-Nov-1975  Phone: [phone] (home)       All above HIPAA information was verified with patient.     Was a Nurse, learning disability used for this call? No    Completed refill call assessment today to schedule patient's medication shipment from the [facility] [phone]).  All relevant notes have been reviewed.     Specialty medication(s) and dose(s) confirmed: Regimen is correct and unchanged.   Changes to medications: Aysen reports no changes at this time.  Changes to insurance: No  New side effects reported not previously addressed with a pharmacist or physician: None reported  Questions for the pharmacist: No    Confirmed patient received a Conservation officer, historic buildings and a Surveyor, mining with first shipment. The patient will receive a drug information handout for each medication shipped and additional FDA Medication Guides as required.       DISEASE/MEDICATION-SPECIFIC INFORMATION        N/A    SPECIALTY MEDICATION ADHERENCE     Medication Adherence    Patient reported X missed doses in the last month: 0  Specialty Medication: predniSONE 5 MG tablet (DELTASONE)  Patient is on additional specialty medications: Yes  Additional Specialty Medications: PROGRAF 1 mg capsule (tacrolimus)  Patient Reported Additional Medication X Missed Doses in the Last Month: 0  Patient is on more than two specialty medications: No  Adherence tools used: patient uses a pill box to manage medications  Support network for adherence: family member              Were doses missed due to medication being on hold? No    prednisone 5 mg: 10 days of medicine on hand   Prograf 1 mg: 10 days of medicine on hand     REFERRAL TO PHARMACIST     Referral to the pharmacist: Not needed      [facility]     Shipping address confirmed in Epic.       Delivery Scheduled: Yes, Expected medication delivery date: 12/08/22.     Medication will be delivered via UPS to the prescription address in Epic WAM.    Quintella Reichert   [facility] Specialty Technician

## 2022-12-04 NOTE — Progress Notes (Deleted)
Subjective:   Tyler Alvarez is a 47 y.o. male who presents for Medicare Annual/Subsequent preventive examination.  Visit Complete: {VISITMETHOD:[phone]}  Patient Medicare AWV questionnaire was completed by the patient on ***; I have confirmed that all information answered by patient is correct and no changes since this date.  Review of Systems    ***       Objective:    There were no vitals filed for this visit. There is no height or weight on file to calculate BMI.     07/29/2022   11:24 AM 06/20/2021    3:29 PM 05/24/2021   10:23 AM 05/12/2021    2:17 PM 02/25/2021    2:00 AM 02/24/2021    5:06 PM 06/16/2020   10:42 AM  Advanced Directives  Does Patient Have a Medical Advance Directive? No No No No No No No  Would patient like information on creating a medical advance directive?  No - Patient declined No - Patient declined Yes (MAU/Ambulatory/Procedural Areas - Information given) No - Patient declined No - Patient declined No - Patient declined    Current Medications (verified) Outpatient Encounter Medications as of 12/04/2022  Medication Sig   acetaminophen (TYLENOL) 500 MG tablet Take 1,000 mg by mouth daily as needed for mild pain, headache or fever.   aspirin EC 81 MG tablet Take 81 mg by mouth daily. Swallow whole.   brompheniramine-pseudoephedrine (DIMETAPP) 1-15 MG/5ML ELIX Take 20 mLs by mouth daily as needed for allergies (Cold).   Cholecalciferol (VITAMIN D3 PO) Take 1 tablet by mouth daily.   divalproex (DEPAKOTE) 250 MG DR tablet TAKE 1 TABLET BY MOUTH TWICE A DAY   doxycycline (VIBRAMYCIN) 100 MG capsule Take 1 capsule (100 mg total) by mouth 2 (two) times daily.   gabapentin (NEURONTIN) 300 MG capsule TAKE 1 CAPSULE BY MOUTH EVERYDAY AT BEDTIME   imipramine (TOFRANIL) 25 MG tablet Take 50 mg by mouth at bedtime.    losartan (COZAAR) 25 MG tablet Take 25 mg by mouth daily.   ondansetron (ZOFRAN-ODT) 4 MG disintegrating tablet Take 1 tablet (4 mg total) by  mouth every 8 (eight) hours as needed for nausea or vomiting.   predniSONE (DELTASONE) 5 MG tablet Take 5 mg by mouth daily.    tacrolimus (PROGRAF) 1 MG capsule Take 2 mg by mouth 2 (two) times daily.    No facility-administered encounter medications on file as of 12/04/2022.    Allergies (verified) Erythromycin and Sulfa antibiotics   History: Past Medical History:  Diagnosis Date   ADD (attention deficit disorder)    Anxiety    Ascending aortic aneurysm (HCC)    Benign skin lesion of neck    left neck cyst   Chronic kidney disease    s/p transplant 2001   Family history of adverse reaction to anesthesia    Pt mother stated Father-"I think that he stopped breathing, they called a code but he came through okay"   GERD (gastroesophageal reflux disease)    Headache    Migraines   Heart murmur    Hypertension    Immune deficiency disorder (HCC)    Restless leg syndrome    Seizures (HCC)    treated by Dr. Malvin Alvarez, no seizures in years and years   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HCC)    Wears glasses    Past Surgical History:  Procedure Laterality Date   AV FISTULA PLACEMENT     KIDNEY TRANSPLANT  2001   LESION EXCISION WITH COMPLEX REPAIR Left  05/24/2021   Procedure: EXCISION BENIGN LESION NECK 9CM, LAYERED CLOSURE NECK 10CM;  Surgeon: Glenna Fellows, MD;  Location: [facility] OR;  Service: Plastics;  Laterality: Left;   MASS EXCISION Left 12/11/2016   Procedure: EXCISION OF BENIGN LESION OF THE NECK WITH LAYERED CLOSURE;  Surgeon: Glenna Fellows, MD;  Location: [facility] OR;  Service: Plastics;  Laterality: Left;   MASS EXCISION     neck x2   MASS EXCISION Bilateral 04/01/2018   Procedure: excision benign lesions left neck and right and left cheek ( 4cm, 2cm, 1cm x3), layered closure neck <10cm, layered closure right cheek 1cm;  Surgeon: Glenna Fellows, MD;  Location: [facility] OR;  Service: Plastics;  Laterality: Bilateral;  left cheek lesion   MULTIPLE TOOTH EXTRACTIONS     NEPHRECTOMY     both  removed in 1999, 3 months apart   REVISON OF ARTERIOVENOUS FISTULA Left 07/16/2014   Procedure: EXCISION ANEURYSMAL AREA OF LEFT ARTERIOVENOUS FISTULA;  Surgeon: Nada Libman, MD;  Location: [facility] OR;  Service: Vascular;  Laterality: Left;   Family History  Problem Relation Age of Onset   Hypothyroidism Mother    Cancer Mother 12       cervical   Hypertension Mother    COPD Mother    COPD Father 86       COPD   Ulcers Father    COPD Brother    Heart disease Brother    Cancer Other    Social History   Socioeconomic History   Marital status: Single    Spouse name: Not on file   Number of children: Not on file   Years of education: Not on file   Highest education level: Not on file  Occupational History   Not on file  Tobacco Use   Smoking status: Never   Smokeless tobacco: Never  Vaping Use   Vaping status: Never Used  Substance and Sexual Activity   Alcohol use: No   Drug use: No   Sexual activity: Yes  Other Topics Concern   Not on file  Social History Narrative   Caffeine one daily.  Monster every other day.  Lives at home with mom.  Education: 8th grade.   Single.  None children.     Social Determinants of Health   Financial Resource Strain: Not on file  Food Insecurity: Not on file  Transportation Needs: Not on file  Physical Activity: Not on file  Stress: Not on file  Social Connections: Not on file    Tobacco Counseling Counseling given: Not Answered   Clinical Intake:                        Activities of Daily Living     No data to display          Patient Care Team: Levin Erp, MD as PCP - General (Family Medicine) Ethelene Hal, MD as Attending Physician (Neurology) Arita Miss, MD as Attending Physician (Nephrology) Christophe Louis, NP as Nurse Practitioner (Nephrology)  Indicate any recent Medical Services you may have received from other than [facility] providers in the past year (date may be approximate).      Assessment:   This is a routine wellness examination for Tyler Alvarez.  Hearing/Vision screen No results found.  Dietary issues and exercise activities discussed:     Goals Addressed   None    Depression Screen    06/20/2021    3:29 PM 06/16/2020  10:42 AM 01/16/2020   10:12 AM 09/15/2019    2:45 PM 03/17/2016    2:26 PM 11/15/2015    2:18 PM 08/24/2015    1:57 PM  PHQ 2/9 Scores  PHQ - 2 Score 0 0 0 0 0 0 1  PHQ- 9 Score 0 1 0        Fall Risk    08/24/2015    1:57 PM 08/26/2014    2:21 PM  Fall Risk   Falls in the past year? No No    MEDICARE RISK AT HOME:   TIMED UP AND GO:  Was the test performed?  No    Cognitive Function:        Immunizations Immunization History  Administered Date(s) Administered   Covid-19, Mrna,Vaccine(Spikevax)80yrs and older 04/12/2022   Influenza Split 02/09/2011   Influenza Whole 03/08/2007, 02/21/2008, 02/24/2009   Influenza,inj,Quad PF,6+ Mos 02/06/2013, 02/17/2014, 03/01/2015, 02/09/2016, 01/16/2020, 02/28/2021   PFIZER(Purple Top)SARS-COV-2 Vaccination 07/30/2019, 08/20/2019, 02/02/2020   Pfizer Covid-19 Vaccine Bivalent Booster 35yrs & up 03/25/2021   Pneumococcal Polysaccharide-23 02/28/2021   Tdap 11/08/2010    TDAP status: Due, Education has been provided regarding the importance of this vaccine. Advised may receive this vaccine at local pharmacy or Health Dept. Aware to provide a copy of the vaccination record if obtained from local pharmacy or Health Dept. Verbalized acceptance and understanding.  Pneumococcal vaccine status: Up to date  Covid-19 vaccine status: Information provided on how to obtain vaccines.   Qualifies for Shingles Vaccine? No    Screening Tests Health Maintenance  Topic Date Due   Hepatitis C Screening  Never done   Medicare Annual Wellness (AWV)  08/23/2016   Colonoscopy  Never done   DTaP/Tdap/Td (2 - Td or Tdap) 11/07/2020   COVID-19 Vaccine (6 - 2023-24 season) 06/07/2022   INFLUENZA VACCINE   12/14/2022   HIV Screening  Completed   HPV VACCINES  Aged Out    Health Maintenance  Health Maintenance Due  Topic Date Due   Hepatitis C Screening  Never done   Medicare Annual Wellness (AWV)  08/23/2016   Colonoscopy  Never done   DTaP/Tdap/Td (2 - Td or Tdap) 11/07/2020   COVID-19 Vaccine (6 - 2023-24 season) 06/07/2022    {Colorectal cancer screening:[id number]}  Lung Cancer Screening: (Low Dose CT Chest recommended if Age 5-80 years, 20 pack-year currently smoking OR have quit w/in 15years.) does not qualify.   Lung Cancer Screening Referral: n/a  Additional Screening:  Hepatitis C Screening: does qualify;  Vision Screening: Recommended annual ophthalmology exams for early detection of glaucoma and other disorders of the eye. Is the patient up to date with their annual eye exam?  {YES/NO:21197} Who is the provider or what is the name of the office in which the patient attends annual eye exams? *** If pt is not established with a provider, would they like to be referred to a provider to establish care? {YES/NO:21197}.   Dental Screening: Recommended annual dental exams for proper oral hygiene  Community Resource Referral / Chronic Care Management: CRR required this visit?  {YES/NO:21197}  CCM required this visit?  {CCM Required choices:[phone]}     Plan:     I have personally reviewed and noted the following in the patient's chart:   Medical and social history Use of alcohol, tobacco or illicit drugs  Current medications and supplements including opioid prescriptions. {Opioid Prescriptions:[phone]} Functional ability and status Nutritional status Physical activity Advanced directives List of other physicians Hospitalizations, surgeries, and  ER visits in previous 12 months Vitals Screenings to include cognitive, depression, and falls Referrals and appointments  In addition, I have reviewed and discussed with patient certain preventive protocols,  quality metrics, and best practice recommendations. A written personalized care plan for preventive services as well as general preventive health recommendations were provided to patient.     Kandis Fantasia [place], [place]   4/69/6295   After Visit Summary: {CHL AMB AWV After Visit Summary:[id number]}  Nurse Notes: ***

## 2022-12-07 MED FILL — PREDNISONE 5 MG TABLET: ORAL | 90 days supply | Qty: 90 | Fill #2

## 2022-12-07 MED FILL — PROGRAF 1 MG CAPSULE: ORAL | 90 days supply | Qty: 360 | Fill #2

## 2022-12-15 ENCOUNTER — Ambulatory Visit (INDEPENDENT_AMBULATORY_CARE_PROVIDER_SITE_OTHER): Payer: 59

## 2022-12-15 VITALS — Ht 72.0 in | Wt 128.0 lb

## 2022-12-15 DIAGNOSIS — Z0000 Encounter for general adult medical examination without abnormal findings: Secondary | ICD-10-CM

## 2022-12-15 NOTE — Patient Instructions (Signed)
Tyler Alvarez , Thank you for taking time to come for your Medicare Wellness Visit. I appreciate your ongoing commitment to your health goals. Please review the following plan we discussed and let me know if I can assist you in the future.   Referrals/Orders/Follow-Ups/Clinician Recommendations: Aim for 30 minutes of exercise or brisk walking, 6-8 glasses of water, and 5 servings of fruits and vegetables each day.  This is a list of the screening recommended for you and due dates:  Health Maintenance  Topic Date Due   Hepatitis C Screening  Never done   Colon Cancer Screening  Never done   DTaP/Tdap/Td vaccine (2 - Td or Tdap) 11/07/2020   COVID-19 Vaccine (6 - 2023-24 season) 06/07/2022   Flu Shot  12/14/2022   Medicare Annual Wellness Visit  12/15/2023   HIV Screening  Completed   HPV Vaccine  Aged Out    Advanced directives: (ACP Link)Information on Advanced Care Planning can be found at [facility] of [place] Advance Health Care Directives Advance Health Care Directives ([email or web address]   Next Medicare Annual Wellness Visit scheduled for next year: Yes  Preventive Care 40-64 Years, Male Preventive care refers to lifestyle choices and visits with your health care provider that can promote health and wellness. What does preventive care include? A yearly physical exam. This is also called an annual well check. Dental exams once or twice a year. Routine eye exams. Ask your health care provider how often you should have your eyes checked. Personal lifestyle choices, including: Daily care of your teeth and gums. Regular physical activity. Eating a healthy diet. Avoiding tobacco and drug use. Limiting alcohol use. Practicing safe sex. Taking low-dose aspirin every day starting at age 85. What happens during an annual well check? The services and screenings done by your health care provider during your annual well check will depend on your age, overall health, lifestyle risk  factors, and family history of disease. Counseling  Your health care provider may ask you questions about your: Alcohol use. Tobacco use. Drug use. Emotional well-being. Home and relationship well-being. Sexual activity. Eating habits. Work and work Astronomer. Screening  You may have the following tests or measurements: Height, weight, and BMI. Blood pressure. Lipid and cholesterol levels. These may be checked every 5 years, or more frequently if you are over 57 years old. Skin check. Lung cancer screening. You may have this screening every year starting at age 38 if you have a 30-pack-year history of smoking and currently smoke or have quit within the past 15 years. Fecal occult blood test (FOBT) of the stool. You may have this test every year starting at age 48. Flexible sigmoidoscopy or colonoscopy. You may have a sigmoidoscopy every 5 years or a colonoscopy every 10 years starting at age 49. Prostate cancer screening. Recommendations will vary depending on your family history and other risks. Hepatitis C blood test. Hepatitis B blood test. Sexually transmitted disease (STD) testing. Diabetes screening. This is done by checking your blood sugar (glucose) after you have not eaten for a while (fasting). You may have this done every 1-3 years. Discuss your test results, treatment options, and if necessary, the need for more tests with your health care provider. Vaccines  Your health care provider may recommend certain vaccines, such as: Influenza vaccine. This is recommended every year. Tetanus, diphtheria, and acellular pertussis (Tdap, Td) vaccine. You may need a Td booster every 10 years. Zoster vaccine. You may need this after age 21. Pneumococcal 13-valent  conjugate (PCV13) vaccine. You may need this if you have certain conditions and have not been vaccinated. Pneumococcal polysaccharide (PPSV23) vaccine. You may need one or two doses if you smoke cigarettes or if you have  certain conditions. Talk to your health care provider about which screenings and vaccines you need and how often you need them. This information is not intended to replace advice given to you by your health care provider. Make sure you discuss any questions you have with your health care provider. Document Released: 05/28/2015 Document Revised: 01/19/2016 Document Reviewed: 03/02/2015 Elsevier Interactive Patient Education  2017 [employer].  Fall Prevention in the Home Falls can cause injuries. They can happen to people of all ages. There are many things you can do to make your home safe and to help prevent falls. What can I do on the outside of my home? Regularly fix the edges of walkways and driveways and fix any cracks. Remove anything that might make you trip as you walk through a door, such as a raised step or threshold. Trim any bushes or trees on the path to your home. Use bright outdoor lighting. Clear any walking paths of anything that might make someone trip, such as rocks or tools. Regularly check to see if handrails are loose or broken. Make sure that both sides of any steps have handrails. Any raised decks and porches should have guardrails on the edges. Have any leaves, snow, or ice cleared regularly. Use sand or salt on walking paths during winter. Clean up any spills in your garage right away. This includes oil or grease spills. What can I do in the bathroom? Use night lights. Install grab bars by the toilet and in the tub and shower. Do not use towel bars as grab bars. Use non-skid mats or decals in the tub or shower. If you need to sit down in the shower, use a plastic, non-slip stool. Keep the floor dry. Clean up any water that spills on the floor as soon as it happens. Remove soap buildup in the tub or shower regularly. Attach bath mats securely with double-sided non-slip rug tape. Do not have throw rugs and other things on the floor that can make you trip. What can  I do in the bedroom? Use night lights. Make sure that you have a light by your bed that is easy to reach. Do not use any sheets or blankets that are too big for your bed. They should not hang down onto the floor. Have a firm chair that has side arms. You can use this for support while you get dressed. Do not have throw rugs and other things on the floor that can make you trip. What can I do in the kitchen? Clean up any spills right away. Avoid walking on wet floors. Keep items that you use a lot in easy-to-reach places. If you need to reach something above you, use a strong step stool that has a grab bar. Keep electrical cords out of the way. Do not use floor polish or wax that makes floors slippery. If you must use wax, use non-skid floor wax. Do not have throw rugs and other things on the floor that can make you trip. What can I do with my stairs? Do not leave any items on the stairs. Make sure that there are handrails on both sides of the stairs and use them. Fix handrails that are broken or loose. Make sure that handrails are as long as the stairways. Check  any carpeting to make sure that it is firmly attached to the stairs. Fix any carpet that is loose or worn. Avoid having throw rugs at the top or bottom of the stairs. If you do have throw rugs, attach them to the floor with carpet tape. Make sure that you have a light switch at the top of the stairs and the bottom of the stairs. If you do not have them, ask someone to add them for you. What else can I do to help prevent falls? Wear shoes that: Do not have high heels. Have rubber bottoms. Are comfortable and fit you well. Are closed at the toe. Do not wear sandals. If you use a stepladder: Make sure that it is fully opened. Do not climb a closed stepladder. Make sure that both sides of the stepladder are locked into place. Ask someone to hold it for you, if possible. Clearly mark and make sure that you can see: Any grab bars or  handrails. First and last steps. Where the edge of each step is. Use tools that help you move around (mobility aids) if they are needed. These include: Canes. Walkers. Scooters. Crutches. Turn on the lights when you go into a dark area. Replace any light bulbs as soon as they burn out. Set up your furniture so you have a clear path. Avoid moving your furniture around. If any of your floors are uneven, fix them. If there are any pets around you, be aware of where they are. Review your medicines with your doctor. Some medicines can make you feel dizzy. This can increase your chance of falling. Ask your doctor what other things that you can do to help prevent falls. This information is not intended to replace advice given to you by your health care provider. Make sure you discuss any questions you have with your health care provider. Document Released: 02/25/2009 Document Revised: 10/07/2015 Document Reviewed: 06/05/2014 Elsevier Interactive Patient Education  2017 [employer].

## 2022-12-15 NOTE — Progress Notes (Signed)
Subjective:   Tyler Alvarez is a 47 y.o. male who presents for Medicare Annual/Subsequent preventive examination.  Visit Complete: Virtual  I connected with  Tyler Alvarez on 12/15/22 by a audio enabled telemedicine application and verified that I am speaking with the correct person using two identifiers.  Patient Location: Home  Provider Location: Home Office  I discussed the limitations of evaluation and management by telemedicine. The patient expressed understanding and agreed to proceed.  Vital Signs: Per patient no change in vitals since last visit.  Review of Systems     Cardiac Risk Factors include: male gender;hypertension     Objective:    Today's Vitals   12/15/22 2123  Weight: 128 lb (58.1 kg)  Height: 6' (1.829 m)   Body mass index is 17.36 kg/m.     12/15/2022    9:30 PM 07/29/2022   11:24 AM 06/20/2021    3:29 PM 05/24/2021   10:23 AM 05/12/2021    2:17 PM 02/25/2021    2:00 AM 02/24/2021    5:06 PM  Advanced Directives  Does Patient Have a Medical Advance Directive? No No No No No No No  Would patient like information on creating a medical advance directive? Yes (MAU/Ambulatory/Procedural Areas - Information given)  No - Patient declined No - Patient declined Yes (MAU/Ambulatory/Procedural Areas - Information given) No - Patient declined No - Patient declined    Current Medications (verified) Outpatient Encounter Medications as of 12/15/2022  Medication Sig   acetaminophen (TYLENOL) 500 MG tablet Take 1,000 mg by mouth daily as needed for mild pain, headache or fever.   aspirin EC 81 MG tablet Take 81 mg by mouth daily. Swallow whole.   brompheniramine-pseudoephedrine (DIMETAPP) 1-15 MG/5ML ELIX Take 20 mLs by mouth daily as needed for allergies (Cold).   Cholecalciferol (VITAMIN D3 PO) Take 1 tablet by mouth daily.   divalproex (DEPAKOTE) 250 MG DR tablet TAKE 1 TABLET BY MOUTH TWICE A DAY   doxycycline (VIBRAMYCIN) 100 MG capsule Take 1 capsule  (100 mg total) by mouth 2 (two) times daily.   gabapentin (NEURONTIN) 300 MG capsule TAKE 1 CAPSULE BY MOUTH EVERYDAY AT BEDTIME   imipramine (TOFRANIL) 25 MG tablet Take 50 mg by mouth at bedtime.    losartan (COZAAR) 25 MG tablet Take 25 mg by mouth daily.   ondansetron (ZOFRAN-ODT) 4 MG disintegrating tablet Take 1 tablet (4 mg total) by mouth every 8 (eight) hours as needed for nausea or vomiting.   predniSONE (DELTASONE) 5 MG tablet Take 5 mg by mouth daily.    tacrolimus (PROGRAF) 1 MG capsule Take 2 mg by mouth 2 (two) times daily.    No facility-administered encounter medications on file as of 12/15/2022.    Allergies (verified) Erythromycin and Sulfa antibiotics   History: Past Medical History:  Diagnosis Date   ADD (attention deficit disorder)    Anxiety    Ascending aortic aneurysm (HCC)    Benign skin lesion of neck    left neck cyst   Chronic kidney disease    s/p transplant 2001   Family history of adverse reaction to anesthesia    Pt mother stated Father-"I think that he stopped breathing, they called a code but he came through okay"   GERD (gastroesophageal reflux disease)    Headache    Migraines   Heart murmur    Hypertension    Immune deficiency disorder (HCC)    Restless leg syndrome    Seizures (HCC)  treated by Dr. Malvin Johns, no seizures in years and years   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HCC)    Wears glasses    Past Surgical History:  Procedure Laterality Date   AV FISTULA PLACEMENT     KIDNEY TRANSPLANT  2001   LESION EXCISION WITH COMPLEX REPAIR Left 05/24/2021   Procedure: EXCISION BENIGN LESION NECK 9CM, LAYERED CLOSURE NECK 10CM;  Surgeon: Glenna Fellows, MD;  Location: [facility] OR;  Service: Plastics;  Laterality: Left;   MASS EXCISION Left 12/11/2016   Procedure: EXCISION OF BENIGN LESION OF THE NECK WITH LAYERED CLOSURE;  Surgeon: Glenna Fellows, MD;  Location: [facility] OR;  Service: Plastics;  Laterality: Left;   MASS EXCISION     neck x2   MASS EXCISION  Bilateral 04/01/2018   Procedure: excision benign lesions left neck and right and left cheek ( 4cm, 2cm, 1cm x3), layered closure neck <10cm, layered closure right cheek 1cm;  Surgeon: Glenna Fellows, MD;  Location: [facility] OR;  Service: Plastics;  Laterality: Bilateral;  left cheek lesion   MULTIPLE TOOTH EXTRACTIONS     NEPHRECTOMY     both removed in 1999, 3 months apart   REVISON OF ARTERIOVENOUS FISTULA Left 07/16/2014   Procedure: EXCISION ANEURYSMAL AREA OF LEFT ARTERIOVENOUS FISTULA;  Surgeon: Nada Libman, MD;  Location: [facility] OR;  Service: Vascular;  Laterality: Left;   Family History  Problem Relation Age of Onset   Hypothyroidism Mother    Cancer Mother 57       cervical   Hypertension Mother    COPD Mother    COPD Father 34       COPD   Ulcers Father    COPD Brother    Heart disease Brother    Cancer Other    Social History   Socioeconomic History   Marital status: Single    Spouse name: Not on file   Number of children: Not on file   Years of education: Not on file   Highest education level: Not on file  Occupational History   Not on file  Tobacco Use   Smoking status: Never   Smokeless tobacco: Never  Vaping Use   Vaping status: Never Used  Substance and Sexual Activity   Alcohol use: No   Drug use: No   Sexual activity: Yes  Other Topics Concern   Not on file  Social History Narrative   Caffeine one daily.  Monster every other day.  Lives at home with mom.  Education: 8th grade.   Single.  None children.     Social Determinants of Health   Financial Resource Strain: Low Risk  (12/15/2022)   Overall Financial Resource Strain (CARDIA)    Difficulty of Paying Living Expenses: Not hard at all  Food Insecurity: No Food Insecurity (12/15/2022)   Hunger Vital Sign    Worried About Running Out of Food in the Last Year: Never true    Ran Out of Food in the Last Year: Never true  Transportation Needs: No Transportation Needs (12/15/2022)   PRAPARE - Therapist, art (Medical): No    Lack of Transportation (Non-Medical): No  Physical Activity: Insufficiently Active (12/15/2022)   Exercise Vital Sign    Days of Exercise per Week: 3 days    Minutes of Exercise per Session: 30 min  Stress: No Stress Concern Present (12/15/2022)   [employer] of Occupational Health - Occupational Stress Questionnaire    Feeling of Stress : Not at all  Social Connections: Socially Isolated (12/15/2022)   Social Connection and Isolation Panel [NHANES]    Frequency of Communication with Friends and Family: More than three times a week    Frequency of Social Gatherings with Friends and Family: Three times a week    Attends Religious Services: Never    Active Member of Clubs or Organizations: No    Attends Engineer, structural: Never    Marital Status: Never married    Tobacco Counseling Counseling given: Not Answered   Clinical Intake:  Pre-visit preparation completed: Yes  Pain : No/denies pain     Diabetes: No  How often do you need to have someone help you when you read instructions, pamphlets, or other written materials from your doctor or pharmacy?: 1 - Never  Interpreter Needed?: No  Information entered by :: Kandis Fantasia LPN   Activities of Daily Living    12/15/2022    9:25 PM  In your present state of health, do you have any difficulty performing the following activities:  Hearing? 0  Vision? 0  Difficulty concentrating or making decisions? 0  Walking or climbing stairs? 0  Dressing or bathing? 0  Doing errands, shopping? 0  Preparing Food and eating ? N  Using the Toilet? N  In the past six months, have you accidently leaked urine? N  Do you have problems with loss of bowel control? N  Managing your Medications? N  Managing your Finances? N  Housekeeping or managing your Housekeeping? N    Patient Care Team: Levin Erp, MD as PCP - General (Family Medicine) Ethelene Hal, MD as Attending  Physician (Neurology) Arita Miss, MD as Attending Physician (Nephrology) Christophe Louis, NP as Nurse Practitioner (Nephrology)  Indicate any recent Medical Services you may have received from other than [facility] providers in the past year (date may be approximate).     Assessment:   This is a routine wellness examination for [place].  Hearing/Vision screen Hearing Screening - Comments:: Denies hearing difficulties   Vision Screening - Comments:: Wears rx glasses - up to date with routine eye exams with Dr. Emily Filbert     Dietary issues and exercise activities discussed:     Goals Addressed   None   Depression Screen    12/15/2022    9:28 PM 06/20/2021    3:29 PM 06/16/2020   10:42 AM 01/16/2020   10:12 AM 09/15/2019    2:45 PM 03/17/2016    2:26 PM 11/15/2015    2:18 PM  PHQ 2/9 Scores  PHQ - 2 Score 0 0 0 0 0 0 0  PHQ- 9 Score  0 1 0       Fall Risk    12/15/2022    9:30 PM 08/24/2015    1:57 PM 08/26/2014    2:21 PM  Fall Risk   Falls in the past year? 0 No No  Number falls in past yr: 0    Injury with Fall? 0    Risk for fall due to : No Fall Risks    Follow up Falls prevention discussed;Education provided;Falls evaluation completed      MEDICARE RISK AT HOME:  Medicare Risk at Home - 12/15/22 2130     Any stairs in or around the home? No    If so, are there any without handrails? No    Home free of loose throw rugs in walkways, pet beds, electrical cords, etc? Yes    Adequate lighting in your home  to reduce risk of falls? Yes    Life alert? No    Use of a cane, walker or w/c? No    Grab bars in the bathroom? Yes    Shower chair or bench in shower? No    Elevated toilet seat or a handicapped toilet? No             TIMED UP AND GO:  Was the test performed?  No    Cognitive Function:        12/15/2022    9:31 PM  6CIT Screen  What Year? 0 points  What month? 0 points  What time? 0 points  Count back from 20 0 points  Months in reverse 0 points   Repeat phrase 0 points  Total Score 0 points    Immunizations Immunization History  Administered Date(s) Administered   Covid-19, Mrna,Vaccine(Spikevax)65yrs and older 04/12/2022   Influenza Split 02/09/2011   Influenza Whole 03/08/2007, 02/21/2008, 02/24/2009   Influenza,inj,Quad PF,6+ Mos 02/06/2013, 02/17/2014, 03/01/2015, 02/09/2016, 01/16/2020, 02/28/2021   PFIZER(Purple Top)SARS-COV-2 Vaccination 07/30/2019, 08/20/2019, 02/02/2020   Pfizer Covid-19 Vaccine Bivalent Booster 28yrs & up 03/25/2021   Pneumococcal Polysaccharide-23 02/28/2021   Tdap 11/08/2010    TDAP status: Due, Education has been provided regarding the importance of this vaccine. Advised may receive this vaccine at local pharmacy or Health Dept. Aware to provide a copy of the vaccination record if obtained from local pharmacy or Health Dept. Verbalized acceptance and understanding.  Flu Vaccine status: Due, Education has been provided regarding the importance of this vaccine. Advised may receive this vaccine at local pharmacy or Health Dept. Aware to provide a copy of the vaccination record if obtained from local pharmacy or Health Dept. Verbalized acceptance and understanding.  Pneumococcal vaccine status: Up to date  Covid-19 vaccine status: Information provided on how to obtain vaccines.   Qualifies for Shingles Vaccine? No    Screening Tests Health Maintenance  Topic Date Due   Hepatitis C Screening  Never done   Colonoscopy  Never done   DTaP/Tdap/Td (2 - Td or Tdap) 11/07/2020   COVID-19 Vaccine (6 - 2023-24 season) 06/07/2022   INFLUENZA VACCINE  12/14/2022   Medicare Annual Wellness (AWV)  12/15/2023   HIV Screening  Completed   HPV VACCINES  Aged Out    Health Maintenance  Health Maintenance Due  Topic Date Due   Hepatitis C Screening  Never done   Colonoscopy  Never done   DTaP/Tdap/Td (2 - Td or Tdap) 11/07/2020   COVID-19 Vaccine (6 - 2023-24 season) 06/07/2022   INFLUENZA VACCINE   12/14/2022    Colorectal cancer screening:  Patient declines at this time  Lung Cancer Screening: (Low Dose CT Chest recommended if Age 19-80 years, 20 pack-year currently smoking OR have quit w/in 15years.) does not qualify.   Lung Cancer Screening Referral: n/a  Additional Screening:  Hepatitis C Screening: does qualify  Vision Screening: Recommended annual ophthalmology exams for early detection of glaucoma and other disorders of the eye. Is the patient up to date with their annual eye exam?  Yes  Who is the provider or what is the name of the office in which the patient attends annual eye exams? [facility] If pt is not established with a provider, would they like to be referred to a provider to establish care? No .   Dental Screening: Recommended annual dental exams for proper oral hygiene  Community Resource Referral / Chronic Care Management: CRR required this visit?  No  CCM required this visit?  No     Plan:     I have personally reviewed and noted the following in the patient's chart:   Medical and social history Use of alcohol, tobacco or illicit drugs  Current medications and supplements including opioid prescriptions. Patient is not currently taking opioid prescriptions. Functional ability and status Nutritional status Physical activity Advanced directives List of other physicians Hospitalizations, surgeries, and ER visits in previous 12 months Vitals Screenings to include cognitive, depression, and falls Referrals and appointments  In addition, I have reviewed and discussed with patient certain preventive protocols, quality metrics, and best practice recommendations. A written personalized care plan for preventive services as well as general preventive health recommendations were provided to patient.     Kandis Fantasia [place], [place]   05/20/1094   After Visit Summary: (Mail) Due to this being a telephonic visit, the after visit summary with patients  personalized plan was offered to patient via mail   Nurse Notes: No concerns at this time

## 2022-12-22 ENCOUNTER — Other Ambulatory Visit: Payer: Self-pay

## 2022-12-22 DIAGNOSIS — H3500 Unspecified background retinopathy: Secondary | ICD-10-CM | POA: Diagnosis not present

## 2022-12-22 DIAGNOSIS — H5213 Myopia, bilateral: Secondary | ICD-10-CM | POA: Diagnosis not present

## 2022-12-22 DIAGNOSIS — H35032 Hypertensive retinopathy, left eye: Secondary | ICD-10-CM | POA: Diagnosis not present

## 2022-12-22 MED ORDER — GABAPENTIN 300 MG PO CAPS: 300.0000 mg | ORAL_CAPSULE | Freq: Every day | ORAL | 0 refills | Status: DC

## 2022-12-26 DIAGNOSIS — Z940 Kidney transplant status: Secondary | ICD-10-CM | POA: Diagnosis not present

## 2022-12-26 DIAGNOSIS — I5189 Other ill-defined heart diseases: Secondary | ICD-10-CM | POA: Diagnosis not present

## 2022-12-26 DIAGNOSIS — I517 Cardiomegaly: Secondary | ICD-10-CM | POA: Diagnosis not present

## 2022-12-26 DIAGNOSIS — I7121 Aneurysm of the ascending aorta, without rupture: Secondary | ICD-10-CM | POA: Diagnosis not present

## 2022-12-26 DIAGNOSIS — D849 Immunodeficiency, unspecified: Secondary | ICD-10-CM | POA: Diagnosis not present

## 2022-12-26 DIAGNOSIS — N2581 Secondary hyperparathyroidism of renal origin: Secondary | ICD-10-CM | POA: Diagnosis not present

## 2022-12-26 DIAGNOSIS — E559 Vitamin D deficiency, unspecified: Secondary | ICD-10-CM | POA: Diagnosis not present

## 2022-12-26 DIAGNOSIS — I10 Essential (primary) hypertension: Secondary | ICD-10-CM | POA: Diagnosis not present

## 2023-02-26 NOTE — Unmapped (Signed)
[facility] Delivery Pharm[facility] Refill Coordination Note    Specialty Medication(s) to be Shipped:   Transplant: Prograf 1mg     Other medication(s) to be shipped:  Prednisone 5 mg     Oscar [name], DOB: 1975-06-28  Phone: [phone] (home)       All above HIPAA information was verified with patient's family member, Mother.     Was a Nurse, learning disability used for this call? No    Completed refill call assessment today to schedule patient's medication shipment from the [facility] Delivery Pharmacy  [phone]).  All relevant notes have been reviewed.     Specialty medication(s) and dose(s) confirmed: Regimen is correct and unchanged.   Changes to medications: Weyman reports no changes at this time.  Changes to insurance: No  New side effects reported not previously addressed with a pharmacist or physician: None reported  Questions for the pharmacist: No    Confirmed patient received a Conservation officer, historic buildings and a Surveyor, mining with first shipment. The patient will receive a drug information handout for each medication shipped and additional FDA Medication Guides as required.       DISEASE/MEDICATION-SPECIFIC INFORMATION        N/A    SPECIALTY MEDICATION ADHERENCE     Medication Adherence    Patient reported X missed doses in the last month: 0  Specialty Medication: PROGRAF 1 mg capsule (tacrolimus)  Patient is on additional specialty medications: No  Adherence tools used: patient uses a pill box to manage medications  Support network for adherence: family member              Were doses missed due to medication being on hold? No    Prograf 1 mg: 4 days of medicine on hand        REFERRAL TO PHARMACIST     Referral to the pharmacist: Not needed      [facility]     Shipping address confirmed in Epic.       Delivery Scheduled: Yes, Expected medication delivery date: 03/01/23.     Medication will be delivered via UPS to the prescription address in Epic WAM.    Willette Pa   [facility]  Specialty Technician

## 2023-02-27 NOTE — Unmapped (Signed)
UNOS forms

## 2023-02-28 ENCOUNTER — Other Ambulatory Visit: Payer: Self-pay | Admitting: Student

## 2023-02-28 MED FILL — PREDNISONE 5 MG TABLET: ORAL | 90 days supply | Qty: 90 | Fill #3

## 2023-02-28 MED FILL — PROGRAF 1 MG CAPSULE: ORAL | 90 days supply | Qty: 360 | Fill #3

## 2023-03-19 DIAGNOSIS — Z940 Kidney transplant status: Principal | ICD-10-CM

## 2023-04-18 NOTE — Unmapped (Signed)
[facility] Delivery Pharm[facility] Clinical Assessment & Refill Coordination Note    Oscar [name], DOB: [birth year]  Phone: [phone] (home)     All above HIPAA information was verified with patient.     Was a Nurse, learning disability used for this call? No    Specialty Medication(s):   Transplant: Prograf 1mg  and Prednisone 5mg      Current Outpatient Medications   Medication Sig Dispense Refill    allopurinoL (ZYLOPRIM) 100 MG tablet Take 0.5 tablets (50 mg total) by mouth daily. 15 tablet 11    amlodipine (NORVASC) 5 MG tablet TAKE 1 TABLET BY MOUTH EVERY DAY IN THE EVENING 90 tablet 3    brompheniramine-phenylephrine (DIMETAPP COLD-ALLERGY, PE,) 1-2.5 mg/5 mL syrup Take 5 mL by mouth every six (6) hours as needed for cough. 118 mL 0    colchicine (COLCRYS) 0.6 mg tablet Take 1 tablet (0.6 mg total) by mouth daily. 30 tablet 11    divalproex (DEPAKOTE) 250 MG DR tablet TAKE 1 TABLET BY MOUTH 2 TIMES DAILY. ONLY TAKES 1 PER DAY (Patient taking differently: Take 2 tablets (500 mg total) by mouth daily.) 180 tablet 3    gabapentin (NEURONTIN) 300 MG capsule Take 1 capsule (300 mg total) by mouth nightly. 90 capsule 3    imipramine (TOFRANIL) 25 MG tablet Take 2 tablets (50 mg total) by mouth nightly. 180 tablet 3    losartan (COZAAR) 25 MG tablet TAKE 1 TABLET BY MOUTH TWO TIMES A DAY. 180 tablet 3    predniSONE (DELTASONE) 5 MG tablet Take 1 tablet (5 mg total) by mouth daily. 90 tablet 3    PROGRAF 1 mg capsule Take 2 capsules (2 mg total) by mouth two (2) times a day. 360 capsule 3     No current facility-administered medications for this visit.        Changes to medications: [name] reports no changes at this time.    Allergies   Allergen Reactions    Erythromycin     Erythromycin Base      Other reaction(s): NAUSEA    Sulfa (Sulfonamide Antibiotics)      Other reaction(s): ITCHING    Sulfacetamide Sodium        Changes to allergies: No    SPECIALTY MEDICATION ADHERENCE     Prednisone 5 mg: 40 days of medicine on hand   Prograf 1 mg: 40 days of medicine on hand       Medication Adherence    Patient reported X missed doses in the last month: 0  Specialty Medication: Prednisone 5mg   Patient is on additional specialty medications: Yes  Additional Specialty Medications: Prograf 1mg   Patient Reported Additional Medication X Missed Doses in the Last Month: 0  Patient is on more than two specialty medications: No  Adherence tools used: patient uses a pill box to manage medications  Support network for adherence: family member          Specialty medication(s) dose(s) confirmed: Regimen is correct and unchanged.     Are there any concerns with adherence? No    Adherence counseling provided? Not needed    CLINICAL MANAGEMENT AND INTERVENTION      Clinical Benefit Assessment:    Do you feel the medicine is effective or helping your condition? Yes    Clinical Benefit counseling provided? Not needed    Adverse Effects Assessment:    Are you experiencing any side effects? No    Are you experiencing difficulty administering your medicine?  No    Quality of Life Assessment:    Quality of Life    Rheumatology  Oncology  Dermatology  Cystic Fibrosis          How many days over the past month did your kidney transplant  keep you from your normal activities? For example, brushing your teeth or getting up in the morning. 0    Have you discussed this with your provider? Not needed    Acute Infection Status:    Acute infections noted within Epic:  No active infections  Patient reported infection: None    Therapy Appropriateness:    Is therapy appropriate based on current medication list, adverse reactions, adherence, clinical benefit and progress toward achieving therapeutic goals? Yes, therapy is appropriate and should be continued     DISEASE/MEDICATION-SPECIFIC INFORMATION      N/A    Solid Organ Transplant: Not Applicable    PATIENT SPECIFIC NEEDS     Does the patient have any physical, cognitive, or cultural barriers? No    Is the patient high risk? Yes, patient is taking a REMS drug. Medication is dispensed in compliance with REMS program    Did the patient require a clinical intervention? No    Does the patient require physician intervention or other additional services (i.e., nutrition, smoking cessation, social work)? No    SOCIAL DETERMINANTS OF HEALTH     At the [facility], we have learned that life circumstances - like trouble affording food, housing, utilities, or transportation can affect the health of many of our patients.   That is why we wanted to ask: are you currently experiencing any life circumstances that are negatively impacting your health and/or quality of life? Patient declined to answer    Social Drivers of Health     Food Insecurity: Not on file   Internet Connectivity: Not on file   Housing/Utilities: Not on file   Tobacco Use: Low Risk  (11/09/2022)    Patient History     Smoking Tobacco Use: Never     Smokeless Tobacco Use: Never     Passive Exposure: Not on file   Transportation Needs: Not on file   Alcohol Use: Not on file   Interpersonal Safety: Not on file   Physical Activity: Not on file   Intimate Partner Violence: Not on file   Stress: Not on file   Substance Use: Not on file (03/19/2023)   Social Connections: Not on file   Financial Resource Strain: Not on file   Depression: Not at risk (07/07/2021)    Received from [facility]    PHQ-2     Patient Health Questionnaire-2 Score: 0   Health Literacy: Not on file       Would you be willing to receive help with any of the needs that you have identified today? Not applicable       SHIPPING     Specialty Medication(s) to be Shipped:   Transplant: None- pt declined needing prednisone or prograf today.    Other medication(s) to be shipped: No additional medications requested for fill at this time     Changes to insurance: No    Delivery Scheduled: Patient declined refill at this time due to has more than a month of medicine on hand..     Medication will be delivered via UPS to the confirmed prescription address in [facility].    The patient will receive a drug information handout for each medication shipped and additional FDA  Medication Guides as required.  Verified that patient has previously received a Conservation officer, historic buildings and a Surveyor, mining.    The patient or caregiver noted above participated in the development of this care plan and knows that they can request review of or adjustments to the care plan at any time.      All of the patient's questions and concerns have been addressed.    Tera Helper, [facility]   [facility] Specialty Pharmacist

## 2023-04-25 ENCOUNTER — Encounter (HOSPITAL_BASED_OUTPATIENT_CLINIC_OR_DEPARTMENT_OTHER): Payer: Self-pay | Admitting: Emergency Medicine

## 2023-04-25 ENCOUNTER — Emergency Department (HOSPITAL_BASED_OUTPATIENT_CLINIC_OR_DEPARTMENT_OTHER)
Admission: EM | Admit: 2023-04-25 | Discharge: 2023-04-25 | Disposition: A | Discharge status: Home / Self Care | Payer: 59 | Attending: Emergency Medicine | Admitting: Emergency Medicine

## 2023-04-25 ENCOUNTER — Other Ambulatory Visit: Payer: Self-pay

## 2023-04-25 ENCOUNTER — Emergency Department (HOSPITAL_BASED_OUTPATIENT_CLINIC_OR_DEPARTMENT_OTHER): Payer: 59 | Admitting: Radiology

## 2023-04-25 DIAGNOSIS — R509 Fever, unspecified: Secondary | ICD-10-CM | POA: Diagnosis not present

## 2023-04-25 DIAGNOSIS — Z7982 Long term (current) use of aspirin: Secondary | ICD-10-CM | POA: Diagnosis not present

## 2023-04-25 DIAGNOSIS — R051 Acute cough: Secondary | ICD-10-CM | POA: Insufficient documentation

## 2023-04-25 DIAGNOSIS — R059 Cough, unspecified: Secondary | ICD-10-CM | POA: Diagnosis not present

## 2023-04-25 DIAGNOSIS — L0231 Cutaneous abscess of buttock: Secondary | ICD-10-CM | POA: Insufficient documentation

## 2023-04-25 DIAGNOSIS — Z20822 Contact with and (suspected) exposure to covid-19: Secondary | ICD-10-CM | POA: Insufficient documentation

## 2023-04-25 DIAGNOSIS — Z940 Kidney transplant status: Secondary | ICD-10-CM | POA: Diagnosis not present

## 2023-04-25 DIAGNOSIS — R112 Nausea with vomiting, unspecified: Secondary | ICD-10-CM | POA: Insufficient documentation

## 2023-04-25 DIAGNOSIS — R531 Weakness: Secondary | ICD-10-CM | POA: Insufficient documentation

## 2023-04-25 LAB — CBC WITH DIFFERENTIAL/PLATELET
Abs Immature Granulocytes: 0.06 10*3/uL (ref 0.00–0.07)
Basophils Absolute: 0 10*3/uL (ref 0.0–0.1)
Basophils Relative: 0 %
Eosinophils Absolute: 0.3 10*3/uL (ref 0.0–0.5)
Eosinophils Relative: 2 %
HCT: 34.2 % — ABNORMAL LOW (ref 39.0–52.0)
Hemoglobin: 11.5 g/dL — ABNORMAL LOW (ref 13.0–17.0)
Immature Granulocytes: 0 %
Lymphocytes Relative: 7 %
Lymphs Abs: 1 10*3/uL (ref 0.7–4.0)
MCH: 29.4 pg (ref 26.0–34.0)
MCHC: 33.6 g/dL (ref 30.0–36.0)
MCV: 87.5 fL (ref 80.0–100.0)
Monocytes Absolute: 0.9 10*3/uL (ref 0.1–1.0)
Monocytes Relative: 7 %
Neutro Abs: 11.5 10*3/uL — ABNORMAL HIGH (ref 1.7–7.7)
Neutrophils Relative %: 84 %
Platelets: 217 10*3/uL (ref 150–400)
RBC: 3.91 MIL/uL — ABNORMAL LOW (ref 4.22–5.81)
RDW: 13.2 % (ref 11.5–15.5)
WBC: 13.7 10*3/uL — ABNORMAL HIGH (ref 4.0–10.5)
nRBC: 0 % (ref 0.0–0.2)

## 2023-04-25 LAB — RESP PANEL BY RT-PCR (RSV, FLU A&B, COVID)  RVPGX2
Influenza A by PCR: NEGATIVE
Influenza B by PCR: NEGATIVE
Resp Syncytial Virus by PCR: NEGATIVE
SARS Coronavirus 2 by RT PCR: NEGATIVE

## 2023-04-25 LAB — URINALYSIS, ROUTINE W REFLEX MICROSCOPIC
Bacteria, UA: NONE SEEN
Bilirubin Urine: NEGATIVE
Glucose, UA: NEGATIVE mg/dL
Hgb urine dipstick: NEGATIVE
Leukocytes,Ua: NEGATIVE
Nitrite: NEGATIVE
Protein, ur: 30 mg/dL — AB
Specific Gravity, Urine: 1.018 (ref 1.005–1.030)
pH: 5.5 (ref 5.0–8.0)

## 2023-04-25 LAB — LACTIC ACID, PLASMA
Lactic Acid, Venous: 1.3 mmol/L (ref 0.5–1.9)
Lactic Acid, Venous: 0.7 mmol/L (ref 0.5–1.9)

## 2023-04-25 LAB — COMPREHENSIVE METABOLIC PANEL
ALT: 11 U/L (ref 0–44)
AST: 8 U/L — ABNORMAL LOW (ref 15–41)
Albumin: 3.6 g/dL (ref 3.5–5.0)
Alkaline Phosphatase: 119 U/L (ref 38–126)
Anion gap: 9 (ref 5–15)
BUN: 29 mg/dL — ABNORMAL HIGH (ref 6–20)
CO2: 21 mmol/L — ABNORMAL LOW (ref 22–32)
Calcium: 9.1 mg/dL (ref 8.9–10.3)
Chloride: 105 mmol/L (ref 98–111)
Creatinine, Ser: 2.48 mg/dL — ABNORMAL HIGH (ref 0.61–1.24)
GFR, Estimated: 31 mL/min — ABNORMAL LOW (ref >60)
Glucose, Bld: 109 mg/dL — ABNORMAL HIGH (ref 70–99)
Potassium: 4.5 mmol/L (ref 3.5–5.1)
Sodium: 135 mmol/L (ref 135–145)
Total Bilirubin: 0.6 mg/dL (ref <1.2)
Total Protein: 6.9 g/dL (ref 6.5–8.1)

## 2023-04-25 LAB — LIPASE, BLOOD: Lipase: 27 U/L (ref 11–51)

## 2023-04-25 MED ORDER — MORPHINE SULFATE (PF) 4 MG/ML IV SOLN
4.0000 mg | Freq: Once | INTRAVENOUS | Status: AC
  Administered 2023-04-25: 4 mg via INTRAVENOUS
  Filled 2023-04-25: qty 1

## 2023-04-25 MED ORDER — DOXYCYCLINE HYCLATE 100 MG PO TABS
100.0000 mg | ORAL_TABLET | Freq: Once | ORAL | Status: AC
  Administered 2023-04-25: 100 mg via ORAL
  Filled 2023-04-25: qty 1

## 2023-04-25 MED ORDER — SODIUM CHLORIDE 0.9 % IV BOLUS
1000.0000 mL | Freq: Once | INTRAVENOUS | Status: AC
  Administered 2023-04-25: 1000 mL via INTRAVENOUS

## 2023-04-25 MED ORDER — OXYCODONE HCL 5 MG PO TABS: 5.0000 mg | ORAL_TABLET | Freq: Four times a day (QID) | ORAL | 0 refills | Status: DC | PRN

## 2023-04-25 MED ORDER — DOXYCYCLINE HYCLATE 100 MG PO CAPS: 100.0000 mg | ORAL_CAPSULE | Freq: Two times a day (BID) | ORAL | 0 refills | Status: DC

## 2023-04-25 MED ORDER — ONDANSETRON 4 MG PO TBDP: 4.0000 mg | ORAL_TABLET | Freq: Three times a day (TID) | ORAL | 0 refills | Status: DC | PRN

## 2023-04-25 MED ORDER — LIDOCAINE-EPINEPHRINE (PF) 2 %-1:200000 IJ SOLN
10.0000 mL | Freq: Once | INTRAMUSCULAR | Status: AC
  Administered 2023-04-25: 10 mL
  Filled 2023-04-25: qty 20

## 2023-04-25 MED ORDER — ONDANSETRON HCL 4 MG/2ML IJ SOLN
4.0000 mg | Freq: Once | INTRAMUSCULAR | Status: AC
  Administered 2023-04-25: 4 mg via INTRAVENOUS
  Filled 2023-04-25: qty 2

## 2023-04-25 NOTE — ED Provider Notes (Signed)
[place] EMERGENCY DEPARTMENT AT [facility] Provider Note   CSN: [id number] Arrival date & time: 04/25/23  1119     History  Chief Complaint  Patient presents with   Emesis    Tyler Alvarez is a 47 y.o. male.  Patient with history of renal transplant on immunosuppression presents to the emergency department for evaluation of generalized weakness.  Symptoms started about 5 days ago and were worse over the past 2 days.  He reports fever at home as high as 44 F with associated chills.  He has had occasional coughing, nonproductive.  He has had some vomiting without diarrhea.  He reports some associated chest tightness, worse with coughing and deep breathing.  He thinks that his urine has been a little darker and he has been urinating a little less.  Previously on Depakote, not currently.  Patient also reports developing an abscess over the right lateral hip area, starting about a week ago.  This has continued to enlarge.  He thinks this may be in part what is causing his fever.  He has had history of abscesses in the past requiring drainage.      Home Medications Prior to Admission medications   Medication Sig Start Date End Date Taking? Authorizing Provider  acetaminophen (TYLENOL) 500 MG tablet Take 1,000 mg by mouth daily as needed for mild pain, headache or fever.    [provider]  aspirin EC 81 MG tablet Take 81 mg by mouth daily. Swallow whole.    [provider]  brompheniramine-pseudoephedrine (DIMETAPP) 1-15 MG/5ML ELIX Take 20 mLs by mouth daily as needed for allergies (Cold).    [provider]  Cholecalciferol (VITAMIN D3 PO) Take 1 tablet by mouth daily.    [provider]  divalproex (DEPAKOTE) 250 MG DR tablet TAKE 1 TABLET BY MOUTH TWICE A DAY 10/24/22   Sabino Dick, DO  doxycycline (VIBRAMYCIN) 100 MG capsule Take 1 capsule (100 mg total) by mouth 2 (two) times daily. 07/21/22   Garrison, [place] N, FNP   gabapentin (NEURONTIN) 300 MG capsule TAKE 1 CAPSULE BY MOUTH EVERYDAY AT BEDTIME 02/28/23   Levin Erp, MD  imipramine (TOFRANIL) 25 MG tablet Take 50 mg by mouth at bedtime.     [provider]  losartan (COZAAR) 25 MG tablet Take 25 mg by mouth daily. 03/31/21   [provider]  ondansetron (ZOFRAN-ODT) 4 MG disintegrating tablet Take 1 tablet (4 mg total) by mouth every 8 (eight) hours as needed for nausea or vomiting. 07/29/22   Curatolo, Adam, DO  predniSONE (DELTASONE) 5 MG tablet Take 5 mg by mouth daily.     [provider]  tacrolimus (PROGRAF) 1 MG capsule Take 2 mg by mouth 2 (two) times daily.     [provider]      Allergies    Erythromycin and Sulfa antibiotics    Review of Systems   Review of Systems  Physical Exam Updated Vital Signs BP 124/77 (BP Location: Right Arm)   Pulse 96   Temp 98.2 F (36.8 C)   Resp 16   Ht 6' (1.829 m)   Wt 54.4 kg   SpO2 98%   BMI 16.27 kg/m   Physical Exam Vitals and nursing note reviewed.  Constitutional:      General: He is not in acute distress.    Appearance: He is well-developed.     Comments: Thin  HENT:     Head: Normocephalic and atraumatic.  Right Ear: External ear normal.     Left Ear: External ear normal.     Nose: Nose normal.     Mouth/Throat:     Mouth: Mucous membranes are moist.  Eyes:     General:        Right eye: No discharge.        Left eye: No discharge.     Conjunctiva/sclera: Conjunctivae normal.  Cardiovascular:     Rate and Rhythm: Normal rate and regular rhythm.     Heart sounds: Normal heart sounds.  Pulmonary:     Effort: Pulmonary effort is normal.     Breath sounds: Normal breath sounds.  Abdominal:     Palpations: Abdomen is soft.     Tenderness: There is no abdominal tenderness. There is no guarding or rebound.     Comments: Transplanted kidney left lower quadrant abdomen without overlying tenderness.  Healed surgical scars noted on  upper abdomen.  Musculoskeletal:     Cervical back: Normal range of motion and neck supple.  Skin:    General: Skin is warm and dry.     Comments: Abscess noted to the right lateral hip area with overlying erythema.  Associated induration noted with tenderness to palpation.  Measures approximately 7 cm in diameter.  There is some fluctuance.  Neurological:     Mental Status: He is alert.    ED Results / Procedures / Treatments   Labs (all labs ordered are listed, but only abnormal results are displayed) Labs Reviewed  CBC WITH DIFFERENTIAL/PLATELET - Abnormal; Notable for the following components:      Result Value   WBC 13.7 (*)    RBC 3.91 (*)    Hemoglobin 11.5 (*)    HCT 34.2 (*)    Neutro Abs 11.5 (*)    All other components within normal limits  COMPREHENSIVE METABOLIC PANEL - Abnormal; Notable for the following components:   CO2 21 (*)    Glucose, Bld 109 (*)    BUN 29 (*)    Creatinine, Ser 2.48 (*)    AST 8 (*)    GFR, Estimated 31 (*)    All other components within normal limits  URINALYSIS, ROUTINE W REFLEX MICROSCOPIC - Abnormal; Notable for the following components:   Ketones, ur TRACE (*)    Protein, ur 30 (*)    All other components within normal limits  RESP PANEL BY RT-PCR (RSV, FLU A&B, COVID)  RVPGX2  LIPASE, BLOOD  LACTIC ACID, PLASMA  LACTIC ACID, PLASMA    EKG None  Radiology DG Chest 2 View  Result Date: 04/25/2023 CLINICAL DATA:  Cough.  Fever. EXAM: CHEST - 2 VIEW COMPARISON:  02/25/2021. FINDINGS: Bilateral lung fields are clear. Bilateral costophrenic angles are clear. Stable cardio-mediastinal silhouette. No acute osseous abnormalities. The soft tissues are within normal limits. There are surgical clips in the right upper quadrant, typical of a previous cholecystectomy. IMPRESSION: No active cardiopulmonary disease. Electronically Signed   By: Jules Schick M.D.   On: 04/25/2023 15:16    Procedures .Incision and Drainage  Date/Time:  04/25/2023 5:36 PM  Performed by: Renne Crigler, PA-C Authorized by: Renne Crigler, PA-C   Consent:    Consent obtained:  Verbal   Consent given by:  Patient   Risks discussed:  Bleeding, incomplete drainage, pain and infection Universal protocol:    Patient identity confirmed:  Verbally with patient and provided demographic data Location:    Type:  Abscess   Size:  7cm  Location: R buttock. Pre-procedure details:    Skin preparation:  Povidone-iodine Anesthesia:    Anesthesia method:  Local infiltration   Local anesthetic:  Lidocaine 2% WITH epi Procedure type:    Complexity:  Complex Procedure details:    Incision types:  Stab incision   Wound management:  Probed and deloculated   Drainage:  Purulent   Drainage amount:  Copious   Packing materials:  1/4 in iodoform gauze Post-procedure details:    Procedure completion:  Tolerated well, no immediate complications     Medications Ordered in ED Medications - No data to display  ED Course/ Medical Decision Making/ A&P    Patient seen and examined. History obtained directly from patient.   Labs/EKG: Ordered CBC, CMP, lipase, lactate, UA, respiratory panel.  Imaging: Ordered chest x-ray  Medications/Fluids: Ordered: IV fluid bolus, Zofran  Most recent vital signs reviewed and are as follows: BP 129/68 (BP Location: Right Arm)   Pulse 87   Temp 98.2 F (36.8 C)   Resp 16   Ht 6' (1.829 m)   Wt 54.4 kg   SpO2 99%   BMI 16.27 kg/m   Initial impression: Generalized weakness the patient with history of renal transplant, immunocompromise.   Reassessment performed. Patient appears comfortable.  No vomiting.  Bedside ultrasound shows significant fluid collection associated with the abscess.  Labs personally reviewed and interpreted including: CBC with elevated white blood cell count at 13.7, hemoglobin slightly low 11.5; CMP creatinine elevated at 2.48 but stable over the past year and a half, BUN elevated 29,  normal liver function testing; lipase normal; UA without compelling signs of infection; lactic acid 1.3 > 0.7; flu, COVID, RSV negative.  Imaging personally visualized and interpreted including: Chest x-ray negative  Reviewed pertinent lab work and imaging with patient at bedside. Questions answered.   Plan: I&D, p.o. challenge  5:39 PM Reassessment performed. Patient appears comfortable.  Patient has tolerated oral antibiotics.  Tolerated I&D procedure well.  Reviewed pertinent lab work and imaging with patient at bedside. Questions answered.   Most current vital signs reviewed and are as follows: BP (!) 146/80 (BP Location: Right Arm)   Pulse 88   Temp 98.2 F (36.8 C)   Resp 16   Ht 6' (1.829 m)   Wt 54.4 kg   SpO2 98%   BMI 16.27 kg/m   Plan: Discharge to home.   Prescriptions written for: Doxycycline, Zofran, # 8 tablets oxycodone 5 mg.  Other home care instructions discussed: Close monitoring of symptoms, wound care.  The patient was urged to return to the Emergency Department urgently with worsening pain, swelling, expanding erythema especially if it streaks away from the affected area, fever, or if they have any other concerns.   The patient was urged to return to the Emergency Department or go to their PCP in 48 hours for packing removal and wound recheck.   The patient verbalized understanding and stated agreement with this plan.                                 Medical Decision Making Amount and/or Complexity of Data Reviewed Labs: ordered. Radiology: ordered.  Risk Prescription drug management.   Patient originally presented with vomiting and cough, however his main issue seems to be abscess on right buttock.  Reports fever at home but no elevated temperature here or today.  No tachycardia.  Lactate is normal.  He is  higher risk due to immunocompromise due to medications in relation to his renal transplant.  Creatinine appears stable.  Abscess was able to be  well drained today.  Patient continues to look well and has tolerated p.o.'s here.  Considered admission however given patient's well appearance will give trial of antibiotics for home with very close and strict return instructions.  Encouraged return in 48 hours for recheck and packing removal.        Final Clinical Impression(s) / ED Diagnoses Final diagnoses:  Cutaneous abscess of buttock  Nausea and vomiting, unspecified vomiting type  Acute cough    Rx / DC Orders ED Discharge Orders          Ordered    doxycycline (VIBRAMYCIN) 100 MG capsule  2 times daily        04/25/23 1733    oxyCODONE (OXY IR/ROXICODONE) 5 MG immediate release tablet  Every 6 hours PRN        04/25/23 1733    ondansetron (ZOFRAN-ODT) 4 MG disintegrating tablet  Every 8 hours PRN        04/25/23 1733              Renne Crigler, PA-C 04/25/23 1743    Royanne Foots, DO 04/29/23 2032

## 2023-04-25 NOTE — Discharge Instructions (Addendum)
Please read and follow all provided instructions.  Your diagnoses today include:  1. Cutaneous abscess of buttock   2. Nausea and vomiting, unspecified vomiting type   3. Acute cough     Tests performed today include: Vital signs. See below for your results today.  Complete blood cell count: White blood cell count was a little high Basic metabolic panel: Kidney function creatinine elevated but stable COVID, flu, RSV testing: Was negative Chest x-ray was clear Lactic acid levels to test for sepsis were both normal  Medications prescribed:  Doxycycline - antibiotic  You have been prescribed an antibiotic medicine: take the entire course of medicine even if you are feeling better. Stopping early can cause the antibiotic not to work.  Oxycodone - narcotic pain medication  DO NOT drive or perform any activities that require you to be awake and alert because this medicine can make you drowsy.   Zofran (ondansetron) - for nausea and vomiting  Take any prescribed medications only as directed.   Home care instructions:  Follow any educational materials contained in this packet  Follow-up instructions: Please return to the emergency department or see your doctor/surgeon in 48 to 72 hours for wound recheck and packing removal.  Return instructions:  Return to the Emergency Department if you have: Fever Worsening symptoms Worsening pain Worsening swelling Redness of the skin that moves away from the affected area, especially if it streaks away from the affected area  Any other emergent concerns  Your vital signs today were: BP (!) 146/80 (BP Location: Right Arm)   Pulse 88   Temp 98.2 F (36.8 C)   Resp 16   Ht 6' (1.829 m)   Wt 54.4 kg   SpO2 98%   BMI 16.27 kg/m  If your blood pressure (BP) was elevated above 135/85 this visit, please have this repeated by your doctor within one month. --------------

## 2023-04-25 NOTE — ED Notes (Signed)
Pt is kidney transplant patient (2002)

## 2023-04-25 NOTE — ED Triage Notes (Signed)
Pt via pov from home with emesis, fever, chills since Friday. Pt also reports some lightheadedness and headache. He reports no PO intake of food x 2 days. Pt is able to keep water and ginger ale down. Pt alert & oriented, pale in triage.

## 2023-04-25 NOTE — ED Notes (Signed)
Fluid challenge performed by different RN... They stated that he was able to drink the fluids/keep it down and didn't become N/V.Marland KitchenMarland Kitchen

## 2023-04-25 NOTE — ED Notes (Signed)
Discharge paperwork given and verbally understood.... Provider aware of the V/S and cleared to go home.Marland KitchenMarland Kitchen

## 2023-04-27 ENCOUNTER — Other Ambulatory Visit (HOSPITAL_BASED_OUTPATIENT_CLINIC_OR_DEPARTMENT_OTHER): Payer: Self-pay

## 2023-04-27 ENCOUNTER — Emergency Department (HOSPITAL_BASED_OUTPATIENT_CLINIC_OR_DEPARTMENT_OTHER)
Admission: EM | Admit: 2023-04-27 | Discharge: 2023-04-27 | Disposition: A | Discharge status: Home / Self Care | Payer: 59 | Attending: Emergency Medicine | Admitting: Emergency Medicine

## 2023-04-27 ENCOUNTER — Emergency Department (HOSPITAL_BASED_OUTPATIENT_CLINIC_OR_DEPARTMENT_OTHER): Payer: 59

## 2023-04-27 ENCOUNTER — Encounter (HOSPITAL_BASED_OUTPATIENT_CLINIC_OR_DEPARTMENT_OTHER): Payer: Self-pay

## 2023-04-27 ENCOUNTER — Other Ambulatory Visit: Payer: Self-pay

## 2023-04-27 DIAGNOSIS — Z940 Kidney transplant status: Principal | ICD-10-CM

## 2023-04-27 DIAGNOSIS — Z79899 Other long term (current) drug therapy: Principal | ICD-10-CM

## 2023-04-27 DIAGNOSIS — D631 Anemia in chronic kidney disease: Principal | ICD-10-CM

## 2023-04-27 DIAGNOSIS — N1832 Anemia of chronic renal failure, stage 3b (CMS-HCC): Principal | ICD-10-CM

## 2023-04-27 DIAGNOSIS — E559 Vitamin D deficiency, unspecified: Principal | ICD-10-CM

## 2023-04-27 DIAGNOSIS — R0789 Other chest pain: Secondary | ICD-10-CM | POA: Diagnosis not present

## 2023-04-27 DIAGNOSIS — R42 Dizziness and giddiness: Secondary | ICD-10-CM | POA: Diagnosis not present

## 2023-04-27 DIAGNOSIS — M79671 Pain in right foot: Secondary | ICD-10-CM | POA: Insufficient documentation

## 2023-04-27 DIAGNOSIS — L0231 Cutaneous abscess of buttock: Secondary | ICD-10-CM | POA: Insufficient documentation

## 2023-04-27 DIAGNOSIS — M79674 Pain in right toe(s): Secondary | ICD-10-CM

## 2023-04-27 DIAGNOSIS — Z7982 Long term (current) use of aspirin: Secondary | ICD-10-CM | POA: Diagnosis not present

## 2023-04-27 DIAGNOSIS — Z4800 Encounter for change or removal of nonsurgical wound dressing: Secondary | ICD-10-CM | POA: Diagnosis not present

## 2023-04-27 DIAGNOSIS — R011 Cardiac murmur, unspecified: Secondary | ICD-10-CM | POA: Insufficient documentation

## 2023-04-27 DIAGNOSIS — L0291 Cutaneous abscess, unspecified: Secondary | ICD-10-CM

## 2023-04-27 DIAGNOSIS — I700 Atherosclerosis of aorta: Secondary | ICD-10-CM | POA: Diagnosis not present

## 2023-04-27 DIAGNOSIS — M109 Gout, unspecified: Secondary | ICD-10-CM | POA: Diagnosis not present

## 2023-04-27 LAB — CBC
HCT: 31.1 % — ABNORMAL LOW (ref 39.0–52.0)
Hemoglobin: 10.5 g/dL — ABNORMAL LOW (ref 13.0–17.0)
MCH: 29.7 pg (ref 26.0–34.0)
MCHC: 33.8 g/dL (ref 30.0–36.0)
MCV: 88.1 fL (ref 80.0–100.0)
Platelets: 178 10*3/uL (ref 150–400)
RBC: 3.53 MIL/uL — ABNORMAL LOW (ref 4.22–5.81)
RDW: 13.2 % (ref 11.5–15.5)
WBC: 8.2 10*3/uL (ref 4.0–10.5)
nRBC: 0 % (ref 0.0–0.2)

## 2023-04-27 LAB — BASIC METABOLIC PANEL
Anion gap: 9 (ref 5–15)
BUN: 32 mg/dL — ABNORMAL HIGH (ref 6–20)
CO2: 18 mmol/L — ABNORMAL LOW (ref 22–32)
Calcium: 9.1 mg/dL (ref 8.9–10.3)
Chloride: 107 mmol/L (ref 98–111)
Creatinine, Ser: 2.76 mg/dL — ABNORMAL HIGH (ref 0.61–1.24)
GFR, Estimated: 27 mL/min — ABNORMAL LOW (ref >60)
Glucose, Bld: 104 mg/dL — ABNORMAL HIGH (ref 70–99)
Potassium: 5.7 mmol/L — ABNORMAL HIGH (ref 3.5–5.1)
Sodium: 134 mmol/L — ABNORMAL LOW (ref 135–145)

## 2023-04-27 LAB — TROPONIN I (HIGH SENSITIVITY)
Troponin I (High Sensitivity): 7 ng/L (ref <18)
Troponin I (High Sensitivity): 8 ng/L (ref <18)

## 2023-04-27 MED ORDER — MORPHINE SULFATE (PF) 4 MG/ML IV SOLN
4.0000 mg | Freq: Once | INTRAVENOUS | Status: AC
  Administered 2023-04-27: 2 mg via INTRAVENOUS
  Filled 2023-04-27: qty 1

## 2023-04-27 MED ORDER — ONDANSETRON HCL 4 MG/2ML IJ SOLN
4.0000 mg | Freq: Once | INTRAMUSCULAR | Status: AC
  Administered 2023-04-27: 4 mg via INTRAVENOUS
  Filled 2023-04-27: qty 2

## 2023-04-27 NOTE — ED Provider Notes (Signed)
[place] EMERGENCY DEPARTMENT AT [facility] Provider Note   CSN: [id number] Arrival date & time: 04/27/23  1259     History  Chief Complaint  Patient presents with   Wound Check    Tyler Alvarez is a 47 y.o. male who presents for wound check of right gluteal abscess.  Was here on 12/11 where an I&D was performed.  Packing was put in place and discharged on Doxy.  He denies any fevers or chills.  Reports improvement of pain of the area.  Today he additionally complains of dizziness and chest heaviness.  States symptoms started when he started walking a distance to get to the car today.  On 12/11 he did have reports of nonproductive cough.  Chest x-ray was negative states dizziness is constant.  Denies any shortness of breath.  Does have a history of ascending aortic aneurysm and murmur otherwise no other cardiac history.  Denies any calf extremity swelling or pain.  Does complain of right great toe pain and swelling that started approximately day 2 ago.  Denies any obvious injury or trauma.  Does have a history of gout.  Reports the symptoms feel similar to those.  He is not on any chronic gout medications.   Wound Check       Home Medications Prior to Admission medications   Medication Sig Start Date End Date Taking? Authorizing Provider  acetaminophen (TYLENOL) 500 MG tablet Take 1,000 mg by mouth daily as needed for mild pain, headache or fever.    [provider]  aspirin EC 81 MG tablet Take 81 mg by mouth daily. Swallow whole.    [provider]  brompheniramine-pseudoephedrine (DIMETAPP) 1-15 MG/5ML ELIX Take 20 mLs by mouth daily as needed for allergies (Cold).    [provider]  Cholecalciferol (VITAMIN D3 PO) Take 1 tablet by mouth daily.    [provider]  divalproex (DEPAKOTE) 250 MG DR tablet TAKE 1 TABLET BY MOUTH TWICE A DAY 10/24/22   Sabino Dick, DO  doxycycline (VIBRAMYCIN) 100 MG capsule Take 1 capsule (100  mg total) by mouth 2 (two) times daily. 04/25/23   Renne Crigler, PA-C  gabapentin (NEURONTIN) 300 MG capsule TAKE 1 CAPSULE BY MOUTH EVERYDAY AT BEDTIME 02/28/23   Levin Erp, MD  imipramine (TOFRANIL) 25 MG tablet Take 50 mg by mouth at bedtime.     [provider]  losartan (COZAAR) 25 MG tablet Take 25 mg by mouth daily. 03/31/21   [provider]  ondansetron (ZOFRAN-ODT) 4 MG disintegrating tablet Take 1 tablet (4 mg total) by mouth every 8 (eight) hours as needed for nausea or vomiting. 04/25/23   Renne Crigler, PA-C  oxyCODONE (OXY IR/ROXICODONE) 5 MG immediate release tablet Take 1 tablet (5 mg total) by mouth every 6 (six) hours as needed for severe pain (pain score 7-10). 04/25/23   Renne Crigler, PA-C  predniSONE (DELTASONE) 5 MG tablet Take 5 mg by mouth daily.     [provider]  tacrolimus (PROGRAF) 1 MG capsule Take 2 mg by mouth 2 (two) times daily.     [provider]      Allergies    Erythromycin and Sulfa antibiotics    Review of Systems   Review of Systems  Musculoskeletal:  Positive for arthralgias and myalgias.    Physical Exam Updated Vital Signs BP (!) 152/77   Pulse 87   Temp 98.2 F (36.8 C) (Oral)   Resp 16   SpO2 95%  Physical Exam Vitals and nursing note reviewed.  Constitutional:      General: He is not in acute distress.    Appearance: He is well-developed.  HENT:     Head: Normocephalic and atraumatic.  Eyes:     Conjunctiva/sclera: Conjunctivae normal.  Cardiovascular:     Rate and Rhythm: Normal rate and regular rhythm.     Heart sounds: Murmur heard.  Pulmonary:     Effort: Pulmonary effort is normal. No respiratory distress.     Breath sounds: Normal breath sounds.  Abdominal:     Palpations: Abdomen is soft.     Tenderness: There is no abdominal tenderness.  Musculoskeletal:     Cervical back: Neck supple.     Comments: Right great toe: Swelling, mild erythema and increased warmth to MP  joint with associated tenderness.  Pain with any movement  Skin:    General: Skin is warm and dry.     Capillary Refill: Capillary refill takes less than 2 seconds.     Comments: Right gluteal abscess with packing in place, removed without any purulent drainage.  There there is still some residual surrounding erythema and induration.  No fluctuance or increased warmth.  Minimal tenderness  Neurological:     Mental Status: He is alert.  Psychiatric:        Mood and Affect: Mood normal.     ED Results / Procedures / Treatments   Labs (all labs ordered are listed, but only abnormal results are displayed) Labs Reviewed  BASIC METABOLIC PANEL - Abnormal; Notable for the following components:      Result Value   Sodium 134 (*)    Potassium 5.7 (*)    CO2 18 (*)    Glucose, Bld 104 (*)    BUN 32 (*)    Creatinine, Ser 2.76 (*)    GFR, Estimated 27 (*)    All other components within normal limits  CBC - Abnormal; Notable for the following components:   RBC 3.53 (*)    Hemoglobin 10.5 (*)    HCT 31.1 (*)    All other components within normal limits  TROPONIN I (HIGH SENSITIVITY)  TROPONIN I (HIGH SENSITIVITY)    EKG EKG Interpretation Date/Time:  Friday April 27 2023 14:10:16 EST Ventricular Rate:  78 PR Interval:  126 QRS Duration:  85 QT Interval:  394 QTC Calculation: 449 R Axis:   101  Text Interpretation: Sinus rhythm Right axis deviation Consider left ventricular hypertrophy Confirmed by Benjiman Core [phone]) on 04/27/2023 2:47:57 PM  Radiology DG Chest Port 1 View Result Date: 04/27/2023 CLINICAL DATA:  Chest heaviness, wound recheck.  Gout. EXAM: PORTABLE CHEST 1 VIEW COMPARISON:  04/25/2023 FINDINGS: The patient is rotated to the left on today's radiograph, reducing diagnostic sensitivity and specificity. Heart size within normal limits for projection. Mild thoracic aortic atheromatous vascular calcification. No discrete airspace opacity identified.  No  pneumomediastinum. IMPRESSION: 1. No acute findings. 2.  Aortic Atherosclerosis (ICD10-I70.0). Electronically Signed   By: Gaylyn Rong M.D.   On: 04/27/2023 16:36     Procedures Procedures    Medications Ordered in ED Medications  morphine (PF) 4 MG/ML injection 4 mg (2 mg Intravenous Given 04/27/23 1654)  ondansetron (ZOFRAN) injection 4 mg (4 mg Intravenous Given 04/27/23 1653)    ED Course/ Medical Decision Making/ A&P  Medical Decision Making Amount and/or Complexity of Data Reviewed Labs: ordered. Radiology: ordered.   This patient presents to the ED with chief complaint(s) of wound check, dizziness, chest heaviness, right great toe pain.  The complaint involves an extensive differential diagnosis and also carries with it a high risk of complications and morbidity.   pertinent past medical history as listed in HPI  The differential diagnosis includes  Persisting complicated abscess, improving abscess, ACS, PE, AAA, gout, cellulitis, septic joint The initial plan is to  Will obtain basic labs, EKG and chest x-ray Additional history obtained: Additional history obtained from family Records reviewed previous admission documents  Initial Assessment:   Patient is chronically ill-appearing in no acute distress.  Abscess appears to be healing well, no purulent drainage, do not feel that additional packing is warranted.  Right great toe is consistent with gout, he has a history of this.  Does not appear to be cellulitic.  Low concern for septic joint.  Patient does have some dizziness at baseline, however this appears to be worse than normal.  Independent ECG interpretation:  Normal sinus rhythm with LVH  Independent labs interpretation:  The following labs were independently interpreted:  CBC notable for mild drop in hemoglobin from 2 days ago, BMP with mild hyponatremia and hyper per kalemia 5.7, CKD with creatinine of 2.76, appears to be  near baseline  Independent visualization and interpretation of imaging: I independently visualized the following imaging with scope of interpretation limited to determining acute life threatening conditions related to emergency care: Chest x-ray, which revealed no acute abnormality  Treatment and Reassessment: Upon reassessment at 4:40 PM patient continues to complain of pain worse in his great toe in addition to dizziness.  Will give morphine and Zofran.  Unable to express any purulence from abscess.  Consultations obtained:   Pharmacy 5:20 pm Called about renal dosing for colchicine.  Recommended colchicine 0.6 q 3 days,  And allopurinol 50 mg q every other day  Disposition:   Patient will be discharged home on colchicine 0.6 mg every 3 days x 3 doses. Encouraged to follow up with PCP within the next 5 days. Encouraged to complete course of Abx for abscess.  Instructed on daily washes with warm soapy water. The patient has been appropriately medically screened and/or stabilized in the ED. I have low suspicion for any other emergent medical condition which would require further screening, evaluation or treatment in the ED or require inpatient management. At time of discharge the patient is hemodynamically stable and in no acute distress. I have discussed work-up results and diagnosis with patient and answered all questions. Patient is agreeable with discharge plan. We discussed strict return precautions for returning to the emergency department and they verbalized understanding.     Social Determinants of Health:   none  This note was dictated with voice recognition software.  Despite best efforts at proofreading, errors may have occurred which can change the documentation meaning.          Final Clinical Impression(s) / ED Diagnoses Final diagnoses:  Abscess  Pain of toe of right foot  Dizziness  Atypical chest pain    Rx / DC Orders ED Discharge Orders     None          Halford Decamp, PA-C 04/27/23 1818    Benjiman Core, MD 04/30/23 1438

## 2023-04-27 NOTE — Discharge Instructions (Addendum)
It was a pleasure taking care of you today.  You were evaluated emergency room for wound check.  As discussed it appears that your abscess is healing well.  Please continue to take the antibiotics as prescribed.  Your lab work, EKG and chest x-ray did not show any acute abnormality.  As discussed your toe pain is consistent with gout.  A prescription for this has been sent into your pharmacy.  Please take a pill every 3 days.  Please follow-up with your primary care provider to discuss continued treatment.  If you experience any new or worsening symptoms including fevers, chills, worsening pain in your abscess please return to the emergency room.  As discussed please wash the abscess area daily with warm soapy water and apply sterile dressing.

## 2023-04-27 NOTE — ED Triage Notes (Signed)
Pt here to have wound rechecked, advises that it has packing in it. States that he is feeling "very flushed" today.  Pt also states he "might have a flare up of gout- R foot."

## 2023-05-04 ENCOUNTER — Other Ambulatory Visit: Payer: Self-pay | Admitting: Student

## 2023-05-07 MED ORDER — AMLODIPINE 5 MG TABLET
ORAL_TABLET | Freq: Every evening | ORAL | 3 refills | 0.00 days
Start: 2023-05-07 — End: ?

## 2023-05-10 MED ORDER — AMLODIPINE 5 MG TABLET
ORAL_TABLET | Freq: Every evening | ORAL | 3 refills | 90 days | Status: CP
Start: 2023-05-10 — End: ?

## 2023-05-21 ENCOUNTER — Other Ambulatory Visit: Payer: Self-pay | Admitting: Student

## 2023-05-22 DIAGNOSIS — Z940 Kidney transplant status: Principal | ICD-10-CM

## 2023-05-22 MED ORDER — PROGRAF 1 MG CAPSULE
ORAL_CAPSULE | Freq: Two times a day (BID) | ORAL | 3 refills | 90.00 days
Start: 2023-05-22 — End: 2024-05-21

## 2023-05-22 MED ORDER — PREDNISONE 5 MG TABLET
ORAL_TABLET | Freq: Every day | ORAL | 3 refills | 90.00 days
Start: 2023-05-22 — End: 2024-05-21

## 2023-05-23 DIAGNOSIS — Z940 Kidney transplant status: Principal | ICD-10-CM

## 2023-05-23 MED ORDER — PREDNISONE 5 MG TABLET
ORAL_TABLET | Freq: Every day | ORAL | 3 refills | 90.00 days | Status: CP
Start: 2023-05-23 — End: 2024-05-22
  Filled 2023-07-10: qty 90, 90d supply, fill #0

## 2023-05-23 MED ORDER — PROGRAF 1 MG CAPSULE
ORAL_CAPSULE | Freq: Two times a day (BID) | ORAL | 3 refills | 90.00 days | Status: CP
Start: 2023-05-23 — End: 2024-05-22
  Filled 2023-07-10: qty 120, 30d supply, fill #0

## 2023-05-23 NOTE — Unmapped (Signed)
 Pt request for RX Refill

## 2023-05-30 ENCOUNTER — Other Ambulatory Visit (HOSPITAL_BASED_OUTPATIENT_CLINIC_OR_DEPARTMENT_OTHER): Payer: Self-pay

## 2023-05-30 ENCOUNTER — Ambulatory Visit (HOSPITAL_BASED_OUTPATIENT_CLINIC_OR_DEPARTMENT_OTHER): Payer: 59 | Admitting: Family Medicine

## 2023-05-30 ENCOUNTER — Encounter (HOSPITAL_BASED_OUTPATIENT_CLINIC_OR_DEPARTMENT_OTHER): Payer: Self-pay | Admitting: Family Medicine

## 2023-05-30 ENCOUNTER — Other Ambulatory Visit: Payer: Self-pay

## 2023-05-30 VITALS — BP 113/80 | HR 103 | Ht 72.0 in | Wt 116.8 lb

## 2023-05-30 DIAGNOSIS — I10 Essential (primary) hypertension: Secondary | ICD-10-CM | POA: Diagnosis not present

## 2023-05-30 DIAGNOSIS — Z1211 Encounter for screening for malignant neoplasm of colon: Secondary | ICD-10-CM | POA: Diagnosis not present

## 2023-05-30 DIAGNOSIS — R636 Underweight: Secondary | ICD-10-CM

## 2023-05-30 DIAGNOSIS — G2581 Restless legs syndrome: Secondary | ICD-10-CM

## 2023-05-30 DIAGNOSIS — Z23 Encounter for immunization: Secondary | ICD-10-CM | POA: Diagnosis not present

## 2023-05-30 DIAGNOSIS — Z940 Kidney transplant status: Secondary | ICD-10-CM | POA: Diagnosis not present

## 2023-05-30 DIAGNOSIS — Z681 Body mass index (BMI) 19 or less, adult: Secondary | ICD-10-CM

## 2023-05-30 DIAGNOSIS — I729 Aneurysm of unspecified site: Secondary | ICD-10-CM | POA: Diagnosis not present

## 2023-05-30 MED ORDER — GABAPENTIN 300 MG PO CAPS
300.0000 mg | ORAL_CAPSULE | Freq: Every day | ORAL | 3 refills | Status: DC
  Filled 2023-05-30 (×2): qty 90, 90d supply, fill #0

## 2023-05-30 NOTE — Patient Instructions (Addendum)
 Please call to see if Jolena Nay needs a repeat scan for his aortic aneurysm. Last scan was in January 2024. If you would like to transfer care to [facility] Cardiology let me know and I will place referral.  [facility] [address], [place], [place] [address]  45 mi [phone]

## 2023-05-30 NOTE — Progress Notes (Signed)
 New Patient Office Visit  Subjective:   Tyler Alvarez [birth year] 05/30/2023  Chief Complaint  Patient presents with   New Patient (Initial Visit)    Patient is here to get established with the practice. Is needing to have his gabapentin  refilled.    HPI: Tyler Alvarez presents today to establish care at [facility]. Introduced to Publishing rights manager role and practice setting.  All questions answered.   Last PCP: Tyler Dibble, MD  Last annual physical: Several years  Concerns: See below    Restless Legs:  Patient reports chronic history of restless leg syndrome that is treated with gabapentin .  Patient states that gabapentin  300 mg once at nighttime keeps restless legs under control.  He denies any adverse side effects or symptoms of the gabapentin .  He is needing a refill on his medication.  Aortic aneurysm: Per chart review patient's last surveillance of his ascending aortic aneurysm of 4.2 cm was performed in January 2024 with [facility] cardiac surgery clinic.  Patient patient's mother accompanying him today states she believes he was supposed to return annually, but is not sure.  Patient will be going to [facility] at the end of January for follow-up with nephrology and transplant team.  She will speak with [facility] team regarding surveillance of aortic aneurysm.  Status post renal transplant: Patient is managed by [facility] nephrology and transplant team.  He is currently on tacrolimus  for immunosuppression.  He is requesting updating his tetanus and flu shot today while in the office.  He will be seeing nephrology in January 2025.   HYPERTENSION: [name] presents for the medical management of hypertension.  Patient's current hypertension medication regimen is: Losartan  25mg  and Amlodipine  5mg   Patient is  currently taking prescribed medications for HTN.  Patient is  regularly keeping a check on BP at home.  Adhering to low sodium diet:  Yes Denies headache, dizziness, CP, SHOB, vision changes.    BP Readings from Last 3 Encounters:  05/30/23 113/80  04/27/23 (!) 152/77  04/25/23 (!) 149/76     The following portions of the patient's history were reviewed and updated as appropriate: past medical history, past surgical history, family history, social history, allergies, medications, and problem list.   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   Aftercare following organ transplant 05/23/2011   Attention deficit hyperactivity disorder (ADHD) 04/20/2011   Partial epilepsy with impairment of consciousness (HCC) 04/20/2011   HEARING LOSS, LEFT EAR 11/25/2008   Past Medical History:  Diagnosis Date   Abscess 03/22/2021   ADD (attention deficit disorder)    Anxiety    Ascending aortic aneurysm (HCC)    Benign skin lesion of neck    left neck cyst   Cellulitis 02/24/2021   Cellulitis and abscess of buttock    Chronic kidney disease    s/p transplant 2001   Community acquired pneumonia 01/03/2020   Family history of adverse reaction  to anesthesia    Pt mother stated Father-"I think that he stopped breathing, they called a code but he came through okay"   GERD (gastroesophageal reflux disease)    Headache    Migraines   Heart murmur    Hypertension    Immune deficiency disorder (HCC)    Restless leg syndrome    Seizures (HCC)    treated by Dr. Walden Guise, no seizures in years and years   Sepsis (HCC)  02/25/2021   Sepsis without acute organ dysfunction (HCC)    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HCC)    Wears glasses    Past Surgical History:  Procedure Laterality Date   AV FISTULA PLACEMENT     KIDNEY TRANSPLANT  2001   LESION EXCISION WITH COMPLEX REPAIR Left 05/24/2021   Procedure: EXCISION BENIGN LESION NECK 9CM, LAYERED CLOSURE NECK 10CM;  Surgeon: Alger Infield, MD;  Location: [facility] OR;  Service: Plastics;  Laterality: Left;   MASS EXCISION Left 12/11/2016   Procedure: EXCISION OF BENIGN LESION OF THE NECK WITH LAYERED CLOSURE;  Surgeon: Alger Infield, MD;  Location: [facility] OR;  Service: Plastics;  Laterality: Left;   MASS EXCISION     neck x2   MASS EXCISION Bilateral 04/01/2018   Procedure: excision benign lesions left neck and right and left cheek ( 4cm, 2cm, 1cm x3), layered closure neck <10cm, layered closure right cheek 1cm;  Surgeon: Alger Infield, MD;  Location: [facility] OR;  Service: Plastics;  Laterality: Bilateral;  left cheek lesion   MULTIPLE TOOTH EXTRACTIONS     NEPHRECTOMY     both removed in 1999, 3 months apart   REVISON OF ARTERIOVENOUS FISTULA Left 07/16/2014   Procedure: EXCISION ANEURYSMAL AREA OF LEFT ARTERIOVENOUS FISTULA;  Surgeon: Margherita Shell, MD;  Location: [facility] OR;  Service: Vascular;  Laterality: Left;   Family History  Problem Relation Age of Onset   Hypothyroidism Mother    Cancer Mother 59       cervical   Hypertension Mother    COPD Mother    COPD Father 41       COPD   Ulcers Father    COPD Brother    Heart disease Brother    Cancer Other    Social History   Socioeconomic History   Marital status: Single    Spouse name: Not on file   Number of children: Not on file   Years of education: Not on file   Highest education level: Not on file  Occupational History   Not on file  Tobacco Use   Smoking status: Never   Smokeless tobacco: Never  Vaping Use   Vaping status: Never Used  Substance and Sexual Activity   Alcohol use: No   Drug use: No    Sexual activity: Yes  Other Topics Concern   Not on file  Social History Narrative   Caffeine one daily.  Monster every other day.  Lives at home with mom.  Education: 8th grade.   Single.  None children.     Social Drivers of Corporate investment banker Strain: Low Risk  (12/15/2022)   Overall Financial Resource Strain (CARDIA)    Difficulty of Paying Living Expenses: Not hard at all  Food Insecurity: No Food Insecurity (12/15/2022)   Hunger Vital Sign    Worried About Running Out of Food in the Last Year: Never true    Ran Out of Food in the Last Year: Never true  Transportation Needs: No Transportation Needs (12/15/2022)  PRAPARE - Administrator, Civil Service (Medical): No    Lack of Transportation (Non-Medical): No  Physical Activity: Insufficiently Active (12/15/2022)   Exercise Vital Sign    Days of Exercise per Week: 3 days    Minutes of Exercise per Session: 30 min  Stress: No Stress Concern Present (12/15/2022)   [employer] of Occupational Health - Occupational Stress Questionnaire    Feeling of Stress : Not at all  Social Connections: Socially Isolated (12/15/2022)   Social Connection and Isolation Panel [NHANES]    Frequency of Communication with Friends and Family: More than three times a week    Frequency of Social Gatherings with Friends and Family: Three times a week    Attends Religious Services: Never    Active Member of Clubs or Organizations: No    Attends Banker Meetings: Never    Marital Status: Never married  Intimate Partner Violence: Not At Risk (12/15/2022)   Humiliation, Afraid, Rape, and Kick questionnaire    Fear of Current or Ex-Partner: No    Emotionally Abused: No    Physically Abused: No    Sexually Abused: No   Outpatient Medications Prior to Visit  Medication Sig Dispense Refill   acetaminophen  (TYLENOL ) 500 MG tablet Take 1,000 mg by mouth daily as needed for mild pain, headache or fever.     amLODipine  (NORVASC ) 5  MG tablet Take 5 mg by mouth daily.     aspirin  EC 81 MG tablet Take 81 mg by mouth daily. Swallow whole.     brompheniramine -pseudoephedrine (DIMETAPP) 1-15 MG/5ML ELIX Take 20 mLs by mouth daily as needed for allergies (Cold).     losartan  (COZAAR ) 25 MG tablet Take 25 mg by mouth daily.     ondansetron  (ZOFRAN -ODT) 4 MG disintegrating tablet Take 1 tablet (4 mg total) by mouth every 8 (eight) hours as needed for nausea or vomiting. 10 tablet 0   predniSONE  (DELTASONE ) 5 MG tablet Take 5 mg by mouth daily.      tacrolimus  (PROGRAF ) 1 MG capsule Take 2 mg by mouth 2 (two) times daily.      gabapentin  (NEURONTIN ) 300 MG capsule TAKE 1 CAPSULE BY MOUTH EVERYDAY AT BEDTIME 60 capsule 0   Cholecalciferol (VITAMIN D3 PO) Take 1 tablet by mouth daily. (Patient not taking: Reported on 05/30/2023)     divalproex  (DEPAKOTE ) 250 MG DR tablet TAKE 1 TABLET BY MOUTH TWICE A DAY (Patient not taking: Reported on 05/30/2023) 180 tablet 1   doxycycline  (VIBRAMYCIN ) 100 MG capsule Take 1 capsule (100 mg total) by mouth 2 (two) times daily. 14 capsule 0   imipramine  (TOFRANIL ) 25 MG tablet Take 50 mg by mouth at bedtime.  (Patient not taking: Reported on 05/30/2023)     oxyCODONE  (OXY IR/ROXICODONE ) 5 MG immediate release tablet Take 1 tablet (5 mg total) by mouth every 6 (six) hours as needed for severe pain (pain score 7-10). (Patient not taking: Reported on 05/30/2023) 8 tablet 0   No facility-administered medications prior to visit.   Allergies  Allergen Reactions   Erythromycin Nausea And Vomiting   Sulfa  Antibiotics Itching and Nausea And Vomiting    ROS: A complete ROS was performed with pertinent positives/negatives noted in the HPI. The remainder of the ROS are negative.   Objective:   Today's Vitals   05/30/23 1353  BP: 113/80  Pulse: (!) 103  SpO2: 98%  Weight: 116 lb 12.8 oz (53 kg)  Height: 6' (1.829 m)  GENERAL: Well-appearing, in NAD. Underweight.   SKIN: Pink, warm and dry. No rash,  lesion, ulceration, or ecchymoses.  Head: Normocephalic. NECK: Trachea midline. Full ROM w/o pain or tenderness.  RESPIRATORY: Chest wall symmetrical. Respirations even and non-labored. Breath sounds clear to auscultation bilaterally.  CARDIAC: S1, S2 present, regular rate and rhythm. Grade II Systolic murmur present.  Peripheral pulses 2+ bilaterally.  MSK: Muscle tone and strength decreased with atrophy present for age. EXTREMITIES: Without clubbing, cyanosis, or edema.  NEUROLOGIC: No motor or sensory deficits. Steady, even gait. C2-C12 intact.  PSYCH/MENTAL STATUS: Alert, oriented x 3. Cooperative, appropriate mood and affect.      Assessment & Plan:  1. Screening for colon cancer (Primary) - Ambulatory referral to Gastroenterology  2. Restless leg syndrome Controlled currently.  Gabapentin  refilled.  Safe use of this medication reviewed with patient - gabapentin  (NEURONTIN ) 300 MG capsule; Take 1 capsule (300 mg total) by mouth at bedtime.  Dispense: 90 capsule; Refill: 3  3. Immunization due - Tdap vaccine greater than or equal to 7yo IM - Flu vaccine trivalent PF, 6mos and older(Flulaval,Afluria,Fluarix,Fluzone)  4. Renal transplant, status post On immunosuppressant tacrolimus  currently.  Labs are performed by nephrology routinely and patient has follow-up with nephrology Wednesday [facility] on June 14, 2023.  5. Essential hypertension Well-controlled, continue on current regimen.  Repeat renal function with nephrology at the end of the month.  6. Aneurysm (HCC) P per chart review patient's last measurement of aneurysm was January 2024 and 4.2 cm with mildly dilated aortic root.  Ascending aorta measured 3.6 cm.  Recommendations are for annual surveillance of aneurysm with cardiovascular provider.  Patient's mother declined referral at this time to [facility] health cardiology to establish care and will speak with [facility] cardiovascular provider to discuss surveillance for this year.   Will reach out to PCP if she changes her mind and needs referral.  7. Underweight (BMI < 18.5) Discussed good sources of caloric intake, managing protein with chronic kidney disease posttransplant and nutrition therapy.  A total of 70 minutes were spent on this encounter today including face to face, ordering and reviewing labs, reviewing patient's previous records from [facility] Nephrology, Pulmonology, and Cardiology, documenting in the record and ordering medications and screenings.    Patient to reach out to office if new, worrisome, or unresolved symptoms arise or if no improvement in patient's condition. Patient verbalized understanding and is agreeable to treatment plan. All questions answered to patient's satisfaction.    Return in about 3 months (around 08/28/2023) for ANNUAL PHYSICAL/Follow up Chronic Conditions .    Nonda Bays, [place]

## 2023-06-13 DIAGNOSIS — N1832 Anemia of chronic renal failure, stage 3b (CMS-HCC): Principal | ICD-10-CM

## 2023-06-13 DIAGNOSIS — E559 Vitamin D deficiency, unspecified: Principal | ICD-10-CM

## 2023-06-13 DIAGNOSIS — Z79899 Other long term (current) drug therapy: Principal | ICD-10-CM

## 2023-06-13 DIAGNOSIS — D631 Anemia in chronic kidney disease: Principal | ICD-10-CM

## 2023-06-13 DIAGNOSIS — Z940 Kidney transplant status: Principal | ICD-10-CM

## 2023-06-14 ENCOUNTER — Inpatient Hospital Stay: Admit: 2023-06-14 | Discharge: 2023-06-14 | Payer: MEDICARE

## 2023-06-14 ENCOUNTER — Ambulatory Visit: Admit: 2023-06-14 | Discharge: 2023-06-14 | Payer: MEDICARE | Attending: Nephrology | Primary: Nephrology

## 2023-06-14 ENCOUNTER — Ambulatory Visit: Admit: 2023-06-14 | Discharge: 2023-06-14 | Payer: MEDICARE

## 2023-06-14 DIAGNOSIS — R809 Proteinuria, unspecified: Secondary | ICD-10-CM | POA: Diagnosis not present

## 2023-06-14 DIAGNOSIS — D631 Anemia in chronic kidney disease: Secondary | ICD-10-CM | POA: Diagnosis not present

## 2023-06-14 DIAGNOSIS — Z4822 Encounter for aftercare following kidney transplant: Secondary | ICD-10-CM | POA: Diagnosis not present

## 2023-06-14 DIAGNOSIS — N1832 Chronic kidney disease, stage 3b: Secondary | ICD-10-CM | POA: Diagnosis not present

## 2023-06-14 DIAGNOSIS — I10 Essential (primary) hypertension: Secondary | ICD-10-CM | POA: Diagnosis not present

## 2023-06-14 DIAGNOSIS — Z79899 Other long term (current) drug therapy: Secondary | ICD-10-CM | POA: Diagnosis not present

## 2023-06-14 DIAGNOSIS — D849 Immunodeficiency, unspecified: Secondary | ICD-10-CM | POA: Diagnosis not present

## 2023-06-14 DIAGNOSIS — Z940 Kidney transplant status: Secondary | ICD-10-CM | POA: Diagnosis not present

## 2023-06-14 DIAGNOSIS — E559 Vitamin D deficiency, unspecified: Secondary | ICD-10-CM | POA: Diagnosis not present

## 2023-06-14 MED ORDER — ONDANSETRON 4 MG DISINTEGRATING TABLET
ORAL_TABLET | Freq: Three times a day (TID) | ORAL | 0 refills | 5.00 days | Status: CP | PRN
Start: 2023-06-14 — End: ?

## 2023-06-14 NOTE — Unmapped (Signed)
[facility] transplant nephrology clinic visit    assessment and plan  1. kidney transplant 02/22/2000. baseline creatinine 2-2.5 mg/dl. +albuminuria. stage iiib chronic kidney disease. no donor specific hla ab detected.  2. immunosuppression. prednisone 5mg  daily. tacrolimus 12hr lvl 5-7 ng/ml.  3. hypertension. blood pressure goal < 130/80 mmhg.  4. albuminuria. angiotensin receptor blockade. sglt2 inh initiation for kidney protective effect recommended.  5. ascending aortic aneurysm 3.7cm. stable chest ct '24. [place] cardiothoracic surgery follow up appt in 2 years recommended.  6. gout. allopurinol 50mg  daily.  7. preventive medicine. influenza '23. pcv13 pneumococcal '17. ppsv23 pneumococcal '22. covid-19 vaccine '23. dermatology skin cancer screening appt strongly encouraged. colonoscopy recommended. kidney ultrasound '22.    history of present illness    Oscar [name] is a 48 year old gentleman seen in follow up post kidney transplant 02/22/2000.    past medical history:  1. living donor kidney transplant 02/22/2000. 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 baseline creatinine 2-2.5 mg/dl.  2. hypertension  3. heart failure with preserved ejection fraction. echocardiogram '24: +moderate-severe lvh. left ventricular ejection fraction > 70%. indeterminate diastolic lv function.  4. seizure disorder hx  5. migraine  6. gout  7. 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     past surgical history: bilateral native nephrectomy d/t renal cell carcinoma '99. left arm av fistula '99. kidney transplant '01. 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 left neck cyst excision '18. left neck and bilateral facial cyst excision '19.     allergies: sulfa. erythromycin     medications: tacrolimus 2mg  bid, prednisone 5mg  daily, losartan 25mg  daily, amlodipine 5mg  daily, divalproex 500mg  daily, gabapentin 300mg  q.pm.    physical exam: t97 p95 bp100/72 wt64kg bmi 19. wd/wn gentleman appropriate affect and mood. sclera anicteric. nmm no thrush. neck supple. heart rrr nl s1s2 +iii/vi systolic murmur. lungs clear bilateral. abd soft nt/nd. no lower ext edema. msk no synovitis or tophi. skin no rash. neuro alert oriented non focal exam.

## 2023-06-14 NOTE — Unmapped (Signed)
I spent 15 minutes with the patient at his clinic visit. I reviewed and updated his medications. He took his tacrolimus at 10pm last night.    Fever: No  Chills: No    Pain: no    Painful urination:No    Nausea: No  Vomiting: No  Diarrhea: No    Chest Pain: No    Tremors: No  Headaches: No  Numbness/Tingling: No    Edema: No  SOB: No  Intake:   Output:    BP readings:   Dizziness/Lightheadedness:No    Other issues/concerns: weight is down to 117 from 139 a year ago. He had some nausea with skin abscess in Dec 2024.  He will work on getting some exercise as well.

## 2023-07-02 LAB — CBC W/ DIFFERENTIAL
BASOPHILS ABSOLUTE COUNT: 0
BASOPHILS RELATIVE PERCENT: 0 %
EOSINOPHILS ABSOLUTE COUNT: 0.3 %
EOSINOPHILS RELATIVE PERCENT: 2 %
HEMATOCRIT: 34.2 — ABNORMAL LOW
HEMOGLOBIN: 11.5 — ABNORMAL LOW
LYMPHOCYTES ABSOLUTE COUNT: 1
LYMPHOCYTES RELATIVE PERCENT: 7 %
MEAN CORPUSCULAR HEMOGLOBIN CONC: 33.6
MEAN CORPUSCULAR HEMOGLOBIN: 29.4
MEAN CORPUSCULAR VOLUME: 87.5
MONOCYTES ABSOLUTE COUNT: 0.9 %
MONOCYTES RELATIVE PERCENT: 7 %
NEUTROPHILS ABSOLUTE COUNT: 11.5 — ABNORMAL HIGH
NEUTROPHILS RELATIVE PERCENT: 84 %
NUCLEATED RED BLOOD CELLS: 0
PLATELET COUNT: 217
RED BLOOD CELL COUNT: 3.91 — ABNORMAL LOW
RED CELL DISTRIBUTION WIDTH: 13.2
WHITE BLOOD CELL COUNT: 13.7 — ABNORMAL HIGH

## 2023-07-02 LAB — COMPREHENSIVE METABOLIC PANEL
ALBUMIN: 3.6
ALKALINE PHOSPHATASE: 119
ALT (SGPT): 11
ANION GAP: 9
AST (SGOT): 8 — ABNORMAL LOW
BILIRUBIN TOTAL: 0.6
BLOOD UREA NITROGEN: 29 — ABNORMAL HIGH
CALCIUM: 9.1
CHLORIDE: 105
CO2: 21 — ABNORMAL LOW
CREATININE: 2.48 — ABNORMAL HIGH
EGFR CKD-EPI NON-AA MALE: 31 — ABNORMAL LOW
GLUCOSE RANDOM: 109 — ABNORMAL HIGH
POTASSIUM: 4.5
PROTEIN TOTAL: 6.9
SODIUM: 135

## 2023-07-02 LAB — CBC
HEMATOCRIT: 31.1 — ABNORMAL LOW
HEMOGLOBIN: 10.5 — ABNORMAL LOW
MEAN CORPUSCULAR HEMOGLOBIN CONC: 33.8
MEAN CORPUSCULAR HEMOGLOBIN: 29.7
MEAN CORPUSCULAR VOLUME: 88.1
NUCLEATED RED BLOOD CELLS: 0
PLATELET COUNT: 178
RED BLOOD CELL COUNT: 3.53 — ABNORMAL LOW
RED CELL DISTRIBUTION WIDTH: 13.2
WHITE BLOOD CELL COUNT: 8.2

## 2023-07-02 LAB — RENAL FUNCTION PANEL
ANION GAP: 9
BLOOD UREA NITROGEN: 32 — ABNORMAL HIGH
CALCIUM: 9.1
CHLORIDE: 107
CO2: 18 — ABNORMAL LOW
CREATININE: 2.76 — ABNORMAL HIGH
EGFR MDRD NON AF AMER: 27 — ABNORMAL LOW
GLUCOSE RANDOM: 104 — ABNORMAL HIGH
POTASSIUM: 5.7 — ABNORMAL HIGH
SODIUM: 134 — ABNORMAL LOW

## 2023-07-02 LAB — URINALYSIS WITH MICROSCOPY
BACTERIA: NONE SEEN
BILIRUBIN UA: NEGATIVE
GLUCOSE UA: NEGATIVE
LEUKOCYTE ESTERASE UA: NEGATIVE
NITRITE UA: NEGATIVE
PH UA: 5.5
PROTEIN UA: 30 — AB
SPECIFIC GRAVITY UA: 1.018

## 2023-07-02 LAB — LIPASE: LIPASE: 27

## 2023-07-06 NOTE — Unmapped (Addendum)
 This pharmacist was notified by a technician that this patient has reported that they've missed 5 doses of their prograf/prednisone.. I have reviewed the patient's medical record and  messaged clinic for info/if they need to followup  Approximate time spent: 0-5 minutes  Oscar [name], PharmD, Clinical Specialty Pharmacist  [facility]          [facility] Refill Coordination Note    Specialty Medication(s) to be Shipped:   Transplant: Prograf 1mg  and Prednisone 5mg     Other medication(s) to be shipped: No additional medications requested for fill at this time     Oscar [name], DOB: 1975-11-29  Phone: [phone] (home)       All above HIPAA information was verified with patient.     Was a Nurse, learning disability used for this call? No    Completed refill call assessment today to schedule patient's medication shipment from the [facility] [phone]).  All relevant notes have been reviewed.     Specialty medication(s) and dose(s) confirmed: Regimen is correct and unchanged.   Changes to medications: Oscar [name] reports no changes at this time.  Changes to insurance: No  New side effects reported not previously addressed with a pharmacist or physician: None reported  Questions for the pharmacist: No    Confirmed patient received a Conservation officer, historic buildings and a Surveyor, mining with first shipment. The patient will receive a drug information handout for each medication shipped and additional FDA Medication Guides as required.       DISEASE/MEDICATION-SPECIFIC INFORMATION        N/A    SPECIALTY MEDICATION ADHERENCE     Medication Adherence    Patient reported X missed doses in the last month: 5  Adherence tools used: patient uses a pill box to manage medications  Support network for adherence: family member              Were doses missed due to medication being on hold? No    prednisone 5 mg: 7.5 days of medicine on hand   Prograf 1 mg: 7.5 days of medicine on hand     REFERRAL TO PHARMACIST     Referral to the pharmacist: Not needed      [facility]     Shipping address confirmed in Epic.       Delivery Scheduled: Yes, Expected medication delivery date: 07/11/23.     Medication will be delivered via UPS to the prescription address in Epic WAM.    Oscar [name]   [facility] Specialty Technician

## 2023-07-26 ENCOUNTER — Encounter: Payer: Self-pay | Admitting: Family Medicine

## 2023-07-29 ENCOUNTER — Other Ambulatory Visit: Payer: Self-pay

## 2023-07-29 ENCOUNTER — Encounter (HOSPITAL_BASED_OUTPATIENT_CLINIC_OR_DEPARTMENT_OTHER): Payer: Self-pay

## 2023-07-29 ENCOUNTER — Emergency Department (HOSPITAL_BASED_OUTPATIENT_CLINIC_OR_DEPARTMENT_OTHER)
Admission: EM | Admit: 2023-07-29 | Discharge: 2023-07-29 | Disposition: A | Discharge status: Home / Self Care | Source: Home / Self Care | Attending: Emergency Medicine | Admitting: Emergency Medicine

## 2023-07-29 DIAGNOSIS — Z79899 Other long term (current) drug therapy: Secondary | ICD-10-CM | POA: Diagnosis not present

## 2023-07-29 DIAGNOSIS — Z7982 Long term (current) use of aspirin: Secondary | ICD-10-CM | POA: Diagnosis not present

## 2023-07-29 DIAGNOSIS — I714 Abdominal aortic aneurysm, without rupture, unspecified: Secondary | ICD-10-CM | POA: Diagnosis not present

## 2023-07-29 DIAGNOSIS — N2581 Secondary hyperparathyroidism of renal origin: Secondary | ICD-10-CM | POA: Diagnosis not present

## 2023-07-29 DIAGNOSIS — J101 Influenza due to other identified influenza virus with other respiratory manifestations: Secondary | ICD-10-CM

## 2023-07-29 DIAGNOSIS — N179 Acute kidney failure, unspecified: Secondary | ICD-10-CM | POA: Diagnosis not present

## 2023-07-29 DIAGNOSIS — Z888 Allergy status to other drugs, medicaments and biological substances status: Secondary | ICD-10-CM | POA: Diagnosis not present

## 2023-07-29 DIAGNOSIS — T8619 Other complication of kidney transplant: Secondary | ICD-10-CM | POA: Diagnosis not present

## 2023-07-29 DIAGNOSIS — K219 Gastro-esophageal reflux disease without esophagitis: Secondary | ICD-10-CM | POA: Diagnosis not present

## 2023-07-29 DIAGNOSIS — E875 Hyperkalemia: Secondary | ICD-10-CM | POA: Diagnosis not present

## 2023-07-29 DIAGNOSIS — Z882 Allergy status to sulfonamides status: Secondary | ICD-10-CM | POA: Diagnosis not present

## 2023-07-29 DIAGNOSIS — G2581 Restless legs syndrome: Secondary | ICD-10-CM | POA: Diagnosis not present

## 2023-07-29 DIAGNOSIS — Z79621 Long term (current) use of calcineurin inhibitor: Secondary | ICD-10-CM | POA: Diagnosis not present

## 2023-07-29 DIAGNOSIS — D631 Anemia in chronic kidney disease: Secondary | ICD-10-CM | POA: Diagnosis not present

## 2023-07-29 DIAGNOSIS — Z825 Family history of asthma and other chronic lower respiratory diseases: Secondary | ICD-10-CM | POA: Diagnosis not present

## 2023-07-29 DIAGNOSIS — N1832 Chronic kidney disease, stage 3b: Secondary | ICD-10-CM | POA: Diagnosis not present

## 2023-07-29 DIAGNOSIS — Z905 Acquired absence of kidney: Secondary | ICD-10-CM | POA: Diagnosis not present

## 2023-07-29 DIAGNOSIS — Z7952 Long term (current) use of systemic steroids: Secondary | ICD-10-CM | POA: Diagnosis not present

## 2023-07-29 DIAGNOSIS — I129 Hypertensive chronic kidney disease with stage 1 through stage 4 chronic kidney disease, or unspecified chronic kidney disease: Secondary | ICD-10-CM | POA: Diagnosis not present

## 2023-07-29 DIAGNOSIS — Z1152 Encounter for screening for COVID-19: Secondary | ICD-10-CM | POA: Diagnosis not present

## 2023-07-29 DIAGNOSIS — Z8249 Family history of ischemic heart disease and other diseases of the circulatory system: Secondary | ICD-10-CM | POA: Diagnosis not present

## 2023-07-29 LAB — RESP PANEL BY RT-PCR (RSV, FLU A&B, COVID)  RVPGX2
Influenza A by PCR: POSITIVE — AB
Influenza B by PCR: NEGATIVE
Resp Syncytial Virus by PCR: NEGATIVE
SARS Coronavirus 2 by RT PCR: NEGATIVE

## 2023-07-29 MED ORDER — ONDANSETRON 4 MG PO TBDP: 4.0000 mg | ORAL_TABLET | Freq: Three times a day (TID) | ORAL | 0 refills | Status: DC | PRN

## 2023-07-29 MED ORDER — ACETAMINOPHEN 500 MG PO TABS
1000.0000 mg | ORAL_TABLET | Freq: Once | ORAL | Status: AC
  Administered 2023-07-29: 1000 mg via ORAL
  Filled 2023-07-29: qty 2

## 2023-07-29 MED ORDER — ONDANSETRON 4 MG PO TBDP
4.0000 mg | ORAL_TABLET | Freq: Once | ORAL | Status: AC | PRN
  Administered 2023-07-29: 4 mg via ORAL
  Filled 2023-07-29: qty 1

## 2023-07-29 MED ORDER — ONDANSETRON 4 MG PO TBDP
4.0000 mg | ORAL_TABLET | Freq: Once | ORAL | Status: AC
  Administered 2023-07-29: 4 mg via ORAL
  Filled 2023-07-29: qty 1

## 2023-07-29 NOTE — ED Provider Notes (Signed)
 [place] EMERGENCY DEPARTMENT AT [facility] Provider Note   CSN: [id number] Arrival date & time: 07/29/23  1527     History  Chief Complaint  Patient presents with   Emesis   Nasal Congestion    Tyler Alvarez is a 48 y.o. male.  Past medical history significant for CKD and sepsis presents today for URI symptoms that began last night.  Patient also endorses fatigue, nausea, vomiting, cough, congestion, and sore throat.  Patient denies diarrhea, chest pain, shortness of breath, body aches, or abdominal pain.   Emesis Associated symptoms: cough, fever and sore throat        Home Medications Prior to Admission medications   Medication Sig Start Date End Date Taking? Authorizing Provider  ondansetron (ZOFRAN-ODT) 4 MG disintegrating tablet Take 1 tablet (4 mg total) by mouth every 8 (eight) hours as needed for nausea or vomiting. 07/29/23  Yes Dolphus Jenny, PA-C  acetaminophen (TYLENOL) 500 MG tablet Take 1,000 mg by mouth daily as needed for mild pain, headache or fever.    [provider]  amLODipine (NORVASC) 5 MG tablet Take 5 mg by mouth daily. 05/19/16   [provider]  aspirin EC 81 MG tablet Take 81 mg by mouth daily. Swallow whole.    [provider]  brompheniramine-pseudoephedrine (DIMETAPP) 1-15 MG/5ML ELIX Take 20 mLs by mouth daily as needed for allergies (Cold).    [provider]  gabapentin (NEURONTIN) 300 MG capsule Take 1 capsule (300 mg total) by mouth at bedtime. 05/30/23   Caudle, [name], FNP  losartan (COZAAR) 25 MG tablet Take 25 mg by mouth daily. 03/31/21   [provider]  predniSONE (DELTASONE) 5 MG tablet Take 5 mg by mouth daily.     [provider]  tacrolimus (PROGRAF) 1 MG capsule Take 2 mg by mouth 2 (two) times daily.     [provider]      Allergies    Erythromycin and Sulfa antibiotics    Review of Systems   Review of Systems  Constitutional:  Positive for  fatigue and fever.  HENT:  Positive for congestion and sore throat.   Respiratory:  Positive for cough.   Gastrointestinal:  Positive for nausea and vomiting.    Physical Exam Updated Vital Signs BP (!) 166/90   Pulse (!) 107   Temp 100.1 F (37.8 C) (Oral)   Resp 19   Ht 6' (1.829 m)   Wt 53.5 kg   SpO2 96%   BMI 16.00 kg/m  Physical Exam Vitals and nursing note reviewed.  Constitutional:      General: He is not in acute distress.    Appearance: He is well-developed and underweight. He is ill-appearing. He is not toxic-appearing or diaphoretic.  HENT:     Head: Normocephalic and atraumatic.     Right Ear: External ear normal.     Left Ear: External ear normal.     Nose: Congestion present.     Mouth/Throat:     Mouth: Mucous membranes are moist.     Pharynx: Oropharynx is clear. No oropharyngeal exudate or posterior oropharyngeal erythema.  Eyes:     Conjunctiva/sclera: Conjunctivae normal.  Cardiovascular:     Rate and Rhythm: Regular rhythm. Tachycardia present.     Pulses: Normal pulses.     Heart sounds: Normal heart sounds. No murmur heard.    Comments: Mild tachycardia on exam Pulmonary:     Effort: Pulmonary effort is normal. No respiratory  distress.     Breath sounds: Normal breath sounds.  Abdominal:     Palpations: Abdomen is soft.  Musculoskeletal:        General: No swelling.     Cervical back: Neck supple.  Skin:    General: Skin is warm and dry.     Capillary Refill: Capillary refill takes less than 2 seconds.  Neurological:     General: No focal deficit present.     Mental Status: He is alert.     Motor: No weakness.  Psychiatric:        Mood and Affect: Mood normal.     ED Results / Procedures / Treatments   Labs (all labs ordered are listed, but only abnormal results are displayed) Labs Reviewed  RESP PANEL BY RT-PCR (RSV, FLU A&B, COVID)  RVPGX2 - Abnormal; Notable for the following components:      Result Value   Influenza A by PCR  POSITIVE (*)    All other components within normal limits  LIPASE, BLOOD  COMPREHENSIVE METABOLIC PANEL  CBC  URINALYSIS, ROUTINE W REFLEX MICROSCOPIC    EKG None  Radiology No results found.  Procedures Procedures    Medications Ordered in ED Medications  ondansetron (ZOFRAN-ODT) disintegrating tablet 4 mg (4 mg Oral Given 07/29/23 1748)  acetaminophen (TYLENOL) tablet 1,000 mg (1,000 mg Oral Given 07/29/23 1634)  ondansetron (ZOFRAN-ODT) disintegrating tablet 4 mg (4 mg Oral Given 07/29/23 1635)    ED Course/ Medical Decision Making/ A&P                                 Medical Decision Making Amount and/or Complexity of Data Reviewed Labs: ordered.  Risk Prescription drug management.   This patient presents to the ED with chief complaint(s) of URI symptoms and emesis with pertinent past medical history of CKD and sepsis which further complicates the presenting complaint. The complaint involves an extensive differential diagnosis and also carries with it a high risk of complications and morbidity.    The differential diagnosis includes COVID, flu, RSV, pancreatitis, appendicitis, gastroenteritis  Additional history obtained: Records reviewed Primary Care Documents  ED Course and Reassessment: Patient refused ultrasound IV and could not provide urine  Independent labs interpretation:  The following labs were independently interpreted:  Respiratory panel: Influenza A positive  Consultation: - Consulted or discussed management/test interpretation w/ external professional: None  Consideration for admission or further workup: Considered for mission further workup however patient refused blood work and could not provide urine.  Patient's vital signs, physical exam, and respiratory panel were reassuring.  Patient's symptoms most likely caused by acute influenza A infection.  Patient prescribed Zofran as needed for nausea patient advised to take Tylenol/Motrin as needed  for fever, Flonase as needed for nasal congestion, and plain Mucinex as needed for chest congestion. Patient should follow-up with their primary care in the upcoming week if their symptoms persist for further evaluation and workup.         Final Clinical Impression(s) / ED Diagnoses Final diagnoses:  Influenza A    Rx / DC Orders ED Discharge Orders          Ordered    ondansetron (ZOFRAN-ODT) 4 MG disintegrating tablet  Every 8 hours PRN        07/29/23 1744              Dolphus Jenny, PA-C 07/29/23 1755  Arby Barrette, MD 08/02/23 [phone]

## 2023-07-29 NOTE — Discharge Instructions (Addendum)
 Today you were seen for an influenza A infection.  You may alternate taking Tylenol and Motrin as needed for fever and pain, Flonase as needed for nasal congestion, and plain Mucinex as needed for chest congestion.  You may return to work/school prior to the date listed on your excuse if you are fever free without Tylenol or Motrin for 24 hours and your symptoms are improving.  Thank you for letting us treat you today. After reviewing your labs and imaging, I feel you are safe to go home. Please follow up with your PCP in the next several days and provide them with your records from this visit. Return to the Emergency Room if pain becomes severe or symptoms worsen.

## 2023-07-29 NOTE — ED Triage Notes (Signed)
 Pt POV reporting URI sx that began last night followed by multiple episodes of emesis today, unable to keep food or meds down, endorses fatigue.

## 2023-07-30 ENCOUNTER — Observation Stay (HOSPITAL_COMMUNITY)

## 2023-07-30 ENCOUNTER — Encounter (HOSPITAL_BASED_OUTPATIENT_CLINIC_OR_DEPARTMENT_OTHER): Payer: Self-pay | Admitting: Emergency Medicine

## 2023-07-30 ENCOUNTER — Inpatient Hospital Stay (HOSPITAL_BASED_OUTPATIENT_CLINIC_OR_DEPARTMENT_OTHER)
Admission: EM | Admit: 2023-07-30 | Discharge: 2023-08-02 | DRG: 194 | Disposition: A | Discharge status: Home / Self Care | Attending: Internal Medicine | Admitting: Internal Medicine

## 2023-07-30 ENCOUNTER — Emergency Department (HOSPITAL_BASED_OUTPATIENT_CLINIC_OR_DEPARTMENT_OTHER)

## 2023-07-30 ENCOUNTER — Other Ambulatory Visit: Payer: Self-pay

## 2023-07-30 DIAGNOSIS — Y830 Surgical operation with transplant of whole organ as the cause of abnormal reaction of the patient, or of later complication, without mention of misadventure at the time of the procedure: Secondary | ICD-10-CM | POA: Diagnosis present

## 2023-07-30 DIAGNOSIS — I714 Abdominal aortic aneurysm, without rupture, unspecified: Secondary | ICD-10-CM | POA: Diagnosis present

## 2023-07-30 DIAGNOSIS — K8020 Calculus of gallbladder without cholecystitis without obstruction: Secondary | ICD-10-CM | POA: Diagnosis not present

## 2023-07-30 DIAGNOSIS — R110 Nausea: Secondary | ICD-10-CM | POA: Diagnosis not present

## 2023-07-30 DIAGNOSIS — D631 Anemia in chronic kidney disease: Secondary | ICD-10-CM | POA: Diagnosis present

## 2023-07-30 DIAGNOSIS — R112 Nausea with vomiting, unspecified: Secondary | ICD-10-CM | POA: Diagnosis present

## 2023-07-30 DIAGNOSIS — Z79621 Long term (current) use of calcineurin inhibitor: Secondary | ICD-10-CM

## 2023-07-30 DIAGNOSIS — G40909 Epilepsy, unspecified, not intractable, without status epilepticus: Secondary | ICD-10-CM | POA: Diagnosis present

## 2023-07-30 DIAGNOSIS — Z7952 Long term (current) use of systemic steroids: Secondary | ICD-10-CM

## 2023-07-30 DIAGNOSIS — Z7982 Long term (current) use of aspirin: Secondary | ICD-10-CM | POA: Diagnosis not present

## 2023-07-30 DIAGNOSIS — K219 Gastro-esophageal reflux disease without esophagitis: Secondary | ICD-10-CM | POA: Diagnosis present

## 2023-07-30 DIAGNOSIS — Z905 Acquired absence of kidney: Secondary | ICD-10-CM

## 2023-07-30 DIAGNOSIS — N2581 Secondary hyperparathyroidism of renal origin: Secondary | ICD-10-CM | POA: Diagnosis present

## 2023-07-30 DIAGNOSIS — Z743 Need for continuous supervision: Secondary | ICD-10-CM | POA: Diagnosis not present

## 2023-07-30 DIAGNOSIS — Z888 Allergy status to other drugs, medicaments and biological substances status: Secondary | ICD-10-CM

## 2023-07-30 DIAGNOSIS — Z1152 Encounter for screening for COVID-19: Secondary | ICD-10-CM

## 2023-07-30 DIAGNOSIS — I129 Hypertensive chronic kidney disease with stage 1 through stage 4 chronic kidney disease, or unspecified chronic kidney disease: Secondary | ICD-10-CM | POA: Diagnosis present

## 2023-07-30 DIAGNOSIS — Z8249 Family history of ischemic heart disease and other diseases of the circulatory system: Secondary | ICD-10-CM | POA: Diagnosis not present

## 2023-07-30 DIAGNOSIS — E875 Hyperkalemia: Secondary | ICD-10-CM | POA: Diagnosis present

## 2023-07-30 DIAGNOSIS — F419 Anxiety disorder, unspecified: Secondary | ICD-10-CM | POA: Diagnosis present

## 2023-07-30 DIAGNOSIS — G2581 Restless legs syndrome: Secondary | ICD-10-CM | POA: Diagnosis present

## 2023-07-30 DIAGNOSIS — R6889 Other general symptoms and signs: Secondary | ICD-10-CM | POA: Diagnosis not present

## 2023-07-30 DIAGNOSIS — R109 Unspecified abdominal pain: Secondary | ICD-10-CM | POA: Diagnosis not present

## 2023-07-30 DIAGNOSIS — Z825 Family history of asthma and other chronic lower respiratory diseases: Secondary | ICD-10-CM | POA: Diagnosis not present

## 2023-07-30 DIAGNOSIS — J101 Influenza due to other identified influenza virus with other respiratory manifestations: Secondary | ICD-10-CM

## 2023-07-30 DIAGNOSIS — T8619 Other complication of kidney transplant: Secondary | ICD-10-CM | POA: Diagnosis present

## 2023-07-30 DIAGNOSIS — D649 Anemia, unspecified: Secondary | ICD-10-CM | POA: Diagnosis not present

## 2023-07-30 DIAGNOSIS — N1832 Chronic kidney disease, stage 3b: Secondary | ICD-10-CM | POA: Diagnosis present

## 2023-07-30 DIAGNOSIS — R0902 Hypoxemia: Secondary | ICD-10-CM | POA: Diagnosis present

## 2023-07-30 DIAGNOSIS — Z79899 Other long term (current) drug therapy: Secondary | ICD-10-CM | POA: Diagnosis not present

## 2023-07-30 DIAGNOSIS — N179 Acute kidney failure, unspecified: Principal | ICD-10-CM

## 2023-07-30 DIAGNOSIS — Z882 Allergy status to sulfonamides status: Secondary | ICD-10-CM

## 2023-07-30 DIAGNOSIS — I700 Atherosclerosis of aorta: Secondary | ICD-10-CM | POA: Diagnosis not present

## 2023-07-30 HISTORY — DX: Influenza due to other identified influenza virus with other respiratory manifestations: J10.1

## 2023-07-30 HISTORY — DX: Nausea with vomiting, unspecified: R11.2

## 2023-07-30 LAB — PHOSPHORUS: Phosphorus: 3.9 mg/dL (ref 2.5–4.6)

## 2023-07-30 LAB — CBC WITH DIFFERENTIAL/PLATELET
Abs Immature Granulocytes: 0.05 10*3/uL (ref 0.00–0.07)
Basophils Absolute: 0.1 10*3/uL (ref 0.0–0.1)
Basophils Relative: 1 %
Eosinophils Absolute: 0 10*3/uL (ref 0.0–0.5)
Eosinophils Relative: 0 %
HCT: 36.2 % — ABNORMAL LOW (ref 39.0–52.0)
Hemoglobin: 12.1 g/dL — ABNORMAL LOW (ref 13.0–17.0)
Immature Granulocytes: 1 %
Lymphocytes Relative: 6 %
Lymphs Abs: 0.7 10*3/uL (ref 0.7–4.0)
MCH: 29.7 pg (ref 26.0–34.0)
MCHC: 33.4 g/dL (ref 30.0–36.0)
MCV: 88.7 fL (ref 80.0–100.0)
Monocytes Absolute: 0.9 10*3/uL (ref 0.1–1.0)
Monocytes Relative: 8 %
Neutro Abs: 9.4 10*3/uL — ABNORMAL HIGH (ref 1.7–7.7)
Neutrophils Relative %: 84 %
Platelets: 147 10*3/uL — ABNORMAL LOW (ref 150–400)
RBC: 4.08 MIL/uL — ABNORMAL LOW (ref 4.22–5.81)
RDW: 13.6 % (ref 11.5–15.5)
WBC: 11.1 10*3/uL — ABNORMAL HIGH (ref 4.0–10.5)
nRBC: 0 % (ref 0.0–0.2)

## 2023-07-30 LAB — COMPREHENSIVE METABOLIC PANEL
ALT: 22 U/L (ref 0–44)
AST: 28 U/L (ref 15–41)
Albumin: 3.1 g/dL — ABNORMAL LOW (ref 3.5–5.0)
Alkaline Phosphatase: 85 U/L (ref 38–126)
Anion gap: 9 (ref 5–15)
BUN: 31 mg/dL — ABNORMAL HIGH (ref 6–20)
CO2: 20 mmol/L — ABNORMAL LOW (ref 22–32)
Calcium: 8 mg/dL — ABNORMAL LOW (ref 8.9–10.3)
Chloride: 110 mmol/L (ref 98–111)
Creatinine, Ser: 2.91 mg/dL — ABNORMAL HIGH (ref 0.61–1.24)
GFR, Estimated: 26 mL/min — ABNORMAL LOW (ref >60)
Glucose, Bld: 103 mg/dL — ABNORMAL HIGH (ref 70–99)
Potassium: 4.6 mmol/L (ref 3.5–5.1)
Sodium: 139 mmol/L (ref 135–145)
Total Bilirubin: 0.7 mg/dL (ref 0.0–1.2)
Total Protein: 6.2 g/dL — ABNORMAL LOW (ref 6.5–8.1)

## 2023-07-30 LAB — CBC
HCT: 34.9 % — ABNORMAL LOW (ref 39.0–52.0)
Hemoglobin: 11.4 g/dL — ABNORMAL LOW (ref 13.0–17.0)
MCH: 29.5 pg (ref 26.0–34.0)
MCHC: 32.7 g/dL (ref 30.0–36.0)
MCV: 90.2 fL (ref 80.0–100.0)
Platelets: 140 10*3/uL — ABNORMAL LOW (ref 150–400)
RBC: 3.87 MIL/uL — ABNORMAL LOW (ref 4.22–5.81)
RDW: 13.8 % (ref 11.5–15.5)
WBC: 6.3 10*3/uL (ref 4.0–10.5)
nRBC: 0 % (ref 0.0–0.2)

## 2023-07-30 LAB — HEPATIC FUNCTION PANEL
ALT: 19 U/L (ref 0–44)
AST: 31 U/L (ref 15–41)
Albumin: 4.4 g/dL (ref 3.5–5.0)
Alkaline Phosphatase: 95 U/L (ref 38–126)
Bilirubin, Direct: 0.1 mg/dL (ref 0.0–0.2)
Indirect Bilirubin: 0.6 mg/dL (ref 0.3–0.9)
Total Bilirubin: 0.7 mg/dL (ref 0.0–1.2)
Total Protein: 7.6 g/dL (ref 6.5–8.1)

## 2023-07-30 LAB — BASIC METABOLIC PANEL
Anion gap: 11 (ref 5–15)
BUN: 38 mg/dL — ABNORMAL HIGH (ref 6–20)
CO2: 20 mmol/L — ABNORMAL LOW (ref 22–32)
Calcium: 9.4 mg/dL (ref 8.9–10.3)
Chloride: 104 mmol/L (ref 98–111)
Creatinine, Ser: 3.18 mg/dL — ABNORMAL HIGH (ref 0.61–1.24)
GFR, Estimated: 23 mL/min — ABNORMAL LOW (ref >60)
Glucose, Bld: 96 mg/dL (ref 70–99)
Potassium: 5.5 mmol/L — ABNORMAL HIGH (ref 3.5–5.1)
Sodium: 135 mmol/L (ref 135–145)

## 2023-07-30 LAB — URINALYSIS, ROUTINE W REFLEX MICROSCOPIC
Bacteria, UA: NONE SEEN
Bilirubin Urine: NEGATIVE
Glucose, UA: NEGATIVE mg/dL
Hgb urine dipstick: NEGATIVE
Leukocytes,Ua: NEGATIVE
Nitrite: NEGATIVE
Protein, ur: 100 mg/dL — AB
Specific Gravity, Urine: 1.014 (ref 1.005–1.030)
pH: 6.5 (ref 5.0–8.0)

## 2023-07-30 LAB — PROCALCITONIN: Procalcitonin: 2.02 ng/mL

## 2023-07-30 LAB — BRAIN NATRIURETIC PEPTIDE: B Natriuretic Peptide: 473.9 pg/mL — ABNORMAL HIGH (ref 0.0–100.0)

## 2023-07-30 LAB — TSH: TSH: 0.686 u[IU]/mL (ref 0.350–4.500)

## 2023-07-30 LAB — MAGNESIUM: Magnesium: 1.6 mg/dL — ABNORMAL LOW (ref 1.7–2.4)

## 2023-07-30 LAB — HEMOGLOBIN A1C
Hgb A1c MFr Bld: 5.2 % (ref 4.8–5.6)
Mean Plasma Glucose: 102.54 mg/dL

## 2023-07-30 LAB — SEDIMENTATION RATE: Sed Rate: 11 mm/h (ref 0–16)

## 2023-07-30 LAB — HIV ANTIBODY (ROUTINE TESTING W REFLEX): HIV Screen 4th Generation wRfx: NONREACTIVE

## 2023-07-30 LAB — LIPASE, BLOOD: Lipase: 35 U/L (ref 11–51)

## 2023-07-30 MED ORDER — ONDANSETRON HCL 4 MG/2ML IJ SOLN
4.0000 mg | Freq: Four times a day (QID) | INTRAMUSCULAR | Status: DC | PRN
  Administered 2023-07-30 – 2023-08-02 (×6): 4 mg via INTRAVENOUS
  Filled 2023-07-30 (×6): qty 2

## 2023-07-30 MED ORDER — TACROLIMUS 1 MG PO CAPS
2.0000 mg | ORAL_CAPSULE | Freq: Two times a day (BID) | ORAL | Status: DC
  Administered 2023-07-30 – 2023-08-02 (×6): 2 mg via ORAL
  Filled 2023-07-30 (×6): qty 2

## 2023-07-30 MED ORDER — ACETAMINOPHEN 500 MG PO TABS
1000.0000 mg | ORAL_TABLET | Freq: Once | ORAL | Status: AC
  Administered 2023-07-30: 1000 mg via ORAL
  Filled 2023-07-30: qty 2

## 2023-07-30 MED ORDER — ACETAMINOPHEN 500 MG PO TABS
1000.0000 mg | ORAL_TABLET | Freq: Four times a day (QID) | ORAL | Status: AC | PRN
  Administered 2023-07-30: 1000 mg via ORAL
  Filled 2023-07-30: qty 2

## 2023-07-30 MED ORDER — PREDNISONE 5 MG PO TABS
5.0000 mg | ORAL_TABLET | Freq: Every day | ORAL | Status: DC
  Administered 2023-07-30 – 2023-08-02 (×4): 5 mg via ORAL
  Filled 2023-07-30 (×4): qty 1

## 2023-07-30 MED ORDER — PROMETHAZINE HCL 25 MG/ML IJ SOLN
INTRAMUSCULAR | Status: AC
Start: 2023-07-30 — End: 2023-07-30
  Filled 2023-07-30: qty 1

## 2023-07-30 MED ORDER — SODIUM CHLORIDE 0.9 % IV SOLN: INTRAVENOUS | Status: AC

## 2023-07-30 MED ORDER — ACETAMINOPHEN 500 MG PO TABS: 1000.0000 mg | ORAL_TABLET | Freq: Every day | ORAL | Status: DC | PRN

## 2023-07-30 MED ORDER — ALBUTEROL SULFATE (2.5 MG/3ML) 0.083% IN NEBU: 2.5000 mg | INHALATION_SOLUTION | RESPIRATORY_TRACT | Status: DC | PRN

## 2023-07-30 MED ORDER — HEPARIN SODIUM (PORCINE) 5000 UNIT/ML IJ SOLN
5000.0000 [IU] | Freq: Three times a day (TID) | INTRAMUSCULAR | Status: DC
  Administered 2023-07-30 – 2023-08-02 (×8): 5000 [IU] via SUBCUTANEOUS
  Filled 2023-07-30 (×8): qty 1

## 2023-07-30 MED ORDER — GUAIFENESIN ER 600 MG PO TB12
600.0000 mg | ORAL_TABLET | Freq: Two times a day (BID) | ORAL | Status: DC
  Administered 2023-07-31 – 2023-08-02 (×4): 600 mg via ORAL
  Filled 2023-07-30 (×6): qty 1

## 2023-07-30 MED ORDER — SODIUM CHLORIDE 0.9 % IV BOLUS
1000.0000 mL | Freq: Once | INTRAVENOUS | Status: AC
  Administered 2023-07-30: 1000 mL via INTRAVENOUS

## 2023-07-30 MED ORDER — OSELTAMIVIR PHOSPHATE 75 MG PO CAPS
75.0000 mg | ORAL_CAPSULE | Freq: Two times a day (BID) | ORAL | Status: DC
  Administered 2023-07-30: 75 mg via ORAL
  Filled 2023-07-30 (×2): qty 1

## 2023-07-30 MED ORDER — OSELTAMIVIR PHOSPHATE 30 MG PO CAPS
30.0000 mg | ORAL_CAPSULE | Freq: Every day | ORAL | Status: DC
  Administered 2023-07-31 – 2023-08-02 (×3): 30 mg via ORAL
  Filled 2023-07-30 (×3): qty 1

## 2023-07-30 MED ORDER — SODIUM ZIRCONIUM CYCLOSILICATE 10 G PO PACK
10.0000 g | PACK | Freq: Once | ORAL | Status: AC
  Administered 2023-07-30: 10 g via ORAL
  Filled 2023-07-30: qty 1

## 2023-07-30 MED ORDER — SODIUM CHLORIDE 0.9 % IV BOLUS
500.0000 mL | Freq: Once | INTRAVENOUS | Status: AC
  Administered 2023-07-30: 500 mL via INTRAVENOUS

## 2023-07-30 MED ORDER — SODIUM CHLORIDE 0.9 % IV SOLN: Freq: Once | INTRAVENOUS | Status: AC

## 2023-07-30 MED ORDER — AMLODIPINE BESYLATE 5 MG PO TABS
5.0000 mg | ORAL_TABLET | Freq: Every day | ORAL | Status: DC
  Administered 2023-07-30 – 2023-08-02 (×4): 5 mg via ORAL
  Filled 2023-07-30 (×4): qty 1

## 2023-07-30 MED ORDER — SODIUM CHLORIDE 0.9 % IV SOLN
12.5000 mg | Freq: Once | INTRAVENOUS | Status: AC
  Administered 2023-07-30: 12.5 mg via INTRAVENOUS
  Filled 2023-07-30: qty 0.5

## 2023-07-30 MED ORDER — ASPIRIN 81 MG PO TBEC
81.0000 mg | DELAYED_RELEASE_TABLET | Freq: Every day | ORAL | Status: DC
  Administered 2023-07-30 – 2023-08-01 (×2): 81 mg via ORAL
  Filled 2023-07-30 (×4): qty 1

## 2023-07-30 NOTE — ED Notes (Signed)
 This RN unable to obtain IV access; another RN to assess & attempt

## 2023-07-30 NOTE — ED Provider Notes (Addendum)
 [place] EMERGENCY DEPARTMENT AT [facility] Provider Note   CSN: [id number] Arrival date & time: 07/30/23  6213     History  Chief Complaint  Patient presents with   Emesis   Abdominal Pain    Tyler Alvarez is a 48 y.o. male.  Patient here with concern for dehydration.  Nausea vomiting diagnosed with flu a yesterday.  History of chronic kidney disease status post transplant.  Has not been able to hold fluids down at home despite Zofran.  Has some upper abdominal cramping at times.  Continues with fever.  Denies any diarrhea.  Denies any chest pain cough sputum production.  Nothing makes it better nothing makes it worse.  History of high blood pressure as well.  Renal transplant about 20 years ago.  The history is provided by the patient.       Home Medications Prior to Admission medications   Medication Sig Start Date End Date Taking? Authorizing Provider  acetaminophen (TYLENOL) 500 MG tablet Take 1,000 mg by mouth daily as needed for mild pain, headache or fever.    [provider]  amLODipine (NORVASC) 5 MG tablet Take 5 mg by mouth daily. 05/19/16   [provider]  aspirin EC 81 MG tablet Take 81 mg by mouth daily. Swallow whole.    [provider]  brompheniramine-pseudoephedrine (DIMETAPP) 1-15 MG/5ML ELIX Take 20 mLs by mouth daily as needed for allergies (Cold).    [provider]  gabapentin (NEURONTIN) 300 MG capsule Take 1 capsule (300 mg total) by mouth at bedtime. 05/30/23   Caudle, [name], FNP  losartan (COZAAR) 25 MG tablet Take 25 mg by mouth daily. 03/31/21   [provider]  ondansetron (ZOFRAN-ODT) 4 MG disintegrating tablet Take 1 tablet (4 mg total) by mouth every 8 (eight) hours as needed for nausea or vomiting. 07/29/23   Dolphus Jenny, PA-C  predniSONE (DELTASONE) 5 MG tablet Take 5 mg by mouth daily.     [provider]  tacrolimus (PROGRAF) 1 MG capsule Take 2 mg by mouth 2 (two)  times daily.     [provider]      Allergies    Erythromycin and Sulfa antibiotics    Review of Systems   Review of Systems  Physical Exam Updated Vital Signs BP 129/76   Pulse 92   Temp (!) 101.3 F (38.5 C) (Oral)   Resp 18   Ht 6' (1.829 m)   Wt 53.5 kg   SpO2 94%   BMI 16.00 kg/m  Physical Exam Vitals and nursing note reviewed.  Constitutional:      General: He is not in acute distress.    Appearance: He is well-developed. He is not ill-appearing.  HENT:     Head: Normocephalic and atraumatic.     Mouth/Throat:     Mouth: Mucous membranes are moist.  Eyes:     Extraocular Movements: Extraocular movements intact.     Conjunctiva/sclera: Conjunctivae normal.  Cardiovascular:     Rate and Rhythm: Normal rate and regular rhythm.     Heart sounds: No murmur heard. Pulmonary:     Effort: Pulmonary effort is normal. No respiratory distress.     Breath sounds: Normal breath sounds.  Abdominal:     General: Abdomen is flat.     Palpations: Abdomen is soft.     Tenderness: There is no abdominal tenderness.  Musculoskeletal:        General: No swelling.  Cervical back: Neck supple.  Skin:    General: Skin is warm and dry.     Capillary Refill: Capillary refill takes less than 2 seconds.  Neurological:     Mental Status: He is alert.  Psychiatric:        Mood and Affect: Mood normal.     ED Results / Procedures / Treatments   Labs (all labs ordered are listed, but only abnormal results are displayed) Labs Reviewed  CBC WITH DIFFERENTIAL/PLATELET - Abnormal; Notable for the following components:      Result Value   WBC 11.1 (*)    RBC 4.08 (*)    Hemoglobin 12.1 (*)    HCT 36.2 (*)    Platelets 147 (*)    Neutro Abs 9.4 (*)    All other components within normal limits  BASIC METABOLIC PANEL - Abnormal; Notable for the following components:   Potassium 5.5 (*)    CO2 20 (*)    BUN 38 (*)    Creatinine, Ser 3.18 (*)    GFR, Estimated 23  (*)    All other components within normal limits  HEPATIC FUNCTION PANEL  LIPASE, BLOOD  URINALYSIS, ROUTINE W REFLEX MICROSCOPIC    EKG None  Radiology CT ABDOMEN PELVIS WO CONTRAST Result Date: 07/30/2023 CLINICAL DATA:  Abdominal pain, acute, nonlocalized. Nausea, vomiting and abdominal pain. EXAM: CT ABDOMEN AND PELVIS WITHOUT CONTRAST TECHNIQUE: Multidetector CT imaging of the abdomen and pelvis was performed following the standard protocol without IV contrast. RADIATION DOSE REDUCTION: This exam was performed according to the departmental dose-optimization program which includes automated exposure control, adjustment of the mA and/or kV according to patient size and/or use of iterative reconstruction technique. COMPARISON:  CT scan abdomen and pelvis from 07/29/2022. FINDINGS: Lower chest: There are subpleural atelectatic changes in the visualized lung bases. No overt consolidation. No pleural effusion. The heart is normal in size. No pericardial effusion. Hepatobiliary: The liver is normal in size. Non-cirrhotic configuration. No suspicious mass. No intrahepatic or extrahepatic bile duct dilation. Gallbladder is distended likely due to fasting status. There is a single sub 5 mm calcified gallstone without imaging signs of acute cholecystitis. No abnormal gallbladder wall thickening or pericholecystic fat stranding. Pancreas: Unremarkable. No pancreatic ductal dilatation or surrounding inflammatory changes. Spleen: Within normal limits. No focal lesion. Adrenals/Urinary Tract: Adrenal glands are unremarkable. Surgically absent bilateral kidneys. There is a transplanted left pelvic kidney. No hydronephrosis/hydroureter noted in the transplant kidney. There are several subcentimeter sized hypoattenuating foci in the kidney, which appears similar to the prior study and favored to represent proteinaceous/hemorrhagic cysts. There is also a partially exophytic simple cyst arising from the lower pole  measuring up to 2.3 x 2.6 cm. Unremarkable urinary bladder. Stomach/Bowel: No disproportionate dilation of the small or large bowel loops. No evidence of abnormal bowel wall thickening or inflammatory changes. The appendix was not visualized; however there is no acute inflammatory process in the right lower quadrant. Vascular/Lymphatic: No ascites or pneumoperitoneum. No abdominal or pelvic lymphadenopathy, by size criteria. No aneurysmal dilation of the major abdominal arteries. There are mild peripheral atherosclerotic vascular calcifications of the aorta and its major branches. Reproductive: Normal size prostate. Symmetric seminal vesicles. Other: The visualized soft tissues and abdominal wall are unremarkable. Musculoskeletal: No suspicious osseous lesions. IMPRESSION: 1. No acute inflammatory process identified within the abdomen or pelvis. 2. Multiple other nonacute observations, as described above. Aortic Atherosclerosis (ICD10-I70.0). Electronically Signed   By: Jules Schick M.D.   On: 07/30/2023  13:35    Procedures Procedures    Medications Ordered in ED Medications  promethazine (PHENERGAN) 25 MG/ML injection (  Not Given 07/30/23 0920)  oseltamivir (TAMIFLU) capsule 75 mg (75 mg Oral Given 07/30/23 1258)  acetaminophen (TYLENOL) tablet 1,000 mg (1,000 mg Oral Given 07/30/23 0940)  sodium chloride 0.9 % bolus 1,000 mL (0 mLs Intravenous Stopped 07/30/23 1019)  promethazine (PHENERGAN) 12.5 mg in sodium chloride 0.9 % 50 mL IVPB (0 mg Intravenous Stopped 07/30/23 0955)  sodium zirconium cyclosilicate (LOKELMA) packet 10 g (10 g Oral Given 07/30/23 1009)  sodium chloride 0.9 % bolus 500 mL (0 mLs Intravenous Stopped 07/30/23 1148)  0.9 %  sodium chloride infusion ( Intravenous New Bag/Given 07/30/23 1301)    ED Course/ Medical Decision Making/ A&P                                 Medical Decision Making Amount and/or Complexity of Data Reviewed Labs: ordered. Radiology:  ordered.  Risk OTC drugs. Prescription drug management. Decision regarding hospitalization.   [name] is here with nausea and vomiting.  Positive for influenza A yesterday.  Still with fever today mild tachycardia.  Abdominal cramping as well but no diarrhea.  No major tenderness on abdominal exam.  Has been able to keep fluids down history of chronic kidney disease status post renal transplant concern for dehydration.  Overall we will give IV fluids check basic labs give IV Phenergan and reevaluate.  I do not have any concern for intra-abdominal infectious process such as appendicitis.  I think this is likely ongoing flu.  Have no concern for sepsis.  Will hydrate p.o. challenge.  Lab work overall reassuring no significant leukocytosis or anemia.  Creatinine is 3.1.  Baseline is around 2.7 here recently.  Potassium mildly elevated at 5.5 will give Lokelma.  Overall we will let him finish IV fluids and p.o. challenge.  He feels like Phenergan has helped better than Zofran.  Overall CT scan abdomen pelvis unremarkable.  He still febrile still symptomatic still cannot really tolerate p.o. very well.  Given history of renal transplant mild elevation in kidney function I think it is important to admit him hydrate him make sure he can adequately tolerate p.o.  I have started him on Tamiflu.  Will admit to hospitalist for further care.  This chart was dictated using voice recognition software.  Despite best efforts to proofread,  errors can occur which can change the documentation meaning.         Final Clinical Impression(s) / ED Diagnoses Final diagnoses:  AKI (acute kidney injury) (HCC)  Influenza A  Hyperkalemia    Rx / DC Orders ED Discharge Orders     None         Virgina Norfolk, DO 07/30/23 1344    Virgina Norfolk, DO 07/30/23 1345

## 2023-07-30 NOTE — ED Notes (Signed)
 [facility] at bedside

## 2023-07-30 NOTE — ED Notes (Signed)
Attempt to call report  Floor RN unavailable. Will call back

## 2023-07-30 NOTE — ED Triage Notes (Signed)
 Patient BIB EMS for evaluation of nausea, vomiting, and abdominal pain.  Was seen here yesterday for same symptoms and dx with Influenza A.  States he is not "feeling any better."  Continues to have emesis and feels "like he is dehydrated."  Given a Rx for Zofran and was been using at home per patient.

## 2023-07-30 NOTE — H&P (Incomplete)
 History and Physical    Tyler Alvarez WJX:[id number] DOB: 01-29-1976 DOA: 07/30/2023  PCP: Hilbert Bible, FNP  Patient coming from: [facility]  I have personally briefly reviewed patient's old medical records in [facility]  Chief Complaint:  uri sxs x 1 night then multiple episodes of emesis  HPI: Tyler Alvarez is a 48 y.o. male with medical history significant of  Hypertension, secondary hyperparathyroidism, AAA without rupture, Vit Def  S/p renal transplant 2001 Followed by [facility] health, hx of Tuberous Sclerosis, hx of Seizure disorder well controlled has not had seizures in years.  Patient presents to [facility] with complaint of uri symptoms that began overnight. He notes cough , sore throat, congestion , nausea /emesis as well as fatigue.  Patient notes no sick contact no associated body aches, or abdominal pain or diarrhea.   ED Course:  In ed on evaluation patient has stable vital signs and was diagnosed with the flu. Due to patient chronic immuno suppression patient is admitted for further care.  Vitals  temp 102.5, bp 146/86, hr 110, rr 19  sat 98% , sat decrease to 89% RVP : + Influenza A Wbc 11.1, hgb 12.1,plt 147, with left shift Na 135, K 5.5, cl 104, biacrb 20, bun 38, cr 3.19 (2.76) Lipase 35 UA neg  Tx tylenol 1gram , zofran tab, promethazine,lokelma, tamiflu  Review of Systems: As per HPI otherwise 10 point review of systems negative.   Past Medical History:  Diagnosis Date   Abscess 03/22/2021   ADD (attention deficit disorder)    Anxiety    Ascending aortic aneurysm (HCC)    Benign skin lesion of neck    left neck cyst   Cellulitis 02/24/2021   Cellulitis and abscess of buttock    Chronic kidney disease    s/p transplant 2001   Community acquired pneumonia 01/03/2020   Family history of adverse reaction to anesthesia    Pt mother stated Father-"I think that he stopped breathing, they called a code but he came through okay"   GERD (gastroesophageal  reflux disease)    Headache    Migraines   Heart murmur    Hypertension    Immune deficiency disorder (HCC)    Restless leg syndrome    Seizures (HCC)    treated by Dr. Malvin Johns, no seizures in years and years   Sepsis (HCC) 02/25/2021   Sepsis without acute organ dysfunction (HCC)    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HCC)    Wears glasses     Past Surgical History:  Procedure Laterality Date   AV FISTULA PLACEMENT     KIDNEY TRANSPLANT  2001   LESION EXCISION WITH COMPLEX REPAIR Left 05/24/2021   Procedure: EXCISION BENIGN LESION NECK 9CM, LAYERED CLOSURE NECK 10CM;  Surgeon: Glenna Fellows, MD;  Location: [facility] OR;  Service: Plastics;  Laterality: Left;   MASS EXCISION Left 12/11/2016   Procedure: EXCISION OF BENIGN LESION OF THE NECK WITH LAYERED CLOSURE;  Surgeon: Glenna Fellows, MD;  Location: [facility] OR;  Service: Plastics;  Laterality: Left;   MASS EXCISION     neck x2   MASS EXCISION Bilateral 04/01/2018   Procedure: excision benign lesions left neck and right and left cheek ( 4cm, 2cm, 1cm x3), layered closure neck <10cm, layered closure right cheek 1cm;  Surgeon: Glenna Fellows, MD;  Location: [facility] OR;  Service: Plastics;  Laterality: Bilateral;  left cheek lesion   MULTIPLE TOOTH EXTRACTIONS     NEPHRECTOMY     both removed  in 1999, 3 months apart   REVISON OF ARTERIOVENOUS FISTULA Left 07/16/2014   Procedure: EXCISION ANEURYSMAL AREA OF LEFT ARTERIOVENOUS FISTULA;  Surgeon: Nada Libman, MD;  Location: [facility] OR;  Service: Vascular;  Laterality: Left;     reports that he has never smoked. He has never used smokeless tobacco. He reports that he does not drink alcohol and does not use drugs.  Allergies  Allergen Reactions   Erythromycin Nausea And Vomiting   Sulfa Antibiotics Itching and Nausea And Vomiting    Family History  Problem Relation Age of Onset   Hypothyroidism Mother    Cancer Mother 27       cervical   Hypertension Mother    COPD Mother    COPD Father 55       COPD    Ulcers Father    COPD Brother    Heart disease Brother    Cancer Other     Prior to Admission medications   Medication Sig Start Date End Date Taking? Authorizing Provider  acetaminophen (TYLENOL) 500 MG tablet Take 1,000 mg by mouth daily as needed for mild pain, headache or fever.    [provider]  amLODipine (NORVASC) 5 MG tablet Take 5 mg by mouth daily. 05/19/16   [provider]  aspirin EC 81 MG tablet Take 81 mg by mouth daily. Swallow whole.    [provider]  brompheniramine-pseudoephedrine (DIMETAPP) 1-15 MG/5ML ELIX Take 20 mLs by mouth daily as needed for allergies (Cold).    [provider]  gabapentin (NEURONTIN) 300 MG capsule Take 1 capsule (300 mg total) by mouth at bedtime. 05/30/23   Caudle, [name], FNP  losartan (COZAAR) 25 MG tablet Take 25 mg by mouth daily. 03/31/21   [provider]  ondansetron (ZOFRAN-ODT) 4 MG disintegrating tablet Take 1 tablet (4 mg total) by mouth every 8 (eight) hours as needed for nausea or vomiting. 07/29/23   Dolphus Jenny, PA-C  predniSONE (DELTASONE) 5 MG tablet Take 5 mg by mouth daily.     [provider]  tacrolimus (PROGRAF) 1 MG capsule Take 2 mg by mouth 2 (two) times daily.     [provider]    Physical Exam: Vitals:   07/30/23 1500 07/30/23 1515 07/30/23 1600 07/30/23 1811  BP: 127/71  134/81 138/74  Pulse: 64 69 81 76  Resp: 18   18  Temp:    99.7 F (37.6 C)  TempSrc:    Oral  SpO2: 95% 98% 95% 97%  Weight:      Height:        Constitutional: NAD, calm, comfortable Vitals:   07/30/23 1500 07/30/23 1515 07/30/23 1600 07/30/23 1811  BP: 127/71  134/81 138/74  Pulse: 64 69 81 76  Resp: 18   18  Temp:    99.7 F (37.6 C)  TempSrc:    Oral  SpO2: 95% 98% 95% 97%  Weight:      Height:       Eyes: PERRL, lids and conjunctivae normal ENMT: Mucous membranes are moist. Posterior pharynx clear of any exudate or lesions.Normal dentition.  Neck:  normal, supple, no masses, no thyromegaly Respiratory: clear to auscultation bilaterally, no wheezing, no crackles. Normal respiratory effort. No accessory muscle use.  Cardiovascular: Regular rate and rhythm, no murmurs / rubs / gallops. No extremity edema. 2+ pedal pulses. No carotid bruits.  Abdomen: no tenderness, no masses palpated. No hepatosplenomegaly. Bowel sounds positive.  Musculoskeletal: no clubbing /  cyanosis. No joint deformity upper and lower extremities. Good ROM, no contractures. Normal muscle tone.  Skin: no rashes, lesions, ulcers. No induration Neurologic: CN 2-12 grossly intact. Sensation intact, DTR normal. Strength 5/5 in all 4.  Psychiatric: Normal judgment and insight. Alert and oriented x 3. Normal mood.    Labs on Admission: I have personally reviewed following labs and imaging studies  CBC: Recent Labs  Lab 07/30/23 0907  WBC 11.1*  NEUTROABS 9.4*  HGB 12.1*  HCT 36.2*  MCV 88.7  PLT 147*   Basic Metabolic Panel: Recent Labs  Lab 07/30/23 0907  NA 135  K 5.5*  CL 104  CO2 20*  GLUCOSE 96  BUN 38*  CREATININE 3.18*  CALCIUM 9.4   GFR: Estimated Creatinine Clearance: 21.3 mL/min (A) (by C-G formula based on SCr of 3.18 mg/dL (H)). Liver Function Tests: Recent Labs  Lab 07/30/23 0907  AST 31  ALT 19  ALKPHOS 95  BILITOT 0.7  PROT 7.6  ALBUMIN 4.4   Recent Labs  Lab 07/30/23 0907  LIPASE 35   No results for input(s): "AMMONIA" in the last 168 hours. Coagulation Profile: No results for input(s): "INR", "PROTIME" in the last 168 hours. Cardiac Enzymes: No results for input(s): "CKTOTAL", "CKMB", "CKMBINDEX", "TROPONINI" in the last 168 hours. BNP (last 3 results) No results for input(s): "PROBNP" in the last 8760 hours. HbA1C: No results for input(s): "HGBA1C" in the last 72 hours. CBG: No results for input(s): "GLUCAP" in the last 168 hours. Lipid Profile: No results for input(s): "CHOL", "HDL", "LDLCALC", "TRIG", "CHOLHDL",  "LDLDIRECT" in the last 72 hours. Thyroid Function Tests: No results for input(s): "TSH", "T4TOTAL", "FREET4", "T3FREE", "THYROIDAB" in the last 72 hours. Anemia Panel: No results for input(s): "VITAMINB12", "FOLATE", "FERRITIN", "TIBC", "IRON", "RETICCTPCT" in the last 72 hours. Urine analysis:    Component Value Date/Time   COLORURINE YELLOW 07/30/2023 1413   APPEARANCEUR CLEAR 07/30/2023 1413   LABSPEC 1.014 07/30/2023 1413   PHURINE 6.5 07/30/2023 1413   GLUCOSEU NEGATIVE 07/30/2023 1413   HGBUR NEGATIVE 07/30/2023 1413   BILIRUBINUR NEGATIVE 07/30/2023 1413   KETONESUR TRACE (A) 07/30/2023 1413   PROTEINUR 100 (A) 07/30/2023 1413   UROBILINOGEN 1.0 07/20/2014 1751   NITRITE NEGATIVE 07/30/2023 1413   LEUKOCYTESUR NEGATIVE 07/30/2023 1413    Radiological Exams on Admission: CT ABDOMEN PELVIS WO CONTRAST Result Date: 07/30/2023 CLINICAL DATA:  Abdominal pain, acute, nonlocalized. Nausea, vomiting and abdominal pain. EXAM: CT ABDOMEN AND PELVIS WITHOUT CONTRAST TECHNIQUE: Multidetector CT imaging of the abdomen and pelvis was performed following the standard protocol without IV contrast. RADIATION DOSE REDUCTION: This exam was performed according to the departmental dose-optimization program which includes automated exposure control, adjustment of the mA and/or kV according to patient size and/or use of iterative reconstruction technique. COMPARISON:  CT scan abdomen and pelvis from 07/29/2022. FINDINGS: Lower chest: There are subpleural atelectatic changes in the visualized lung bases. No overt consolidation. No pleural effusion. The heart is normal in size. No pericardial effusion. Hepatobiliary: The liver is normal in size. Non-cirrhotic configuration. No suspicious mass. No intrahepatic or extrahepatic bile duct dilation. Gallbladder is distended likely due to fasting status. There is a single sub 5 mm calcified gallstone without imaging signs of acute cholecystitis. No abnormal  gallbladder wall thickening or pericholecystic fat stranding. Pancreas: Unremarkable. No pancreatic ductal dilatation or surrounding inflammatory changes. Spleen: Within normal limits. No focal lesion. Adrenals/Urinary Tract: Adrenal glands are unremarkable. Surgically absent bilateral kidneys. There is a transplanted left  pelvic kidney. No hydronephrosis/hydroureter noted in the transplant kidney. There are several subcentimeter sized hypoattenuating foci in the kidney, which appears similar to the prior study and favored to represent proteinaceous/hemorrhagic cysts. There is also a partially exophytic simple cyst arising from the lower pole measuring up to 2.3 x 2.6 cm. Unremarkable urinary bladder. Stomach/Bowel: No disproportionate dilation of the small or large bowel loops. No evidence of abnormal bowel wall thickening or inflammatory changes. The appendix was not visualized; however there is no acute inflammatory process in the right lower quadrant. Vascular/Lymphatic: No ascites or pneumoperitoneum. No abdominal or pelvic lymphadenopathy, by size criteria. No aneurysmal dilation of the major abdominal arteries. There are mild peripheral atherosclerotic vascular calcifications of the aorta and its major branches. Reproductive: Normal size prostate. Symmetric seminal vesicles. Other: The visualized soft tissues and abdominal wall are unremarkable. Musculoskeletal: No suspicious osseous lesions. IMPRESSION: 1. No acute inflammatory process identified within the abdomen or pelvis. 2. Multiple other nonacute observations, as described above. Aortic Atherosclerosis (ICD10-I70.0). Electronically Signed   By: Jules Schick M.D.   On: 07/30/2023 13:35    EKG: Independently reviewed. Pending   Assessment/Plan  Influenza A with acute hypoxemia -admit to cardiac tele  - supportive O2 as needed  - dedicated chest imaging ordered  - continue with tamiflu  -supportive care for fever/n/v/ - of note CTAB/pelvis  NAD   AKI on CKIIIb in setting of Renal transplant status  -hold nephrotoxic medications  - place on gently hydration  -monitor labs  -resume immunosuppressives ( prednisone, prograf) - consult renal for further treatment   Hyperkalemia -s/p lokelma  -will monitor  labs  -repeat treatment as needed   Hypertension  - bp currently stable  -resume amlodipine as bp tolerates  -hold cozaar for now   AAA without rupture -no active issues    Tuberous Sclerosis,  hx of Seizure disorder - well controlled has not had seizures in years -of AED  -place on seizure precuations   DVT prophylaxis: heparin Code Status: full/ as discussed per patient wishes in event of cardiac arrest  Family Communication: none at bedside Disposition Plan: patient  expected to be admitted greater than 2 midnights  Consults called: Nephrology  Admission status: cardiac tele   Lurline Del MD [facility]   If 7PM-7AM, please contact night-coverage [email or web address] Password [facility]  07/30/2023, 7:43 PM

## 2023-07-30 NOTE — Progress Notes (Signed)
 PHARMACY NOTE:  ANTIMICROBIAL RENAL DOSAGE ADJUSTMENT  Current antimicrobial regimen includes a mismatch between antimicrobial dosage and estimated renal function.  As per policy approved by the Pharmacy & Therapeutics and Medical Executive Committees, the antimicrobial dosage will be adjusted accordingly.  Current antimicrobial dosage:  Oseltamivir 75mg  PO BID  Indication: Influneza  Renal Function:  Estimated Creatinine Clearance: 21.3 mL/min (A) (by C-G formula based on SCr of 3.18 mg/dL (H)). []      On intermittent HD, scheduled: []      On CRRT    Antimicrobial dosage has been changed to: Oseltamivir 30mg  once daily   Thank you for allowing pharmacy to be a part of this patient's care.  Rennis Petty, [facility] 07/30/2023 6:14 PM

## 2023-07-31 DIAGNOSIS — R112 Nausea with vomiting, unspecified: Secondary | ICD-10-CM | POA: Diagnosis not present

## 2023-07-31 LAB — NA AND K (SODIUM & POTASSIUM), RAND UR
Potassium Urine: 12 mmol/L
Sodium, Ur: 140 mmol/L

## 2023-07-31 LAB — CREATININE, URINE, RANDOM: Creatinine, Urine: 41 mg/dL

## 2023-07-31 MED ORDER — GABAPENTIN 300 MG PO CAPS
300.0000 mg | ORAL_CAPSULE | Freq: Every day | ORAL | Status: DC
  Administered 2023-07-31 – 2023-08-01 (×2): 300 mg via ORAL
  Filled 2023-07-31 (×2): qty 1

## 2023-07-31 NOTE — Consult Note (Signed)
 Reason for Consult:Transplanted Kidney Referring Physician: Dr. Skip Mayer  Chief Complaint: Cough, fever  Assessment/Plan: Kidney injury on CKD 3B with a baseline creatinine of 2.6-2.8 most recently 2.76 at [facility] in April 27, 2023.  Last time patient was seen by Dr. Marisue Humble was December 26, 2022; of note in June 2024 his creatinine was 2.65 at the office.  Patient likely volume down based on history. -Will send urine studies -No evidence of obstruction on CT without contrast of the transplanted kidney on the left side. -Will check a Prograf trough level; continue immunosuppressives at this time. Currently on Prograf 2mg  BID and prednisone 5mg  daily. Continue hydration for now. -Monitor Daily I/Os, Daily weight  -Maintain MAP>65 for optimal renal perfusion.  - Avoid nephrotoxic agents such as IV contrast, NSAIDs, and phosphate containing bowel preps (FLEETS)  Hyperkalemia s/p lokelma and will monitor response. HTN -just mildly hypertensive and agree with holding Cozaar. Influenza A positive - per primary, on Tamiflu. H/o tuberous sclerosis but no history of recent seizure activity.   HPI: Tyler Alvarez is an 48 y.o. male w/ a h/o hypertension, ascending aortic aneurysm [facility] by [facility], tuberous sclerosis, seizure disorder, migraines, gout, remote h/o left SCV DVT + left brachial arterial thrombus in 2016, b/l nephrectomy RCC 2009,  living donor kidney transplant 02/22/2000 on tacrolimus 2mg  BID, prednisone 5mg  daily, creatinine in 12/13 was 2.76.  Patient was diagnosed with flu on 3/16 complaining of fever chills, sore throat, nausea and vomiting.  He was not able to keep fluids down at home despite Zofran but states that he was able to keep his immunosuppressive drugs down on 3/17.  She denies any abdominal pain, chest pain, diarrhea or swelling in the lower extremities.  She also has had some headaches but no visual complaints.  Patient's temperature in the emergency department is  102.5 degrees, positive for influenza A, potassium is 5.5 and a BUN/creatinine was 38 and 3.19.  Treated with Tamiflu and Lokelma.   ROS Pertinent items are noted in HPI.  Chemistry and CBC: Creatinine  Date/Time Value Ref Range Status  09/14/2014 12:00 AM 1.6 (A) 0.6 - 1.3 mg/dL Final   Creatinine, Ser  Date/Time Value Ref Range Status  07/30/2023 09:31 PM 2.91 (H) 0.61 - 1.24 mg/dL Final  40/98/1191 47:82 AM 3.18 (H) 0.61 - 1.24 mg/dL Final  95/62/1308 65:78 PM 2.76 (H) 0.61 - 1.24 mg/dL Final  46/96/2952 84:13 PM 2.48 (H) 0.61 - 1.24 mg/dL Final  24/40/1027 25:36 AM 2.65 (H) 0.61 - 1.24 mg/dL Final  64/40/3474 25:95 PM 2.53 (H) 0.61 - 1.24 mg/dL Final  63/87/5643 32:95 AM 1.83 (H) 0.61 - 1.24 mg/dL Final  18/84/1660 63:01 AM 1.91 (H) 0.61 - 1.24 mg/dL Final  60/02/9322 55:73 AM 1.92 (H) 0.61 - 1.24 mg/dL Final  22/06/5425 06:23 AM 2.25 (H) 0.61 - 1.24 mg/dL Final  76/28/3151 76:16 PM 2.26 (H) 0.61 - 1.24 mg/dL Final  07/37/1062 69:48 AM 2.01 (H) 0.76 - 1.27 mg/dL Final  54/62/7035 00:93 AM 1.82 (H) 0.61 - 1.24 mg/dL Final  81/82/9937 16:96 AM 1.88 (H) 0.61 - 1.24 mg/dL Final  78/93/8101 75:10 PM 2.15 (H) 0.61 - 1.24 mg/dL Final  25/85/2778 24:23 PM 1.99 (H) 0.76 - 1.27 mg/dL Final  53/61/4431 54:00 AM 1.69 (H) 0.61 - 1.24 mg/dL Final  86/76/1950 93:26 AM 1.88 (H) 0.61 - 1.24 mg/dL Final  71/24/5809 98:33 AM 2.50 (H) 0.61 - 1.24 mg/dL Final  82/50/5397 67:34 AM 2.51 (H) 0.61 - 1.24 mg/dL Final  09/05/2019 04:23 PM 2.62 (H) 0.61 - 1.24 mg/dL Final  40/98/1191 47:82 AM 2.09 (H) 0.61 - 1.24 mg/dL Final  95/62/1308 65:78 AM 1.91 (H) 0.61 - 1.24 mg/dL Final  46/96/2952 84:13 AM 1.62 (H) 0.50 - 1.35 mg/dL Final  24/40/1027 25:36 AM 1.45 (H) 0.50 - 1.35 mg/dL Final  64/40/3474 25:95 AM 1.37 (H) 0.50 - 1.35 mg/dL Final  63/87/5643 32:95 AM 1.33 0.50 - 1.35 mg/dL Final  18/84/1660 63:01 AM 1.44 (H) 0.50 - 1.35 mg/dL Final  60/02/9322 55:73 AM 1.53 (H) 0.50 - 1.35 mg/dL Final   22/06/5425 06:23 PM 1.52 (H) 0.50 - 1.35 mg/dL Final  76/28/3151 76:16 AM 1.55 (H) 0.50 - 1.35 mg/dL Final  07/37/1062 69:48 PM 1.61 (H) 0.50 - 1.35 mg/dL Final  54/62/7035 00:93 PM 1.9 (H) 0.4 - 1.5 mg/dL Final  81/82/9937 16:96 PM 2.0 (H) 0.4 - 1.5 mg/dL Final   Recent Labs  Lab 07/30/23 0907 07/30/23 2131  NA 135 139  K 5.5* 4.6  CL 104 110  CO2 20* 20*  GLUCOSE 96 103*  BUN 38* 31*  CREATININE 3.18* 2.91*  CALCIUM 9.4 8.0*  PHOS  --  3.9   Recent Labs  Lab 07/30/23 0907 07/30/23 2131  WBC 11.1* 6.3  NEUTROABS 9.4*  --   HGB 12.1* 11.4*  HCT 36.2* 34.9*  MCV 88.7 90.2  PLT 147* 140*   Liver Function Tests: Recent Labs  Lab 07/30/23 0907 07/30/23 2131  AST 31 28  ALT 19 22  ALKPHOS 95 85  BILITOT 0.7 0.7  PROT 7.6 6.2*  ALBUMIN 4.4 3.1*   Recent Labs  Lab 07/30/23 0907  LIPASE 35   No results for input(s): "AMMONIA" in the last 168 hours. Cardiac Enzymes: No results for input(s): "CKTOTAL", "CKMB", "CKMBINDEX", "TROPONINI" in the last 168 hours. Iron Studies: No results for input(s): "IRON", "TIBC", "TRANSFERRIN", "FERRITIN" in the last 72 hours. PT/INR: @LABRCNTIP (inr:5)  Xrays/Other Studies: ) Results for orders placed or performed during the hospital encounter of 07/30/23 (from the past 48 hours)  CBC with Differential     Status: Abnormal   Collection Time: 07/30/23  9:07 AM  Result Value Ref Range   WBC 11.1 (H) 4.0 - 10.5 K/uL   RBC 4.08 (L) 4.22 - 5.81 MIL/uL   Hemoglobin 12.1 (L) 13.0 - 17.0 g/dL   HCT 78.9 (L) 38.1 - 01.7 %   MCV 88.7 80.0 - 100.0 fL   MCH 29.7 26.0 - 34.0 pg   MCHC 33.4 30.0 - 36.0 g/dL   RDW 51.0 25.8 - 52.7 %   Platelets 147 (L) 150 - 400 K/uL   nRBC 0.0 0.0 - 0.2 %   Neutrophils Relative % 84 %   Neutro Abs 9.4 (H) 1.7 - 7.7 K/uL   Lymphocytes Relative 6 %   Lymphs Abs 0.7 0.7 - 4.0 K/uL   Monocytes Relative 8 %   Monocytes Absolute 0.9 0.1 - 1.0 K/uL   Eosinophils Relative 0 %   Eosinophils Absolute 0.0  0.0 - 0.5 K/uL   Basophils Relative 1 %   Basophils Absolute 0.1 0.0 - 0.1 K/uL   Immature Granulocytes 1 %   Abs Immature Granulocytes 0.05 0.00 - 0.07 K/uL    Comment: Performed at [facility], [address], [place], [place] [address]  Basic metabolic panel     Status: Abnormal   Collection Time: 07/30/23  9:07 AM  Result Value Ref Range   Sodium 135 135 - 145 mmol/L  Potassium 5.5 (H) 3.5 - 5.1 mmol/L   Chloride 104 98 - 111 mmol/L   CO2 20 (L) 22 - 32 mmol/L   Glucose, Bld 96 70 - 99 mg/dL    Comment: Glucose reference range applies only to samples taken after fasting for at least 8 hours.   BUN 38 (H) 6 - 20 mg/dL   Creatinine, Ser 8.65 (H) 0.61 - 1.24 mg/dL   Calcium 9.4 8.9 - 78.4 mg/dL   GFR, Estimated 23 (L) >60 mL/min    Comment: (NOTE) Calculated using the CKD-EPI Creatinine Equation (2021)    Anion gap 11 5 - 15    Comment: Performed at [facility], [address] [place], [place] [address]  Hepatic function panel     Status: None   Collection Time: 07/30/23  9:07 AM  Result Value Ref Range   Total Protein 7.6 6.5 - 8.1 g/dL   Albumin 4.4 3.5 - 5.0 g/dL   AST 31 15 - 41 U/L   ALT 19 0 - 44 U/L   Alkaline Phosphatase 95 38 - 126 U/L   Total Bilirubin 0.7 0.0 - 1.2 mg/dL   Bilirubin, Direct 0.1 0.0 - 0.2 mg/dL   Indirect Bilirubin 0.6 0.3 - 0.9 mg/dL    Comment: Performed at [facility], [address], [place], [place] [address]  Lipase, blood     Status: None   Collection Time: 07/30/23  9:07 AM  Result Value Ref Range   Lipase 35 11 - 51 U/L    Comment: Performed at [facility], [address] [place], [place] [address]  Urinalysis, Routine w reflex microscopic -Urine, Clean Catch     Status: Abnormal   Collection Time: 07/30/23  2:13 PM  Result Value Ref Range   Color, Urine YELLOW YELLOW   APPearance CLEAR CLEAR   Specific Gravity, Urine 1.014 1.005 - 1.030   pH 6.5  5.0 - 8.0   Glucose, UA NEGATIVE NEGATIVE mg/dL   Hgb urine dipstick NEGATIVE NEGATIVE   Bilirubin Urine NEGATIVE NEGATIVE   Ketones, ur TRACE (A) NEGATIVE mg/dL   Protein, ur 102 (A) NEGATIVE mg/dL   Nitrite NEGATIVE NEGATIVE   Leukocytes,Ua NEGATIVE NEGATIVE   RBC / HPF 0-5 0 - 5 RBC/hpf   WBC, UA 0-5 0 - 5 WBC/hpf   Bacteria, UA NONE SEEN NONE SEEN   Squamous Epithelial / HPF 0-5 0 - 5 /HPF    Comment: Performed at [facility], [address] [place], [place], [place] [address]  HIV Antibody (routine testing w rflx)     Status: None   Collection Time: 07/30/23  9:31 PM  Result Value Ref Range   HIV Screen 4th Generation wRfx Non Reactive Non Reactive    Comment: Performed at [facility], [address], [place], [place] [address]  CBC     Status: Abnormal   Collection Time: 07/30/23  9:31 PM  Result Value Ref Range   WBC 6.3 4.0 - 10.5 K/uL   RBC 3.87 (L) 4.22 - 5.81 MIL/uL   Hemoglobin 11.4 (L) 13.0 - 17.0 g/dL   HCT 47.4 (L) 25.9 - 56.3 %   MCV 90.2 80.0 - 100.0 fL   MCH 29.5 26.0 - 34.0 pg   MCHC 32.7 30.0 - 36.0 g/dL   RDW 87.5 64.3 - 32.9 %   Platelets 140 (L) 150 - 400 K/uL   nRBC 0.0 0.0 - 0.2 %    Comment: Performed at [facility]  [facility], [address] [place], [place] [address]  Procalcitonin     Status: None   Collection Time: 07/30/23  9:31 PM  Result Value Ref Range   Procalcitonin 2.02 ng/mL    Comment:        Interpretation: PCT > 2 ng/mL: Systemic infection (sepsis) is likely, unless other causes are known. (NOTE)       Sepsis PCT Algorithm           Lower Respiratory Tract                                      Infection PCT Algorithm    ----------------------------     ----------------------------         PCT < 0.25 ng/mL                PCT < 0.10 ng/mL          Strongly encourage             Strongly discourage   discontinuation of antibiotics    initiation of antibiotics    ----------------------------      -----------------------------       PCT 0.25 - 0.50 ng/mL            PCT 0.10 - 0.25 ng/mL               OR       >80% decrease in PCT            Discourage initiation of                                            antibiotics      Encourage discontinuation           of antibiotics    ----------------------------     -----------------------------         PCT >= 0.50 ng/mL              PCT 0.26 - 0.50 ng/mL               AND       <80% decrease in PCT              Encourage initiation of                                             antibiotics       Encourage continuation           of antibiotics    ----------------------------     -----------------------------        PCT >= 0.50 ng/mL                  PCT > 0.50 ng/mL               AND         increase in PCT                  Strongly encourage  initiation of antibiotics    Strongly encourage escalation           of antibiotics                                     -----------------------------                                           PCT <= 0.25 ng/mL                                                 OR                                        > 80% decrease in PCT                                      Discontinue / Do not initiate                                             antibiotics  Performed at [facility], [address], [place], [place] [address]   Sedimentation rate     Status: None   Collection Time: 07/30/23  9:31 PM  Result Value Ref Range   Sed Rate 11 0 - 16 mm/hr    Comment: Performed at [facility], [address] [place], [place] [address]  Comprehensive metabolic panel     Status: Abnormal   Collection Time: 07/30/23  9:31 PM  Result Value Ref Range   Sodium 139 135 - 145 mmol/L   Potassium 4.6 3.5 - 5.1 mmol/L   Chloride 110 98 - 111 mmol/L   CO2 20 (L) 22 - 32 mmol/L   Glucose, Bld 103 (H) 70 - 99 mg/dL    Comment: Glucose reference range applies only to  samples taken after fasting for at least 8 hours.   BUN 31 (H) 6 - 20 mg/dL   Creatinine, Ser 8.29 (H) 0.61 - 1.24 mg/dL   Calcium 8.0 (L) 8.9 - 10.3 mg/dL   Total Protein 6.2 (L) 6.5 - 8.1 g/dL   Albumin 3.1 (L) 3.5 - 5.0 g/dL   AST 28 15 - 41 U/L   ALT 22 0 - 44 U/L   Alkaline Phosphatase 85 38 - 126 U/L   Total Bilirubin 0.7 0.0 - 1.2 mg/dL   GFR, Estimated 26 (L) >60 mL/min    Comment: (NOTE) Calculated using the CKD-EPI Creatinine Equation (2021)    Anion gap 9 5 - 15    Comment: Performed at [facility], [address], [place], [place] [address]  Magnesium     Status: Abnormal   Collection Time: 07/30/23  9:31 PM  Result Value Ref Range   Magnesium 1.6 (L) 1.7 - 2.4 mg/dL    Comment: Performed at [facility],  [address] [place], [place] [address]  Phosphorus     Status: None   Collection Time: 07/30/23  9:31 PM  Result Value Ref Range   Phosphorus 3.9 2.5 - 4.6 mg/dL    Comment: Performed at [facility], [address], [place], [place] [address]  Brain natriuretic peptide     Status: Abnormal   Collection Time: 07/30/23  9:31 PM  Result Value Ref Range   B Natriuretic Peptide 473.9 (H) 0.0 - 100.0 pg/mL    Comment: Performed at [facility], [address], [place], [place] [address]  TSH     Status: None   Collection Time: 07/30/23  9:31 PM  Result Value Ref Range   TSH 0.686 0.350 - 4.500 uIU/mL    Comment: Performed by a 3rd Generation assay with a functional sensitivity of <=0.01 uIU/mL. Performed at [facility], [address], [place], [place] [address]   Hemoglobin A1c     Status: None   Collection Time: 07/30/23  9:31 PM  Result Value Ref Range   Hgb A1c MFr Bld 5.2 4.8 - 5.6 %    Comment: (NOTE) Pre diabetes:          5.7%-6.4%  Diabetes:              >6.4%  Glycemic control for   <7.0% adults with diabetes    Mean Plasma Glucose 102.54 mg/dL    Comment: Performed at [facility], [address],  [place], [place] [address]   DG CHEST PORT 1 VIEW Result Date: 07/30/2023 CLINICAL DATA:  Influenza a. EXAM: PORTABLE CHEST 1 VIEW COMPARISON:  04/27/2023. FINDINGS: The heart size and mediastinal contours are within normal limits. There is atherosclerotic calcification of the aorta. No consolidation, effusion, or pneumothorax. No acute osseous abnormality. IMPRESSION: No active disease. Electronically Signed   By: Thornell Sartorius M.D.   On: 07/30/2023 23:42   CT ABDOMEN PELVIS WO CONTRAST Result Date: 07/30/2023 CLINICAL DATA:  Abdominal pain, acute, nonlocalized. Nausea, vomiting and abdominal pain. EXAM: CT ABDOMEN AND PELVIS WITHOUT CONTRAST TECHNIQUE: Multidetector CT imaging of the abdomen and pelvis was performed following the standard protocol without IV contrast. RADIATION DOSE REDUCTION: This exam was performed according to the departmental dose-optimization program which includes automated exposure control, adjustment of the mA and/or kV according to patient size and/or use of iterative reconstruction technique. COMPARISON:  CT scan abdomen and pelvis from 07/29/2022. FINDINGS: Lower chest: There are subpleural atelectatic changes in the visualized lung bases. No overt consolidation. No pleural effusion. The heart is normal in size. No pericardial effusion. Hepatobiliary: The liver is normal in size. Non-cirrhotic configuration. No suspicious mass. No intrahepatic or extrahepatic bile duct dilation. Gallbladder is distended likely due to fasting status. There is a single sub 5 mm calcified gallstone without imaging signs of acute cholecystitis. No abnormal gallbladder wall thickening or pericholecystic fat stranding. Pancreas: Unremarkable. No pancreatic ductal dilatation or surrounding inflammatory changes. Spleen: Within normal limits. No focal lesion. Adrenals/Urinary Tract: Adrenal glands are unremarkable. Surgically absent bilateral kidneys. There is a transplanted left pelvic kidney. No  hydronephrosis/hydroureter noted in the transplant kidney. There are several subcentimeter sized hypoattenuating foci in the kidney, which appears similar to the prior study and favored to represent proteinaceous/hemorrhagic cysts. There is also a partially exophytic simple cyst arising from the lower pole measuring up to 2.3 x 2.6 cm. Unremarkable urinary bladder. Stomach/Bowel: No disproportionate dilation of the small or large  bowel loops. No evidence of abnormal bowel wall thickening or inflammatory changes. The appendix was not visualized; however there is no acute inflammatory process in the right lower quadrant. Vascular/Lymphatic: No ascites or pneumoperitoneum. No abdominal or pelvic lymphadenopathy, by size criteria. No aneurysmal dilation of the major abdominal arteries. There are mild peripheral atherosclerotic vascular calcifications of the aorta and its major branches. Reproductive: Normal size prostate. Symmetric seminal vesicles. Other: The visualized soft tissues and abdominal wall are unremarkable. Musculoskeletal: No suspicious osseous lesions. IMPRESSION: 1. No acute inflammatory process identified within the abdomen or pelvis. 2. Multiple other nonacute observations, as described above. Aortic Atherosclerosis (ICD10-I70.0). Electronically Signed   By: Jules Schick M.D.   On: 07/30/2023 13:35    PMH:   Past Medical History:  Diagnosis Date   Abscess 03/22/2021   ADD (attention deficit disorder)    Anxiety    Ascending aortic aneurysm (HCC)    Benign skin lesion of neck    left neck cyst   Cellulitis 02/24/2021   Cellulitis and abscess of buttock    Chronic kidney disease    s/p transplant 2001   Community acquired pneumonia 01/03/2020   Family history of adverse reaction to anesthesia    Pt mother stated Father-"I think that he stopped breathing, they called a code but he came through okay"   GERD (gastroesophageal reflux disease)    Headache    Migraines   Heart murmur     Hypertension    Immune deficiency disorder (HCC)    Restless leg syndrome    Seizures (HCC)    treated by Dr. Malvin Johns, no seizures in years and years   Sepsis (HCC) 02/25/2021   Sepsis without acute organ dysfunction (HCC)    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HCC)    Wears glasses     PSH:   Past Surgical History:  Procedure Laterality Date   AV FISTULA PLACEMENT     KIDNEY TRANSPLANT  2001   LESION EXCISION WITH COMPLEX REPAIR Left 05/24/2021   Procedure: EXCISION BENIGN LESION NECK 9CM, LAYERED CLOSURE NECK 10CM;  Surgeon: Glenna Fellows, MD;  Location: [facility] OR;  Service: Plastics;  Laterality: Left;   MASS EXCISION Left 12/11/2016   Procedure: EXCISION OF BENIGN LESION OF THE NECK WITH LAYERED CLOSURE;  Surgeon: Glenna Fellows, MD;  Location: [facility] OR;  Service: Plastics;  Laterality: Left;   MASS EXCISION     neck x2   MASS EXCISION Bilateral 04/01/2018   Procedure: excision benign lesions left neck and right and left cheek ( 4cm, 2cm, 1cm x3), layered closure neck <10cm, layered closure right cheek 1cm;  Surgeon: Glenna Fellows, MD;  Location: [facility] OR;  Service: Plastics;  Laterality: Bilateral;  left cheek lesion   MULTIPLE TOOTH EXTRACTIONS     NEPHRECTOMY     both removed in 1999, 3 months apart   REVISON OF ARTERIOVENOUS FISTULA Left 07/16/2014   Procedure: EXCISION ANEURYSMAL AREA OF LEFT ARTERIOVENOUS FISTULA;  Surgeon: Nada Libman, MD;  Location: [facility] OR;  Service: Vascular;  Laterality: Left;    Allergies:  Allergies  Allergen Reactions   Erythromycin Nausea And Vomiting   Sulfa Antibiotics Itching and Nausea And Vomiting    Medications:   Prior to Admission medications   Medication Sig Start Date End Date Taking? Authorizing Provider  acetaminophen (TYLENOL) 500 MG tablet Take 1,000 mg by mouth daily as needed for mild pain, headache or fever.    [provider]  amLODipine (NORVASC) 5 MG tablet Take 5  mg by mouth daily. 05/19/16   [provider]  aspirin  EC 81 MG tablet Take 81 mg by mouth daily. Swallow whole.    [provider]  brompheniramine-pseudoephedrine (DIMETAPP) 1-15 MG/5ML ELIX Take 20 mLs by mouth daily as needed for allergies (Cold).    [provider]  gabapentin (NEURONTIN) 300 MG capsule Take 1 capsule (300 mg total) by mouth at bedtime. 05/30/23   Caudle, [name], FNP  losartan (COZAAR) 25 MG tablet Take 25 mg by mouth daily. 03/31/21   [provider]  ondansetron (ZOFRAN-ODT) 4 MG disintegrating tablet Take 1 tablet (4 mg total) by mouth every 8 (eight) hours as needed for nausea or vomiting. 07/29/23   Dolphus Jenny, PA-C  predniSONE (DELTASONE) 5 MG tablet Take 5 mg by mouth daily.     [provider]  tacrolimus (PROGRAF) 1 MG capsule Take 2 mg by mouth 2 (two) times daily.     [provider]    Discontinued Meds:   Medications Discontinued During This Encounter  Medication Reason   oseltamivir (TAMIFLU) capsule 75 mg     Social History:  reports that he has never smoked. He has never used smokeless tobacco. He reports that he does not drink alcohol and does not use drugs.  Family History:   Family History  Problem Relation Age of Onset   Hypothyroidism Mother    Cancer Mother 38       cervical   Hypertension Mother    COPD Mother    COPD Father 46       COPD   Ulcers Father    COPD Brother    Heart disease Brother    Cancer Other     Blood pressure (!) 141/81, pulse 81, temperature 98.4 F (36.9 C), temperature source Oral, resp. rate 18, height 6' (1.829 m), weight 53.5 kg, SpO2 97%. General appearance: alert, cooperative, and appears stated age Head: Normocephalic, without obvious abnormality, atraumatic Eyes: negative Neck: no adenopathy, no carotid bruit, no JVD, supple, symmetrical, trachea midline, and thyroid not enlarged, symmetric, no tenderness/mass/nodules Back: symmetric, no curvature. ROM normal. No CVA tenderness. Resp: rhonchi  bibasilar Cardio: regular rate and rhythm, S1, S2 normal, no murmur, click, rub or gallop GI: soft, non-tender; bowel sounds normal; no masses,  no organomegaly Extremities: extremities normal, atraumatic, no cyanosis or edema Pulses: 2+ and symmetric Skin: Skin color, texture, turgor normal. No rashes or lesions       Rebekah Sprinkle, Len Blalock, MD 07/31/2023, 9:10 AM

## 2023-07-31 NOTE — Progress Notes (Signed)
 Tyler Alvarez  ZOX:[id number] DOB: [birth year] DOA: 07/30/2023 PCP: Hilbert Bible, FNP    Brief Narrative:  48 year old with a history of HTN, AAA without rupture, renal failure status post transplant 2001 on chronic immunosuppressants, tuberosclerosis, and seizure disorder who presented to the ER 3/17 with cough sore throat congestion nausea and vomiting as well as severe fatigue.  Temperature at presentation was 102.5.  He tested positive for influenza A.  Goals of Care:   Code Status: Full Code   DVT prophylaxis: heparin injection 5,000 Units Start: 07/30/23 2200   Interim Hx: Afebrile since admission.  Vital signs stable.  Oxygen saturation 97% room air.  Potassium has improved.  Resting comfortably in bed.  Appetite improving.  Denies chest pain vomiting or abdominal pain.  Assessment & Plan:  Influenza A Tamiflu initiated -continue supportive care -appears to be clinically improving  AKI on CKD stage IIIb in setting of renal transplant Continue his usual immunosuppressive agents -baseline creatinine approximately 2.6-2.8 -followed at [facility] transplant clinic -CT without evidence of urinary obstruction -creatinine improving with volume expansion -nephrology following  Hyperkalemia Due to above - dosed with Lokelma with improvement  Hypomagnesemia Monitor without replacement for now with improving diet -if drops further may require gentle replacement  Anemia of chronic kidney disease No evidence of acute blood loss -check anemia panel  HTN Avoid ACE/ARB at this time -reasonably controlled at present  AAA without rupture Asymptomatic  Tuberous sclerosis - Seizure disorder Well-controlled -continue usual AEDs  Family Communication: Spoke with patient's mother at bedside Disposition: Eventual discharge home   Objective: Blood pressure (!) 141/81, pulse 81, temperature 98.4 F (36.9 C), temperature source Oral, resp. rate 18, height 6' (1.829 m), weight 53.5 kg,  SpO2 97%.  Intake/Output Summary (Last 24 hours) at 07/31/2023 1016 Last data filed at 07/31/2023 8119 Gross per 24 hour  Intake 810.63 ml  Output 0 ml  Net 810.63 ml   Filed Weights   07/30/23 0637  Weight: 53.5 kg    Examination: General: No acute respiratory distress Lungs: Clear to auscultation bilaterally without wheezes or crackles Cardiovascular: Regular rate and rhythm without murmur gallop or rub normal S1 and S2 Abdomen: Nontender, nondistended, soft, bowel sounds positive, no rebound, no ascites, no appreciable mass Extremities: No significant cyanosis, clubbing, or edema bilateral lower extremities  CBC: Recent Labs  Lab 07/30/23 0907 07/30/23 2131  WBC 11.1* 6.3  NEUTROABS 9.4*  --   HGB 12.1* 11.4*  HCT 36.2* 34.9*  MCV 88.7 90.2  PLT 147* 140*   Basic Metabolic Panel: Recent Labs  Lab 07/30/23 0907 07/30/23 2131  NA 135 139  K 5.5* 4.6  CL 104 110  CO2 20* 20*  GLUCOSE 96 103*  BUN 38* 31*  CREATININE 3.18* 2.91*  CALCIUM 9.4 8.0*  MG  --  1.6*  PHOS  --  3.9   GFR: Estimated Creatinine Clearance: 23.2 mL/min (A) (by C-G formula based on SCr of 2.91 mg/dL (H)).   Scheduled Meds:  amLODipine  5 mg Oral Daily   aspirin EC  81 mg Oral Daily   guaiFENesin  600 mg Oral BID   heparin  5,000 Units Subcutaneous Q8H   oseltamivir  30 mg Oral Daily   predniSONE  5 mg Oral Daily   tacrolimus  2 mg Oral BID   Continuous Infusions:  sodium chloride 75 mL/hr at 07/31/23 0913     LOS: 1 day   Lonia Blood, MD [facility] Office  [phone] Pager - Text Page per Loretha Stapler  If 7PM-7AM, please contact night-coverage per Amion 07/31/2023, 10:16 AM

## 2023-07-31 NOTE — TOC CM/SW Note (Signed)
 Transition of Care [facility]) - Inpatient Brief Assessment   Patient Details  Name: Tyler Alvarez MRN: [id number] Date of Birth: 21-Nov-1975  Transition of Care [facility]) CM/SW Contact:    Tyler Alvarez, Tyler Coria, RN Phone Number: 07/31/2023, 2:03 PM   Clinical Narrative:  Patient presented to the ED with Cough, Sore Throat, Congestion , N/V and Fatigue. Found to be Influenza A positive.  Has hx of Hypertension, Seizures, Hyperparathyroidism, AAA without Rupture, Tuberous Sclerosis, Renal Transplant in 2001. Nephrology following. ID following for Antimicrobial.   From home with mother, Tyler Alvarez. Does not have children, not employed, on SSI. Has a cane and walker at home. PCP is Caudle, [name], FNP and uses [facility] on [address] [place] in [place].   Patient not Medically ready for discharge.  CM will continue to follow as patient progresses with care towards discharge.           Transition of Care Asessment: Insurance and Status: Insurance coverage has been reviewed Patient has primary care physician: Yes Home environment has been reviewed: Yes Prior level of function:: Modified Independent Prior/Current Home Services: No current home services Social Drivers of Health Review: SDOH reviewed no interventions necessary   Transition of care needs: no transition of care needs at this time

## 2023-08-01 DIAGNOSIS — R112 Nausea with vomiting, unspecified: Secondary | ICD-10-CM | POA: Diagnosis not present

## 2023-08-01 LAB — RENAL FUNCTION PANEL
Albumin: 2.6 g/dL — ABNORMAL LOW (ref 3.5–5.0)
Anion gap: 8 (ref 5–15)
BUN: 24 mg/dL — ABNORMAL HIGH (ref 6–20)
CO2: 19 mmol/L — ABNORMAL LOW (ref 22–32)
Calcium: 8.5 mg/dL — ABNORMAL LOW (ref 8.9–10.3)
Chloride: 107 mmol/L (ref 98–111)
Creatinine, Ser: 2.42 mg/dL — ABNORMAL HIGH (ref 0.61–1.24)
GFR, Estimated: 32 mL/min — ABNORMAL LOW (ref >60)
Glucose, Bld: 89 mg/dL (ref 70–99)
Phosphorus: 2.8 mg/dL (ref 2.5–4.6)
Potassium: 4.6 mmol/L (ref 3.5–5.1)
Sodium: 134 mmol/L — ABNORMAL LOW (ref 135–145)

## 2023-08-01 LAB — CBC
HCT: 32.1 % — ABNORMAL LOW (ref 39.0–52.0)
Hemoglobin: 10.5 g/dL — ABNORMAL LOW (ref 13.0–17.0)
MCH: 29.2 pg (ref 26.0–34.0)
MCHC: 32.7 g/dL (ref 30.0–36.0)
MCV: 89.4 fL (ref 80.0–100.0)
Platelets: 133 10*3/uL — ABNORMAL LOW (ref 150–400)
RBC: 3.59 MIL/uL — ABNORMAL LOW (ref 4.22–5.81)
RDW: 13.3 % (ref 11.5–15.5)
WBC: 3.4 10*3/uL — ABNORMAL LOW (ref 4.0–10.5)
nRBC: 0 % (ref 0.0–0.2)

## 2023-08-01 LAB — TACROLIMUS LEVEL: Tacrolimus (FK506) - LabCorp: 1.6 ng/mL — ABNORMAL LOW (ref 5.0–20.0)

## 2023-08-01 LAB — MAGNESIUM: Magnesium: 1.4 mg/dL — ABNORMAL LOW (ref 1.7–2.4)

## 2023-08-01 MED ORDER — MAGNESIUM OXIDE -MG SUPPLEMENT 400 (240 MG) MG PO TABS
400.0000 mg | ORAL_TABLET | Freq: Two times a day (BID) | ORAL | Status: AC
  Administered 2023-08-01 (×2): 400 mg via ORAL
  Filled 2023-08-01 (×2): qty 1

## 2023-08-01 NOTE — Progress Notes (Signed)
 Mobility Specialist Progress Note:   08/01/23 1100  Mobility  Activity Ambulated with assistance in hallway  Level of Assistance Standby assist, set-up cues, supervision of patient - no hands on  Assistive Device Front wheel walker  Distance Ambulated (ft) 100 ft  Activity Response Tolerated well  Mobility Referral Yes  Mobility visit 1 Mobility  Mobility Specialist Start Time (ACUTE ONLY) 1026  Mobility Specialist Stop Time (ACUTE ONLY) 1037  Mobility Specialist Time Calculation (min) (ACUTE ONLY) 11 min   Received pt in bed having no complaints and agreeable to mobility. Pt was asymptomatic throughout ambulation and returned to room w/o fault. Left in bed w/ call bell in reach and all needs met.   Tyler Alvarez Mobility Specialist Please contact via Special educational needs teacher or Rehab office at [phone]

## 2023-08-01 NOTE — Progress Notes (Signed)
 SHAHIR KAREN is an 48 y.o. male  w/ a h/o HTN, ascending aortic aneurysm followed by [facility] CTSx, tuberous sclerosis, seizure disorder, migraines, gout, remote h/o left SCV DVT + left brachial arterial thrombus in 2016, b/l nephrectomy RCC 2009,  living donor kidney transplant 02/22/2000 on tacrolimus 2mg  BID, prednisone 5mg  daily, creatinine in 12/13 was 2.76. Here with flu, not able to keep fluids down at home. BUN/creatinine was 38 and 3.19.  Treated with Tamiflu and Lokelma.    Assessment/Plan: Kidney injury on CKD 3B with a baseline creatinine of 2.6-2.8 most recently 2.76 at [facility] in April 27, 2023.  Last time patient was seen by Dr. Marisue Humble was December 26, 2022; of note in June 2024 his creatinine was 2.65 at the office.  Patient likely volume down based on history. Urine studies not revealing. -No evidence of obstruction on CT without contrast of the transplanted kidney on the left side. -Prograf trough level sent 3/18; continue immunosuppressives at this time. Currently on Prograf 2mg  BID and prednisone 5mg  daily.  - Renal function back to baseline; signing off at this time; please reconsult as needed. .  -Monitor Daily I/Os, Daily weight  -Maintain MAP>65 for optimal renal perfusion.  - Avoid nephrotoxic agents such as IV contrast, NSAIDs, and phosphate containing bowel preps (FLEETS)   Hyperkalemia s/p lokelma, improved with good UOP as well. HTN -just mildly hypertensive and agree with holding Cozaar. Influenza A positive - per primary, on Tamiflu. H/o tuberous sclerosis but no history of recent seizure activity.  Subjective: Still some nausea but no vomiting; some chills at night. Denies sob.   Chemistry and CBC: Creatinine  Date/Time Value Ref Range Status  09/14/2014 12:00 AM 1.6 (A) 0.6 - 1.3 mg/dL Final   Creatinine, Ser  Date/Time Value Ref Range Status  08/01/2023 08:26 AM 2.42 (H) 0.61 - 1.24 mg/dL Final  19/14/7829 56:21 PM 2.91 (H) 0.61 - 1.24 mg/dL Final   30/86/5784 69:62 AM 3.18 (H) 0.61 - 1.24 mg/dL Final  95/28/4132 44:01 PM 2.76 (H) 0.61 - 1.24 mg/dL Final  02/72/5366 44:03 PM 2.48 (H) 0.61 - 1.24 mg/dL Final  47/42/5956 38:75 AM 2.65 (H) 0.61 - 1.24 mg/dL Final  64/33/2951 88:41 PM 2.53 (H) 0.61 - 1.24 mg/dL Final  66/10/3014 01:09 AM 1.83 (H) 0.61 - 1.24 mg/dL Final  32/35/5732 20:25 AM 1.91 (H) 0.61 - 1.24 mg/dL Final  42/70/6237 62:83 AM 1.92 (H) 0.61 - 1.24 mg/dL Final  15/17/6160 73:71 AM 2.25 (H) 0.61 - 1.24 mg/dL Final  11/08/9483 46:27 PM 2.26 (H) 0.61 - 1.24 mg/dL Final  03/50/0938 18:29 AM 2.01 (H) 0.76 - 1.27 mg/dL Final  93/71/6967 89:38 AM 1.82 (H) 0.61 - 1.24 mg/dL Final  02/28/5101 58:52 AM 1.88 (H) 0.61 - 1.24 mg/dL Final  77/82/4235 36:14 PM 2.15 (H) 0.61 - 1.24 mg/dL Final  43/15/4008 67:61 PM 1.99 (H) 0.76 - 1.27 mg/dL Final  95/01/3266 12:45 AM 1.69 (H) 0.61 - 1.24 mg/dL Final  80/99/8338 25:05 AM 1.88 (H) 0.61 - 1.24 mg/dL Final  39/76/7341 93:79 AM 2.50 (H) 0.61 - 1.24 mg/dL Final  02/40/9735 32:99 AM 2.51 (H) 0.61 - 1.24 mg/dL Final  24/26/8341 96:22 PM 2.62 (H) 0.61 - 1.24 mg/dL Final  29/79/8921 19:41 AM 2.09 (H) 0.61 - 1.24 mg/dL Final  74/12/1446 18:56 AM 1.91 (H) 0.61 - 1.24 mg/dL Final  31/49/7026 37:85 AM 1.62 (H) 0.50 - 1.35 mg/dL Final  88/50/2774 12:87 AM 1.45 (H) 0.50 - 1.35 mg/dL Final  86/76/7209 47:09 AM  1.37 (H) 0.50 - 1.35 mg/dL Final  16/02/9603 54:09 AM 1.33 0.50 - 1.35 mg/dL Final  81/19/1478 29:56 AM 1.44 (H) 0.50 - 1.35 mg/dL Final  21/30/8657 84:69 AM 1.53 (H) 0.50 - 1.35 mg/dL Final  62/95/2841 32:44 PM 1.52 (H) 0.50 - 1.35 mg/dL Final  05/17/7251 66:44 AM 1.55 (H) 0.50 - 1.35 mg/dL Final  03/47/4259 56:38 PM 1.61 (H) 0.50 - 1.35 mg/dL Final  75/64/3329 51:88 PM 1.9 (H) 0.4 - 1.5 mg/dL Final  41/66/0630 16:01 PM 2.0 (H) 0.4 - 1.5 mg/dL Final   Recent Labs  Lab 07/30/23 0907 07/30/23 2131 08/01/23 0826  NA 135 139 134*  K 5.5* 4.6 4.6  CL 104 110 107  CO2 20* 20* 19*   GLUCOSE 96 103* 89  BUN 38* 31* 24*  CREATININE 3.18* 2.91* 2.42*  CALCIUM 9.4 8.0* 8.5*  PHOS  --  3.9 2.8   Recent Labs  Lab 07/30/23 0907 07/30/23 2131 08/01/23 0826  WBC 11.1* 6.3 3.4*  NEUTROABS 9.4*  --   --   HGB 12.1* 11.4* 10.5*  HCT 36.2* 34.9* 32.1*  MCV 88.7 90.2 89.4  PLT 147* 140* 133*   Liver Function Tests: Recent Labs  Lab 07/30/23 0907 07/30/23 2131 08/01/23 0826  AST 31 28  --   ALT 19 22  --   ALKPHOS 95 85  --   BILITOT 0.7 0.7  --   PROT 7.6 6.2*  --   ALBUMIN 4.4 3.1* 2.6*   Recent Labs  Lab 07/30/23 0907  LIPASE 35   No results for input(s): "AMMONIA" in the last 168 hours. Cardiac Enzymes: No results for input(s): "CKTOTAL", "CKMB", "CKMBINDEX", "TROPONINI" in the last 168 hours. Iron Studies: No results for input(s): "IRON", "TIBC", "TRANSFERRIN", "FERRITIN" in the last 72 hours. PT/INR: @LABRCNTIP (inr:5)  Xrays/Other Studies: ) Results for orders placed or performed during the hospital encounter of 07/30/23 (from the past 48 hours)  Urinalysis, Routine w reflex microscopic -Urine, Clean Catch     Status: Abnormal   Collection Time: 07/30/23  2:13 PM  Result Value Ref Range   Color, Urine YELLOW YELLOW   APPearance CLEAR CLEAR   Specific Gravity, Urine 1.014 1.005 - 1.030   pH 6.5 5.0 - 8.0   Glucose, UA NEGATIVE NEGATIVE mg/dL   Hgb urine dipstick NEGATIVE NEGATIVE   Bilirubin Urine NEGATIVE NEGATIVE   Ketones, ur TRACE (A) NEGATIVE mg/dL   Protein, ur 093 (A) NEGATIVE mg/dL   Nitrite NEGATIVE NEGATIVE   Leukocytes,Ua NEGATIVE NEGATIVE   RBC / HPF 0-5 0 - 5 RBC/hpf   WBC, UA 0-5 0 - 5 WBC/hpf   Bacteria, UA NONE SEEN NONE SEEN   Squamous Epithelial / HPF 0-5 0 - 5 /HPF    Comment: Performed at [facility], [address], [place], [place] [address]  HIV Antibody (routine testing w rflx)     Status: None   Collection Time: 07/30/23  9:31 PM  Result Value Ref Range   HIV Screen 4th Generation wRfx Non  Reactive Non Reactive    Comment: Performed at [facility], [address] [place], [place] [address]  CBC     Status: Abnormal   Collection Time: 07/30/23  9:31 PM  Result Value Ref Range   WBC 6.3 4.0 - 10.5 K/uL   RBC 3.87 (L) 4.22 - 5.81 MIL/uL   Hemoglobin 11.4 (L) 13.0 - 17.0 g/dL   HCT 54.2 (L) 70.6 - 23.7 %   MCV  90.2 80.0 - 100.0 fL   MCH 29.5 26.0 - 34.0 pg   MCHC 32.7 30.0 - 36.0 g/dL   RDW 46.9 62.9 - 52.8 %   Platelets 140 (L) 150 - 400 K/uL   nRBC 0.0 0.0 - 0.2 %    Comment: Performed at [facility], [address], [place], [place] [address]  Procalcitonin     Status: None   Collection Time: 07/30/23  9:31 PM  Result Value Ref Range   Procalcitonin 2.02 ng/mL    Comment:        Interpretation: PCT > 2 ng/mL: Systemic infection (sepsis) is likely, unless other causes are known. (NOTE)       Sepsis PCT Algorithm           Lower Respiratory Tract                                      Infection PCT Algorithm    ----------------------------     ----------------------------         PCT < 0.25 ng/mL                PCT < 0.10 ng/mL          Strongly encourage             Strongly discourage   discontinuation of antibiotics    initiation of antibiotics    ----------------------------     -----------------------------       PCT 0.25 - 0.50 ng/mL            PCT 0.10 - 0.25 ng/mL               OR       >80% decrease in PCT            Discourage initiation of                                            antibiotics      Encourage discontinuation           of antibiotics    ----------------------------     -----------------------------         PCT >= 0.50 ng/mL              PCT 0.26 - 0.50 ng/mL               AND       <80% decrease in PCT              Encourage initiation of                                             antibiotics       Encourage continuation           of antibiotics    ----------------------------     -----------------------------         PCT >= 0.50 ng/mL                  PCT > 0.50 ng/mL               AND  increase in PCT                  Strongly encourage                                      initiation of antibiotics    Strongly encourage escalation           of antibiotics                                     -----------------------------                                           PCT <= 0.25 ng/mL                                                 OR                                        > 80% decrease in PCT                                      Discontinue / Do not initiate                                             antibiotics  Performed at [facility], [address], [place], [place] [address]   Sedimentation rate     Status: None   Collection Time: 07/30/23  9:31 PM  Result Value Ref Range   Sed Rate 11 0 - 16 mm/hr    Comment: Performed at [facility], [address], [place], [place] [address]  Comprehensive metabolic panel     Status: Abnormal   Collection Time: 07/30/23  9:31 PM  Result Value Ref Range   Sodium 139 135 - 145 mmol/L   Potassium 4.6 3.5 - 5.1 mmol/L   Chloride 110 98 - 111 mmol/L   CO2 20 (L) 22 - 32 mmol/L   Glucose, Bld 103 (H) 70 - 99 mg/dL    Comment: Glucose reference range applies only to samples taken after fasting for at least 8 hours.   BUN 31 (H) 6 - 20 mg/dL   Creatinine, Ser 8.29 (H) 0.61 - 1.24 mg/dL   Calcium 8.0 (L) 8.9 - 10.3 mg/dL   Total Protein 6.2 (L) 6.5 - 8.1 g/dL   Albumin 3.1 (L) 3.5 - 5.0 g/dL   AST 28 15 - 41 U/L   ALT 22 0 - 44 U/L   Alkaline Phosphatase 85 38 - 126 U/L   Total Bilirubin 0.7 0.0 - 1.2 mg/dL   GFR, Estimated 26 (L) >60 mL/min    Comment: (NOTE) Calculated using the CKD-EPI Creatinine Equation (2021)    Anion gap 9 5 -  15    Comment: Performed at [facility], [address], [place], [place] [address]  Magnesium     Status: Abnormal   Collection Time: 07/30/23  9:31 PM  Result Value Ref Range   Magnesium  1.6 (L) 1.7 - 2.4 mg/dL    Comment: Performed at [facility], [address], [place], [place] [address]  Phosphorus     Status: None   Collection Time: 07/30/23  9:31 PM  Result Value Ref Range   Phosphorus 3.9 2.5 - 4.6 mg/dL    Comment: Performed at [facility], [address], [place], [place] [address]  Brain natriuretic peptide     Status: Abnormal   Collection Time: 07/30/23  9:31 PM  Result Value Ref Range   B Natriuretic Peptide 473.9 (H) 0.0 - 100.0 pg/mL    Comment: Performed at [facility], [address] [place], [place] [address]  TSH     Status: None   Collection Time: 07/30/23  9:31 PM  Result Value Ref Range   TSH 0.686 0.350 - 4.500 uIU/mL    Comment: Performed by a 3rd Generation assay with a functional sensitivity of <=0.01 uIU/mL. Performed at [facility], [address], [place], [place] [address]   Hemoglobin A1c     Status: None   Collection Time: 07/30/23  9:31 PM  Result Value Ref Range   Hgb A1c MFr Bld 5.2 4.8 - 5.6 %    Comment: (NOTE) Pre diabetes:          5.7%-6.4%  Diabetes:              >6.4%  Glycemic control for   <7.0% adults with diabetes    Mean Plasma Glucose 102.54 mg/dL    Comment: Performed at [facility], [address], [place], [place] [address]  Na and K (sodium & potassium), rand urine     Status: None   Collection Time: 07/31/23  3:33 PM  Result Value Ref Range   Sodium, Ur 140 mmol/L   Potassium Urine 12 mmol/L    Comment: Performed at [facility], [address], [place], [place] [address]  Creatinine, urine, random     Status: None   Collection Time: 07/31/23  3:33 PM  Result Value Ref Range   Creatinine, Urine 41 mg/dL    Comment: Performed at [facility], [address] [place], [place] [address]  Renal function panel     Status: Abnormal   Collection Time: 08/01/23  8:26 AM  Result Value Ref Range   Sodium 134 (L) 135 - 145 mmol/L   Potassium 4.6 3.5 - 5.1 mmol/L    Chloride 107 98 - 111 mmol/L   CO2 19 (L) 22 - 32 mmol/L   Glucose, Bld 89 70 - 99 mg/dL    Comment: Glucose reference range applies only to samples taken after fasting for at least 8 hours.   BUN 24 (H) 6 - 20 mg/dL   Creatinine, Ser 1.02 (H) 0.61 - 1.24 mg/dL   Calcium 8.5 (L) 8.9 - 10.3 mg/dL   Phosphorus 2.8 2.5 - 4.6 mg/dL   Albumin 2.6 (L) 3.5 - 5.0 g/dL   GFR, Estimated 32 (L) >60 mL/min    Comment: (NOTE) Calculated using the CKD-EPI Creatinine Equation (2021)    Anion gap 8 5 - 15    Comment: Performed at [facility], [address], [place], [place] [address]  Magnesium     Status: Abnormal   Collection Time: 08/01/23  8:26 AM  Result Value Ref Range   Magnesium 1.4 (L) 1.7 - 2.4 mg/dL    Comment: Performed at [facility], [address], [place], [place] [address]  CBC     Status: Abnormal   Collection Time: 08/01/23  8:26 AM  Result Value Ref Range   WBC 3.4 (L) 4.0 - 10.5 K/uL   RBC 3.59 (L) 4.22 - 5.81 MIL/uL   Hemoglobin 10.5 (L) 13.0 - 17.0 g/dL   HCT 78.4 (L) 69.6 - 29.5 %   MCV 89.4 80.0 - 100.0 fL   MCH 29.2 26.0 - 34.0 pg   MCHC 32.7 30.0 - 36.0 g/dL   RDW 28.4 13.2 - 44.0 %   Platelets 133 (L) 150 - 400 K/uL   nRBC 0.0 0.0 - 0.2 %    Comment: Performed at [facility], [address] [place], [place] [address]   DG CHEST PORT 1 VIEW Result Date: 07/30/2023 CLINICAL DATA:  Influenza a. EXAM: PORTABLE CHEST 1 VIEW COMPARISON:  04/27/2023. FINDINGS: The heart size and mediastinal contours are within normal limits. There is atherosclerotic calcification of the aorta. No consolidation, effusion, or pneumothorax. No acute osseous abnormality. IMPRESSION: No active disease. Electronically Signed   By: Thornell Sartorius M.D.   On: 07/30/2023 23:42    PMH:   Past Medical History:  Diagnosis Date   Abscess 03/22/2021   ADD (attention deficit disorder)    Anxiety    Ascending aortic aneurysm (HCC)    Benign skin lesion of neck    left neck  cyst   Cellulitis 02/24/2021   Cellulitis and abscess of buttock    Chronic kidney disease    s/p transplant 2001   Community acquired pneumonia 01/03/2020   Family history of adverse reaction to anesthesia    Pt mother stated Father-"I think that he stopped breathing, they called a code but he came through okay"   GERD (gastroesophageal reflux disease)    Headache    Migraines   Heart murmur    Hypertension    Immune deficiency disorder (HCC)    Restless leg syndrome    Seizures (HCC)    treated by Dr. Malvin Johns, no seizures in years and years   Sepsis (HCC) 02/25/2021   Sepsis without acute organ dysfunction (HCC)    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HCC)    Wears glasses     PSH:   Past Surgical History:  Procedure Laterality Date   AV FISTULA PLACEMENT     KIDNEY TRANSPLANT  2001   LESION EXCISION WITH COMPLEX REPAIR Left 05/24/2021   Procedure: EXCISION BENIGN LESION NECK 9CM, LAYERED CLOSURE NECK 10CM;  Surgeon: Glenna Fellows, MD;  Location: [facility] OR;  Service: Plastics;  Laterality: Left;   MASS EXCISION Left 12/11/2016   Procedure: EXCISION OF BENIGN LESION OF THE NECK WITH LAYERED CLOSURE;  Surgeon: Glenna Fellows, MD;  Location: [facility] OR;  Service: Plastics;  Laterality: Left;   MASS EXCISION     neck x2   MASS EXCISION Bilateral 04/01/2018   Procedure: excision benign lesions left neck and right and left cheek ( 4cm, 2cm, 1cm x3), layered closure neck <10cm, layered closure right cheek 1cm;  Surgeon: Glenna Fellows, MD;  Location: [facility] OR;  Service: Plastics;  Laterality: Bilateral;  left cheek lesion   MULTIPLE TOOTH EXTRACTIONS     NEPHRECTOMY     both  removed in 1999, 3 months apart   REVISON OF ARTERIOVENOUS FISTULA Left 07/16/2014   Procedure: EXCISION ANEURYSMAL AREA OF LEFT ARTERIOVENOUS FISTULA;  Surgeon: Nada Libman, MD;  Location: [facility] OR;  Service: Vascular;  Laterality: Left;    Allergies:  Allergies  Allergen Reactions   Erythromycin Nausea And Vomiting   Sulfa  Antibiotics Itching and Nausea And Vomiting    Medications:   Prior to Admission medications   Medication Sig Start Date End Date Taking? Authorizing Provider  acetaminophen (TYLENOL) 500 MG tablet Take 1,000 mg by mouth daily as needed for mild pain, headache or fever.    [provider]  amLODipine (NORVASC) 5 MG tablet Take 5 mg by mouth daily. 05/19/16   [provider]  aspirin EC 81 MG tablet Take 81 mg by mouth daily. Swallow whole.    [provider]  brompheniramine-pseudoephedrine (DIMETAPP) 1-15 MG/5ML ELIX Take 20 mLs by mouth daily as needed for allergies (Cold).    [provider]  gabapentin (NEURONTIN) 300 MG capsule Take 1 capsule (300 mg total) by mouth at bedtime. 05/30/23   Caudle, [name], FNP  losartan (COZAAR) 25 MG tablet Take 25 mg by mouth daily. 03/31/21   [provider]  ondansetron (ZOFRAN-ODT) 4 MG disintegrating tablet Take 1 tablet (4 mg total) by mouth every 8 (eight) hours as needed for nausea or vomiting. 07/29/23   Dolphus Jenny, PA-C  predniSONE (DELTASONE) 5 MG tablet Take 5 mg by mouth daily.     [provider]  tacrolimus (PROGRAF) 1 MG capsule Take 2 mg by mouth 2 (two) times daily.     [provider]    Discontinued Meds:   Medications Discontinued During This Encounter  Medication Reason   oseltamivir (TAMIFLU) capsule 75 mg     Social History:  reports that he has never smoked. He has never used smokeless tobacco. He reports that he does not drink alcohol and does not use drugs.  Family History:   Family History  Problem Relation Age of Onset   Hypothyroidism Mother    Cancer Mother 49       cervical   Hypertension Mother    COPD Mother    COPD Father 69       COPD   Ulcers Father    COPD Brother    Heart disease Brother    Cancer Other     Blood pressure (!) 142/83, pulse 72, temperature 98.4 F (36.9 C), temperature source Oral, resp. rate 18, height 6' (1.829  m), weight 53.5 kg, SpO2 97%. Physical Exam: GEN: NAD, A&Ox3, NCAT HEENT: No conjunctival pallor, EOMI NECK: Supple, no thyromegaly LUNGS: CTA B/L no rales, rhonchi or wheezing CV: RRR, No M/R/G ABD: SNDNT +BS  EXT: No lower extremity edema ACCESS: thrombosed left upper arm access     Dawsyn Ramsaran, Len Blalock, MD 08/01/2023, 12:01 PM

## 2023-08-01 NOTE — Progress Notes (Signed)
 PROGRESS NOTE  Tyler Alvarez  DOB: [birth year]  PCP: Hilbert Bible, FNP WUJ:[id number]  DOA: 07/30/2023  LOS: 2 days  Hospital Day: 3  Brief narrative: Tyler Alvarez is a 48 y.o. male with PMH significant for HTN, AAA without rupture, renal failure s/p transplant 2001 on chronic immunosuppressants, tuberous sclerosis, and seizure disorder  3/17, patient presented to the ED with cough sore throat, congestion, nausea and vomiting as well as severe fatigue.  He had a fever of 102.5  Tested positive for influenza A.  Chest x-ray without pneumonia Admitted to [facility]  Subjective: Patient was seen and examined this morning. Pleasant middle-aged Caucasian male.  Looks older than his stated age. Feels better than at presentation.  Still coughing a lot which is keeping him awake at night he said  Assessment and plan: Influenza A Started on a 5-day course of Tamiflu  Continue supportive care with Mucinex twice daily, podiatry as needed  AKI on CKD stage IIIb  renal transplant status Baseline creatinine 2.6-2.8.  Presented with creatinine elevated to 3.18.  CT abdomen without evidence of obstruction.  Creatinine improved back to baseline with hydration. Nephrology following Continue his usual immunosuppressive agents: Prednisone, Prograf Follows at [facility] transplant clinic Recent Labs    04/25/23 1249 04/27/23 1410 07/30/23 0907 07/30/23 2131 08/01/23 0826  BUN 29* 32* 38* 31* 24*  CREATININE 2.48* 2.76* 3.18* 2.91* 2.42*   Hyperkalemia Hypomagnesemia Potassium level elevated due to AKI.  Improved with Lokelma.   Magnesium level low.  Discussed with nephrologist.  Suggested mag oxide 400 mg twice daily X 2 doses Recent Labs  Lab 07/30/23 0907 07/30/23 2131 08/01/23 0826  K 5.5* 4.6 4.6  MG  --  1.6* 1.4*  PHOS  --  3.9 2.8   Anemia of chronic kidney disease No evidence of acute blood loss.  Obtain anemia panel. Recent Labs    04/25/23 1249 04/27/23 1410  07/30/23 0907 07/30/23 2131 08/01/23 0826  HGB 11.5* 10.5* 12.1* 11.4* 10.5*  MCV 87.5 88.1 88.7 90.2 89.4   HTN Currently controlled on amlodipine Losartan on hold because of AKI   AAA without rupture Asymptomatic   Tuberous sclerosis - Seizure disorder AEDs???    Mobility: Encourage ambulation with mobility  Goals of care   Code Status: Full Code     DVT prophylaxis:  heparin injection 5,000 Units Start: 07/30/23 2200   Antimicrobials: Tamiflu Fluid: None Consultants: Nephrology Family Communication: None at bedside  Status: Inpatient Level of care:  Med-Surg   Patient is from: Home Needs to continue in-hospital care: Gradually improving from flu Anticipated d/c to: Hopefully home tomorrow   Diet:  Diet Order             Diet regular Fluid consistency: Thin  Diet effective now                   Scheduled Meds:  amLODipine  5 mg Oral Daily   aspirin EC  81 mg Oral Daily   gabapentin  300 mg Oral QHS   guaiFENesin  600 mg Oral BID   heparin  5,000 Units Subcutaneous Q8H   magnesium oxide  400 mg Oral BID   oseltamivir  30 mg Oral Daily   predniSONE  5 mg Oral Daily   tacrolimus  2 mg Oral BID    PRN meds: acetaminophen, albuterol, ondansetron (ZOFRAN) IV   Infusions:    Antimicrobials: Anti-infectives (From admission, onward)    Start  Dose/Rate Route Frequency Ordered Stop   07/31/23 1000  oseltamivir (TAMIFLU) capsule 30 mg        30 mg Oral Daily 07/30/23 1814 08/03/23 2359   07/30/23 1245  oseltamivir (TAMIFLU) capsule 75 mg  Status:  Discontinued        75 mg Oral 2 times daily 07/30/23 1241 07/30/23 1813       Objective: Vitals:   08/01/23 0600 08/01/23 0907  BP: 127/81 (!) 142/83  Pulse: 66 72  Resp: 18 18  Temp: 98.4 F (36.9 C)   SpO2: 97% 97%    Intake/Output Summary (Last 24 hours) at 08/01/2023 1438 Last data filed at 08/01/2023 0600 Gross per 24 hour  Intake 120 ml  Output 850 ml  Net -730 ml   Filed  Weights   07/30/23 0637  Weight: 53.5 kg   Weight change:  Body mass index is 16 kg/m.   Physical Exam: General exam: Pleasant, middle-aged Caucasian male.  Looks older for his age Skin: No rashes, lesions or ulcers. HEENT: Atraumatic, normocephalic, no obvious bleeding Lungs: Clear to auscultation bilaterally, coughs on deep breathing CVS: S1, S2, no murmur,   GI/Abd: Soft, nontender, nondistended, bowel sound present,   CNS: Alert, awake, oriented x 3 Psychiatry: Mood appropriate,  Extremities: No pedal edema, no calf tenderness,   Data Review: I have personally reviewed the laboratory data and studies available.  F/u labs  Unresulted Labs (From admission, onward)     Start     Ordered   08/02/23 0500  Vitamin B12  (Anemia Panel (PNL))  Tomorrow morning,   R        08/01/23 1432   08/02/23 0500  Folate  (Anemia Panel (PNL))  Tomorrow morning,   R        08/01/23 1432   08/02/23 0500  Iron and TIBC  (Anemia Panel (PNL))  Tomorrow morning,   R        08/01/23 1432   08/02/23 0500  Ferritin  (Anemia Panel (PNL))  Tomorrow morning,   R        08/01/23 1432   08/02/23 0500  Reticulocytes  (Anemia Panel (PNL))  Tomorrow morning,   R        08/01/23 1432   07/31/23 1930  Tacrolimus level  Once,   R        07/31/23 1930   07/30/23 2016  Tacrolimus level  Once,   R        07/30/23 2020            Total time spent in review of labs and imaging, patient evaluation, formulation of plan, documentation and communication with family: 55 minutes  Signed, Lorin Glass, MD [facility] 08/01/2023

## 2023-08-02 DIAGNOSIS — R112 Nausea with vomiting, unspecified: Secondary | ICD-10-CM | POA: Diagnosis not present

## 2023-08-02 LAB — RETICULOCYTES
Immature Retic Fract: 3.4 % (ref 2.3–15.9)
RBC.: 3.48 MIL/uL — ABNORMAL LOW (ref 4.22–5.81)
Retic Count, Absolute: 32.7 10*3/uL (ref 19.0–186.0)
Retic Ct Pct: 0.9 % (ref 0.4–3.1)

## 2023-08-02 LAB — FOLATE: Folate: 8.2 ng/mL (ref >5.9)

## 2023-08-02 LAB — FERRITIN: Ferritin: 97 ng/mL (ref 24–336)

## 2023-08-02 LAB — VITAMIN B12: Vitamin B-12: 797 pg/mL (ref 180–914)

## 2023-08-02 LAB — IRON AND TIBC
Iron: 88 ug/dL (ref 45–182)
Saturation Ratios: 40 % — ABNORMAL HIGH (ref 17.9–39.5)
TIBC: 223 ug/dL — ABNORMAL LOW (ref 250–450)
UIBC: 135 ug/dL

## 2023-08-02 MED ORDER — GUAIFENESIN ER 600 MG PO TB12: 600.0000 mg | ORAL_TABLET | Freq: Two times a day (BID) | ORAL | 0 refills | Status: AC

## 2023-08-02 MED ORDER — OSELTAMIVIR PHOSPHATE 30 MG PO CAPS: 30.0000 mg | ORAL_CAPSULE | Freq: Every day | ORAL | 0 refills | Status: AC

## 2023-08-02 NOTE — TOC Transition Note (Addendum)
 Transition of Care [facility]) - Discharge Note   Patient Details  Name: Tyler Alvarez MRN: [id number] Date of Birth: [birth year]  [facility] CM/SW Contact:  Tom-Johnson, Hershal Coria, RN Phone Number: 08/02/2023, 11:28 AM   Clinical Narrative:     Patient is scheduled for discharge today.  Readmission Risk Assessment done. Outpatient f/u, hospital f/u and discharge instructions on AVS. No TOC needs or recommendations noted. Mother, [name] to transport at discharge.  No further TOC needs noted.  12:20- CM notified by staff that patient is declining discharge. CM spoke with  patient and he insisted on not going home. States he was told he could stay another night and he does not feel safe going home.  CM informed patient and his mother, [name] at bedside that he has Medicare and has the right to appeal discharge. CM explained the appeal process with patient and [place]. Both requested to appeal.  CM informed again by RN that patient has decided not to appeal discharge and will go home. CM verified with patient and he verbalized not to appeal discharge.  CM offered patient cab voucher if his transportation is not available. Patient states his brother, Onalee Hua will transport.  Cab voucher available if needed.   No further TOC needs noted.        Final next level of care: Home/Self Care Barriers to Discharge: Barriers Resolved   Patient Goals and CMS Choice Patient states their goals for this hospitalization and ongoing recovery are:: To return home CMS Medicare.gov Compare Post Acute Care list provided to:: Patient Choice offered to / list presented to : NA      Discharge Placement                Patient to be transferred to facility by: Mother Name of family member notified: [facility]    Discharge Plan and Services Additional resources added to the After Visit Summary for                  DME Arranged: N/A DME Agency: NA       HH Arranged: NA HH  Agency: NA        Social Drivers of Health (SDOH) Interventions SDOH Screenings   Food Insecurity: No Food Insecurity (07/30/2023)  Housing: Low Risk  (07/30/2023)  Transportation Needs: No Transportation Needs (07/30/2023)  Utilities: Not At Risk (07/30/2023)  Alcohol Screen: Low Risk  (12/15/2022)  Depression (PHQ2-9): Low Risk  (05/30/2023)  Financial Resource Strain: Low Risk  (12/15/2022)  Physical Activity: Insufficiently Active (12/15/2022)  Social Connections: Socially Isolated (12/15/2022)  Stress: No Stress Concern Present (12/15/2022)  Tobacco Use: Low Risk  (07/30/2023)  Health Literacy: Adequate Health Literacy (12/15/2022)     Readmission Risk Interventions    07/31/2023    2:02 PM  Readmission Risk Prevention Plan  Transportation Screening Complete  PCP or Specialist Appt within 3-5 Days Complete  HRI or Home Care Consult Complete  Social Work Consult for Recovery Care Planning/Counseling Complete  Palliative Care Screening Not Applicable  Medication Review Oceanographer) Referral to Pharmacy

## 2023-08-02 NOTE — Progress Notes (Signed)
 Mobility Specialist Progress Note:    08/02/23 0900  Mobility  Activity Ambulated with assistance in hallway  Level of Assistance Standby assist, set-up cues, supervision of patient - no hands on  Assistive Device Front wheel walker  Distance Ambulated (ft) 75 ft  Activity Response Tolerated well  Mobility Referral Yes  Mobility visit 1 Mobility  Mobility Specialist Start Time (ACUTE ONLY) [id number]  Mobility Specialist Stop Time (ACUTE ONLY) 0858  Mobility Specialist Time Calculation (min) (ACUTE ONLY) 7 min   Pt received in bed and agreeable. C/o fatigue this date and lack of sleep d/t persistent cough. Required no physical assist and returned to room w/o fault. Pt left in bed with call bell and all needs met.  Tyler Alvarez Mobility Specialist Please contact via Special educational needs teacher or Rehab office at [phone]

## 2023-08-02 NOTE — Discharge Summary (Signed)
 Physician Discharge Summary  Tyler Alvarez BJY:[id number] DOB: [birth year] DOA: 07/30/2023  PCP: Hilbert Bible, FNP  Admit date: 07/30/2023 Discharge date: 08/02/2023  Admitted From: Home Discharge disposition: Home  Recommendations at discharge:  Complete rest of the course of Tamiflu at home Continue Mucinex for cough Stop losartan till next visit with your nephrologist  Brief narrative: Tyler Alvarez is a 48 y.o. male with PMH significant for HTN, AAA without rupture, renal failure s/p transplant 2001 on chronic immunosuppressants, tuberous sclerosis, and seizure disorder  3/17, patient presented to the ED with cough sore throat, congestion, nausea and vomiting as well as severe fatigue.  He had a fever of 102.5  Tested positive for influenza A.  Chest x-ray without pneumonia Admitted to [facility]  Subjective: Patient was seen and examined this morning. Pleasant middle-aged Caucasian male.  Looks older than his stated age. Feels better than at presentation.  Cough improving.  I also called and updated his mother. Ready for discharge home today.  Hospital course: Influenza A Started on a 5-day course of Tamiflu.   Clinically improving to complete rest of the course of Tamiflu at home Cough improved.  Continue Mucinex twice daily  AKI on CKD stage IIIb  renal transplant status Baseline creatinine 2.6-2.8.  Presented with creatinine elevated to 3.18.  CT abdomen without evidence of obstruction.  Creatinine improved back to baseline with hydration. Nephrology following. Continue his usual immunosuppressive agents: Prednisone, Prograf Follows at [facility] Recent Labs    04/25/23 1249 04/27/23 1410 07/30/23 0907 07/30/23 2131 08/01/23 0826  BUN 29* 32* 38* 31* 24*  CREATININE 2.48* 2.76* 3.18* 2.91* 2.42*   Hyperkalemia Hypomagnesemia Potassium level elevated due to AKI.  Improved with Lokelma.   Magnesium level low.  Discussed with nephrologist.   Given mag oxide 400 mg twice daily X 2 doses Recent Labs  Lab 07/30/23 0907 07/30/23 2131 08/01/23 0826  K 5.5* 4.6 4.6  MG  --  1.6* 1.4*  PHOS  --  3.9 2.8   Anemia of chronic kidney disease No evidence of acute blood loss.  Renal panel was obtained.  Level is adequate. Recent Labs    04/25/23 1249 04/27/23 1410 07/30/23 0907 07/30/23 2131 08/01/23 0826 08/02/23 0458  HGB 11.5* 10.5* 12.1* 11.4* 10.5*  --   MCV 87.5 88.1 88.7 90.2 89.4  --   VITAMINB12  --   --   --   --   --  797  FOLATE  --   --   --   --   --  8.2  FERRITIN  --   --   --   --   --  97  TIBC  --   --   --   --   --  223*  IRON  --   --   --   --   --  88  RETICCTPCT  --   --   --   --   --  0.9   HTN Currently controlled on amlodipine Losartan on hold because of AKI.  Blood pressure is stable with amlodipine only.  Continue the same at home.  Keep losartan on hold till next visit with nephrology.   AAA without rupture Asymptomatic    Mobility: Encourage ambulation with mobility  Goals of care   Code Status: Full Code   Family Communication: Spoke with patient's mom on the phone this morning.  Diet:  Diet Order  Diet general           Diet regular Fluid consistency: Thin  Diet effective now                   Nutritional status:  Body mass index is 16 kg/m.       Wounds:  -    Discharge Exam:   Vitals:   08/01/23 0907 08/01/23 1704 08/01/23 2022 08/02/23 0850  BP: (!) 142/83 126/78 126/84 114/71  Pulse: 72 98 72 69  Resp: 18 18 18 18   Temp:   98.3 F (36.8 C)   TempSrc:      SpO2: 97% 97% 98% 98%  Weight:      Height:        Body mass index is 16 kg/m.   General exam: Pleasant, middle-aged Caucasian male.  Looks older for his age Skin: No rashes, lesions or ulcers. HEENT: Atraumatic, normocephalic, no obvious bleeding Lungs: Clear to auscultation bilaterally.  Improving cough. CVS: S1, S2, no murmur,   GI/Abd: Soft, nontender, nondistended,  bowel sound present,   CNS: Alert, awake, oriented x 3 Psychiatry: Mood appropriate,  Extremities: No pedal edema, no calf tenderness,   Follow ups:    Follow-up Information     Caudle, [name], FNP Follow up.   Specialty: Family Medicine Contact information: [address] [place] [address] [phone]                 Discharge Instructions:   Discharge Instructions     Call MD for:  difficulty breathing, headache or visual disturbances   Complete by: As directed    Call MD for:  extreme fatigue   Complete by: As directed    Call MD for:  hives   Complete by: As directed    Call MD for:  persistant dizziness or light-headedness   Complete by: As directed    Call MD for:  persistant nausea and vomiting   Complete by: As directed    Call MD for:  severe uncontrolled pain   Complete by: As directed    Call MD for:  temperature >100.4   Complete by: As directed    Diet general   Complete by: As directed    Discharge instructions   Complete by: As directed    Recommendations at discharge:   Complete rest of the course of Tamiflu at home  Continue Mucinex for cough  Stop losartan till next visit with your nephrologist  General discharge instructions: Follow with Primary MD Haywood Lasso, [name], FNP in 7 days  Please request your PCP  to go over your hospital tests, procedures, radiology results at the follow up. Please get your medicines reviewed and adjusted.  Your PCP may decide to repeat certain labs or tests as needed. Do not drive, operate heavy machinery, perform activities at heights, swimming or participation in water activities or provide baby sitting services if your were admitted for syncope or siezures until you have seen by Primary MD or a Neurologist and advised to do so again. [place] Controlled Substance Reporting System database was reviewed. Do not drive, operate heavy machinery, perform activities at  heights, swim, participate in water activities or provide baby-sitting services while on medications for pain, sleep and mood until your outpatient physician has reevaluated you and advised to do so again.  You are strongly recommended to comply with the dose, frequency and duration of prescribed medications. Activity: As tolerated with Full fall precautions  use walker/cane & assistance as needed Avoid using any recreational substances like cigarette, tobacco, alcohol, or non-prescribed drug. If you experience worsening of your admission symptoms, develop shortness of breath, life threatening emergency, suicidal or homicidal thoughts you must seek medical attention immediately by calling 911 or calling your MD immediately  if symptoms less severe. You must read complete instructions/literature along with all the possible adverse reactions/side effects for all the medicines you take and that have been prescribed to you. Take any new medicine only after you have completely understood and accepted all the possible adverse reactions/side effects.  Wear Seat belts while driving. You were cared for by a hospitalist during your hospital stay. If you have any questions about your discharge medications or the care you received while you were in the hospital after you are discharged, you can call the unit and ask to speak with the hospitalist or the covering physician. Once you are discharged, your primary care physician will handle any further medical issues. Please note that NO REFILLS for any discharge medications will be authorized once you are discharged, as it is imperative that you return to your primary care physician (or establish a relationship with a primary care physician if you do not have one).   Increase activity slowly   Complete by: As directed        Discharge Medications:   Allergies as of 08/02/2023       Reactions   Erythromycin Nausea And Vomiting   Sulfa Antibiotics Itching, Nausea And  Vomiting        Medication List     STOP taking these medications    losartan 25 MG tablet Commonly known as: COZAAR       TAKE these medications    acetaminophen 500 MG tablet Commonly known as: TYLENOL Take 1,000 mg by mouth daily as needed for mild pain, headache or fever.   amLODipine 5 MG tablet Commonly known as: NORVASC Take 5 mg by mouth daily.   brompheniramine-pseudoephedrine 1-15 MG/5ML Elix Commonly known as: DIMETAPP Take 20 mLs by mouth daily as needed for allergies (Cold).   gabapentin 300 MG capsule Commonly known as: NEURONTIN Take 1 capsule (300 mg total) by mouth at bedtime.   guaiFENesin 600 MG 12 hr tablet Commonly known as: MUCINEX Take 1 tablet (600 mg total) by mouth 2 (two) times daily for 7 days.   ondansetron 4 MG disintegrating tablet Commonly known as: ZOFRAN-ODT Take 1 tablet (4 mg total) by mouth every 8 (eight) hours as needed for nausea or vomiting.   oseltamivir 30 MG capsule Commonly known as: TAMIFLU Take 1 capsule (30 mg total) by mouth daily for 2 days.   predniSONE 5 MG tablet Commonly known as: DELTASONE Take 5 mg by mouth daily.   tacrolimus 1 MG capsule Commonly known as: PROGRAF Take 2 mg by mouth 2 (two) times daily.         The results of significant diagnostics from this hospitalization (including imaging, microbiology, ancillary and laboratory) are listed below for reference.    Procedures and Diagnostic Studies:   DG CHEST PORT 1 VIEW Result Date: 07/30/2023 CLINICAL DATA:  Influenza a. EXAM: PORTABLE CHEST 1 VIEW COMPARISON:  04/27/2023. FINDINGS: The heart size and mediastinal contours are within normal limits. There is atherosclerotic calcification of the aorta. No consolidation, effusion, or pneumothorax. No acute osseous abnormality. IMPRESSION: No active disease. Electronically Signed   By: Thornell Sartorius M.D.   On: 07/30/2023 23:42   CT ABDOMEN  PELVIS WO CONTRAST Result Date: 07/30/2023 CLINICAL  DATA:  Abdominal pain, acute, nonlocalized. Nausea, vomiting and abdominal pain. EXAM: CT ABDOMEN AND PELVIS WITHOUT CONTRAST TECHNIQUE: Multidetector CT imaging of the abdomen and pelvis was performed following the standard protocol without IV contrast. RADIATION DOSE REDUCTION: This exam was performed according to the departmental dose-optimization program which includes automated exposure control, adjustment of the mA and/or kV according to patient size and/or use of iterative reconstruction technique. COMPARISON:  CT scan abdomen and pelvis from 07/29/2022. FINDINGS: Lower chest: There are subpleural atelectatic changes in the visualized lung bases. No overt consolidation. No pleural effusion. The heart is normal in size. No pericardial effusion. Hepatobiliary: The liver is normal in size. Non-cirrhotic configuration. No suspicious mass. No intrahepatic or extrahepatic bile duct dilation. Gallbladder is distended likely due to fasting status. There is a single sub 5 mm calcified gallstone without imaging signs of acute cholecystitis. No abnormal gallbladder wall thickening or pericholecystic fat stranding. Pancreas: Unremarkable. No pancreatic ductal dilatation or surrounding inflammatory changes. Spleen: Within normal limits. No focal lesion. Adrenals/Urinary Tract: Adrenal glands are unremarkable. Surgically absent bilateral kidneys. There is a transplanted left pelvic kidney. No hydronephrosis/hydroureter noted in the transplant kidney. There are several subcentimeter sized hypoattenuating foci in the kidney, which appears similar to the prior study and favored to represent proteinaceous/hemorrhagic cysts. There is also a partially exophytic simple cyst arising from the lower pole measuring up to 2.3 x 2.6 cm. Unremarkable urinary bladder. Stomach/Bowel: No disproportionate dilation of the small or large bowel loops. No evidence of abnormal bowel wall thickening or inflammatory changes. The appendix was not  visualized; however there is no acute inflammatory process in the right lower quadrant. Vascular/Lymphatic: No ascites or pneumoperitoneum. No abdominal or pelvic lymphadenopathy, by size criteria. No aneurysmal dilation of the major abdominal arteries. There are mild peripheral atherosclerotic vascular calcifications of the aorta and its major branches. Reproductive: Normal size prostate. Symmetric seminal vesicles. Other: The visualized soft tissues and abdominal wall are unremarkable. Musculoskeletal: No suspicious osseous lesions. IMPRESSION: 1. No acute inflammatory process identified within the abdomen or pelvis. 2. Multiple other nonacute observations, as described above. Aortic Atherosclerosis (ICD10-I70.0). Electronically Signed   By: Jules Schick M.D.   On: 07/30/2023 13:35     Labs:   Basic Metabolic Panel: Recent Labs  Lab 07/30/23 0907 07/30/23 2131 08/01/23 0826  NA 135 139 134*  K 5.5* 4.6 4.6  CL 104 110 107  CO2 20* 20* 19*  GLUCOSE 96 103* 89  BUN 38* 31* 24*  CREATININE 3.18* 2.91* 2.42*  CALCIUM 9.4 8.0* 8.5*  MG  --  1.6* 1.4*  PHOS  --  3.9 2.8   GFR Estimated Creatinine Clearance: 27.9 mL/min (A) (by C-G formula based on SCr of 2.42 mg/dL (H)). Liver Function Tests: Recent Labs  Lab 07/30/23 0907 07/30/23 2131 08/01/23 0826  AST 31 28  --   ALT 19 22  --   ALKPHOS 95 85  --   BILITOT 0.7 0.7  --   PROT 7.6 6.2*  --   ALBUMIN 4.4 3.1* 2.6*   Recent Labs  Lab 07/30/23 0907  LIPASE 35   No results for input(s): "AMMONIA" in the last 168 hours. Coagulation profile No results for input(s): "INR", "PROTIME" in the last 168 hours.  CBC: Recent Labs  Lab 07/30/23 0907 07/30/23 2131 08/01/23 0826  WBC 11.1* 6.3 3.4*  NEUTROABS 9.4*  --   --   HGB 12.1* 11.4* 10.5*  HCT 36.2* 34.9* 32.1*  MCV 88.7 90.2 89.4  PLT 147* 140* 133*   Cardiac Enzymes: No results for input(s): "CKTOTAL", "CKMB", "CKMBINDEX", "TROPONINI" in the last 168  hours. BNP: Invalid input(s): "POCBNP" CBG: No results for input(s): "GLUCAP" in the last 168 hours. D-Dimer No results for input(s): "DDIMER" in the last 72 hours. Hgb A1c Recent Labs    07/30/23 2131  HGBA1C 5.2   Lipid Profile No results for input(s): "CHOL", "HDL", "LDLCALC", "TRIG", "CHOLHDL", "LDLDIRECT" in the last 72 hours. Thyroid function studies Recent Labs    07/30/23 2131  TSH 0.686   Anemia work up Recent Labs    08/02/23 0458  VITAMINB12 797  FOLATE 8.2  FERRITIN 97  TIBC 223*  IRON 88  RETICCTPCT 0.9   Microbiology Recent Results (from the past 240 hours)  Resp panel by RT-PCR (RSV, Flu A&B, Covid) Anterior Nasal Swab     Status: Abnormal   Collection Time: 07/29/23  3:56 PM   Specimen: Anterior Nasal Swab  Result Value Ref Range Status   SARS Coronavirus 2 by RT PCR NEGATIVE NEGATIVE Final    Comment: (NOTE) SARS-CoV-2 target nucleic acids are NOT DETECTED.  The SARS-CoV-2 RNA is generally detectable in upper respiratory specimens during the acute phase of infection. The lowest concentration of SARS-CoV-2 viral copies this assay can detect is 138 copies/mL. A negative result does not preclude SARS-Cov-2 infection and should not be used as the sole basis for treatment or other patient management decisions. A negative result may occur with  improper specimen collection/handling, submission of specimen other than nasopharyngeal swab, presence of viral mutation(s) within the areas targeted by this assay, and inadequate number of viral copies(<138 copies/mL). A negative result must be combined with clinical observations, patient history, and epidemiological information. The expected result is Negative.  Fact Sheet for Patients:  BloggerCourse.com  Fact Sheet for Healthcare Providers:  SeriousBroker.it  This test is no t yet approved or cleared by the [place] FDA and  has been authorized  for detection and/or diagnosis of SARS-CoV-2 by FDA under an Emergency Use Authorization (EUA). This EUA will remain  in effect (meaning this test can be used) for the duration of the COVID-19 declaration under Section 564(b)(1) of the Act, 21 U.S.C.section 360bbb-3(b)(1), unless the authorization is terminated  or revoked sooner.       Influenza A by PCR POSITIVE (A) NEGATIVE Final   Influenza B by PCR NEGATIVE NEGATIVE Final    Comment: (NOTE) The Xpert Xpress SARS-CoV-2/FLU/RSV plus assay is intended as an aid in the diagnosis of influenza from Nasopharyngeal swab specimens and should not be used as a sole basis for treatment. Nasal washings and aspirates are unacceptable for Xpert Xpress SARS-CoV-2/FLU/RSV testing.  Fact Sheet for Patients: BloggerCourse.com  Fact Sheet for Healthcare Providers: SeriousBroker.it  This test is not yet approved or cleared by the [place] FDA and has been authorized for detection and/or diagnosis of SARS-CoV-2 by FDA under an Emergency Use Authorization (EUA). This EUA will remain in effect (meaning this test can be used) for the duration of the COVID-19 declaration under Section 564(b)(1) of the Act, 21 U.S.C. section 360bbb-3(b)(1), unless the authorization is terminated or revoked.     Resp Syncytial Virus by PCR NEGATIVE NEGATIVE Final    Comment: (NOTE) Fact Sheet for Patients: BloggerCourse.com  Fact Sheet for Healthcare Providers: SeriousBroker.it  This test is not yet approved or cleared by the [place] FDA and has been authorized for detection  and/or diagnosis of SARS-CoV-2 by FDA under an Emergency Use Authorization (EUA). This EUA will remain in effect (meaning this test can be used) for the duration of the COVID-19 declaration under Section 564(b)(1) of the Act, 21 U.S.C. section 360bbb-3(b)(1), unless the  authorization is terminated or revoked.  Performed at [facility], [address] [place], [place] [address]     Time coordinating discharge: 45 minutes  Signed: Melina Schools Milton Sagona  [facility] 08/02/2023, 10:56 AM

## 2023-08-02 NOTE — Progress Notes (Signed)
 Explained discharge instructions to patient. Reviewed follow up appointment and next medication administration times. Also reviewed education. Patient verbalized having an understanding for instructions given. All belongings are in the patient's possession. IV was removed by the patient's RN.  No other needs verbalized. Transporting downstairs to the discharge lounge.

## 2023-08-02 NOTE — Progress Notes (Signed)
 Nursing Discharge Note   Name: Tyler Alvarez MRN: [id number] DOB: [birth year]    Admit Date:  07/30/2023  Discharge Date:  08/02/2023   Tyler Alvarez is to be discharged home per MD order.  AVS completed. Reviewed with patient and family at bedside by Discharge Nurse Pervis Hocking RN. Highlighted copy provided for patient to take home.  Patient/caregiver able to verbalize understanding of discharge instructions. PIV removed. Patient stable upon discharge.   Discharge Instructions   None      Discharge Instructions     Call MD for:  difficulty breathing, headache or visual disturbances   Complete by: As directed    Call MD for:  extreme fatigue   Complete by: As directed    Call MD for:  hives   Complete by: As directed    Call MD for:  persistant dizziness or light-headedness   Complete by: As directed    Call MD for:  persistant nausea and vomiting   Complete by: As directed    Call MD for:  severe uncontrolled pain   Complete by: As directed    Call MD for:  temperature >100.4   Complete by: As directed    Diet general   Complete by: As directed    Discharge instructions   Complete by: As directed    Recommendations at discharge:   Complete rest of the course of Tamiflu at home  Continue Mucinex for cough  Stop losartan till next visit with your nephrologist  General discharge instructions: Follow with Primary MD Haywood Lasso, [name], FNP in 7 days  Please request your PCP  to go over your hospital tests, procedures, radiology results at the follow up. Please get your medicines reviewed and adjusted.  Your PCP may decide to repeat certain labs or tests as needed. Do not drive, operate heavy machinery, perform activities at heights, swimming or participation in water activities or provide baby sitting services if your were admitted for syncope or siezures until you have seen by Primary MD or a Neurologist and advised to do so again. [place] Controlled  Substance Reporting System database was reviewed. Do not drive, operate heavy machinery, perform activities at heights, swim, participate in water activities or provide baby-sitting services while on medications for pain, sleep and mood until your outpatient physician has reevaluated you and advised to do so again.  You are strongly recommended to comply with the dose, frequency and duration of prescribed medications. Activity: As tolerated with Full fall precautions use walker/cane & assistance as needed Avoid using any recreational substances like cigarette, tobacco, alcohol, or non-prescribed drug. If you experience worsening of your admission symptoms, develop shortness of breath, life threatening emergency, suicidal or homicidal thoughts you must seek medical attention immediately by calling 911 or calling your MD immediately  if symptoms less severe. You must read complete instructions/literature along with all the possible adverse reactions/side effects for all the medicines you take and that have been prescribed to you. Take any new medicine only after you have completely understood and accepted all the possible adverse reactions/side effects.  Wear Seat belts while driving. You were cared for by a hospitalist during your hospital stay. If you have any questions about your discharge medications or the care you received while you were in the hospital after you are discharged, you can call the unit and ask to speak with the hospitalist or the covering physician. Once you are discharged, your primary care physician will handle any further medical  issues. Please note that NO REFILLS for any discharge medications will be authorized once you are discharged, as it is imperative that you return to your primary care physician (or establish a relationship with a primary care physician if you do not have one).   Increase activity slowly   Complete by: As directed         Allergies as of 08/02/2023        Reactions   Erythromycin Nausea And Vomiting   Sulfa Antibiotics Itching, Nausea And Vomiting        Medication List     STOP taking these medications    losartan 25 MG tablet Commonly known as: COZAAR       TAKE these medications    acetaminophen 500 MG tablet Commonly known as: TYLENOL Take 1,000 mg by mouth daily as needed for mild pain, headache or fever.   amLODipine 5 MG tablet Commonly known as: NORVASC Take 5 mg by mouth daily.   brompheniramine-pseudoephedrine 1-15 MG/5ML Elix Commonly known as: DIMETAPP Take 20 mLs by mouth daily as needed for allergies (Cold).   gabapentin 300 MG capsule Commonly known as: NEURONTIN Take 1 capsule (300 mg total) by mouth at bedtime.   guaiFENesin 600 MG 12 hr tablet Commonly known as: MUCINEX Take 1 tablet (600 mg total) by mouth 2 (two) times daily for 7 days.   ondansetron 4 MG disintegrating tablet Commonly known as: ZOFRAN-ODT Take 1 tablet (4 mg total) by mouth every 8 (eight) hours as needed for nausea or vomiting.   oseltamivir 30 MG capsule Commonly known as: TAMIFLU Take 1 capsule (30 mg total) by mouth daily for 2 days.   predniSONE 5 MG tablet Commonly known as: DELTASONE Take 5 mg by mouth daily.   tacrolimus 1 MG capsule Commonly known as: PROGRAF Take 2 mg by mouth 2 (two) times daily.         Discharge Instructions/ Education: An After Visit Summary was printed and given to the patient. Discharge instructions given to patient/family with verbalized understanding. Discharge education completed with patient/family including: follow up instructions, medication list, discharge activities, and limitations if indicated.  Additional discharge instructions as indicated by discharging provider also reviewed.  Patient and family able to verbalize understanding, all questions fully answered. Patient instructed to return to Emergency Department, call 911, or call MD for any changes in condition.    Patient transported off unit to Discharge Lounge with mother via wheelchair to await ride home.

## 2023-08-03 ENCOUNTER — Telehealth: Payer: Self-pay

## 2023-08-03 LAB — TACROLIMUS LEVEL: Tacrolimus (FK506) - LabCorp: 4.6 ng/mL — ABNORMAL LOW (ref 5.0–20.0)

## 2023-08-03 NOTE — Transitions of Care (Post Inpatient/ED Visit) (Signed)
 08/03/2023  Name: Tyler Alvarez MRN: [id number] DOB: 1975-06-20  Today's TOC FU Call Status: Today's TOC FU Call Status:: Successful TOC FU Call Completed TOC FU Call Complete Date: 08/03/23 Patient's Name and Date of Birth confirmed.  Transition Care Management Follow-up Telephone Call Date of Discharge: 08/02/23 Discharge Facility: [facility]) Type of Discharge: Inpatient Admission Primary Inpatient Discharge Diagnosis:: Influenza A How have you been since you were released from the hospital?: Better Any questions or concerns?: No  Items Reviewed: Did you receive and understand the discharge instructions provided?: Yes Medications obtained,verified, and reconciled?: Yes (Medications Reviewed) Any new allergies since your discharge?: No Dietary orders reviewed?: NA Do you have support at home?: Yes People in Home: parent(s) Name of Support/Comfort Primary Source: mother  Medications Reviewed Today: Medications Reviewed Today     Reviewed by Earlie Server, RN (Registered Nurse) on 08/03/23 at 1242  Med List Status: <None>   Medication Order Taking? Sig Documenting Provider Last Dose Status Informant  acetaminophen (TYLENOL) 500 MG tablet [id number] Yes Take 1,000 mg by mouth daily as needed for mild pain, headache or fever. [provider] Taking Active Mother, Pharmacy Records           Med Note Lenoria Farrier   Tue May 10, 2021  2:15 PM)    amLODipine (NORVASC) 5 MG tablet [id number] Yes Take 5 mg by mouth daily. [provider] Taking Active Mother, Pharmacy Records  brompheniramine-pseudoephedrine [facility]) 1-15 MG/5ML Frederich Cha [id number] Yes Take 20 mLs by mouth daily as needed for allergies (Cold). [provider] Taking Active Mother, Pharmacy Records  gabapentin (NEURONTIN) 300 MG capsule [id number] Yes Take 1 capsule (300 mg total) by mouth at bedtime. Hilbert Bible, FNP Taking Active Mother, Pharmacy Records  guaiFENesin  (MUCINEX) 600 MG 12 hr tablet [id number] Yes Take 1 tablet (600 mg total) by mouth 2 (two) times daily for 7 days. Lorin Glass, MD Taking Active   ondansetron (ZOFRAN-ODT) 4 MG disintegrating tablet [id number] Yes Take 1 tablet (4 mg total) by mouth every 8 (eight) hours as needed for nausea or vomiting. Dolphus Jenny, PA-C Taking Active Mother, Pharmacy Records  oseltamivir (TAMIFLU) 30 MG capsule [id number] Yes Take 1 capsule (30 mg total) by mouth daily for 2 days. Lorin Glass, MD Taking Active   predniSONE (DELTASONE) 5 MG tablet [id number] Yes Take 5 mg by mouth daily.  [provider] Taking Active Mother, Pharmacy Records           Med Note Jola Schmidt   Wed Aug 01, 2023  1:16 PM)    tacrolimus (PROGRAF) 1 MG capsule [id number] Yes Take 2 mg by mouth 2 (two) times daily.  [provider] Taking Active Mother, Pharmacy Records            Home Care and Equipment/Supplies: Were Home Health Services Ordered?: NA Any new equipment or medical supplies ordered?: NA  Functional Questionnaire: Do you need assistance with bathing/showering or dressing?: No Do you need assistance with meal preparation?: Yes (mom) Do you need assistance with eating?: No Do you have difficulty maintaining continence: No Do you need assistance with getting out of bed/getting out of a chair/moving?: No Do you have difficulty managing or taking your medications?: No  Follow up appointments reviewed: PCP Follow-up appointment confirmed?: No (patient will call today and make an appointment) MD Provider Line Number:[phone] Given: No Specialist Hospital Follow-up appointment confirmed?: NA Do you need transportation to your follow-up appointment?: No Do  you understand care options if your condition(s) worsen?: Yes-patient verbalized understanding  SDOH Interventions Today    Flowsheet Row Most Recent Value  SDOH Interventions   Food Insecurity Interventions Intervention Not  Indicated  Housing Interventions Intervention Not Indicated  Transportation Interventions Intervention Not Indicated  Utilities Interventions Intervention Not Indicated      Interventions Today    Flowsheet Row Most Recent Value  Chronic Disease   Chronic disease during today's visit Other  [Influenza A]  General Interventions   General Interventions Discussed/Reviewed General Interventions Discussed, General Interventions Reviewed, Labs, Doctor Visits  Doctor Visits Discussed/Reviewed Doctor Visits Discussed, Doctor Visits Reviewed  Education Interventions   Education Provided Provided Education  [Encouraged patient to schedule his followup with PCP and then call his transportation services.  He agreed.]  Provided Verbal Education On Nutrition, Medication, When to see the doctor  Nutrition Interventions   Nutrition Discussed/Reviewed Nutrition Discussed, Fluid intake  [Encouraged patient to eat healthy and stay hydrated.]  Pharmacy Interventions   Pharmacy Dicussed/Reviewed Medications and their functions  [Encouraged patient to take his medications as prescribed.]      TOC Interventions Today    Flowsheet Row Most Recent Value  TOC Interventions   TOC Interventions Discussed/Reviewed TOC Interventions Discussed, TOC Interventions Reviewed, S/S of infection, Post discharge activity limitations per provider      Spoke with patient who reports that he is self managing well. Reports that he has all his medications and is taking them as prescribed.  Encouraged patient to avoid contact if he has a fever. Reviewed importance of good handwashing.  Reviewed and offer 30 day TOC program and patient declined. Provided my contact information if patient needs assistance in the future.   Lonia Chimera, RN, BSN, CEN [employer]- Transition of Care Team.  [facility] [phone]

## 2023-08-08 ENCOUNTER — Telehealth (HOSPITAL_BASED_OUTPATIENT_CLINIC_OR_DEPARTMENT_OTHER): Payer: Self-pay | Admitting: Family Medicine

## 2023-08-08 NOTE — Telephone Encounter (Signed)
 Pt's mother called about pt's recent hospital visit he was discharged on March 20th, but was recommended to follow up within 7 days. Please advise pt's mother on next steps

## 2023-08-09 NOTE — Telephone Encounter (Signed)
 Called and spoke with pt's mother Tyler Alvarez letting her know that we will keep pt's appt with PCP as scheduled and let her know that she needs to get pt's nephrology rescheduled ASAP. Shelby verbalized understanding. Nothing further needed.

## 2023-08-14 ENCOUNTER — Other Ambulatory Visit (HOSPITAL_BASED_OUTPATIENT_CLINIC_OR_DEPARTMENT_OTHER): Payer: Self-pay

## 2023-08-16 NOTE — Unmapped (Addendum)
 This pharmacist was notified by a technician that this patient has reported that they've missed 5 doses of their prograf.. I have reviewed the patient's medical record and have determined that no further pharmacist action is need. Information has been escalated to clinic staff for follow-up.      Approximate time spent: 0-5 minutes    Tera Helper, [facility], Clinical Specialty Pharmacist  [facility]        [facility] Special[facility] Home Delivery Pharm[facility] Refill Coordination Note    Specialty Medication(s) to be Shipped:   Transplant: Prograf 1mg     Other medication(s) to be shipped: No additional medications requested for fill at this time     Oscar [name], DOB: 1975-10-07  Phone: [phone] (home)       All above HIPAA information was verified with patient.     Was a Nurse, learning disability used for this call? No    Completed refill call assessment today to schedule patient's medication shipment from the [facility] and Home Delivery Pharmacy  [phone]).  All relevant notes have been reviewed.     Specialty medication(s) and dose(s) confirmed: Regimen is correct and unchanged.   Changes to medications: Oscar [name] reports no changes at this time.  Changes to insurance: No  New side effects reported not previously addressed with a pharmacist or physician: None reported  Questions for the pharmacist: No    Confirmed patient received a Conservation officer, historic buildings and a Surveyor, mining with first shipment. The patient will receive a drug information handout for each medication shipped and additional FDA Medication Guides as required.       DISEASE/MEDICATION-SPECIFIC INFORMATION        N/A    SPECIALTY MEDICATION ADHERENCE     Medication Adherence    Patient reported X missed doses in the last month: 5  Specialty Medication: PROGRAF 1 mg capsule (tacrolimus)  Patient is on additional specialty medications: No  Patient is on more than two specialty medications: No  Any gaps in refill history greater than 2 weeks in the last 3 months: no  Demonstrates understanding of importance of adherence: yes  Informant: patient  Adherence tools used: patient uses a pill box to manage medications  Support network for adherence: family member  Confirmed plan for next specialty medication refill: delivery by pharmacy  Refills needed for supportive medications: not needed          Refill Coordination    Has the Patients' Contact Information Changed: No  Is the Shipping Address Different: No         Were doses missed due to medication being on hold? No    PROGRAF 1   mg: 14 days of medicine on hand       REFERRAL TO PHARMACIST     Referral to the pharmacist: Not needed      [facility]     Shipping address confirmed in Epic.     Cost and Payment: Patient has a $0 copay, payment information is not required.    Delivery Scheduled: Yes, Expected medication delivery date: 08/24/23.     Medication will be delivered via UPS to the prescription address in Epic WAM.    Kerby Less   [facility] Special[facility] and Home Delivery Pharm[facility]  Specialty Technician

## 2023-08-17 NOTE — Unmapped (Signed)
 Called pt to check on him since being made aware by pharmacy that he'd missed 5 days of immunosuppression meds (Tacrolimus). Phone was picked up, but no one answered on the other end. I tried to call again and it just continued to ring. Epic records show Tacro is pending fill, but cost is $1279.33. Message sent to [facility] and [facility] rep for guidance to ensure patient will have his medications. Note indicates he has about 14 day supply on hand.

## 2023-08-23 MED FILL — PROGRAF 1 MG CAPSULE: ORAL | 30 days supply | Qty: 120 | Fill #1

## 2023-08-27 NOTE — Progress Notes (Signed)
 [facility] Quality Team Note  Name: Tyler Alvarez Date of Birth: 1975/09/12 MRN: [id number] Date: 08/27/2023  [facility] Quality Team has reviewed this patient's chart, please see recommendations below:  [facility] Quality Other; (PATIENT DUE FOR COLON SCREENING, PLEASE MAKE NOTE TO BE ADDRESSED AT UPCOMING APPT WITH PCP 10/15/2023)

## 2023-08-27 NOTE — Progress Notes (Signed)
 Patient noted for upcoming appt in June to discuss

## 2023-08-28 ENCOUNTER — Encounter (HOSPITAL_BASED_OUTPATIENT_CLINIC_OR_DEPARTMENT_OTHER): Payer: 59 | Admitting: Family Medicine

## 2023-09-12 DIAGNOSIS — Z940 Kidney transplant status: Secondary | ICD-10-CM | POA: Diagnosis not present

## 2023-09-14 NOTE — Unmapped (Signed)
 [facility] Specialty and Home Delivery Pharmacy Refill Coordination Note    Specialty Medication(s) to be Shipped:   Transplant: Prednisone 5mg  and Prograf 1mg     Other medication(s) to be shipped: No additional medications requested for fill at this time     Oscar [name], DOB: 29-Apr-1976  Phone: [phone] (home)       All above HIPAA information was verified with patient's family member, Oscar [name].     Was a Nurse, learning disability used for this call? No    Completed refill call assessment today to schedule patient's medication shipment from the [facility] and Home Delivery Pharmacy  [phone]).  All relevant notes have been reviewed.     Specialty medication(s) and dose(s) confirmed: Regimen is correct and unchanged.   Changes to medications: Oscar [name] reports no changes at this time.  Changes to insurance: No  New side effects reported not previously addressed with a pharmacist or physician: None reported  Questions for the pharmacist: No    Confirmed patient received a Conservation officer, historic buildings and a Surveyor, mining with first shipment. The patient will receive a drug information handout for each medication shipped and additional FDA Medication Guides as required.       DISEASE/MEDICATION-SPECIFIC INFORMATION        N/A    SPECIALTY MEDICATION ADHERENCE     Medication Adherence    Patient reported X missed doses in the last month: 2  Specialty Medication: PROGRAF 1 mg capsule (tacrolimus)  Patient is on additional specialty medications: Yes  Additional Specialty Medications: predniSONE 5 MG tablet (DELTASONE)  Patient Reported Additional Medication X Missed Doses in the Last Month: 1  Patient is on more than two specialty medications: No  Any gaps in refill history greater than 2 weeks in the last 3 months: no  Demonstrates understanding of importance of adherence: yes  Adherence tools used: patient uses a pill box to manage medications  Support network for adherence: family member              Were doses missed due to medication being on hold? No    predniSONE 5  mg: 7 days of medicine on hand   PROGRAF 1  mg: 9 days of medicine on hand       REFERRAL TO PHARMACIST     Referral to the pharmacist: Not needed      [facility]     Shipping address confirmed in Epic.     Cost and Payment: Patient has a $0 copay, payment information is not required.    Delivery Scheduled: Yes, Expected medication delivery date: 09/20/23.     Medication will be delivered via UPS to the prescription address in Epic WAM.    Stephen Ehrlich   [facility] Special[facility] and Home Delivery Pharm[facility]  Specialty Technician

## 2023-09-18 DIAGNOSIS — I10 Essential (primary) hypertension: Secondary | ICD-10-CM | POA: Diagnosis not present

## 2023-09-18 DIAGNOSIS — N2581 Secondary hyperparathyroidism of renal origin: Secondary | ICD-10-CM | POA: Diagnosis not present

## 2023-09-18 DIAGNOSIS — N184 Chronic kidney disease, stage 4 (severe): Secondary | ICD-10-CM | POA: Diagnosis not present

## 2023-09-18 DIAGNOSIS — D849 Immunodeficiency, unspecified: Secondary | ICD-10-CM | POA: Diagnosis not present

## 2023-09-18 DIAGNOSIS — I5189 Other ill-defined heart diseases: Secondary | ICD-10-CM | POA: Diagnosis not present

## 2023-09-18 DIAGNOSIS — I7121 Aneurysm of the ascending aorta, without rupture: Secondary | ICD-10-CM | POA: Diagnosis not present

## 2023-09-18 DIAGNOSIS — E559 Vitamin D deficiency, unspecified: Secondary | ICD-10-CM | POA: Diagnosis not present

## 2023-09-18 DIAGNOSIS — Z940 Kidney transplant status: Secondary | ICD-10-CM | POA: Diagnosis not present

## 2023-09-18 DIAGNOSIS — I517 Cardiomegaly: Secondary | ICD-10-CM | POA: Diagnosis not present

## 2023-09-19 MED FILL — PREDNISONE 5 MG TABLET: ORAL | 90 days supply | Qty: 90 | Fill #1

## 2023-09-19 MED FILL — PROGRAF 1 MG CAPSULE: ORAL | 30 days supply | Qty: 120 | Fill #2

## 2023-10-02 NOTE — Unmapped (Signed)
 09/18/2023 [facility] office visit note.

## 2023-10-15 ENCOUNTER — Telehealth (HOSPITAL_BASED_OUTPATIENT_CLINIC_OR_DEPARTMENT_OTHER): Payer: Self-pay | Admitting: *Deleted

## 2023-10-15 ENCOUNTER — Encounter (HOSPITAL_BASED_OUTPATIENT_CLINIC_OR_DEPARTMENT_OTHER): Payer: Self-pay | Admitting: *Deleted

## 2023-10-15 ENCOUNTER — Encounter (HOSPITAL_BASED_OUTPATIENT_CLINIC_OR_DEPARTMENT_OTHER): Payer: Self-pay | Admitting: Family Medicine

## 2023-10-15 ENCOUNTER — Ambulatory Visit (INDEPENDENT_AMBULATORY_CARE_PROVIDER_SITE_OTHER): Admitting: Family Medicine

## 2023-10-15 VITALS — BP 132/78 | HR 87 | Ht 72.0 in | Wt 113.8 lb

## 2023-10-15 DIAGNOSIS — Q851 Tuberous sclerosis: Secondary | ICD-10-CM

## 2023-10-15 DIAGNOSIS — Z1211 Encounter for screening for malignant neoplasm of colon: Secondary | ICD-10-CM | POA: Diagnosis not present

## 2023-10-15 DIAGNOSIS — I729 Aneurysm of unspecified site: Secondary | ICD-10-CM

## 2023-10-15 DIAGNOSIS — Z87898 Personal history of other specified conditions: Secondary | ICD-10-CM

## 2023-10-15 DIAGNOSIS — Z681 Body mass index (BMI) 19 or less, adult: Secondary | ICD-10-CM | POA: Diagnosis not present

## 2023-10-15 DIAGNOSIS — N1832 Chronic kidney disease, stage 3b: Secondary | ICD-10-CM

## 2023-10-15 DIAGNOSIS — R636 Underweight: Secondary | ICD-10-CM | POA: Diagnosis not present

## 2023-10-15 DIAGNOSIS — H6123 Impacted cerumen, bilateral: Secondary | ICD-10-CM | POA: Diagnosis not present

## 2023-10-15 DIAGNOSIS — Z0000 Encounter for general adult medical examination without abnormal findings: Secondary | ICD-10-CM | POA: Diagnosis not present

## 2023-10-15 NOTE — Telephone Encounter (Signed)
 Tyler Alvarez, please see message about the nutrition referral that was placed and advise.

## 2023-10-15 NOTE — Progress Notes (Signed)
 Subjective:   Tyler Alvarez 1975-12-22 10/15/2023  CC: Chief Complaint  Patient presents with   Annual Exam    Patient is here today for his physical. Patient has been complaining that his right ear has been bothering him.    HPI: Tyler Alvarez is a 48 y.o. male who presents for a routine health maintenance exam.    Tuberous Sclerosis:  Patient is accompanied by his mother to appt today and requesting several referrals. Patient has extensive medical hx of specialist management due to hx of tuberous sclerosis. He is s/p renal transplant and managed by [facility] and [facility] team who he sees annually. He has seen [facility] in the past month for follow up s/p AKI in March 2025 admission due to Influenza A.   He was previously established with [facility] for ascending aortic aneurysm surveillance. His last echocardiogram was 1/29/2024with mildly dilated aortic root measuring 4.2 cm and ascending aorta measuring 3.6 cm. He was recommended to follow up annually.    Patient has a hx of seizure's and was previously followed by Dr. Walden Guise with Neurology in [facility]. He states he has been taken off from Depakote  due to lack of seizure activity in the past 2-3 years but states he is having auras including sensation of "feeling absent, staring off". History is confirmed by his mother. Denies sensation of smells, vision changes, sounds. He would like to get back on his medication and establish with neurology due to concern of auras.   He is needing a dental referral.   NUTRITION CONCERN:  Patient's mother is concerned regarding his BMI and underweight status. He has a hx of nutritional deficiencies due to renal transplant and TS. Patient does not have a large appetite. He does not exercise regularly. He does eat meat and eats a meal during the day for lunch and dinner. He does not snack during the day. His mom reports he does not have an appetite. Snack will include  chips, cheetos, and candy. He is only taking Prednisone  5mg  daily. Patient has CKD IIIb and does not monitor renal diet or protein intake.   HEALTH SCREENINGS: - Vision Screening: up to date - Dental Visits: Recommended; list provided locally  - Testicular Exam: Declined - STD Screening: Declined - PSA (50+): Declined  No results found for: "PSA1", "PSA"   UNDERWEIGHT:    Wt Readings from Last 3 Encounters:  10/15/23 113 lb 12.8 oz (51.6 kg)  07/30/23 118 lb (53.5 kg)  07/29/23 118 lb (53.5 kg)      - Colonoscopy (45+): Previously ordered referral   Discussed with patient purpose of the colonoscopy is to detect colon cancer at curable precancerous or early stages  - AAA Screening: N/a   Men age 4-75 who have ever smoked - Lung Cancer screening with low-dose CT: Not applicable-  Adults age 55-80 who are current cigarette smokers or quit within the last 15 years. Must have 20 pack year history.   Depression and Anxiety Screen done today and results listed below:     10/15/2023    2:09 PM 05/30/2023    1:59 PM 12/15/2022    9:28 PM 06/20/2021    3:29 PM 06/16/2020   10:42 AM  Depression screen PHQ 2/9  Decreased Interest 0 0 0 0 0  Down, Depressed, Hopeless 0 0 0 0 0  PHQ - 2 Score 0 0 0 0 0  Altered sleeping 0 0  0 0  Tired, decreased  energy 0 0  0 1  Change in appetite 0 0  0 0  Feeling bad or failure about yourself  0 0  0 0  Trouble concentrating 0 0  0 0  Moving slowly or fidgety/restless 0 0  0 0  Suicidal thoughts 0 0  0 0  PHQ-9 Score 0 0  0 1  Difficult doing work/chores Not difficult at all Not difficult at all  Not difficult at all       10/15/2023    2:09 PM 05/30/2023    1:59 PM  GAD 7 : Generalized Anxiety Score  Nervous, Anxious, on Edge 0 0  Control/stop worrying 0 0  Worry too much - different things 0 0  Trouble relaxing 0 0  Restless 0 0  Easily annoyed or irritable 0 0  Afraid - awful might happen 0 0  Total GAD 7 Score 0 0  Anxiety Difficulty Not  difficult at all Not difficult at all    IMMUNIZATIONS:  - Tdap: Tetanus vaccination status reviewed: last tetanus booster within 10 years. - Influenza: Up to date - Prevnar: Recommended - Shingrix vaccine (50+): Recommended    Past medical history, surgical history, medications, allergies, family history and social history reviewed with patient today and changes made to appropriate areas of the chart.   Past Medical History:  Diagnosis Date   Abscess 03/22/2021   ADD (attention deficit disorder)    Anxiety    Ascending aortic aneurysm (HCC)    Benign skin lesion of neck    left neck cyst   Cellulitis 02/24/2021   Cellulitis and abscess of buttock    Chronic kidney disease    s/p transplant 2001   Community acquired pneumonia 01/03/2020   Family history of adverse reaction to anesthesia    Pt mother stated Father-"I think that he stopped breathing, they called a code but he came through okay"   GERD (gastroesophageal reflux disease)    Headache    Migraines   Heart murmur    Hypertension    Immune deficiency disorder (HCC)    Restless leg syndrome    Seizures (HCC)    treated by Dr. Walden Guise, no seizures in years and years   Sepsis (HCC) 02/25/2021   Sepsis without acute organ dysfunction (HCC)    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HCC)    Wears glasses     Past Surgical History:  Procedure Laterality Date   AV FISTULA PLACEMENT     KIDNEY TRANSPLANT  2001   LESION EXCISION WITH COMPLEX REPAIR Left 05/24/2021   Procedure: EXCISION BENIGN LESION NECK 9CM, LAYERED CLOSURE NECK 10CM;  Surgeon: Alger Infield, MD;  Location: [facility] OR;  Service: Plastics;  Laterality: Left;   MASS EXCISION Left 12/11/2016   Procedure: EXCISION OF BENIGN LESION OF THE NECK WITH LAYERED CLOSURE;  Surgeon: Alger Infield, MD;  Location: [facility] OR;  Service: Plastics;  Laterality: Left;   MASS EXCISION     neck x2   MASS EXCISION Bilateral 04/01/2018   Procedure: excision benign lesions left neck and right  and left cheek ( 4cm, 2cm, 1cm x3), layered closure neck <10cm, layered closure right cheek 1cm;  Surgeon: Alger Infield, MD;  Location: [facility] OR;  Service: Plastics;  Laterality: Bilateral;  left cheek lesion   MULTIPLE TOOTH EXTRACTIONS     NEPHRECTOMY     both removed in 1999, 3 months apart   REVISON OF ARTERIOVENOUS FISTULA Left 07/16/2014   Procedure: EXCISION ANEURYSMAL AREA OF LEFT ARTERIOVENOUS  FISTULA;  Surgeon: Margherita Shell, MD;  Location: [facility] OR;  Service: Vascular;  Laterality: Left;    Current Outpatient Medications on File Prior to Visit  Medication Sig   acetaminophen  (TYLENOL ) 500 MG tablet Take 1,000 mg by mouth daily as needed for mild pain, headache or fever.   amLODipine  (NORVASC ) 5 MG tablet Take 5 mg by mouth daily.   brompheniramine -pseudoephedrine (DIMETAPP) 1-15 MG/5ML ELIX Take 20 mLs by mouth daily as needed for allergies (Cold).   gabapentin  (NEURONTIN ) 300 MG capsule Take 1 capsule (300 mg total) by mouth at bedtime.   losartan  (COZAAR ) 25 MG tablet Take 12.5 mg by mouth daily.   ondansetron  (ZOFRAN -ODT) 4 MG disintegrating tablet Take 1 tablet (4 mg total) by mouth every 8 (eight) hours as needed for nausea or vomiting.   predniSONE  (DELTASONE ) 5 MG tablet Take 5 mg by mouth daily.    tacrolimus  (PROGRAF ) 1 MG capsule Take 2 mg by mouth 2 (two) times daily.    No current facility-administered medications on file prior to visit.    Allergies  Allergen Reactions   Erythromycin Nausea And Vomiting   Sulfa  Antibiotics Itching and Nausea And Vomiting     Social History   Socioeconomic History   Marital status: Single    Spouse name: Not on file   Number of children: Not on file   Years of education: Not on file   Highest education level: Not on file  Occupational History   Not on file  Tobacco Use   Smoking status: Never   Smokeless tobacco: Never  Vaping Use   Vaping status: Never Used  Substance and Sexual Activity   Alcohol use: No   Drug  use: No   Sexual activity: Yes  Other Topics Concern   Not on file  Social History Narrative   Caffeine one daily.  Monster every other day.  Lives at home with mom.  Education: 8th grade.   Single.  None children.     Social Drivers of Corporate investment banker Strain: Low Risk  (12/15/2022)   Overall Financial Resource Strain (CARDIA)    Difficulty of Paying Living Expenses: Not hard at all  Food Insecurity: No Food Insecurity (08/03/2023)   Hunger Vital Sign    Worried About Running Out of Food in the Last Year: Never true    Ran Out of Food in the Last Year: Never true  Transportation Needs: No Transportation Needs (08/03/2023)   PRAPARE - Administrator, Civil Service (Medical): No    Lack of Transportation (Non-Medical): No  Physical Activity: Insufficiently Active (12/15/2022)   Exercise Vital Sign    Days of Exercise per Week: 3 days    Minutes of Exercise per Session: 30 min  Stress: No Stress Concern Present (12/15/2022)   [employer] of Occupational Health - Occupational Stress Questionnaire    Feeling of Stress : Not at all  Social Connections: Socially Isolated (12/15/2022)   Social Connection and Isolation Panel [NHANES]    Frequency of Communication with Friends and Family: More than three times a week    Frequency of Social Gatherings with Friends and Family: Three times a week    Attends Religious Services: Never    Active Member of Clubs or Organizations: No    Attends Banker Meetings: Never    Marital Status: Never married  Intimate Partner Violence: Not At Risk (08/03/2023)   Humiliation, Afraid, Rape, and Kick questionnaire  Fear of Current or Ex-Partner: No    Emotionally Abused: No    Physically Abused: No    Sexually Abused: No   Social History   Tobacco Use  Smoking Status Never  Smokeless Tobacco Never   Social History   Substance and Sexual Activity  Alcohol Use No     Family History  Problem Relation Age of  Onset   Hypothyroidism Mother    Cancer Mother 55       cervical   Hypertension Mother    COPD Mother    COPD Father 61       COPD   Ulcers Father    COPD Brother    Heart disease Brother    Cancer Other      ROS: Denies fever, fatigue, unexplained weight loss/gain, CP, SHOB, and palpatitations. Denies neurological deficits, gastrointestinal and/or genitourinary complaints, and skin changes.   Objective:   Today's Vitals   10/15/23 1406  BP: 132/78  Pulse: 87  SpO2: 99%  Weight: 113 lb 12.8 oz (51.6 kg)  Height: 6' (1.829 m)    GENERAL APPEARANCE: Thin, underweight, in NAD.  SKIN: Pink, warm and dry. Turgor normal. No rash, ulceration, or ecchymoses. Hair evenly distributed. Multiple cysts and comedones present to neck and entire back.  See picture.  HEENT: HEAD: Normocephalic.  EYES: PERRLA. EOMI. Lids intact w/o defect. Sclera white, Conjunctiva pink w/o exudate.  EARS: Cerumen impacted bilaterally. Unable to assess appropriate landmarks pre irritation. Post-irrigation: External ear w/o redness, swelling, masses or lesions. EAC clear. TM's intact, translucent w/o bulging, appropriate landmarks visualized. Appropriate acuity to conversational tones.  NOSE: Septum midline w/o deformity. Nares patent, mucosa pink and non-inflamed w/o drainage. No sinus tenderness.  THROAT: Poor dentition Uvula midline. Oropharynx clear. Tonsils non-inflamed w/o exudate. Oral mucosa pink and moist.  NECK: Supple, Trachea midline. Full ROM w/o pain or tenderness. No lymphadenopathy. Thyroid non-tender w/o enlargement or palpable masses.  RESPIRATORY: Chest wall symmetrical w/o masses. Respirations even and non-labored. Breath sounds clear to auscultation bilaterally. No wheezes, rales, rhonchi, or crackles. CARDIAC: S1, S2 present, regular rate and rhythm. No gallops, rubs, or clicks.  Grade II systolic murmur present. PMI w/o lifts, heaves, or thrills. No carotid bruits. Capillary refill <2  seconds. Peripheral pulses 2+ bilaterally. GI: Abdomen soft w/o distention. Normoactive bowel sounds. No palpable masses or tenderness. No guarding or rebound tenderness. Liver and spleen w/o tenderness or enlargement. No CVA tenderness.  GU: Pt deferred exam. MSK: Muscle tone and strength appropriate for age, w/o atrophy or abnormal movement. EXTREMITIES: Active ROM intact, w/o tenderness, crepitus, or contracture. No obvious joint deformities or effusions. No clubbing, edema, or cyanosis.  NEUROLOGIC: CN's II-XII intact. Motor strength symmetrical with no obvious weakness. No sensory deficits. DTR 2+ symmetric bilaterally. Steady, even gait.  PSYCH/MENTAL STATUS: Alert, oriented x 3. Cooperative, appropriate mood and affect.       Ear Cerumen Removal  Date/Time: 10/15/2023 5:04 PM  Performed by: Nonda Bays, FNP Authorized by: Nonda Bays, FNP   Anesthesia: Local Anesthetic: none Location details: right ear and left ear Patient tolerance: patient tolerated the procedure well with no immediate complications Procedure type: irrigation  Sedation: Patient sedated: no   Indication: Cerumen impaction of the ear(s)  Medical necessity statement: On physical examination, cerumen impairs clinically significant portions of the external auditory canal, and tympanic membrane. Noted obstructive, copious cerumen that cannot be removed without magnification and instrumentations requiring physician skills Consent: Discussed benefits and risks of procedure  and verbal consent obtained Procedure: Patient was prepped for the procedure. Utilized an otoscope to assess and take note of the ear canal, the tympanic membrane, and the presence, amount, and placement of the cerumen. Gentle water irrigation and soft plastic curette was utilized to remove cerumen.  Post procedure examination: shows cerumen was completely removed. Patient tolerated procedure well. The patient is made aware that  they may experience temporary vertigo, temporary hearing loss, and temporary discomfort. If these symptom last for more than 24 hours to call the clinic or proceed to the ED.   Assessment & Plan:   1. Annual physical exam (Primary) Discussed preventative screenings, recommended obtaining vaccines of Shingrix and Prevnar 20. Discussed dietary recommendations and exercise.   2. Tuberous sclerosis [facility]) Multispecialty management ongoing. Referral placed to Dermatology for chronic cystic formation and need for likely I&D and longterm management.  - Ambulatory referral to Dermatology  3. Screening for colon cancer - Ambulatory referral to Gastroenterology  4. Aneurysm [facility]) Referral to [facility] to establish care given tuberous sclerosis dx and need for surveillance for ascending aortic aneurysm. HTN well controlled currently. Will obtain recent labs from Nephrology for review.  - Ambulatory referral to Cardiology  5. Underweight (BMI < 18.5) PCP had lengthy discussion regarding renal diet, protein monitoring and weight concerns s/p transplant. Recommend nutrition counseling. Discussed benefits of SSRI to increase appetite. Mom and patient would like to try nutrition counseling first given CKD.  - Referral to Nutrition and Diabetes Services  6. History of seizures No reported seizure activity currently. Given dx of TS and hx of seizures, referral placed per patient request to discuss aura and possibly restart depakote  if needed.  - Ambulatory referral to Neurology  7. Stage 3b chronic kidney disease (HCC) Will obtain records for review from [facility]. Referral to nutrition also placed and discussed renal diet.  - Referral to Nutrition and Diabetes Services  8. Bilateral impacted cerumen Pt tolerated removal well and prevention discussed.    Orders Placed This Encounter  Procedures   Ambulatory referral to Gastroenterology    Referral Priority:   Routine    Referral Type:    Consultation    Referral Reason:   Specialty Services Required    Number of Visits Requested:   1   Ambulatory referral to Cardiology    Referral Priority:   Routine    Referral Type:   Consultation    Referral Reason:   Specialty Services Required    Number of Visits Requested:   1   Ambulatory referral to Dermatology    Referral Priority:   Routine    Referral Type:   Consultation    Referral Reason:   Specialty Services Required    Requested Specialty:   Dermatology    Number of Visits Requested:   1   Referral to Nutrition and Diabetes Services    Referral Priority:   Routine    Referral Type:   Consultation    Referral Reason:   Specialty Services Required    Number of Visits Requested:   1   Ambulatory referral to Neurology    Referral Priority:   Routine    Referral Type:   Consultation    Referral Reason:   Specialty Services Required    Requested Specialty:   Neurology    Number of Visits Requested:   1    PATIENT COUNSELING: - Encouraged to adjust caloric intake to maintain or achieve ideal body weight, to reduce intake of  dietary saturated fat and total fat, to limit sodium intake by avoiding high sodium foods and not adding table salt, and to maintain adequate dietary potassium and calcium preferably from fresh fruits, vegetables, and low-fat dairy products.   - Advised to avoid cigarette smoking. - Discussed with the patient that most people either abstain from alcohol or drink within safe limits (<=14/week and <=4 drinks/occasion for males, <=7/weeks and <= 3 drinks/occasion for females) and that the risk for alcohol disorders and other health effects rises proportionally with the number of drinks per week and how often a drinker exceeds daily limits. - Discussed cessation/primary prevention of drug use and availability of treatment for abuse.   - Stressed the importance of regular exercise - Injury prevention: Discussed safety belts, safety helmets, smoke detector,  smoking near bedding or upholstery.  - Dental health: Discussed importance of regular tooth brushing, flossing, and dental visits.  - Sexuality: Discussed sexually transmitted diseases, partner selection, use of condoms, avoidance of unintended pregnancy  and contraceptive alternatives.   NEXT PREVENTATIVE PHYSICAL DUE IN 1 YEAR.  Return in about 3 months (around 01/15/2024) for Follow up on Weight (underweight) .  Patient to reach out to office if new, worrisome, or unresolved symptoms arise or if no improvement in patient's condition. Patient verbalized understanding and is agreeable to treatment plan. All questions answered to patient's satisfaction.    Nonda Bays, [place]

## 2023-10-15 NOTE — Patient Instructions (Addendum)
 Look into early vaccination of Shingles and Pneumonia vaccine    If you have not heard from [facility] in the next week, please reach out to the office.

## 2023-10-15 NOTE — Telephone Encounter (Signed)
 Copied from CRM [id number]. Topic: Referral - Status >> Oct 15, 2023  3:12 PM Fredrica W wrote: Reason for CRM:  Jesslyn Moro called from [facility]. Received referral for pain. Patient has [facility] Medicare as primary. When Medicare primary referral has to list either daibetes or chronic kidney stage 3 or higher in order to be covered by insurance. Than this be added to referral so insurance will cover. Thank You

## 2023-10-17 ENCOUNTER — Telehealth (HOSPITAL_BASED_OUTPATIENT_CLINIC_OR_DEPARTMENT_OTHER): Payer: Self-pay | Admitting: *Deleted

## 2023-10-17 ENCOUNTER — Other Ambulatory Visit (HOSPITAL_BASED_OUTPATIENT_CLINIC_OR_DEPARTMENT_OTHER): Payer: Self-pay | Admitting: Family Medicine

## 2023-10-17 DIAGNOSIS — R630 Anorexia: Secondary | ICD-10-CM

## 2023-10-17 DIAGNOSIS — R636 Underweight: Secondary | ICD-10-CM

## 2023-10-17 MED ORDER — MIRTAZAPINE 7.5 MG PO TABS: 7.5000 mg | ORAL_TABLET | Freq: Every day | ORAL | 3 refills | Status: DC

## 2023-10-17 NOTE — Telephone Encounter (Signed)
 Copied from CRM [id number]. Topic: Clinical - Medication Question >> Oct 17, 2023 11:21 AM DeAngela L wrote: Reason for CRM: Patient mother called about medication the Dr was going to call in a prescription for the patient after his appt Monday 10/15/23 and the pharmacy has no prescription available yet after she called this morning  Pt number [phone] (H)  [id number] - [place],  - [id number] [place] [address] Phone: [phone] Fax: [phone]

## 2023-10-17 NOTE — Telephone Encounter (Signed)
 LVM for pt to call the office please see provider note below

## 2023-10-17 NOTE — Telephone Encounter (Signed)
**Note De-identified  [place] Obfuscation** Please advise 

## 2023-10-19 NOTE — Unmapped (Signed)
 [name] has been contacted in regards to their refill of PROGRAF 1 mg capsule (tacrolimus). At this time, they have declined refill due to patient having 2-3 weeks on hand. Refill assessment call date has been updated per the patient's request.

## 2023-10-30 ENCOUNTER — Telehealth (HOSPITAL_BASED_OUTPATIENT_CLINIC_OR_DEPARTMENT_OTHER): Payer: Self-pay | Admitting: *Deleted

## 2023-10-30 NOTE — Telephone Encounter (Signed)
 Please have patient call around to local Dermatologist and if he is able to find one that can see him sooner please let us  know and we will change the referral

## 2023-10-30 NOTE — Telephone Encounter (Signed)
 Copied from CRM [id number]. Topic: Referral - Status >> Oct 30, 2023  4:26 PM Albertha Alosa wrote: Reason for CRM: Patient mom called in stated they received a call from the dermatologist and stated they wouldn't be able to see patient until January would like to for the referral to be sent to another location to see if patient can be seen sooner

## 2023-10-31 NOTE — Unmapped (Signed)
 [facility] Refill Coordination Note    Specialty Medication(s) to be Shipped:   Transplant: Prograf 1mg     Other medication(s) to be shipped: No additional medications requested for fill at this time     Oscar [name], DOB: April 05, 1976  Phone: [phone] (home)       All above HIPAA information was verified with patient's family member, Spoke to Oscar [name] mom today..     Was a translator used for this call? No    Completed refill call assessment today to schedule patient's medication shipment from the [facility]  [phone]).  All relevant notes have been reviewed.     Specialty medication(s) and dose(s) confirmed: Regimen is correct and unchanged.   Changes to medications: Oscar [name] reports no changes at this time.  Changes to insurance: No  New side effects reported not previously addressed with a pharmacist or physician: None reported  Questions for the pharmacist: No    Confirmed patient received a Conservation officer, historic buildings and a Surveyor, mining with first shipment. The patient will receive a drug information handout for each medication shipped and additional FDA Medication Guides as required.       DISEASE/MEDICATION-SPECIFIC INFORMATION        N/A    SPECIALTY MEDICATION ADHERENCE     Medication Adherence    Patient reported X missed doses in the last month: 2  Specialty Medication: Prograf 1mg   Patient is on additional specialty medications: No  Adherence tools used: patient uses a pill box to manage medications  Support network for adherence: family member              Were doses missed due to medication being on hold? No    Prograf 1 mg: 10 days of medicine on hand     REFERRAL TO PHARMACIST     Referral to the pharmacist: Not needed      [facility]     Shipping address confirmed in Epic.     Cost and Payment: Patient has a $0 copay, payment information is not required.    Delivery Scheduled: Yes, Expected medication delivery date: 11/07/23.     Medication will be delivered via UPS to the prescription address in Epic WAM.    Oscar [name], [facility]   [facility]  Specialty Pharmacist

## 2023-10-31 NOTE — Telephone Encounter (Signed)
 Lvm informing patient of next steps for derm. referral

## 2023-11-05 ENCOUNTER — Encounter: Payer: Self-pay | Admitting: Gastroenterology

## 2023-11-06 MED FILL — PROGRAF 1 MG CAPSULE: ORAL | 30 days supply | Qty: 120 | Fill #3

## 2023-11-08 ENCOUNTER — Other Ambulatory Visit (HOSPITAL_BASED_OUTPATIENT_CLINIC_OR_DEPARTMENT_OTHER): Payer: Self-pay | Admitting: Family Medicine

## 2023-11-08 DIAGNOSIS — R630 Anorexia: Secondary | ICD-10-CM

## 2023-11-08 DIAGNOSIS — R636 Underweight: Secondary | ICD-10-CM

## 2023-11-14 ENCOUNTER — Ambulatory Visit: Admitting: Nutrition

## 2023-11-26 ENCOUNTER — Encounter

## 2023-11-27 ENCOUNTER — Telehealth: Payer: Self-pay

## 2023-11-27 ENCOUNTER — Encounter

## 2023-11-27 NOTE — Unmapped (Signed)
 [facility] [name] has been contacted in regards to their refill of PROGRAF  1 mg capsule (tacrolimus ). At this time, they have declined refill due to patient having more than 2 weeks on hand . Refill assessment call date has been updated per the patient's request.

## 2023-11-27 NOTE — Telephone Encounter (Signed)
 Patients mother answered the phone and cancelled the pre visit and the colonoscopy.  Mother stated that the patient would call back and reschedule both the PV and colonoscopy.

## 2023-11-28 DIAGNOSIS — Q851 Tuberous sclerosis: Secondary | ICD-10-CM | POA: Diagnosis not present

## 2023-12-11 ENCOUNTER — Inpatient Hospital Stay (HOSPITAL_COMMUNITY)
Admission: EM | Admit: 2023-12-11 | Discharge: 2023-12-15 | DRG: 872 | Disposition: A | Discharge status: Home / Self Care | Attending: Family Medicine | Admitting: Family Medicine

## 2023-12-11 ENCOUNTER — Other Ambulatory Visit: Payer: Self-pay

## 2023-12-11 DIAGNOSIS — L02212 Cutaneous abscess of back [any part, except buttock]: Secondary | ICD-10-CM | POA: Diagnosis not present

## 2023-12-11 DIAGNOSIS — Z8249 Family history of ischemic heart disease and other diseases of the circulatory system: Secondary | ICD-10-CM | POA: Diagnosis not present

## 2023-12-11 DIAGNOSIS — E46 Unspecified protein-calorie malnutrition: Secondary | ICD-10-CM | POA: Diagnosis present

## 2023-12-11 DIAGNOSIS — Z888 Allergy status to other drugs, medicaments and biological substances status: Secondary | ICD-10-CM

## 2023-12-11 DIAGNOSIS — A419 Sepsis, unspecified organism: Principal | ICD-10-CM | POA: Diagnosis present

## 2023-12-11 DIAGNOSIS — I10 Essential (primary) hypertension: Secondary | ICD-10-CM | POA: Diagnosis not present

## 2023-12-11 DIAGNOSIS — L0390 Cellulitis, unspecified: Principal | ICD-10-CM

## 2023-12-11 DIAGNOSIS — Q612 Polycystic kidney, adult type: Secondary | ICD-10-CM

## 2023-12-11 DIAGNOSIS — Z681 Body mass index (BMI) 19 or less, adult: Secondary | ICD-10-CM | POA: Diagnosis not present

## 2023-12-11 DIAGNOSIS — L03317 Cellulitis of buttock: Secondary | ICD-10-CM | POA: Diagnosis present

## 2023-12-11 DIAGNOSIS — Z79899 Other long term (current) drug therapy: Secondary | ICD-10-CM

## 2023-12-11 DIAGNOSIS — R112 Nausea with vomiting, unspecified: Secondary | ICD-10-CM | POA: Diagnosis present

## 2023-12-11 DIAGNOSIS — Z905 Acquired absence of kidney: Secondary | ICD-10-CM | POA: Diagnosis not present

## 2023-12-11 DIAGNOSIS — Z79621 Long term (current) use of calcineurin inhibitor: Secondary | ICD-10-CM | POA: Diagnosis not present

## 2023-12-11 DIAGNOSIS — L0231 Cutaneous abscess of buttock: Secondary | ICD-10-CM | POA: Diagnosis present

## 2023-12-11 DIAGNOSIS — Z882 Allergy status to sulfonamides status: Secondary | ICD-10-CM

## 2023-12-11 DIAGNOSIS — L02413 Cutaneous abscess of right upper limb: Secondary | ICD-10-CM | POA: Diagnosis not present

## 2023-12-11 DIAGNOSIS — Z825 Family history of asthma and other chronic lower respiratory diseases: Secondary | ICD-10-CM

## 2023-12-11 DIAGNOSIS — L0291 Cutaneous abscess, unspecified: Secondary | ICD-10-CM

## 2023-12-11 DIAGNOSIS — Z940 Kidney transplant status: Secondary | ICD-10-CM | POA: Diagnosis not present

## 2023-12-11 DIAGNOSIS — G2581 Restless legs syndrome: Secondary | ICD-10-CM | POA: Diagnosis present

## 2023-12-11 DIAGNOSIS — D84821 Immunodeficiency due to drugs: Secondary | ICD-10-CM | POA: Diagnosis present

## 2023-12-11 DIAGNOSIS — Q851 Tuberous sclerosis: Secondary | ICD-10-CM

## 2023-12-11 DIAGNOSIS — R000 Tachycardia, unspecified: Secondary | ICD-10-CM | POA: Diagnosis present

## 2023-12-11 LAB — CBC WITH DIFFERENTIAL/PLATELET
Abs Immature Granulocytes: 0.11 K/uL — ABNORMAL HIGH (ref 0.00–0.07)
Basophils Absolute: 0.1 K/uL (ref 0.0–0.1)
Basophils Relative: 0 %
Eosinophils Absolute: 0.3 K/uL (ref 0.0–0.5)
Eosinophils Relative: 2 %
HCT: 34.6 % — ABNORMAL LOW (ref 39.0–52.0)
Hemoglobin: 10.8 g/dL — ABNORMAL LOW (ref 13.0–17.0)
Immature Granulocytes: 1 %
Lymphocytes Relative: 6 %
Lymphs Abs: 1 K/uL (ref 0.7–4.0)
MCH: 29.5 pg (ref 26.0–34.0)
MCHC: 31.2 g/dL (ref 30.0–36.0)
MCV: 94.5 fL (ref 80.0–100.0)
Monocytes Absolute: 1.1 K/uL — ABNORMAL HIGH (ref 0.1–1.0)
Monocytes Relative: 6 %
Neutro Abs: 15.1 K/uL — ABNORMAL HIGH (ref 1.7–7.7)
Neutrophils Relative %: 85 %
Platelets: 229 K/uL (ref 150–400)
RBC: 3.66 MIL/uL — ABNORMAL LOW (ref 4.22–5.81)
RDW: 12.8 % (ref 11.5–15.5)
WBC: 17.6 K/uL — ABNORMAL HIGH (ref 4.0–10.5)
nRBC: 0 % (ref 0.0–0.2)

## 2023-12-11 LAB — BASIC METABOLIC PANEL WITH GFR
Anion gap: 8 (ref 5–15)
BUN: 31 mg/dL — ABNORMAL HIGH (ref 6–20)
CO2: 17 mmol/L — ABNORMAL LOW (ref 22–32)
Calcium: 9 mg/dL (ref 8.9–10.3)
Chloride: 108 mmol/L (ref 98–111)
Creatinine, Ser: 2.41 mg/dL — ABNORMAL HIGH (ref 0.61–1.24)
GFR, Estimated: 32 mL/min — ABNORMAL LOW (ref >60)
Glucose, Bld: 118 mg/dL — ABNORMAL HIGH (ref 70–99)
Potassium: 5 mmol/L (ref 3.5–5.1)
Sodium: 133 mmol/L — ABNORMAL LOW (ref 135–145)

## 2023-12-11 LAB — I-STAT CG4 LACTIC ACID, ED: Lactic Acid, Venous: 1.8 mmol/L (ref 0.5–1.9)

## 2023-12-11 MED ORDER — ACETAMINOPHEN 325 MG PO TABS
650.0000 mg | ORAL_TABLET | Freq: Once | ORAL | Status: AC
  Administered 2023-12-11: 650 mg via ORAL
  Filled 2023-12-11: qty 2

## 2023-12-11 MED ORDER — ONDANSETRON 4 MG PO TBDP
4.0000 mg | ORAL_TABLET | Freq: Once | ORAL | Status: AC
  Administered 2023-12-11: 4 mg via ORAL
  Filled 2023-12-11: qty 1

## 2023-12-11 NOTE — ED Provider Triage Note (Signed)
 Emergency Medicine Provider Triage Evaluation Note  Tyler Alvarez , a 48 y.o. male  was evaluated in triage.  Pt complains of multiple boils on back as well as buttock.  Patient states he has a history of tuberous sclerosis which is a genetic condition where cyst go all over the body including internally.  Patient states that this was the reason for his renal transplant.  Patient states that everything is overall well since his kidney transplant other than development of these boils. Appreciates fever, chills, as well as back pain over areas of boils.  Patient states that this has happened multiple times with him being admitted to the hospital for IV antibiotics as well as a general surgery consult the majority of times for multiple incision and drainages.  Review of Systems  Positive: Fever, chills, multiple boils on back and buttocks, nausea, vomiting Negative: Chest pain, shortness of breath, abdominal pain  Physical Exam  BP 127/83   Pulse (!) 112   Temp (!) 100.8 F (38.2 C) (Oral)   Resp 20   Ht 6' (1.829 m)   Wt 51.6 kg   SpO2 98%   BMI 15.43 kg/m  Gen:   Awake, no distress   Resp:  Normal effort  MSK:   Moves extremities without difficulty  Other:  Multiple boils present on back and buttock, some more inflamed than others, 1 very large 1 at top of back which is erythematous, bulging, and fluctuant, definitely needs incision and drainage, patient febrile and tachycardic, otherwise alert and oriented, engaging conversation is appropriate  Medical Decision Making  Medically screening exam initiated at 10:07 PM.  Appropriate orders placed.  Tyler Alvarez was informed that the remainder of the evaluation will be completed by another provider, this initial triage assessment does not replace that evaluation, and the importance of remaining in the ED until their evaluation is complete.  Orders: CBC, BMP, i-STAT lactic acid, Zofran  and Tylenol  ordered for fever as well as nausea    Tyler Terrall FALCON, PA-C 12/11/23 2218

## 2023-12-11 NOTE — ED Triage Notes (Signed)
 Pt coming in with reports of boils of back and on buttocks. Feeling ill past few days. Reports significantly worsening N/V today along with fever 100.5. Hx of kidney transplant.

## 2023-12-12 DIAGNOSIS — L02212 Cutaneous abscess of back [any part, except buttock]: Secondary | ICD-10-CM | POA: Diagnosis present

## 2023-12-12 DIAGNOSIS — L0231 Cutaneous abscess of buttock: Secondary | ICD-10-CM | POA: Diagnosis present

## 2023-12-12 DIAGNOSIS — D84821 Immunodeficiency due to drugs: Secondary | ICD-10-CM | POA: Diagnosis present

## 2023-12-12 DIAGNOSIS — I10 Essential (primary) hypertension: Secondary | ICD-10-CM | POA: Diagnosis present

## 2023-12-12 DIAGNOSIS — Z79621 Long term (current) use of calcineurin inhibitor: Secondary | ICD-10-CM | POA: Diagnosis not present

## 2023-12-12 DIAGNOSIS — A419 Sepsis, unspecified organism: Secondary | ICD-10-CM | POA: Diagnosis present

## 2023-12-12 DIAGNOSIS — E46 Unspecified protein-calorie malnutrition: Secondary | ICD-10-CM | POA: Diagnosis present

## 2023-12-12 DIAGNOSIS — L02413 Cutaneous abscess of right upper limb: Secondary | ICD-10-CM | POA: Diagnosis present

## 2023-12-12 DIAGNOSIS — Q612 Polycystic kidney, adult type: Secondary | ICD-10-CM | POA: Diagnosis not present

## 2023-12-12 DIAGNOSIS — L03317 Cellulitis of buttock: Secondary | ICD-10-CM | POA: Diagnosis present

## 2023-12-12 DIAGNOSIS — Z882 Allergy status to sulfonamides status: Secondary | ICD-10-CM | POA: Diagnosis not present

## 2023-12-12 DIAGNOSIS — Z825 Family history of asthma and other chronic lower respiratory diseases: Secondary | ICD-10-CM | POA: Diagnosis not present

## 2023-12-12 DIAGNOSIS — Z905 Acquired absence of kidney: Secondary | ICD-10-CM | POA: Diagnosis not present

## 2023-12-12 DIAGNOSIS — R000 Tachycardia, unspecified: Secondary | ICD-10-CM | POA: Diagnosis present

## 2023-12-12 DIAGNOSIS — Z888 Allergy status to other drugs, medicaments and biological substances status: Secondary | ICD-10-CM | POA: Diagnosis not present

## 2023-12-12 DIAGNOSIS — Q851 Tuberous sclerosis: Secondary | ICD-10-CM | POA: Diagnosis not present

## 2023-12-12 DIAGNOSIS — G2581 Restless legs syndrome: Secondary | ICD-10-CM | POA: Diagnosis present

## 2023-12-12 DIAGNOSIS — Z79899 Other long term (current) drug therapy: Secondary | ICD-10-CM | POA: Diagnosis not present

## 2023-12-12 DIAGNOSIS — Z8249 Family history of ischemic heart disease and other diseases of the circulatory system: Secondary | ICD-10-CM | POA: Diagnosis not present

## 2023-12-12 DIAGNOSIS — R112 Nausea with vomiting, unspecified: Secondary | ICD-10-CM | POA: Diagnosis present

## 2023-12-12 DIAGNOSIS — Z940 Kidney transplant status: Secondary | ICD-10-CM | POA: Diagnosis not present

## 2023-12-12 DIAGNOSIS — Z681 Body mass index (BMI) 19 or less, adult: Secondary | ICD-10-CM | POA: Diagnosis not present

## 2023-12-12 HISTORY — DX: Cellulitis of buttock: L03.317

## 2023-12-12 LAB — CBC
HCT: 30 % — ABNORMAL LOW (ref 39.0–52.0)
Hemoglobin: 9.9 g/dL — ABNORMAL LOW (ref 13.0–17.0)
MCH: 29 pg (ref 26.0–34.0)
MCHC: 33 g/dL (ref 30.0–36.0)
MCV: 88 fL (ref 80.0–100.0)
Platelets: 257 K/uL (ref 150–400)
RBC: 3.41 MIL/uL — ABNORMAL LOW (ref 4.22–5.81)
RDW: 13 % (ref 11.5–15.5)
WBC: 17.6 K/uL — ABNORMAL HIGH (ref 4.0–10.5)
nRBC: 0 % (ref 0.0–0.2)

## 2023-12-12 LAB — CREATININE, SERUM
Creatinine, Ser: 3.32 mg/dL — ABNORMAL HIGH (ref 0.61–1.24)
GFR, Estimated: 22 mL/min — ABNORMAL LOW (ref >60)

## 2023-12-12 LAB — I-STAT CG4 LACTIC ACID, ED: Lactic Acid, Venous: 0.5 mmol/L (ref 0.5–1.9)

## 2023-12-12 MED ORDER — SODIUM CHLORIDE 0.9 % IV BOLUS
1000.0000 mL | Freq: Once | INTRAVENOUS | Status: AC
  Administered 2023-12-12: 1000 mL via INTRAVENOUS

## 2023-12-12 MED ORDER — ACETAMINOPHEN 325 MG PO TABS
650.0000 mg | ORAL_TABLET | Freq: Four times a day (QID) | ORAL | Status: DC | PRN
  Administered 2023-12-13: 650 mg via ORAL
  Filled 2023-12-12: qty 2

## 2023-12-12 MED ORDER — ENSURE PLUS HIGH PROTEIN PO LIQD
237.0000 mL | Freq: Two times a day (BID) | ORAL | Status: DC
  Administered 2023-12-12: 237 mL via ORAL

## 2023-12-12 MED ORDER — LOSARTAN POTASSIUM 25 MG PO TABS
12.5000 mg | ORAL_TABLET | Freq: Every day | ORAL | Status: DC
  Filled 2023-12-12 (×2): qty 0.5

## 2023-12-12 MED ORDER — HEPARIN SODIUM (PORCINE) 5000 UNIT/ML IJ SOLN
5000.0000 [IU] | Freq: Three times a day (TID) | INTRAMUSCULAR | Status: DC
  Administered 2023-12-12 – 2023-12-15 (×8): 5000 [IU] via SUBCUTANEOUS
  Filled 2023-12-12 (×8): qty 1

## 2023-12-12 MED ORDER — ACETAMINOPHEN 325 MG PO TABS
650.0000 mg | ORAL_TABLET | Freq: Once | ORAL | Status: AC
  Administered 2023-12-12: 650 mg via ORAL
  Filled 2023-12-12: qty 2

## 2023-12-12 MED ORDER — LIDOCAINE-EPINEPHRINE (PF) 2 %-1:200000 IJ SOLN
20.0000 mL | Freq: Once | INTRAMUSCULAR | Status: AC
  Administered 2023-12-12: 20 mL
  Filled 2023-12-12: qty 20

## 2023-12-12 MED ORDER — PREDNISONE 10 MG PO TABS
5.0000 mg | ORAL_TABLET | Freq: Every day | ORAL | Status: DC
  Administered 2023-12-12 – 2023-12-15 (×4): 5 mg via ORAL
  Filled 2023-12-12 (×4): qty 1

## 2023-12-12 MED ORDER — MIRTAZAPINE 15 MG PO TABS
7.5000 mg | ORAL_TABLET | Freq: Every day | ORAL | Status: DC
Start: 2023-12-12 — End: 2023-12-15
  Filled 2023-12-12 (×3): qty 1

## 2023-12-12 MED ORDER — ONDANSETRON HCL 4 MG/2ML IJ SOLN
4.0000 mg | Freq: Once | INTRAMUSCULAR | Status: AC
  Administered 2023-12-12: 4 mg via INTRAVENOUS
  Filled 2023-12-12: qty 2

## 2023-12-12 MED ORDER — TACROLIMUS 1 MG PO CAPS
2.0000 mg | ORAL_CAPSULE | Freq: Two times a day (BID) | ORAL | Status: DC
Start: 2023-12-12 — End: 2023-12-15
  Administered 2023-12-12 – 2023-12-15 (×6): 2 mg via ORAL
  Filled 2023-12-12 (×6): qty 2

## 2023-12-12 MED ORDER — AMLODIPINE BESYLATE 5 MG PO TABS
5.0000 mg | ORAL_TABLET | Freq: Every day | ORAL | Status: DC
  Administered 2023-12-13 – 2023-12-15 (×3): 5 mg via ORAL
  Filled 2023-12-12 (×4): qty 1

## 2023-12-12 MED ORDER — GABAPENTIN 300 MG PO CAPS
300.0000 mg | ORAL_CAPSULE | Freq: Every day | ORAL | Status: DC
  Administered 2023-12-12 – 2023-12-14 (×3): 300 mg via ORAL
  Filled 2023-12-12 (×3): qty 1

## 2023-12-12 MED ORDER — SODIUM CHLORIDE 0.9 % IV SOLN
2.0000 g | Freq: Once | INTRAVENOUS | Status: AC
  Administered 2023-12-12: 2 g via INTRAVENOUS
  Filled 2023-12-12: qty 20

## 2023-12-12 NOTE — ED Notes (Signed)
 Unable to obtain second blood culture specimen, PA notified

## 2023-12-12 NOTE — ED Notes (Signed)
ED Provider at bedside for I&D.   

## 2023-12-12 NOTE — H&P (Signed)
 History and Physical    Patient: Tyler Alvarez FMW:[id number] DOB: 1976-04-27 DOA: 12/11/2023 DOS: the patient was seen and examined on 12/12/2023 PCP: Knute Thersia Bitters, FNP  Patient coming from: Home  Chief Complaint:  Chief Complaint  Patient presents with   Fever   Recurrent Skin Infections   HPI: Tyler Alvarez is a 48 y.o. male with medical history significant of HTN, AAA without rupture, renal failure s/p transplant 2001 on chronic immunosuppressants, tuberous sclerosis, and seizure disorder p/w R buttock cellulitis.  Pt states that he has been in pain with worsening R buttock wound for the past few weeks. He has not taken any medications for these wounds, and has only treated them with warm compresses in order to bring them to a head. However, the wound on his R buttock failed to improved despite warm compress so he presented to the ED yesterday afternoon after checking his temperature, which was reportedly 100.5 and having uncontrolled shivering. Of note, pt reports sitting upright most of the day and gaming, and his mother, who is bedside, reports that he has no appetite, and eats very little.   In the ED, pt febrile 100.5 x1. Labs notable for Cr 2.41-->3.32, WBC 17.6, lactic acid 1.8-->0.5. Blood cultures pending. EDP requested medical admission given ongoing concern for infection and elevated WBC.  Review of Systems: As mentioned in the history of present illness. All other systems reviewed and are negative. Past Medical History:  Diagnosis Date   Abscess 03/22/2021   ADD (attention deficit disorder)    Anxiety    Ascending aortic aneurysm (HCC)    Benign skin lesion of neck    left neck cyst   Cellulitis 02/24/2021   Cellulitis and abscess of buttock    Chronic kidney disease    s/p transplant 2001   Community acquired pneumonia 01/03/2020   Family history of adverse reaction to anesthesia    Pt mother stated Father-I think that he stopped breathing, they  called a code but he came through okay   GERD (gastroesophageal reflux disease)    Headache    Migraines   Heart murmur    Hypertension    Immune deficiency disorder (HCC)    Restless leg syndrome    Seizures (HCC)    treated by Dr. Lane, no seizures in years and years   Sepsis (HCC) 02/25/2021   Sepsis without acute organ dysfunction (HCC)    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HCC)    Wears glasses    Past Surgical History:  Procedure Laterality Date   AV FISTULA PLACEMENT     KIDNEY TRANSPLANT  2001   LESION EXCISION WITH COMPLEX REPAIR Left 05/24/2021   Procedure: EXCISION BENIGN LESION NECK 9CM, LAYERED CLOSURE NECK 10CM;  Surgeon: Arelia Filippo, MD;  Location: [facility] OR;  Service: Plastics;  Laterality: Left;   MASS EXCISION Left 12/11/2016   Procedure: EXCISION OF BENIGN LESION OF THE NECK WITH LAYERED CLOSURE;  Surgeon: Arelia Filippo, MD;  Location: [facility] OR;  Service: Plastics;  Laterality: Left;   MASS EXCISION     neck x2   MASS EXCISION Bilateral 04/01/2018   Procedure: excision benign lesions left neck and right and left cheek ( 4cm, 2cm, 1cm x3), layered closure neck <10cm, layered closure right cheek 1cm;  Surgeon: Arelia Filippo, MD;  Location: [facility] OR;  Service: Plastics;  Laterality: Bilateral;  left cheek lesion   MULTIPLE TOOTH EXTRACTIONS     NEPHRECTOMY     both removed in 1999, 3 months  apart   REVISON OF ARTERIOVENOUS FISTULA Left 07/16/2014   Procedure: EXCISION ANEURYSMAL AREA OF LEFT ARTERIOVENOUS FISTULA;  Surgeon: Gaile LELON New, MD;  Location: [facility] OR;  Service: Vascular;  Laterality: Left;   Social History:  reports that he has never smoked. He has never used smokeless tobacco. He reports that he does not drink alcohol and does not use drugs.  Allergies  Allergen Reactions   Erythromycin Nausea And Vomiting   Sulfa  Antibiotics Itching and Nausea And Vomiting    Family History  Problem Relation Age of Onset   Hypothyroidism Mother    Cancer Mother 52        cervical   Hypertension Mother    COPD Mother    COPD Father 39       COPD   Ulcers Father    COPD Brother    Heart disease Brother    Cancer Other     Prior to Admission medications   Medication Sig Start Date End Date Taking? Authorizing Provider  acetaminophen  (TYLENOL ) 500 MG tablet Take 1,000 mg by mouth daily as needed for mild pain, headache or fever.    [provider]  amLODipine  (NORVASC ) 5 MG tablet Take 5 mg by mouth daily. 05/19/16   [provider]  brompheniramine -pseudoephedrine (DIMETAPP) 1-15 MG/5ML ELIX Take 20 mLs by mouth daily as needed for allergies (Cold).    [provider]  gabapentin  (NEURONTIN ) 300 MG capsule Take 1 capsule (300 mg total) by mouth at bedtime. 05/30/23   Caudle, Thersia Bitters, FNP  losartan  (COZAAR ) 25 MG tablet Take 12.5 mg by mouth daily.    [provider]  mirtazapine  (REMERON ) 7.5 MG tablet TAKE 1 TABLET BY MOUTH AT BEDTIME. 11/09/23   de [place], Quintin PARAS, MD  ondansetron  (ZOFRAN -ODT) 4 MG disintegrating tablet Take 1 tablet (4 mg total) by mouth every 8 (eight) hours as needed for nausea or vomiting. 07/29/23   Keith, Kayla N, PA-C  predniSONE  (DELTASONE ) 5 MG tablet Take 5 mg by mouth daily.     [provider]  tacrolimus  (PROGRAF ) 1 MG capsule Take 2 mg by mouth 2 (two) times daily.     [provider]    Physical Exam: Vitals:   12/12/23 0815 12/12/23 0830 12/12/23 0845 12/12/23 0857  BP: 122/71 119/73 129/72 117/76  Pulse: 88 83 92 89  Resp:    18  Temp:    99 F (37.2 C)  TempSrc:    Oral  SpO2: 100% 100% 100% 100%  Weight:      Height:       General: Alert, oriented x3, resting comfortably in no acute distress Respiratory: Lungs clear to auscultation bilaterally with normal respiratory effort; no w/r/r Cardiovascular: Regular rate and rhythm w/o m/r/g   Data Reviewed:  Lab Results  Component Value Date   WBC 17.6 (H) 12/12/2023   HGB 9.9 (L) 12/12/2023   HCT 30.0  (L) 12/12/2023   MCV 88.0 12/12/2023   PLT 257 12/12/2023   Lab Results  Component Value Date   GLUCOSE 118 (H) 12/11/2023   CALCIUM 9.0 12/11/2023   NA 133 (L) 12/11/2023   K 5.0 12/11/2023   CO2 17 (L) 12/11/2023   CL 108 12/11/2023   BUN 31 (H) 12/11/2023   CREATININE 3.32 (H) 12/12/2023   Lab Results  Component Value Date   ALT 22 07/30/2023   AST 28 07/30/2023   ALKPHOS 85 07/30/2023   BILITOT 0.7 07/30/2023   Lab Results  Component Value Date   INR 1.0 02/24/2021   INR 1.1 01/03/2020   INR 1.1 09/06/2019    Radiology: No results found.   Assessment and Plan: 67M h/o HTN, AAA without rupture, renal failure s/p transplant 2001 on chronic immunosuppressants, tuberous sclerosis, and seizure disorder p/w R buttock cellulitis.  R buttock cellulitis Immunocompromised with elevated WBC (presumably 2/2 daily prednisone  use), and admitted for ongoing infection w/ reported rigors. Febrile on admission. High suspicion for wound breakdown from daily gaming c/b malnutrition and ongoing immunosuppression -Wound care consulted apprec eval/recs -IV CTX for now -F/u blood cultures to exclude bacteremia  S/p renal transplant -PTA tacrolimus  2mg  BID and prednisone  5mg  daily -F/u prograf  level  Leukocytosis Presumably related to demargination of WBCs from daily steroid use; low suspicion for sepsis/bacteremia, but will need to exclude in immunocompromised pt -F/u CBC daily  Malnutrition -RD consulted; apprec eval/recs -F/u calorie count   Advance Care Planning:   Code Status: Full Code   Consults: N/A  Family Communication: N/A  Severity of Illness: The appropriate patient status for this patient is OBSERVATION. Observation status is judged to be reasonable and necessary in order to provide the required intensity of service to ensure the patient's safety. The patient's presenting symptoms, physical exam findings, and initial radiographic and laboratory data in the  context of their medical condition is felt to place them at decreased risk for further clinical deterioration. Furthermore, it is anticipated that the patient will be medically stable for discharge from the hospital within 2 midnights of admission.    ------- I spent 56 minutes reviewing previous labs/notes, obtaining separate history at the bedside, counseling/discussing the treatment plan outlined above, ordering medications/tests, and performing clinical documentation.   Author: Marsha Ada, MD 12/12/2023 2:54 PM  For on call review [email or web address].

## 2023-12-12 NOTE — ED Notes (Signed)
 Pt. Is in bed; family at bedside; pt. Complains of feeling pain where abscess' were drained.

## 2023-12-12 NOTE — Evaluation (Signed)
 Physical Therapy Evaluation Patient Details Name: Tyler Alvarez MRN: [id number] DOB: [birth year] Today's Date: 12/12/2023  History of Present Illness  48 y.o. male presents to [facility] hospital on 12/11/2023 with fevers, nausea, vomiting and boils to R side of back and buttocks. PMH includes tuberosclerosis, s/p renal transplant in 2001, gout, CKD III, GERD, anemia, seizures.  Clinical Impression  Pt presents to PT with deficits in gait, balance, endurance. Pt experiences one anterior loss of balance after his shoe caught the floor during initiation of swing phase, otherwise no losses of balance noted. Per report of the pt's mother the pt mobilizes infrequently at home. PT provides encouragement for frequent ambulation within the home in an effort to improve endurance and to maintain or build upon muscle mass. PT will follow during admission with mobility largely driven by the patient himself and mobility specialists. PT anticipates no post-acute PT needs.        If plan is discharge home, recommend the following: Assistance with cooking/housework;Assist for transportation   Can travel by private vehicle        Equipment Recommendations None recommended by PT  Recommendations for Other Services       Functional Status Assessment Patient has had a recent decline in their functional status and demonstrates the ability to make significant improvements in function in a reasonable and predictable amount of time.     Precautions / Restrictions Precautions Precautions: Fall Restrictions Weight Bearing Restrictions Per Provider Order: No      Mobility  Bed Mobility Overal bed mobility: Independent                  Transfers Overall transfer level: Independent                      Ambulation/Gait Ambulation/Gait assistance: Supervision Gait Distance (Feet): 100 Feet Assistive device: None Gait Pattern/deviations: Step-through pattern Gait velocity: functional Gait  velocity interpretation: 1.31 - 2.62 ft/sec, indicative of limited community ambulator   General Gait Details: pt with one instance of reduced foot clearance of R foot resulting in shoe catching the ground and causing an anterior loss of balance which the pt corrects with stepping strategy, otherwise no other balance deviations noted.  Stairs            Wheelchair Mobility     Tilt Bed    Modified Rankin (Stroke Patients Only)       Balance Overall balance assessment: Needs assistance Sitting-balance support: No upper extremity supported, Feet supported Sitting balance-Leahy Scale: Good     Standing balance support: No upper extremity supported, During functional activity Standing balance-Leahy Scale: Good                               Pertinent Vitals/Pain Pain Assessment Pain Assessment: 0-10 Pain Score: 7  Pain Location: back and buttock at sites of incision and drainage Pain Descriptors / Indicators: Sore Pain Intervention(s): Monitored during session    Home Living Family/patient expects to be discharged to:: Private residence Living Arrangements: Parent Available Help at Discharge: Family;Available 24 hours/day Type of Home: Mobile home Home Access: Stairs to enter Entrance Stairs-Rails: Can reach both Entrance Stairs-Number of Steps: 3   Home Layout: One level Home Equipment: Agricultural consultant (2 wheels);Rollator (4 wheels);Cane - single point      Prior Function Prior Level of Function : Independent/Modified Independent (does not drive)  Mobility Comments: pt reports PRN use of rollator on days when he feels less steady. limited mobility at home per pt's mother       Extremity/Trunk Assessment   Upper Extremity Assessment Upper Extremity Assessment: Overall WFL for tasks assessed    Lower Extremity Assessment Lower Extremity Assessment: Overall WFL for tasks assessed    Cervical / Trunk Assessment Cervical / Trunk  Assessment: Normal  Communication   Communication Communication: No apparent difficulties    Cognition Arousal: Alert Behavior During Therapy: WFL for tasks assessed/performed   PT - Cognitive impairments: No apparent impairments                         Following commands: Intact       Cueing Cueing Techniques: Verbal cues     General Comments General comments (skin integrity, edema, etc.): VSS on RA    Exercises     Assessment/Plan    PT Assessment Patient needs continued PT services  PT Problem List Decreased activity tolerance;Decreased balance;Decreased mobility       PT Treatment Interventions Gait training;Stair training;Balance training;Neuromuscular re-education;Therapeutic exercise;Patient/family education    PT Goals (Current goals can be found in the Care Plan section)  Acute Rehab PT Goals Patient Stated Goal: to return home PT Goal Formulation: With patient Time For Goal Achievement: 12/26/23 Potential to Achieve Goals: Good Additional Goals Additional Goal #1: Pt will score >19/24 on the DGI to indicate a reduced risk for falls Additional Goal #2: Pt will score >45/56 on the BERG to indicate a reduced risk for falls    Frequency Min 1X/week     Co-evaluation               AM-PAC PT 6 Clicks Mobility  Outcome Measure Help needed turning from your back to your side while in a flat bed without using bedrails?: None Help needed moving from lying on your back to sitting on the side of a flat bed without using bedrails?: None Help needed moving to and from a bed to a chair (including a wheelchair)?: None Help needed standing up from a chair using your arms (e.g., wheelchair or bedside chair)?: None Help needed to walk in hospital room?: A Little Help needed climbing 3-5 steps with a railing? : A Little 6 Click Score: 22    End of Session Equipment Utilized During Treatment: Gait belt Activity Tolerance: Patient tolerated treatment  well Patient left: in bed;with call bell/phone within reach;with family/visitor present Nurse Communication: Mobility status PT Visit Diagnosis: Other abnormalities of gait and mobility (R26.89)    Time: 8655-8641 PT Time Calculation (min) (ACUTE ONLY): 14 min   Charges:   PT Evaluation $PT Eval Low Complexity: 1 Low   PT General Charges $$ ACUTE PT VISIT: 1 Visit         Bernardino JINNY Ruth, PT, DPT Acute Rehabilitation Office [phone]   Bernardino JINNY Ruth 12/12/2023, 2:13 PM

## 2023-12-12 NOTE — ED Notes (Signed)
 IV start attempted x 2, without success. IV team consult initiated.

## 2023-12-12 NOTE — Consult Note (Signed)
 WOC Nurse Consult Note: Reason for Consult: buttock abscess Patient has been followed by Dr. Arelia outpatient for skin lesions;  PMH significant for tuberous sclerosis. Renal transplant at [facility].  Wound type: abscess related to underlying dx.  Pressure Injury POA: NA Measurement: see I&D procedure note; ED provider  Wound bed: see notes Drainage (amount, consistency, odor) see nursing flow sheets Periwound: erythema  Dressing procedure/placement/frequency: Daily; flush with saline (shoulder and buttock wounds); pat dry Repack with 1/2 iodoform packing strip Soila # [phone]), may need to use stick end of sterile applicator. Top with dry dressing. Change daily. Teach CG to perform      FU with PCP or plastics post DC for wounds.   Discussed POC with hospitalist and bedside nurse.  Re consult if needed, will not follow at this time. Thanks  Shirelle Tootle [employer], RN,CWOCN, CNS, [employer] [phone]

## 2023-12-12 NOTE — TOC CM/SW Note (Signed)
 [facility] - Inpatient Brief Assessment   Patient Details  Name: PATON CRUM MRN: [id number] Date of Birth: 1976/03/23  [facility]) CM/SW Contact:    Lauraine FORBES Saa, LCSW Phone Number: 12/12/2023, 12:05 PM   Clinical Narrative:  12:05 PM Per chart review, patient resides at home with parent(s). Patient has a PCP and insurance. Patient does not have SNF history. Patient has HH history with [facility] and [facility]. Patient has rolling walker through [facility]. Patient's preferred pharmacy's are [facility], [facility], [facility] [address] and [facility] [phone] [place]. No TOC needs were identified at this time. TOC will continue to follow and be available to assist.  Transition of Care Asessment: Insurance and Status: Insurance coverage has been reviewed Patient has primary care physician: Yes Home environment has been reviewed: Private Residence Prior level of function:: N/A Prior/Current Home Services: No current home services (Has HH/DME history) Social Drivers of Health Review: SDOH reviewed no interventions necessary Readmission risk has been reviewed: Yes Transition of care needs: no transition of care needs at this time

## 2023-12-12 NOTE — ED Notes (Signed)
 IV team at bedside

## 2023-12-12 NOTE — ED Provider Notes (Signed)
 [facility] Provider Note   CSN: [id number] Arrival date & time: 12/11/23  2039     Patient presents with: Fever and Recurrent Skin Infections   Tyler Alvarez is a 48 y.o. male with primary medical history of tuberosclerosis, status post renal transplant due to renal failure in 2001 on immunosuppression and steroids, normocytic anemia, gout, CKD stage III, GERD, anemia, history of seizures.  Patient presents to ED for evaluation of fevers, nausea, vomiting and boils to right side of back and buttocks.  States that his boils have been there for the last 2 weeks.  Reports that in the last 2 weeks he has had fevers associated with nausea and vomiting.  Reports that in the waiting room his boils were draining but states that they have since ceased draining.  Reports that he was seen recently by plastic surgeon.  Status post renal transplant managed by [facility] team who he sees annually.  He is here with his mother at the bedside who provides the majority of the history.  Patient on chronic Prograf  and prednisone .  Fever      Prior to Admission medications   Medication Sig Start Date End Date Taking? Authorizing Provider  acetaminophen  (TYLENOL ) 500 MG tablet Take 1,000 mg by mouth daily as needed for mild pain, headache or fever.    [provider]  amLODipine  (NORVASC ) 5 MG tablet Take 5 mg by mouth daily. 05/19/16   [provider]  brompheniramine -pseudoephedrine (DIMETAPP) 1-15 MG/5ML ELIX Take 20 mLs by mouth daily as needed for allergies (Cold).    [provider]  gabapentin  (NEURONTIN ) 300 MG capsule Take 1 capsule (300 mg total) by mouth at bedtime. 05/30/23   Caudle, Thersia Bitters, FNP  losartan  (COZAAR ) 25 MG tablet Take 12.5 mg by mouth daily.    [provider]  mirtazapine  (REMERON ) 7.5 MG tablet TAKE 1 TABLET BY MOUTH AT BEDTIME. 11/09/23   de [place], Quintin PARAS, MD  ondansetron   (ZOFRAN -ODT) 4 MG disintegrating tablet Take 1 tablet (4 mg total) by mouth every 8 (eight) hours as needed for nausea or vomiting. 07/29/23   Keith, Kayla N, PA-C  predniSONE  (DELTASONE ) 5 MG tablet Take 5 mg by mouth daily.     [provider]  tacrolimus  (PROGRAF ) 1 MG capsule Take 2 mg by mouth 2 (two) times daily.     [provider]    Allergies: Erythromycin and Sulfa  antibiotics    Review of Systems  Constitutional:  Positive for fever.  All other systems reviewed and are negative.   Updated Vital Signs BP 135/67   Pulse 79   Temp 98.3 F (36.8 C)   Resp 20   Ht 6' (1.829 m)   Wt 51.6 kg   SpO2 100%   BMI 15.43 kg/m   Physical Exam Vitals and nursing note reviewed.  Constitutional:      General: He is not in acute distress.    Appearance: He is well-developed.  HENT:     Head: Normocephalic and atraumatic.  Eyes:     Conjunctiva/sclera: Conjunctivae normal.  Cardiovascular:     Rate and Rhythm: Normal rate and regular rhythm.     Heart sounds: No murmur heard. Pulmonary:     Effort: Pulmonary effort is normal. No respiratory distress.     Breath sounds: Normal breath sounds.  Abdominal:     Palpations: Abdomen is soft.     Tenderness: There is  no abdominal tenderness.  Musculoskeletal:        General: No swelling.     Cervical back: Neck supple.  Skin:    General: Skin is warm and dry.     Capillary Refill: Capillary refill takes less than 2 seconds.     Comments: Area of erythema, purulence to right buttock measuring 5 cm x 5 cm.  Area of erythema, purulence to right shoulder measuring 2 cm x 2 cm  Neurological:     Mental Status: He is alert and oriented to person, place, and time. Mental status is at baseline.  Psychiatric:        Mood and Affect: Mood normal.        (all labs ordered are listed, but only abnormal results are displayed) Labs Reviewed  CBC WITH DIFFERENTIAL/PLATELET - Abnormal; Notable for the following  components:      Result Value   WBC 17.6 (*)    RBC 3.66 (*)    Hemoglobin 10.8 (*)    HCT 34.6 (*)    Neutro Abs 15.1 (*)    Monocytes Absolute 1.1 (*)    Abs Immature Granulocytes 0.11 (*)    All other components within normal limits  BASIC METABOLIC PANEL WITH GFR - Abnormal; Notable for the following components:   Sodium 133 (*)    CO2 17 (*)    Glucose, Bld 118 (*)    BUN 31 (*)    Creatinine, Ser 2.41 (*)    GFR, Estimated 32 (*)    All other components within normal limits  CULTURE, BLOOD (ROUTINE X 2)  CULTURE, BLOOD (ROUTINE X 2)  I-STAT CG4 LACTIC ACID, ED  I-STAT CG4 LACTIC ACID, ED    EKG: None  Radiology: No results found.   .Incision and Drainage  Date/Time: 12/12/2023 5:39 AM  Performed by: Tyler Lonni FALCON, PA-C Authorized by: Tyler Lonni FALCON, PA-C   Consent:    Consent obtained:  Verbal   Consent given by:  Patient   Risks, benefits, and alternatives were discussed: yes     Risks discussed:  Bleeding and damage to other organs Universal protocol:    Patient identity confirmed:  Verbally with patient Location:    Type:  Abscess   Size:  5x5   Location: Right buttock. Pre-procedure details:    Skin preparation:  Povidone-iodine Sedation:    Sedation type:  None Anesthesia:    Anesthesia method:  Local infiltration   Local anesthetic:  Lidocaine  2% WITH epi Procedure type:    Complexity:  Simple Procedure details:    Incision types:  Stab incision   Wound management:  Probed and deloculated and irrigated with saline   Drainage:  Purulent   Drainage amount:  Moderate   Packing materials:  1/2 in iodoform gauze Post-procedure details:    Procedure completion:  Tolerated well, no immediate complications .Incision and Drainage  Date/Time: 12/12/2023 5:39 AM  Performed by: Tyler Lonni FALCON, PA-C Authorized by: Tyler Lonni FALCON, PA-C   Consent:    Consent obtained:  Verbal   Consent given by:  Patient   Risks, benefits,  and alternatives were discussed: yes     Risks discussed:  Bleeding and damage to other organs Universal protocol:    Patient identity confirmed:  Verbally with patient and arm band Location:    Type:  Abscess   Size:  2x2   Location: Right posterior shoulder. Pre-procedure details:    Skin preparation:  Povidone-iodine Sedation:    Sedation  type:  None Anesthesia:    Anesthesia method:  Local infiltration   Local anesthetic:  Lidocaine  2% WITH epi Procedure details:    Incision types:  Stab incision   Wound management:  Probed and deloculated and irrigated with saline   Drainage:  Purulent   Drainage amount:  Copious   Packing materials:  1/2 in iodoform gauze Post-procedure details:    Procedure completion:  Tolerated well, no immediate complications    Medications Ordered in the ED  acetaminophen  (TYLENOL ) tablet 650 mg (650 mg Oral Given 12/11/23 2245)  ondansetron  (ZOFRAN -ODT) disintegrating tablet 4 mg (4 mg Oral Given 12/11/23 2245)  lidocaine -EPINEPHrine  (XYLOCAINE  W/EPI) 2 %-1:200000 (PF) injection 20 mL (20 mLs Other Given by Other 12/12/23 0527)  cefTRIAXone  (ROCEPHIN ) 2 g in sodium chloride  0.9 % 100 mL IVPB (2 g Intravenous New Bag/Given 12/12/23 0527)  sodium chloride  0.9 % bolus 1,000 mL (1,000 mLs Intravenous New Bag/Given 12/12/23 0526)    Clinical Course as of 12/12/23 0645  Wed Dec 12, 2023  0432 Admit for cellulitis in the setting of immunosuppression, leukocytosis, tachycardia and fever [CG]    Clinical Course User Index [CG] Tyler Lonni FALCON, PA-C   Medical Decision Making Risk Prescription drug management. Decision regarding hospitalization.   This is a 47 year old male with history of tuberosclerosis presenting with fevers, tachycardia, worsening purulent cellulitis to right posterior shoulder and right buttock.  Reports symptoms ongoing for the last 2 weeks.  Was seen by plastic surgeon however no incision and drainage was performed.  On initial  exam patient arrives febrile, tachycardic.  Lung sounds clear bilaterally, no hypoxia.  Abdomen soft and compressible.  Area of erythema measuring 5 x 5 cm to right buttock consistent with abscess.  Area of erythema measuring 2 x 2 cm to posterior right shoulder with active drainage.  These areas were incised and drained as per procedure note.  Patient lab work in triage shows a leukocytosis of 17.6 and a baseline hemoglobin.  His metabolic panel reveals sodium 866, baseline creatinine 2.4 and GFR 32.  Anion gap 8.  Lactic acid 1.8.  At this time feel patient requires admission to the hospital due to being immunosuppressed in the setting of fever, tachycardia, elevated leukocytosis.  Have consulted for admission at this time.  Patient started on 2 g Rocephin .  At the end of my shift, patient workup not complete. Signed out to oncoming provider Soto PA-C. Plan of management discussed.   Final diagnoses:  Cellulitis, unspecified cellulitis site  Purulent abscess    ED Discharge Orders     None          Tyler Alvarez 12/12/23 0645    Tyler Charmaine FALCON, MD 12/13/23 [phone]

## 2023-12-13 DIAGNOSIS — L0231 Cutaneous abscess of buttock: Secondary | ICD-10-CM | POA: Diagnosis not present

## 2023-12-13 LAB — CBC WITH DIFFERENTIAL/PLATELET
Abs Immature Granulocytes: 0.07 K/uL (ref 0.00–0.07)
Basophils Absolute: 0.1 K/uL (ref 0.0–0.1)
Basophils Relative: 1 %
Eosinophils Absolute: 0.4 K/uL (ref 0.0–0.5)
Eosinophils Relative: 3 %
HCT: 27.6 % — ABNORMAL LOW (ref 39.0–52.0)
Hemoglobin: 9.3 g/dL — ABNORMAL LOW (ref 13.0–17.0)
Immature Granulocytes: 1 %
Lymphocytes Relative: 14 %
Lymphs Abs: 1.8 K/uL (ref 0.7–4.0)
MCH: 29.5 pg (ref 26.0–34.0)
MCHC: 33.7 g/dL (ref 30.0–36.0)
MCV: 87.6 fL (ref 80.0–100.0)
Monocytes Absolute: 0.7 K/uL (ref 0.1–1.0)
Monocytes Relative: 6 %
Neutro Abs: 9.5 K/uL — ABNORMAL HIGH (ref 1.7–7.7)
Neutrophils Relative %: 75 %
Platelets: 246 K/uL (ref 150–400)
RBC: 3.15 MIL/uL — ABNORMAL LOW (ref 4.22–5.81)
RDW: 12.9 % (ref 11.5–15.5)
WBC: 12.4 K/uL — ABNORMAL HIGH (ref 4.0–10.5)
nRBC: 0 % (ref 0.0–0.2)

## 2023-12-13 LAB — COMPREHENSIVE METABOLIC PANEL WITH GFR
ALT: 15 U/L (ref 0–44)
AST: 13 U/L — ABNORMAL LOW (ref 15–41)
Albumin: 2.6 g/dL — ABNORMAL LOW (ref 3.5–5.0)
Alkaline Phosphatase: 130 U/L — ABNORMAL HIGH (ref 38–126)
Anion gap: 5 (ref 5–15)
BUN: 44 mg/dL — ABNORMAL HIGH (ref 6–20)
CO2: 19 mmol/L — ABNORMAL LOW (ref 22–32)
Calcium: 8.8 mg/dL — ABNORMAL LOW (ref 8.9–10.3)
Chloride: 112 mmol/L — ABNORMAL HIGH (ref 98–111)
Creatinine, Ser: 2.93 mg/dL — ABNORMAL HIGH (ref 0.61–1.24)
GFR, Estimated: 25 mL/min — ABNORMAL LOW (ref >60)
Glucose, Bld: 114 mg/dL — ABNORMAL HIGH (ref 70–99)
Potassium: 4.9 mmol/L (ref 3.5–5.1)
Sodium: 136 mmol/L (ref 135–145)
Total Bilirubin: 0.4 mg/dL (ref 0.0–1.2)
Total Protein: 6 g/dL — ABNORMAL LOW (ref 6.5–8.1)

## 2023-12-13 LAB — PHOSPHORUS: Phosphorus: 2.8 mg/dL (ref 2.5–4.6)

## 2023-12-13 MED ORDER — VANCOMYCIN HCL IN DEXTROSE 1-5 GM/200ML-% IV SOLN: 1000.0000 mg | INTRAVENOUS | Status: DC

## 2023-12-13 MED ORDER — HYALURONIDASE HUMAN 150 UNIT/ML IJ SOLN
150.0000 [IU] | INTRAMUSCULAR | Status: AC
  Administered 2023-12-13: 150 [IU] via SUBCUTANEOUS
  Filled 2023-12-13: qty 1

## 2023-12-13 MED ORDER — SODIUM CHLORIDE 0.9 % IV SOLN
2.0000 g | Freq: Once | INTRAVENOUS | Status: AC
  Administered 2023-12-13: 2 g via INTRAVENOUS
  Filled 2023-12-13: qty 12.5

## 2023-12-13 MED ORDER — VANCOMYCIN HCL 1000 MG IV SOLR
1000.0000 mg | Freq: Once | INTRAVENOUS | Status: AC
  Administered 2023-12-13: 1000 mg via INTRAVENOUS
  Filled 2023-12-13: qty 20

## 2023-12-13 MED ORDER — SODIUM CHLORIDE 0.9 % IV SOLN: 2.0000 g | INTRAVENOUS | Status: DC

## 2023-12-13 MED ORDER — ADULT MULTIVITAMIN W/MINERALS CH
1.0000 | ORAL_TABLET | Freq: Every day | ORAL | Status: DC
  Administered 2023-12-13 – 2023-12-14 (×2): 1 via ORAL
  Filled 2023-12-13 (×3): qty 1

## 2023-12-13 NOTE — Progress Notes (Addendum)
 Pharmacy Antibiotic Note  Tyler Alvarez is a 48 y.o. male admitted on 12/11/2023 with cellulitis.  Pharmacy has been consulted for vancomycin , cefepime  dosing.  Plan: Vancomycin  1g IV x1 per MD then 1g q48h (eAUC 450, Scr 2.93) Goal trough 10-8mcg/mL, Goal AUC 400-600 -F/u renal function, LOT, and culture data -F/u vancomycin  levels PRN per protocol Cefepime  2g IV q24h  Height: 6' (182.9 cm) Weight: 51.6 kg (113 lb 12.1 oz) IBW/kg (Calculated) : 77.6  Temp (24hrs), Avg:98.2 F (36.8 C), Min:98 F (36.7 C), Max:98.7 F (37.1 C)  Recent Labs  Lab 12/11/23 2220 12/11/23 2225 12/12/23 0837 12/12/23 1101 12/13/23 0840  WBC 17.6*  --   --  17.6* 12.4*  CREATININE 2.41*  --   --  3.32* 2.93*  LATICACIDVEN  --  1.8 0.5  --   --     Estimated Creatinine Clearance: 22.3 mL/min (A) (by C-G formula based on SCr of 2.93 mg/dL (H)).    Allergies  Allergen Reactions   Erythromycin Nausea And Vomiting   Sulfa  Antibiotics Itching and Nausea And Vomiting    Antimicrobials this admission: Ceftriaxone  7/30 x1 Vanc 7/31> Cefepime  7/31>  Dose adjustments this admission:   Microbiology results: 7/30 BCX:  Thank you for allowing pharmacy to be a part of this patient's care.  Sharyne Glatter, PharmD, BCCCP Critical Care Clinical Pharmacist 12/13/2023 2:12 PM

## 2023-12-13 NOTE — Consult Note (Signed)
 Tyler Alvarez 07-15-1975  [id number].    Requesting MD: Dr. Reggie Grimes Chief Complaint/Reason for Consult: Buttock and back abscess  HPI: Tyler Alvarez is a 48 y.o. with a history of HTN, AAA, seizure disorder, renal failure s/p renal transplant on prednisone  and tacrolimus , tuberosclerosis and prior buttock abscess that presented to the ED for right buttock pain.  Patient reports her last several weeks she has been having increasing pain around his R upper back and right buttocks.  He has tried warm compresses without relief.  He developed a fever and chills prompting his visit to the ED. Both abscesses were drained at [facility] in ED but seemed fluid have re accumulated.  Tmax 100.5 since arrival.  He is currently afebrile without tachycardia or hypotension.  WBC 17.6 --> 12.4. Lactic wnl. CT A/P without obvious abscess.  Here when I&D by ED provider of the right upper back and the right buttocks with purulent drainage from both.  He was admitted to [facility], started on abx and general surgery was asked to see. He is not on blood thinners.   ROS: As above, see hpi  Family History  Problem Relation Age of Onset   Hypothyroidism Mother    Cancer Mother 38       cervical   Hypertension Mother    COPD Mother    COPD Father 70       COPD   Ulcers Father    COPD Brother    Heart disease Brother    Cancer Other     Past Medical History:  Diagnosis Date   Abscess 03/22/2021   ADD (attention deficit disorder)    Anxiety    Ascending aortic aneurysm (HCC)    Benign skin lesion of neck    left neck cyst   Cellulitis 02/24/2021   Cellulitis and abscess of buttock    Chronic kidney disease    s/p transplant 2001   Community acquired pneumonia 01/03/2020   Family history of adverse reaction to anesthesia    Pt mother stated Father-I think that he stopped breathing, they called a code but he came through okay   GERD (gastroesophageal reflux disease)    Headache    Migraines    Heart murmur    Hypertension    Immune deficiency disorder (HCC)    Restless leg syndrome    Seizures (HCC)    treated by Dr. Lane, no seizures in years and years   Sepsis (HCC) 02/25/2021   Sepsis without acute organ dysfunction (HCC)    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HCC)    Wears glasses     Past Surgical History:  Procedure Laterality Date   AV FISTULA PLACEMENT     KIDNEY TRANSPLANT  2001   LESION EXCISION WITH COMPLEX REPAIR Left 05/24/2021   Procedure: EXCISION BENIGN LESION NECK 9CM, LAYERED CLOSURE NECK 10CM;  Surgeon: Arelia Filippo, MD;  Location: [facility] OR;  Service: Plastics;  Laterality: Left;   MASS EXCISION Left 12/11/2016   Procedure: EXCISION OF BENIGN LESION OF THE NECK WITH LAYERED CLOSURE;  Surgeon: Arelia Filippo, MD;  Location: [facility] OR;  Service: Plastics;  Laterality: Left;   MASS EXCISION     neck x2   MASS EXCISION Bilateral 04/01/2018   Procedure: excision benign lesions left neck and right and left cheek ( 4cm, 2cm, 1cm x3), layered closure neck <10cm, layered closure right cheek 1cm;  Surgeon: Arelia Filippo, MD;  Location: [facility] OR;  Service: Plastics;  Laterality: Bilateral;  left cheek lesion   MULTIPLE TOOTH EXTRACTIONS     NEPHRECTOMY     both removed in 1999, 3 months apart   REVISON OF ARTERIOVENOUS FISTULA Left 07/16/2014   Procedure: EXCISION ANEURYSMAL AREA OF LEFT ARTERIOVENOUS FISTULA;  Surgeon: Gaile LELON New, MD;  Location: [facility] OR;  Service: Vascular;  Laterality: Left;    Social History:  reports that he has never smoked. He has never used smokeless tobacco. He reports that he does not drink alcohol and does not use drugs.  Allergies:  Allergies  Allergen Reactions   Erythromycin Nausea And Vomiting   Sulfa  Antibiotics Itching and Nausea And Vomiting    Medications Prior to Admission  Medication Sig Dispense Refill   acetaminophen  (TYLENOL ) 500 MG tablet Take 1,000 mg by mouth daily as needed for mild pain, headache or fever.     amLODipine   (NORVASC ) 5 MG tablet Take 2.5 mg by mouth daily.     brompheniramine -pseudoephedrine (DIMETAPP) 1-15 MG/5ML ELIX Take 20 mLs by mouth daily as needed for allergies (Cold).     gabapentin  (NEURONTIN ) 300 MG capsule Take 1 capsule (300 mg total) by mouth at bedtime. 90 capsule 3   losartan  (COZAAR ) 25 MG tablet Take 12.5 mg by mouth daily.     ondansetron  (ZOFRAN -ODT) 4 MG disintegrating tablet Take 1 tablet (4 mg total) by mouth every 8 (eight) hours as needed for nausea or vomiting. 20 tablet 0   predniSONE  (DELTASONE ) 5 MG tablet Take 5 mg by mouth daily.      tacrolimus  (PROGRAF ) 1 MG capsule Take 2 mg by mouth 2 (two) times daily.      mirtazapine  (REMERON ) 7.5 MG tablet TAKE 1 TABLET BY MOUTH AT BEDTIME. 90 tablet 0     Physical Exam: Blood pressure 124/63, pulse 71, temperature 98 F (36.7 C), resp. rate 18, height 6' (1.829 m), weight 51.6 kg, SpO2 96%. General: pleasant, WD/WN male who is laying in bed in NAD HEENT: head is normocephalic, atraumatic.  Sclera are non-icteric.  Heart: regular, rate, and rhythm.   Lungs: CTAB, no wheezes, rhonchi, or rales noted.  Respiratory effort nonlabored Abd:  Soft, ND, NT, +BS.  GU: R buttock with presence of I&D with packing in place, purulent drainage present with surrounding erythema, heat and induration.  Back: R upper back with presence of I&D with packing in place, purulent drainage present with surrounding erythema, heat and inudration. See pictures for further details.            Results for orders placed or performed during the hospital encounter of 12/11/23 (from the past 48 hours)  CBC with Differential     Status: Abnormal   Collection Time: 12/11/23 10:20 PM  Result Value Ref Range   WBC 17.6 (H) 4.0 - 10.5 K/uL   RBC 3.66 (L) 4.22 - 5.81 MIL/uL   Hemoglobin 10.8 (L) 13.0 - 17.0 g/dL   HCT 65.3 (L) 60.9 - 47.9 %   MCV 94.5 80.0 - 100.0 fL   MCH 29.5 26.0 - 34.0 pg   MCHC 31.2 30.0 - 36.0 g/dL   RDW 87.1 88.4 - 84.4 %    Platelets 229 150 - 400 K/uL   nRBC 0.0 0.0 - 0.2 %   Neutrophils Relative % 85 %   Neutro Abs 15.1 (H) 1.7 - 7.7 K/uL   Lymphocytes Relative 6 %   Lymphs Abs 1.0 0.7 - 4.0 K/uL   Monocytes Relative 6 %   Monocytes Absolute 1.1 (H) 0.1 -  1.0 K/uL   Eosinophils Relative 2 %   Eosinophils Absolute 0.3 0.0 - 0.5 K/uL   Basophils Relative 0 %   Basophils Absolute 0.1 0.0 - 0.1 K/uL   Immature Granulocytes 1 %   Abs Immature Granulocytes 0.11 (H) 0.00 - 0.07 K/uL    Comment: Performed at [facility], [address], [place], [place] [address]  Basic metabolic panel     Status: Abnormal   Collection Time: 12/11/23 10:20 PM  Result Value Ref Range   Sodium 133 (L) 135 - 145 mmol/L   Potassium 5.0 3.5 - 5.1 mmol/L   Chloride 108 98 - 111 mmol/L   CO2 17 (L) 22 - 32 mmol/L   Glucose, Bld 118 (H) 70 - 99 mg/dL    Comment: Glucose reference range applies only to samples taken after fasting for at least 8 hours.   BUN 31 (H) 6 - 20 mg/dL   Creatinine, Ser 7.58 (H) 0.61 - 1.24 mg/dL   Calcium 9.0 8.9 - 89.6 mg/dL   GFR, Estimated 32 (L) >60 mL/min    Comment: (NOTE) Calculated using the CKD-EPI Creatinine Equation (2021)    Anion gap 8 5 - 15    Comment: Performed at [facility], [address] [place], [place] [address]  I-Stat CG4 Lactic Acid     Status: None   Collection Time: 12/11/23 10:25 PM  Result Value Ref Range   Lactic Acid, Venous 1.8 0.5 - 1.9 mmol/L  Blood culture (routine x 2)     Status: None (Preliminary result)   Collection Time: 12/12/23  3:45 AM   Specimen: BLOOD RIGHT FOREARM  Result Value Ref Range   Specimen Description BLOOD RIGHT FOREARM    Special Requests      BOTTLES DRAWN AEROBIC AND ANAEROBIC Blood Culture results may not be optimal due to an inadequate volume of blood received in culture bottles   Culture      NO GROWTH 1 DAY Performed at [facility], [address] [place], [place] [address]    Report Status PENDING    I-Stat CG4 Lactic Acid     Status: None   Collection Time: 12/12/23  8:37 AM  Result Value Ref Range   Lactic Acid, Venous 0.5 0.5 - 1.9 mmol/L  Blood culture (routine x 2)     Status: None (Preliminary result)   Collection Time: 12/12/23 11:01 AM   Specimen: BLOOD RIGHT ARM  Result Value Ref Range   Specimen Description BLOOD RIGHT ARM    Special Requests      BOTTLES DRAWN AEROBIC AND ANAEROBIC Blood Culture adequate volume   Culture      NO GROWTH < 24 HOURS Performed at [facility], [address], [place], [place] [address]    Report Status PENDING   CBC     Status: Abnormal   Collection Time: 12/12/23 11:01 AM  Result Value Ref Range   WBC 17.6 (H) 4.0 - 10.5 K/uL   RBC 3.41 (L) 4.22 - 5.81 MIL/uL   Hemoglobin 9.9 (L) 13.0 - 17.0 g/dL   HCT 69.9 (L) 60.9 - 47.9 %   MCV 88.0 80.0 - 100.0 fL   MCH 29.0 26.0 - 34.0 pg   MCHC 33.0 30.0 - 36.0 g/dL   RDW 86.9 88.4 - 84.4 %   Platelets 257 150 - 400 K/uL   nRBC 0.0 0.0 - 0.2 %    Comment: Performed at [facility], [address] [place], [place] [address]  Creatinine, serum     Status: Abnormal   Collection Time: 12/12/23 11:01 AM  Result Value Ref Range   Creatinine, Ser 3.32 (H) 0.61 - 1.24 mg/dL   GFR, Estimated 22 (L) >60 mL/min    Comment: (NOTE) Calculated using the CKD-EPI Creatinine Equation (2021) Performed at [facility], [address] [place], [place] [address]   CBC with Differential/Platelet     Status: Abnormal   Collection Time: 12/13/23  8:40 AM  Result Value Ref Range   WBC 12.4 (H) 4.0 - 10.5 K/uL   RBC 3.15 (L) 4.22 - 5.81 MIL/uL   Hemoglobin 9.3 (L) 13.0 - 17.0 g/dL   HCT 72.3 (L) 60.9 - 47.9 %   MCV 87.6 80.0 - 100.0 fL   MCH 29.5 26.0 - 34.0 pg   MCHC 33.7 30.0 - 36.0 g/dL   RDW 87.0 88.4 - 84.4 %   Platelets 246 150 - 400 K/uL   nRBC 0.0 0.0 - 0.2 %   Neutrophils Relative % 75 %   Neutro Abs 9.5 (H) 1.7 - 7.7 K/uL   Lymphocytes Relative 14 %   Lymphs Abs 1.8 0.7  - 4.0 K/uL   Monocytes Relative 6 %   Monocytes Absolute 0.7 0.1 - 1.0 K/uL   Eosinophils Relative 3 %   Eosinophils Absolute 0.4 0.0 - 0.5 K/uL   Basophils Relative 1 %   Basophils Absolute 0.1 0.0 - 0.1 K/uL   Immature Granulocytes 1 %   Abs Immature Granulocytes 0.07 0.00 - 0.07 K/uL    Comment: Performed at [facility], [address], [place], [place] [address]  Comprehensive metabolic panel     Status: Abnormal   Collection Time: 12/13/23  8:40 AM  Result Value Ref Range   Sodium 136 135 - 145 mmol/L   Potassium 4.9 3.5 - 5.1 mmol/L   Chloride 112 (H) 98 - 111 mmol/L   CO2 19 (L) 22 - 32 mmol/L   Glucose, Bld 114 (H) 70 - 99 mg/dL    Comment: Glucose reference range applies only to samples taken after fasting for at least 8 hours.   BUN 44 (H) 6 - 20 mg/dL   Creatinine, Ser 7.06 (H) 0.61 - 1.24 mg/dL   Calcium 8.8 (L) 8.9 - 10.3 mg/dL   Total Protein 6.0 (L) 6.5 - 8.1 g/dL   Albumin  2.6 (L) 3.5 - 5.0 g/dL   AST 13 (L) 15 - 41 U/L   ALT 15 0 - 44 U/L   Alkaline Phosphatase 130 (H) 38 - 126 U/L   Total Bilirubin 0.4 0.0 - 1.2 mg/dL   GFR, Estimated 25 (L) >60 mL/min    Comment: (NOTE) Calculated using the CKD-EPI Creatinine Equation (2021)    Anion gap 5 5 - 15    Comment: Performed at [facility], [address], [place], [place] [address]  Phosphorus     Status: None   Collection Time: 12/13/23  8:40 AM  Result Value Ref Range   Phosphorus 2.8 2.5 - 4.6 mg/dL    Comment: Performed at [facility], [address] [place], [place] [address]   No results found.  Anti-infectives (From admission, onward)    Start     Dose/Rate Route Frequency Ordered Stop   12/15/23 1500  vancomycin  (VANCOCIN ) IVPB 1000 mg/200 mL premix        1,000 mg 200 mL/hr over  60 Minutes Intravenous Every 48 hours 12/13/23 1417     12/14/23 1500  ceFEPIme  (MAXIPIME ) 2 g in sodium chloride  0.9 % 100 mL IVPB        2 g 200 mL/hr over 30 Minutes Intravenous Every 24 hours  12/13/23 1414     12/13/23 1500  vancomycin  (VANCOCIN ) 1,000 mg in sodium chloride  0.9 % 250 mL IVPB        1,000 mg 250 mL/hr over 60 Minutes Intravenous  Once 12/13/23 1404     12/13/23 1500  ceFEPIme  (MAXIPIME ) 2 g in sodium chloride  0.9 % 100 mL IVPB        2 g 200 mL/hr over 30 Minutes Intravenous  Once 12/13/23 1404 12/13/23 1529   12/12/23 0415  cefTRIAXone  (ROCEPHIN ) 2 g in sodium chloride  0.9 % 100 mL IVPB        2 g 200 mL/hr over 30 Minutes Intravenous  Once 12/12/23 0404 12/12/23 0659       Assessment/Plan Right upper back and Right buttock abscesses  Tyler Alvarez is a 48 y.o. male s/p renal transplant on prednisone  and tacrolimus , tuberosclerosis and prior buttock abscess that presented to the ED for R upper back and right buttock pain.  Patient reports her last several weeks she has been having increasing pain around his R upper back and right buttocks.  He has tried warm compresses without relief.  He developed a fever and chills prompting his visit to the ED.   Tmax 100.5 since arrival.  He is currently afebrile without tachycardia or hypotension.  WBC 17.6 --> 12.4. Lactic wnl. CT A/P without obvious abscess. Here when I&D by ED provider of the right upper back and the right buttocks with purulent drainage from both.  He was admitted to [facility], started on abx and general surgery was asked to see. He is not on blood thinners.   Patient stated that he has a low threshold for pain which would makes it challenging for I&D at [facility].    FEN - Regular  VTE - SCDs, subcutaneous heparin  ID - Cefepime  and Vancomycin  per pharmacy.   I reviewed nursing notes, ED provider notes, hospitalist notes, last 24 h vitals and pain scores, last 48 h intake and output, last 24 h labs and trends, and last 24 h imaging results.   Eulah Hammonds, [facility] 12/13/2023, 3:47 PM Please see Amion for pager number during day hours 7:00am-4:30pm

## 2023-12-13 NOTE — Progress Notes (Signed)
 [facility] ROUNDING NOTE Tyler Alvarez FMW:[id number]  DOB: Jan 08, 1976  DOA: 12/11/2023  PCP: Knute Thersia Bitters, FNP  12/13/2023,7:20 AM  LOS: 1 day    Code Status: Full code     from: Home current Dispo: Unclear    48 year old white male Renal transplant 2/2 polycystic kidney disease with 1 kidney removed 2021 on immunosuppressants prednisone  tacrolimus  Ectatic ascending aortic aneurysm last seen by CT surgery 05/2022 Most recent TEE 06/03/2022 negative Previous history of tubero-sclerosis Previous left buttock abscess 07/29/2022 4.4X 2.6 cm Rx doxycycline -seen in follow-up by [facility] surgery subsequently Seen at plastic surgery office by Dr. Arelia 11/28/2023 for multiple cysts of the left neck-was offered a right shoulder lesion excision  7/30 re-present [facility] boils on back/buttocks fever 100.5 feeling ill In ED incision and drainage performed 5 x 5 right buttock abscess 2 x 2 right shoulder abscess wound care consulted  BUN/creatinine 31/2.4 potassium 5, WBC 17 hemoglobin 10 platelet 229, BC X2 performed-lactic acid 1.8-->0.5 unclear wound culture performed   Plan  Sepsis on admission secondary to multiple abscesses in the setting of tuberous sclerosis General Surgery?  Plastic surgery--both services have seen the patient previously Continue for now ceftriaxone  and vancomycin  and narrow accordingly based on cultures Follow white count and will review wound in a.m. Continue pain control with Tylenol  continue gabapentin  300 at bedtime Offload area as best possible  Renal transplant 2021 [facility]-tacrolimus  prednisone  Prograf  level pending at this time continue prednisone  5 daily-given no hypotension or other significant symptoms do not feel stress dosing is required Continuing tacrolimus  2 mg twice daily  Leukocytosis Combination of infection as well as prednisone   [facility] Requires outpatient follow-up  HTN Continues on losartan  12.5 amlodipine   2.5   Data Reviewed:   Sodium 136 potassium 4.9 BUN/creatinine 44/2.9 Alk phos 138 AST ALT 13/15 White count down from 17.6-12.4 Hemoglobin 9.3 platelet 246 Tacrolimus  level pending  DVT prophylaxis: Heparin   Status is: Inpatient Remains inpatient appropriate because:   Requires further workup     Subjective: Looks okay feels fair has many questions about the antibiotics that he is going to be on His mother is in the room--significant tenderness in his buttocks Not so tenderness upper back His left neck is not draining    Objective + exam Vitals:   12/12/23 0857 12/12/23 1639 12/12/23 1954 12/13/23 0430  BP: 117/76 109/69 (!) 148/79 (!) 141/84  Pulse: 89 66 83 73  Resp: 18 18 17 16   Temp: 99 F (37.2 C) 98.7 F (37.1 C) 98 F (36.7 C) 98.1 F (36.7 C)  TempSrc: Oral Oral Oral Oral  SpO2: 100% 100% 99% 98%  Weight:      Height:       Filed Weights   12/11/23 2118  Weight: 51.6 kg     Examination: EOMI NCAT no icterus no pallor Chest is clear Back wound is draining only slightly Buttock wound is draining a little bit more with serosanguineous purulent fluid over the right buttock Left neck seems stable Power 5/5     Scheduled Meds:  amLODipine   5 mg Oral Daily   feeding supplement  237 mL Oral BID BM   gabapentin   300 mg Oral QHS   heparin   5,000 Units Subcutaneous Q8H   losartan   12.5 mg Oral Daily   mirtazapine   7.5 mg Oral QHS   predniSONE   5 mg Oral Daily   tacrolimus   2 mg Oral BID   Continuous Infusions:  Time  70  Colen Grimes, MD  [facility]

## 2023-12-13 NOTE — Progress Notes (Signed)
 Mobility Specialist Progress Note:    12/13/23 1058  Mobility  Activity Ambulated with assistance (In hallway/ to BR)  Level of Assistance Standby assist, set-up cues, supervision of patient - no hands on  Assistive Device None  Distance Ambulated (ft) 80 ft  Activity Response Tolerated well  Mobility Referral Yes  Mobility visit 1 Mobility  Mobility Specialist Start Time (ACUTE ONLY) [id number]  Mobility Specialist Stop Time (ACUTE ONLY) 1005  Mobility Specialist Time Calculation (min) (ACUTE ONLY) 13 min   Received pt in bed and agreeable to mobility. No physical assistance required. Distance limited d/t fatigue, otherwise tolerated well. Returned to room and pt requested to use the BR. Assisted pt to bed with personal belongings and call light within reach. All needs met.  Lavanda Pollack Mobility Specialist  Please contact via [employer] or  Rehab Office [phone]

## 2023-12-13 NOTE — Evaluation (Signed)
 Occupational Therapy Evaluation Patient Details Name: Tyler Alvarez MRN: [id number] DOB: 06/23/1975 Today's Date: 12/13/2023   History of Present Illness   48 y.o. male presents to [facility] hospital on 12/11/2023 with fevers, nausea, vomiting and boils to R side of back and buttocks. PMH includes tuberosclerosis, s/p renal transplant in 2001, gout, CKD III, GERD, anemia, seizures.     Clinical Impressions Pt lives with his mom and relies on her for meal prep and housekeeping. He sometimes ambulates with a rollator when he is feeling weak. He reports falls due to hypotension and reports he self monitors his BP at home and adjusts medicine accordingly. Pt is typically independent in self care and gets down in the tub to bathe. Pt is overall functioning at up to a supervision level. Recommending tub seat in the event he is unable to immerse his wound and due to his history of hypotension in standing. Pt in agreement. Will follow acutely. Do not anticipate need for post acute OT.     If plan is discharge home, recommend the following:   Assistance with cooking/housework     Functional Status Assessment   Patient has had a recent decline in their functional status and demonstrates the ability to make significant improvements in function in a reasonable and predictable amount of time.     Equipment Recommendations   Tub/shower seat     Recommendations for Other Services         Precautions/Restrictions   Precautions Precautions: Fall Recall of Precautions/Restrictions: Intact Restrictions Weight Bearing Restrictions Per Provider Order: No     Mobility Bed Mobility Overal bed mobility: Independent                  Transfers Overall transfer level: Needs assistance Equipment used: None               General transfer comment: supervised due to history of hypotension and falls      Balance Overall balance assessment: Needs assistance   Sitting  balance-Leahy Scale: Normal     Standing balance support: No upper extremity supported, During functional activity Standing balance-Leahy Scale: Good                             ADL either performed or assessed with clinical judgement   ADL Overall ADL's : Needs assistance/impaired Eating/Feeding: Independent   Grooming: Wash/dry hands;Standing;Supervision/safety   Upper Body Bathing: Set up;Sitting   Lower Body Bathing: Supervison/ safety;Sit to/from stand   Upper Body Dressing : Set up;Sitting   Lower Body Dressing: Set up;Sitting/lateral leans   Toilet Transfer: Supervision/safety           Functional mobility during ADLs: Supervision/safety       Vision Baseline Vision/History: 0 No visual deficits Ability to See in Adequate Light: 0 Adequate Additional Comments: reading menu upon arrival     Perception         Praxis         Pertinent Vitals/Pain Pain Assessment Pain Assessment: Faces Faces Pain Scale: Hurts little more Pain Location: back and buttock at sites of incision and drainage Pain Descriptors / Indicators: Sore Pain Intervention(s): Monitored during session, Repositioned     Extremity/Trunk Assessment Upper Extremity Assessment Upper Extremity Assessment: Overall WFL for tasks assessed   Lower Extremity Assessment Lower Extremity Assessment: Defer to PT evaluation   Cervical / Trunk Assessment Cervical / Trunk Assessment: Normal   Communication Communication Communication: No  apparent difficulties   Cognition Arousal: Alert Behavior During Therapy: WFL for tasks assessed/performed Cognition: No apparent impairments             OT - Cognition Comments: pt is aware of his hypotension and monitors for symptoms                 Following commands: Intact       Cueing  General Comments   Cueing Techniques: Verbal cues      Exercises     Shoulder Instructions      Home Living Family/patient expects  to be discharged to:: Private residence Living Arrangements: Parent Available Help at Discharge: Family;Available 24 hours/day Type of Home: Mobile home Home Access: Stairs to enter Entrance Stairs-Number of Steps: 3 Entrance Stairs-Rails: Can reach both Home Layout: One level     Bathroom Shower/Tub: Chief Strategy Officer: Standard     Home Equipment: Agricultural consultant (2 wheels);Rollator (4 wheels);Cane - single point          Prior Functioning/Environment Prior Level of Function : Needs assist             Mobility Comments: pt reports PRN use of rollator on days when he feels less steady. limited mobility at home per pt's mother ADLs Comments: dependent on mother for meal prep and housekeeping, pt gets down in the tub to bathe    OT Problem List: Decreased knowledge of use of DME or AE;Pain   OT Treatment/Interventions: DME and/or AE instruction;Self-care/ADL training      OT Goals(Current goals can be found in the care plan section)   Acute Rehab OT Goals OT Goal Formulation: With patient Time For Goal Achievement: 12/27/23 Potential to Achieve Goals: Good   OT Frequency:  Min 1X/week    Co-evaluation              AM-PAC OT 6 Clicks Daily Activity     Outcome Measure Help from another person eating meals?: None Help from another person taking care of personal grooming?: A Little Help from another person toileting, which includes using toliet, bedpan, or urinal?: A Little Help from another person bathing (including washing, rinsing, drying)?: A Little Help from another person to put on and taking off regular upper body clothing?: None Help from another person to put on and taking off regular lower body clothing?: A Little 6 Click Score: 20   End of Session Equipment Utilized During Treatment: Gait belt  Activity Tolerance: Patient tolerated treatment well Patient left: in bed;with call bell/phone within reach;with family/visitor  present  OT Visit Diagnosis: Dizziness and giddiness (R42);Pain                Time: 0900-0919 OT Time Calculation (min): 19 min Charges:  OT General Charges $OT Visit: 1 Visit OT Evaluation $OT Eval Low Complexity: 1 Low  Mliss HERO, OTR/L Acute Rehabilitation Services Office: [phone]   Kennth Mliss Helling 12/13/2023, 10:11 AM

## 2023-12-13 NOTE — Plan of Care (Signed)

## 2023-12-13 NOTE — Progress Notes (Signed)
 Initial Nutrition Assessment  DOCUMENTATION CODES:  Underweight  INTERVENTION:  Multivitamin w/ minerals daily Calorie Count  Ensure Plus High Protein po BID, each supplement provides 350 kcal and 20 grams of protein Encourage good PO intake   NUTRITION DIAGNOSIS:  Increased nutrient needs related to wound healing as evidenced by estimated needs.  GOAL:  Patient will meet greater than or equal to 90% of their needs  MONITOR:  PO intake, Supplement acceptance, Weight trends, Skin, I & O's  REASON FOR ASSESSMENT:  Consult Assessment of nutrition requirement/status, Calorie Count  ASSESSMENT:  48 y.o. male presented to the ED with worsening R buttocks wound and fever. PMH includes GERD, HTN, CKD III s/p renal transplant, and gout. Pt admitted with R buttocks cellulitis.  7/29 -  Admitted 7/30 - s/p I&D buttocks & shoulder abscess  RD working remotely at time of visit. RD unable to reach pt in room via phone. Discussed starting calorie count with RN. RD requested that RN hang envelope on door to collect calorie count information. RD team will follow-up with results of calorie count tomorrow.   No meal intakes have been recorded this admission.   Per EMR, pt with a 11% weight loss within the past year; this is not clinically significant for time frame.   Nutrition Related Medications: Remeron , Prednisone  Labs: Sodium 136, Potassium 4.9, BUN 44, Creatinine 2.93, Phosphorus 2.8  NUTRITION - FOCUSED PHYSICAL EXAM: Deferred to follow-up.  Diet Order:   Diet Order             Diet regular Room service appropriate? Yes; Fluid consistency: Thin  Diet effective now                  EDUCATION NEEDS:  Not appropriate for education at this time  Skin:  Skin Assessment: Reviewed RN Assessment  Last BM:  Unknown  Height:  Ht Readings from Last 1 Encounters:  12/11/23 6' (1.829 m)   Weight:  Wt Readings from Last 1 Encounters:  12/11/23 51.6 kg   Ideal Body Weight:   80.9 kg  BMI:  Body mass index is 15.43 kg/m.  Estimated Nutritional Needs:  Kcal:  1700-1900 Protein:  85-105 grams Fluid:  >/= 1.7 L   Nestora Glatter RD, LDN Clinical Dietitian

## 2023-12-14 DIAGNOSIS — L0231 Cutaneous abscess of buttock: Secondary | ICD-10-CM | POA: Diagnosis not present

## 2023-12-14 LAB — COMPREHENSIVE METABOLIC PANEL WITH GFR
ALT: 20 U/L (ref 0–44)
AST: 22 U/L (ref 15–41)
Albumin: 2.5 g/dL — ABNORMAL LOW (ref 3.5–5.0)
Alkaline Phosphatase: 131 U/L — ABNORMAL HIGH (ref 38–126)
Anion gap: 7 (ref 5–15)
BUN: 43 mg/dL — ABNORMAL HIGH (ref 6–20)
CO2: 19 mmol/L — ABNORMAL LOW (ref 22–32)
Calcium: 9 mg/dL (ref 8.9–10.3)
Chloride: 112 mmol/L — ABNORMAL HIGH (ref 98–111)
Creatinine, Ser: 2.73 mg/dL — ABNORMAL HIGH (ref 0.61–1.24)
GFR, Estimated: 28 mL/min — ABNORMAL LOW (ref >60)
Glucose, Bld: 120 mg/dL — ABNORMAL HIGH (ref 70–99)
Potassium: 4.9 mmol/L (ref 3.5–5.1)
Sodium: 138 mmol/L (ref 135–145)
Total Bilirubin: 0.4 mg/dL (ref 0.0–1.2)
Total Protein: 5.9 g/dL — ABNORMAL LOW (ref 6.5–8.1)

## 2023-12-14 LAB — CBC WITH DIFFERENTIAL/PLATELET
Abs Immature Granulocytes: 0.05 K/uL (ref 0.00–0.07)
Basophils Absolute: 0 K/uL (ref 0.0–0.1)
Basophils Relative: 0 %
Eosinophils Absolute: 0.4 K/uL (ref 0.0–0.5)
Eosinophils Relative: 4 %
HCT: 26.7 % — ABNORMAL LOW (ref 39.0–52.0)
Hemoglobin: 9 g/dL — ABNORMAL LOW (ref 13.0–17.0)
Immature Granulocytes: 1 %
Lymphocytes Relative: 21 %
Lymphs Abs: 2 K/uL (ref 0.7–4.0)
MCH: 29.7 pg (ref 26.0–34.0)
MCHC: 33.7 g/dL (ref 30.0–36.0)
MCV: 88.1 fL (ref 80.0–100.0)
Monocytes Absolute: 0.6 K/uL (ref 0.1–1.0)
Monocytes Relative: 6 %
Neutro Abs: 6.6 K/uL (ref 1.7–7.7)
Neutrophils Relative %: 68 %
Platelets: 251 K/uL (ref 150–400)
RBC: 3.03 MIL/uL — ABNORMAL LOW (ref 4.22–5.81)
RDW: 12.8 % (ref 11.5–15.5)
WBC: 9.8 K/uL (ref 4.0–10.5)
nRBC: 0 % (ref 0.0–0.2)

## 2023-12-14 MED ORDER — AMOXICILLIN-POT CLAVULANATE 500-125 MG PO TABS
1.0000 | ORAL_TABLET | Freq: Two times a day (BID) | ORAL | Status: DC
  Administered 2023-12-14 – 2023-12-15 (×3): 1 via ORAL
  Filled 2023-12-14 (×3): qty 1

## 2023-12-14 MED ORDER — ALUM & MAG HYDROXIDE-SIMETH 200-200-20 MG/5ML PO SUSP: 30.0000 mL | Freq: Four times a day (QID) | ORAL | Status: DC | PRN

## 2023-12-14 NOTE — TOC Progression Note (Signed)
 Transition of Care [facility]) - Progression Note    Patient Details  Name: Tyler Alvarez MRN: [id number] Date of Birth: May 19, 1975  [facility] of [facility]) CM/SW Contact  Roxie KANDICE Stain, RN Phone Number: 12/14/2023, 3:21 PM  Clinical Narrative:     PT recommends shower seat. Patient is agreeable. Notified Zack with adapt of order.                    Expected Discharge Plan and Services                                               Social Drivers of Health (SDOH) Interventions SDOH Screenings   Food Insecurity: No Food Insecurity (12/12/2023)  Housing: Low Risk  (12/12/2023)  Transportation Needs: No Transportation Needs (12/12/2023)  Utilities: Not At Risk (12/12/2023)  Alcohol Screen: Low Risk  (12/15/2022)  Depression (PHQ2-9): Low Risk  (10/15/2023)  Financial Resource Strain: Low Risk  (12/15/2022)  Physical Activity: Insufficiently Active (12/15/2022)  Social Connections: Socially Isolated (12/15/2022)  Stress: No Stress Concern Present (12/15/2022)  Tobacco Use: Low Risk  (11/28/2023)   Received from [facility]  Health Literacy: Adequate Health Literacy (12/15/2022)    Readmission Risk Interventions    07/31/2023    2:02 PM  Readmission Risk Prevention Plan  Transportation Screening Complete  PCP or Specialist Appt within 3-5 Days Complete  HRI or Home Care Consult Complete  Social Work Consult for Recovery Care Planning/Counseling Complete  Palliative Care Screening Not Applicable  Medication Review Oceanographer) Referral to Pharmacy

## 2023-12-14 NOTE — Consult Note (Signed)
 WOC Nurse Consult Note:  Noted new WOC consult.  WOC has previously consulted  on 7/30, see note for details and recommendations.  Will not re consult at this time.   Thank you,  Doyal Polite, RN, MSN, [facility] WOC Team

## 2023-12-14 NOTE — Progress Notes (Signed)
 Mobility Specialist Progress Note:   12/14/23 1351  Mobility  Activity Ambulated with assistance  Level of Assistance Contact guard assist, steadying assist  Assistive Device None  Distance Ambulated (ft) 80 ft  Activity Response Tolerated well  Mobility Referral Yes  Mobility visit 1 Mobility  Mobility Specialist Start Time (ACUTE ONLY) 1016  Mobility Specialist Stop Time (ACUTE ONLY) 1026  Mobility Specialist Time Calculation (min) (ACUTE ONLY) 10 min   Pt received in bed agreeable to mobility. Pt c/o mild unsteadiness, otherwise tolerated well. Returned to room w/o fault. Left in bed w/ call bell and personal belongings in reach. All needs met. Mother in room.  Thersia Minder Mobility Specialist  Please contact vis [employer] or  Rehab Office [phone]

## 2023-12-14 NOTE — Consult Note (Signed)
 Subjective: CC: NAEO, minimal pain at abscess sites.   Objective: Vital signs in last 24 hours: Temp:  [97.4 F (36.3 C)-98.7 F (37.1 C)] 97.4 F (36.3 C) (08/01 0834) Pulse Rate:  [62-77] 62 (08/01 0834) Resp:  [16-18] 16 (08/01 0834) BP: (114-131)/(67-75) 114/67 (08/01 0834) SpO2:  [97 %-98 %] 98 % (08/01 0834)    Intake/Output from previous day: No intake/output data recorded. Intake/Output this shift: No intake/output data recorded.  PE:  General: pleasant, WD/WN male who is laying in bed in NAD HEENT: head is normocephalic, atraumatic.  Sclera are non-icteric.  Heart: regular, rate, and rhythm.   Lungs: CTAB, no wheezes, rhonchi, or rales noted.  Respiratory effort nonlabored Abd:  Soft, ND, NT, +BS.  GU: R buttock with evidence of prior I&D, purulent drainage is present with surrounding erythema, heat and induration.  Back: R upper back with evidence of prior I&D, purulent drainage still present with surrounding erythema, heat and inudration.    Lab Results:  Recent Labs    12/13/23 0840 12/14/23 0453  WBC 12.4* 9.8  HGB 9.3* 9.0*  HCT 27.6* 26.7*  PLT 246 251   BMET Recent Labs    12/13/23 0840 12/14/23 0453  NA 136 138  K 4.9 4.9  CL 112* 112*  CO2 19* 19*  GLUCOSE 114* 120*  BUN 44* 43*  CREATININE 2.93* 2.73*  CALCIUM 8.8* 9.0   PT/INR No results for input(s): LABPROT, INR in the last 72 hours. CMP     Component Value Date/Time   NA 138 12/14/2023 0453   NA 138 01/16/2020 1112   K 4.9 12/14/2023 0453   CL 112 (H) 12/14/2023 0453   CO2 19 (L) 12/14/2023 0453   GLUCOSE 120 (H) 12/14/2023 0453   BUN 43 (H) 12/14/2023 0453   BUN 30 (H) 01/16/2020 1112   CREATININE 2.73 (H) 12/14/2023 0453   CALCIUM 9.0 12/14/2023 0453   PROT 5.9 (L) 12/14/2023 0453   ALBUMIN  2.5 (L) 12/14/2023 0453   AST 22 12/14/2023 0453   ALT 20 12/14/2023 0453   ALKPHOS 131 (H) 12/14/2023 0453   BILITOT 0.4 12/14/2023 0453   GFRNONAA 28 (L)  12/14/2023 0453   GFRAA 45 (L) 01/16/2020 1112   Lipase     Component Value Date/Time   LIPASE 35 07/30/2023 0907    Studies/Results: No results found.  Anti-infectives: Anti-infectives (From admission, onward)    Start     Dose/Rate Route Frequency Ordered Stop   12/15/23 1500  vancomycin  (VANCOCIN ) IVPB 1000 mg/200 mL premix        1,000 mg 200 mL/hr over 60 Minutes Intravenous Every 48 hours 12/13/23 1417     12/14/23 1500  ceFEPIme  (MAXIPIME ) 2 g in sodium chloride  0.9 % 100 mL IVPB        2 g 200 mL/hr over 30 Minutes Intravenous Every 24 hours 12/13/23 1414     12/13/23 1500  vancomycin  (VANCOCIN ) 1,000 mg in sodium chloride  0.9 % 250 mL IVPB        1,000 mg 250 mL/hr over 60 Minutes Intravenous  Once 12/13/23 1404 12/13/23 1750   12/13/23 1500  ceFEPIme  (MAXIPIME ) 2 g in sodium chloride  0.9 % 100 mL IVPB        2 g 200 mL/hr over 30 Minutes Intravenous  Once 12/13/23 1404 12/13/23 1529   12/12/23 0415  cefTRIAXone  (ROCEPHIN ) 2 g in sodium chloride  0.9 % 100 mL IVPB  2 g 200 mL/hr over 30 Minutes Intravenous  Once 12/12/23 0404 12/12/23 0659        Assessment/Plan Right upper back and Right buttock abscesses    - Continue IV abx - I recommend a wound care consult. No role for surgical intervention at this time. Surgery team will S/O and will be available for further consult if needed.  -Continue monitoring CBC, WBC this AM down to 9.8 K from 12.4 K yesterday.   FEN - Regular VTE - SCDs, SubQ heparin  ID - Cefepime  and Vancomycin  per pharmacy.    I reviewed nursing notes, hospitalist notes, last 24 h vitals and pain scores, last 48 h intake and output, last 24 h labs and trends, and last 24 h imaging results.    LOS: 2 days    Eulah Hammonds, [facility] 12/14/2023, 8:43 AM Please see [employer] for pager number during day hours 7:00am-4:30pm

## 2023-12-14 NOTE — Progress Notes (Signed)
 Calorie Count Note  48-hour calorie count ordered yesterday to start at breakfast meal. Please see day 1 results below. Majority of documentation for meals yesterday is unavailable. Plan to restart calorie count today. RD to follow up with results on Monday, 12/17/23.  Diet: Regular, thin liquids Supplements:  - Ensure Plus High Protein BID  12/13/23: Breakfast: meal slip in calorie count envelope but no documentation regarding intake Lunch: no information available Dinner: no information available Supplements: 350 kcal, 20 grams of protein  Total 24-hour intake: 350 kcal (21% of minimum estimated needs)  20 grams of protein (24% of minimum estimated needs)  Nutrition Diagnosis: Increased nutrient needs related to wound healing as evidenced by estimated needs.  Goal: Patient will meet greater than or equal to 90% of their needs.  Intervention:  - Restart 48-hour calorie count, instructions for calorie count added to order and additional nursing care order placed - Continue MVI with minerals daily - Continue Ensure Plus High Protein po BID, each supplement provides 350 kcal and 20 grams of protein   Mallie Satchel, MS, RD, LDN Registered Dietitian II Please see [employer] for contact information.

## 2023-12-14 NOTE — Progress Notes (Signed)
 TRH ROUNDING NOTE [name] FMW:[id number]  DOB: 1976-04-06  DOA: 12/11/2023  PCP: Knute Thersia Bitters, FNP  12/14/2023,12:36 PM  LOS: 2 days    Code Status: Full code     from: Home current Dispo: Unclear    48 year old white male Renal transplant 2/2 polycystic kidney disease with 1 kidney removed 2021 on immunosuppressants prednisone  tacrolimus  Ectatic ascending aortic aneurysm last seen by CT surgery 05/2022 Most recent TEE 06/03/2022 negative Previous history of tubero-sclerosis Previous left buttock abscess 07/29/2022 4.4X 2.6 cm Rx doxycycline -seen in follow-up by [facility] surgery subsequently Seen at plastic surgery office by Dr. Arelia 11/28/2023 for multiple cysts of the left neck-was offered a right shoulder lesion excision  7/30 re-present [facility] boils on back/buttocks fever 100.5 feeling ill In ED incision and drainage performed 5 x 5 right buttock abscess 2 x 2 right shoulder abscess wound care consulted  BUN/creatinine 31/2.4 potassium 5, WBC 17 hemoglobin 10 platelet 229, BC X2 performed-lactic acid 1.8-->0.5 unclear wound culture performed   Plan  Sepsis on admission secondary to multiple abscesses in the setting of tuberous sclerosis General Surgery?  Plastic surgery--both services have seen the patient previously Narrowed IV Ceph/Vanc to p.o. Augmentin  EOT 14 days 12/24/2023--appreciate of general surgery insight no need for surgery --asking wound care to see-needs close follow-up outpatient Continue pain control with Tylenol  continue gabapentin  300 at bedtime  Renal transplant 2021 [facility]-tacrolimus  prednisone  Prograf  level still pending at this time  Continue prednisone  5 daily-given no hypotension or other significant symptoms do not feel stress dosing is required Continuing tacrolimus  2 mg twice daily  Leukocytosis Combination of infection as well as prednisone   Ectatic ascending aorta [facility] at [facility] Requires outpatient follow-up  HTN Continues  amlodipine  2.5 Will hold losartan  for the time being and likely consider resumption as outpatient  Data Reviewed:   Sodium 138 potassium 4.8 BUN/creatinine 43/2.7 which is improved Alk phos 131 LFTs normal WBC 9.8 hemoglobin 9.0  DVT prophylaxis: Heparin   Status is: Inpatient Remains inpatient appropriate because:   Requires further workup     Subjective:  Overall looks well Did not eat lunch yet as was sleeping No chest pain No fever--- overall feels stronger and better than he did yesterday    Objective + exam Vitals:   12/13/23 1609 12/13/23 2006 12/14/23 0413 12/14/23 0834  BP: 125/68 131/69 128/75 114/67  Pulse: 71 77 64 62  Resp:  18 18 16   Temp: 98.4 F (36.9 C) 98.7 F (37.1 C) 97.9 F (36.6 C) (!) 97.4 F (36.3 C)  TempSrc: Oral Oral Oral Oral  SpO2: 98% 98% 97% 98%  Weight:      Height:       Filed Weights   12/11/23 2118  Weight: 51.6 kg     Examination: EOMI NCAT Chest clear no wheeze rales rhonchi Abdomen soft no rebound no guarding ROM intact moving 4 limbs    Scheduled Meds:  amLODipine   5 mg Oral Daily   feeding supplement  237 mL Oral BID BM   gabapentin   300 mg Oral QHS   heparin   5,000 Units Subcutaneous Q8H   mirtazapine   7.5 mg Oral QHS   multivitamin with minerals  1 tablet Oral Daily   predniSONE   5 mg Oral Daily   tacrolimus   2 mg Oral BID   Continuous Infusions:  ceFEPime  (MAXIPIME ) IV     [START ON 12/15/2023] vancomycin       Time 20  Jai-Gurmukh Armour Villanueva, MD  [facility]

## 2023-12-14 NOTE — Plan of Care (Signed)

## 2023-12-14 NOTE — Progress Notes (Signed)
 Occupational Therapy Treatment Patient Details Name: Tyler Alvarez MRN: [id number] DOB: 29-Dec-1975 Today's Date: 12/14/2023   History of present illness 48 y.o. male presents to [facility] hospital on 12/11/2023 with fevers, nausea, vomiting and boils to R side of back and buttocks. PMH includes tuberosclerosis, s/p renal transplant in 2001, gout, CKD III, GERD, anemia, seizures.   OT comments  Pt is routinely walking to bathroom without s/s of hypotension using RW and completing basic ADLs with set up. Pt agrees to use of shower seat when he returns home and is allowed to get wounds wet instead of getting down in tub or attempting to stand entire shower, case manager notified. Pt reports feeling much better than upon admission.       If plan is discharge home, recommend the following:  Assistance with cooking/housework   Equipment Recommendations  Tub/shower seat    Recommendations for Other Services      Precautions / Restrictions Precautions Precautions: Fall Recall of Precautions/Restrictions: Intact Precaution/Restrictions Comments: h/o orthostaic hypotension Restrictions Weight Bearing Restrictions Per Provider Order: No       Mobility Bed Mobility Overal bed mobility: Independent                  Transfers Overall transfer level: Modified independent Equipment used: Rolling walker (2 wheels)                     Balance Overall balance assessment: Needs assistance Sitting-balance support: No upper extremity supported, Feet supported Sitting balance-Leahy Scale: Normal     Standing balance support: No upper extremity supported, During functional activity Standing balance-Leahy Scale: Good                             ADL either performed or assessed with clinical judgement   ADL Overall ADL's : Modified independent                                     Functional mobility during ADLs: Modified independent;Rolling walker (2  wheels) General ADL Comments: pt is agreeable to shower seat, CM notified    Extremity/Trunk Assessment              Vision       Perception     Praxis     Communication Communication Communication: No apparent difficulties   Cognition Arousal: Alert Behavior During Therapy: WFL for tasks assessed/performed Cognition: No apparent impairments                               Following commands: Intact        Cueing   Cueing Techniques: Verbal cues  Exercises      Shoulder Instructions       General Comments      Pertinent Vitals/ Pain       Pain Assessment Pain Assessment: Faces Faces Pain Scale: Hurts little more Pain Location: back and buttock at sites of incision and drainage Pain Descriptors / Indicators: Sore Pain Intervention(s): Monitored during session  Home Living                                          Prior Functioning/Environment  Frequency  Min 1X/week        Progress Toward Goals  OT Goals(current goals can now be found in the care plan section)  Progress towards OT goals: Progressing toward goals  Acute Rehab OT Goals OT Goal Formulation: With patient Time For Goal Achievement: 12/27/23 Potential to Achieve Goals: Good  Plan      Co-evaluation                 AM-PAC OT 6 Clicks Daily Activity     Outcome Measure   Help from another person eating meals?: None Help from another person taking care of personal grooming?: None Help from another person toileting, which includes using toliet, bedpan, or urinal?: None Help from another person bathing (including washing, rinsing, drying)?: None Help from another person to put on and taking off regular upper body clothing?: None Help from another person to put on and taking off regular lower body clothing?: None 6 Click Score: 24    End of Session Equipment Utilized During Treatment: Rolling walker (2 wheels);Gait  belt  OT Visit Diagnosis: Unsteadiness on feet (R26.81)   Activity Tolerance Patient tolerated treatment well   Patient Left in bed;with call bell/phone within reach;with family/visitor present   Nurse Communication          Time: 1435-1455 OT Time Calculation (min): 20 min  Charges: OT General Charges $OT Visit: 1 Visit OT Treatments $Self Care/Home Management : 8-22 mins  Mliss HERO, OTR/L Acute Rehabilitation Services Office: [phone]   Kennth Mliss Helling 12/14/2023, 3:04 PM

## 2023-12-14 NOTE — Plan of Care (Signed)

## 2023-12-15 ENCOUNTER — Other Ambulatory Visit (HOSPITAL_COMMUNITY): Payer: Self-pay

## 2023-12-15 DIAGNOSIS — L0231 Cutaneous abscess of buttock: Secondary | ICD-10-CM | POA: Diagnosis not present

## 2023-12-15 LAB — CBC WITH DIFFERENTIAL/PLATELET
Abs Immature Granulocytes: 0.07 K/uL (ref 0.00–0.07)
Basophils Absolute: 0 K/uL (ref 0.0–0.1)
Basophils Relative: 0 %
Eosinophils Absolute: 0.4 K/uL (ref 0.0–0.5)
Eosinophils Relative: 4 %
HCT: 29.6 % — ABNORMAL LOW (ref 39.0–52.0)
Hemoglobin: 9.8 g/dL — ABNORMAL LOW (ref 13.0–17.0)
Immature Granulocytes: 1 %
Lymphocytes Relative: 17 %
Lymphs Abs: 1.5 K/uL (ref 0.7–4.0)
MCH: 29.2 pg (ref 26.0–34.0)
MCHC: 33.1 g/dL (ref 30.0–36.0)
MCV: 88.1 fL (ref 80.0–100.0)
Monocytes Absolute: 0.3 K/uL (ref 0.1–1.0)
Monocytes Relative: 4 %
Neutro Abs: 6.9 K/uL (ref 1.7–7.7)
Neutrophils Relative %: 74 %
Platelets: 292 K/uL (ref 150–400)
RBC: 3.36 MIL/uL — ABNORMAL LOW (ref 4.22–5.81)
RDW: 12.7 % (ref 11.5–15.5)
WBC: 9.2 K/uL (ref 4.0–10.5)
nRBC: 0 % (ref 0.0–0.2)

## 2023-12-15 LAB — COMPREHENSIVE METABOLIC PANEL WITH GFR
ALT: 27 U/L (ref 0–44)
AST: 30 U/L (ref 15–41)
Albumin: 2.7 g/dL — ABNORMAL LOW (ref 3.5–5.0)
Alkaline Phosphatase: 149 U/L — ABNORMAL HIGH (ref 38–126)
Anion gap: 7 (ref 5–15)
BUN: 35 mg/dL — ABNORMAL HIGH (ref 6–20)
CO2: 18 mmol/L — ABNORMAL LOW (ref 22–32)
Calcium: 9.7 mg/dL (ref 8.9–10.3)
Chloride: 113 mmol/L — ABNORMAL HIGH (ref 98–111)
Creatinine, Ser: 2.17 mg/dL — ABNORMAL HIGH (ref 0.61–1.24)
GFR, Estimated: 36 mL/min — ABNORMAL LOW (ref >60)
Glucose, Bld: 128 mg/dL — ABNORMAL HIGH (ref 70–99)
Potassium: 4.7 mmol/L (ref 3.5–5.1)
Sodium: 138 mmol/L (ref 135–145)
Total Bilirubin: 0.4 mg/dL (ref 0.0–1.2)
Total Protein: 6.5 g/dL (ref 6.5–8.1)

## 2023-12-15 LAB — TACROLIMUS LEVEL: Tacrolimus (FK506) - LabCorp: 4 ng/mL — ABNORMAL LOW (ref 5.0–20.0)

## 2023-12-15 MED ORDER — AMLODIPINE BESYLATE 5 MG PO TABS: 5.0000 mg | ORAL_TABLET | Freq: Every day | ORAL | Status: AC

## 2023-12-15 MED ORDER — LOSARTAN POTASSIUM 25 MG PO TABS: 12.5000 mg | ORAL_TABLET | Freq: Every day | ORAL | Status: AC

## 2023-12-15 MED ORDER — AMOXICILLIN-POT CLAVULANATE 500-125 MG PO TABS
1.0000 | ORAL_TABLET | Freq: Two times a day (BID) | ORAL | 0 refills | Status: AC
  Filled 2023-12-15: qty 18, 9d supply, fill #0

## 2023-12-15 NOTE — Progress Notes (Signed)
 Upon entering pt's room for medication; pt expressed concern that his bp is high. Checked parameters of order and explained to pt that we will treat with bp meds if systolic above 160. Pt expressed frustration they need to listen to me, it's my body Advised that I will have day shift give med at 09:00 instead of 10:00.  Pt states he wants his bp med now. Advised I can not give now according to order, but I can have dayshift give it early. Upon further discussion; pt is worried about aortic pressure. MD to discuss with him on rounds please.

## 2023-12-15 NOTE — Discharge Summary (Signed)
 Physician Discharge Summary  Tyler Alvarez FMW:[id number] DOB: 1975-10-16 DOA: 12/11/2023  PCP: Knute Thersia Bitters, FNP  Admit date: 12/11/2023 Discharge date: 12/15/2023  Time spent: 20 minutes  Recommendations for Outpatient Follow-up:  Needs renal panel CBC about 1 week and close follow-up with Dr. Malvin CC him nephrology regarding med adjustments Complete Augmentin  going forward Recommend wound care follow-up with Dr. Arelia in the outpatient setting CC her Outpatient interval follow-up for ectatic ascending aortic aneurysm with possible TTE with Charmaine Colt, [facility] as able  Discharge Diagnoses:  MAIN problem for hospitalization   Cellulitis of small multiple abscesses Immunocompromise state secondary to immunomodulators for kidney disease  Please see below for itemized issues addressed in HOpsital- refer to other progress notes for clarity if needed  Discharge Condition: Improved  Diet recommendation: Renal  Filed Weights   12/11/23 2118  Weight: 51.6 kg    History of present illness:   48 year old white male-lives with mom does not drive needs a walker Renal transplant 2/2 polycystic kidney disease with 1 kidney removed 2021 on immunosuppressants prednisone  tacrolimus  Ectatic ascending aortic aneurysm last seen by CT surgery 05/2022 Most recent TEE 06/03/2022 negative Previous history of tubero-sclerosis Previous left buttock abscess 07/29/2022 4.4X 2.6 cm Rx doxycycline -seen in follow-up by [facility] subsequently Seen at plastic surgery office by Dr. Arelia 11/28/2023 for multiple cysts of the left neck-was offered a right shoulder lesion excision   7/30 re-present [facility] boils on back/buttocks fever 100.5 feeling ill In ED incision and drainage performed 5 x 5 right buttock abscess 2 x 2 right shoulder abscess wound care consulted   BUN/creatinine 31/2.4 potassium 5, WBC 17 hemoglobin 10 platelet 229, BC X2 performed-lactic acid  1.8-->0.5 unclear wound culture performed     Plan   Sepsis on admission secondary to multiple abscesses in the setting of tuberous sclerosis General Surgery?  Plastic surgery--both services have seen the patient previously Narrowed IV Ceph/Vanc to p.o. Augmentin  EOT 14 days 12/24/2023--appreciate of general surgery insight no need for surgery --wound care instructions as below Continue pain control with Tylenol  continue gabapentin  300 at bedtime Patient has a chronic left sternocleidomastoid wound/area that needs to be readdressed by Dr. Arelia and she can probably review his back and buttock wounds at with same time on follow-up    Renal transplant 2021 [facility]-tacrolimus  prednisone  Tacrolimus  levels 4.0 although close to range of 5.0-20-suggest outpatient nephrology follow-up Continue prednisone  5 daily- Continuing tacrolimus  2 mg twice daily Dr. Marlee sees him as an outpatient and I have asked that the patient follow-up with him in 1 to 2 weeks and call his   Leukocytosis Combination of infection as well as prednisone -resolved on discharge   Ectatic ascending aorta followed [facility] Requires outpatient follow-up   HTN Continues amlodipine  2.5 Losartan  to be resumed-patient apparently is taking amlodipine  losartan  in varying doses as per his admission because he sometimes gets stressed and I have explained to him that he should not use these medications as needed-I have asked him to discuss this with cardiology/nephrology and asked him to follow-up with nephrologist in 1 week with labs  Discharge Exam: Vitals:   12/15/23 0420 12/15/23 0734  BP: (!) 156/80 (!) 151/90  Pulse: 78   Resp: 18   Temp: 98.5 F (36.9 C)   SpO2: 100%     Subj on day of d/c   Awake coherent no distress  General Exam on discharge  EOMI NCAT no focal deficit no icterus no pallor no wheeze  no rales no rhonchi Chest is clear ROM is intact Power 5/5 No lower extremity edema Buttock wound seems  completely closed, back wound looks better and has dressing in place  Discharge Instructions   Discharge Instructions     Diet - low sodium heart healthy   Complete by: As directed    Discharge instructions   Complete by: As directed    Complete full course of Augmentin  do not miss any doses-take probiotics with this or natural fermented foods such as yogurt or kimchi  see below wound care instructions and continue the wound care until he can be followed in the outpatient by Dr. Arelia who you will have to call to be seen in about a week for your left neck wound additionally Please get the shower bench as you see fit Resume your losartan  in 1 to 2 days as per instructions-you were somewhat dehydrated when you were admitted  Please call Dr. Marlee office regarding your kidney function etc. and he will follow-up your tacrolimus  level etc. going forward and adjust your meds for your blood pressure as needed   Discharge wound care:   Complete by: As directed    Wound care  Daily      Comments: Daily; flush with saline (shoulder and buttock wounds); pat dry Repack with 1/2 iodoform packing strip Soila # [phone]), may need to use stick end of sterile applicator. Top with dry dressing. Change daily.  Teach CG to perform  12/12/23 0926   Increase activity slowly   Complete by: As directed       Allergies as of 12/15/2023       Reactions   Erythromycin Nausea And Vomiting   Sulfa  Antibiotics Itching, Nausea And Vomiting        Medication List     STOP taking these medications    brompheniramine -pseudoephedrine 1-15 MG/5ML Elix Commonly known as: DIMETAPP       TAKE these medications    acetaminophen  500 MG tablet Commonly known as: TYLENOL  Take 1,000 mg by mouth daily as needed for mild pain, headache or fever.   amLODipine  5 MG tablet Commonly known as: NORVASC  Take 1 tablet (5 mg total) by mouth daily. What changed: how much to take   amoxicillin -clavulanate  500-125 MG tablet Commonly known as: AUGMENTIN  Take 1 tablet by mouth every 12 (twelve) hours for 9 days.   gabapentin  300 MG capsule Commonly known as: NEURONTIN  Take 1 capsule (300 mg total) by mouth at bedtime.   losartan  25 MG tablet Commonly known as: COZAAR  Take 0.5 tablets (12.5 mg total) by mouth daily. Start taking on: December 18, 2023 What changed: These instructions start on December 18, 2023. If you are unsure what to do until then, ask your doctor or other care provider.   mirtazapine  7.5 MG tablet Commonly known as: REMERON  TAKE 1 TABLET BY MOUTH AT BEDTIME.   ondansetron  4 MG disintegrating tablet Commonly known as: ZOFRAN -ODT Take 1 tablet (4 mg total) by mouth every 8 (eight) hours as needed for nausea or vomiting.   predniSONE  5 MG tablet Commonly known as: DELTASONE  Take 5 mg by mouth daily.   tacrolimus  1 MG capsule Commonly known as: PROGRAF  Take 2 mg by mouth 2 (two) times daily.               Durable Medical Equipment  (From admission, onward)           Start     Ordered   12/14/23 1521  For home use only DME Other see comment  Once       Comments: Shower seat  Question:  Length of Need  Answer:  Lifetime   12/14/23 1520              Discharge Care Instructions  (From admission, onward)           Start     Ordered   12/15/23 0000  Discharge wound care:       Comments: Wound care  Daily      Comments: Daily; flush with saline (shoulder and buttock wounds); pat dry Repack with 1/2 iodoform packing strip Soila # [phone]), may need to use stick end of sterile applicator. Top with dry dressing. Change daily.  Teach CG to perform  12/12/23 0926   12/15/23 0819           Allergies  Allergen Reactions   Erythromycin Nausea And Vomiting   Sulfa  Antibiotics Itching and Nausea And Vomiting      The results of significant diagnostics from this hospitalization (including imaging, microbiology, ancillary and laboratory) are  listed below for reference.    Significant Diagnostic Studies: No results found.  Microbiology: Recent Results (from the past 240 hours)  Blood culture (routine x 2)     Status: None (Preliminary result)   Collection Time: 12/12/23  3:45 AM   Specimen: BLOOD RIGHT FOREARM  Result Value Ref Range Status   Specimen Description BLOOD RIGHT FOREARM  Final   Special Requests   Final    BOTTLES DRAWN AEROBIC AND ANAEROBIC Blood Culture results may not be optimal due to an inadequate volume of blood received in culture bottles   Culture   Final    NO GROWTH 2 DAYS Performed at [facility], [address], [place], [place] [address]    Report Status PENDING  Incomplete  Blood culture (routine x 2)     Status: None (Preliminary result)   Collection Time: 12/12/23 11:01 AM   Specimen: BLOOD RIGHT ARM  Result Value Ref Range Status   Specimen Description BLOOD RIGHT ARM  Final   Special Requests   Final    BOTTLES DRAWN AEROBIC AND ANAEROBIC Blood Culture adequate volume   Culture   Final    NO GROWTH 2 DAYS Performed at [facility], [address] [place], [place] [address]    Report Status PENDING  Incomplete     Labs: Basic Metabolic Panel: Recent Labs  Lab 12/11/23 2220 12/12/23 1101 12/13/23 0840 12/14/23 0453 12/15/23 0451  NA 133*  --  136 138 138  K 5.0  --  4.9 4.9 4.7  CL 108  --  112* 112* 113*  CO2 17*  --  19* 19* 18*  GLUCOSE 118*  --  114* 120* 128*  BUN 31*  --  44* 43* 35*  CREATININE 2.41* 3.32* 2.93* 2.73* 2.17*  CALCIUM 9.0  --  8.8* 9.0 9.7  PHOS  --   --  2.8  --   --    Liver Function Tests: Recent Labs  Lab 12/13/23 0840 12/14/23 0453 12/15/23 0451  AST 13* 22 30  ALT 15 20 27   ALKPHOS 130* 131* 149*  BILITOT 0.4 0.4 0.4  PROT 6.0* 5.9* 6.5  ALBUMIN  2.6* 2.5* 2.7*   No results for input(s): LIPASE, AMYLASE in the last 168 hours. No results for input(s): AMMONIA in the last 168 hours. CBC: Recent Labs  Lab  12/11/23 2220  12/12/23 1101 12/13/23 0840 12/14/23 0453 12/15/23 0451  WBC 17.6* 17.6* 12.4* 9.8 9.2  NEUTROABS 15.1*  --  9.5* 6.6 6.9  HGB 10.8* 9.9* 9.3* 9.0* 9.8*  HCT 34.6* 30.0* 27.6* 26.7* 29.6*  MCV 94.5 88.0 87.6 88.1 88.1  PLT 229 257 246 251 292   Cardiac Enzymes: No results for input(s): CKTOTAL, CKMB, CKMBINDEX, TROPONINI in the last 168 hours. BNP: BNP (last 3 results) Recent Labs    07/30/23 2131  BNP 473.9*    ProBNP (last 3 results) No results for input(s): PROBNP in the last 8760 hours.  CBG: No results for input(s): GLUCAP in the last 168 hours.  Signed:  Colen Grimes MD   [facility] 12/15/2023, 8:19 AM

## 2023-12-15 NOTE — Plan of Care (Signed)

## 2023-12-15 NOTE — Progress Notes (Signed)
 Mobility Specialist Progress Note:    12/15/23 1028  Mobility  Activity Ambulated with assistance (In hallway)  Level of Assistance Standby assist, set-up cues, supervision of patient - no hands on  Assistive Device None  Distance Ambulated (ft) 104 ft  Activity Response Tolerated well  Mobility Referral Yes  Mobility visit 1 Mobility  Mobility Specialist Start Time (ACUTE ONLY) 0900  Mobility Specialist Stop Time (ACUTE ONLY) 0909  Mobility Specialist Time Calculation (min) (ACUTE ONLY) 9 min   Received pt in bed having no complaints and agreeable to mobility. Pt was asymptomatic throughout ambulation and returned to room w/o fault. Left in bed with call bell in reach and all needs met.   Lavanda Pollack Mobility Specialist  Please contact via [employer] or  Rehab Office [phone]

## 2023-12-17 ENCOUNTER — Telehealth: Payer: Self-pay

## 2023-12-17 LAB — CULTURE, BLOOD (ROUTINE X 2)
Culture: NO GROWTH
Culture: NO GROWTH
Special Requests: ADEQUATE

## 2023-12-17 NOTE — Transitions of Care (Post Inpatient/ED Visit) (Signed)
 12/17/2023  Name: Tyler Alvarez MRN: [id number] DOB: 02-11-1976  Today's TOC FU Call Status: Today's TOC FU Call Status:: Successful TOC FU Call Completed TOC FU Call Complete Date: 12/17/23 Patient's Name and Date of Birth confirmed.  Transition Care Management Follow-up Telephone Call Date of Discharge: 12/15/23 Discharge Facility: [facility]) Type of Discharge: Inpatient Admission Primary Inpatient Discharge Diagnosis:: Cellulitis of small multiple abscesses How have you been since you were released from the hospital?: Better (Patients mother states he is doing well and not complaining of pain.) Any questions or concerns?: No  Items Reviewed: Did you receive and understand the discharge instructions provided?: Yes Medications obtained,verified, and reconciled?: Yes (Medications Reviewed) Any new allergies since your discharge?: No Dietary orders reviewed?: Yes Type of Diet Ordered:: low salt heart healthy/ renal Do you have support at home?: Yes People in Home [RPT]: parent(s) Name of Support/Comfort Primary Source: Tyler Alvarez  Medications Reviewed Today: Medications Reviewed Today     Reviewed by Tyler Cassata E, RN (Registered Nurse) on 12/17/23 at 1222  Med List Status: <None>   Medication Order Taking? Sig Documenting Provider Last Dose Status Informant  acetaminophen  (TYLENOL ) 500 MG tablet [id number] Yes Take 1,000 mg by mouth daily as needed for mild pain, headache or fever. [provider]  Active Pharmacy Records, Self           Med Note CHRISTIE ALEXANDER   Tue May 10, 2021  2:15 PM)    amLODipine  (NORVASC ) 5 MG tablet [id number] Yes Take 1 tablet (5 mg total) by mouth daily. Samtani, Jai-Gurmukh, MD  Active   amoxicillin -clavulanate (AUGMENTIN ) 500-125 MG tablet [id number] Yes Take 1 tablet by mouth every 12 (twelve) hours for 9 days. Samtani, Jai-Gurmukh, MD  Active   gabapentin  (NEURONTIN ) 300 MG capsule [id number] Yes Take 1 capsule (300 mg total)  by mouth at bedtime. Knute Thersia Bitters, FNP  Active Pharmacy Records, Self  losartan  (COZAAR ) 25 MG tablet [id number] Yes Take 0.5 tablets (12.5 mg total) by mouth daily. Samtani, Jai-Gurmukh, MD  Active   mirtazapine  (REMERON ) 7.5 MG tablet [id number]  TAKE 1 TABLET BY MOUTH AT BEDTIME.  Patient not taking: Reported on 12/17/2023   de [place], Quintin PARAS, MD  Active Self, Pharmacy Records  ondansetron  (ZOFRAN -ODT) 4 MG disintegrating tablet [id number] Yes Take 1 tablet (4 mg total) by mouth every 8 (eight) hours as needed for nausea or vomiting. Francis Ileana SAILOR, PA-C  Active Pharmacy Records, Self  predniSONE  (DELTASONE ) 5 MG tablet [id number] Yes Take 5 mg by mouth daily.  [provider]  Active Pharmacy Records, Self           Med Note JACKOLYN WADDELL DEL   Wed Aug 01, 2023  1:16 PM)    tacrolimus  (PROGRAF ) 1 MG capsule [id number] Yes Take 2 mg by mouth 2 (two) times daily.  [provider]  Active Pharmacy Records, Self            Home Care and Equipment/Supplies: Were Home Health Services Ordered?: No Any new equipment or medical supplies ordered?: No  Functional Questionnaire: Do you need assistance with bathing/showering or dressing?: No Do you need assistance with eating?: Yes (mother prepares meals) Do you have difficulty maintaining continence: No Do you need assistance with getting out of bed/getting out of a chair/moving?: No Do you have difficulty managing or taking your medications?: Yes (mother assist with managing patients medications)  Follow up appointments reviewed: PCP Follow-up appointment confirmed?: Yes Date of PCP follow-up  appointment?: 01/15/24 (attempted to have care guide make earlier appointment for hospital follow up.  No earlier appointments available.) Follow-up Provider: Thersia Stark, FNP Specialist Hospital Follow-up appointment confirmed?: Yes Date of Specialist follow-up appointment?: 01/23/24 Follow-Up Specialty Provider:: Dr. Elsa  - urology Do you need transportation to your follow-up appointment?: No (mother states patient uses a medical transport service.) Do you understand care options if your condition(s) worsen?: Yes-patient verbalized understanding  SDOH Interventions Today    Flowsheet Row Most Recent Value  SDOH Interventions   Food Insecurity Interventions Intervention Not Indicated  Housing Interventions Intervention Not Indicated  Transportation Interventions Intervention Not Indicated  Utilities Interventions Intervention Not Indicated    Goals Addressed             This Visit's Progress    VBCI Transitions of Care (TOC) Care Plan       Problems:  Recent Hospitalization for treatment of cellulitis of right buttock and right shoulder Knowledge Deficit Related to management of cellulitis of right buttock/ right shoulder  Goal:  Over the next 30 days, the patient will not experience hospital readmission  Interventions:  Transitions of Care: Labs Reviewed  CBC with differential and CMP labs with parent and confirmed patient has follow up appointment with primary care provider/ urologist.  Doctor Visits  - discussed the importance of doctor visits Reviewed Signs and symptoms of infection.  Advised to call provider office if infection like symptoms noticed Advised to seek emergency medical care for severe symptoms such as SOB/ chest pain Reviewed post hospital care instructions for right buttock/ right shoulder wounds Advised to take prescribed antibiotic to completion and not miss any doses.  Reviewed medications and discussed compliance. Confirmed patient has transportation to provider visits.  Provided parent contact phone number for RN case manager.   Patient Self Care Activities:  Attend all scheduled provider appointments Call pharmacy for medication refills 3-7 days in advance of running out of medications Call provider office for new concerns or questions  Notify RN Care Manager of [facility]  call rescheduling needs Take medications as prescribed   take prescribed antibiotic to completion and not miss any doses.  Follow provided instructions for care of right buttock/ right shoulder wounds.  Confirmed mother/ patient has saline and dressings to apply to wound areas   Plan:  The patient has been provided with contact information for the care management team and has been advised to call with any health related questions or concerns.   Dearion Huot RN, BSN, CCM [place]  [facility], Population Health Case Manager Phone: [phone]         Discussed and offered 30 day TOC program.  Patient's mother verbally agreed to 30 day TOC program.  The patient has been provided with contact information for the care management team and has been advised to call with any health -related questions or concerns.  The patient verbalized understanding with current plan of care.  The patient is directed to their insurance card regarding availability of benefits coverage.    Arvin Seip RN, BSN, CCM [employer], Population Health Case Manager Phone: [phone]

## 2023-12-17 NOTE — Patient Instructions (Signed)
 Visit Information  Thank you for taking time to visit with me today. Please don't hesitate to contact me if I can be of assistance to you before our next scheduled telephone appointment.  Our next appointment is by telephone on 12/26/23 at 2 pm  Following is a copy of your care plan:   Goals Addressed             This Visit's Progress    VBCI Transitions of Care (TOC) Care Plan       Problems:  Recent Hospitalization for treatment of cellulitis of right buttock and right shoulder Knowledge Deficit Related to management of cellulitis of right buttock/ right shoulder  Goal:  Over the next 30 days, the patient will not experience hospital readmission  Interventions:  Transitions of Care: Labs Reviewed  CBC with differential and CMP labs with parent and confirmed patient has follow up appointment with primary care provider/ urologist.  Doctor Visits  - discussed the importance of doctor visits Reviewed Signs and symptoms of infection.  Advised to call provider office if infection like symptoms noticed Advised to seek emergency medical care for severe symptoms such as SOB/ chest pain Reviewed post hospital care instructions for right buttock/ right shoulder wounds Advised to take prescribed antibiotic to completion and not miss any doses.  Reviewed medications and discussed compliance. Confirmed patient has transportation to provider visits.  Provided parent contact phone number for RN case manager.   Patient Self Care Activities:  Attend all scheduled provider appointments Call pharmacy for medication refills 3-7 days in advance of running out of medications Call provider office for new concerns or questions  Notify RN Care Manager of [facility] call rescheduling needs Take medications as prescribed   take prescribed antibiotic to completion and not miss any doses.  Follow provided instructions for care of right buttock/ right shoulder wounds.  Confirmed mother/ patient has saline and  dressings to apply to wound areas   Plan:  The patient has been provided with contact information for the care management team and has been advised to call with any health related questions or concerns.   Marvel Mcphillips RN, BSN, CCM [place]  [facility], Population Health Case Manager Phone: [phone]         The patient verbalized understanding of instructions, educational materials, and care plan provided today and DECLINED offer to receive copy of patient instructions, educational materials, and care plan.   The patient has been provided with contact information for the care management team and has been advised to call with any health related questions or concerns.   Please call the care guide team at [phone] if you need to cancel or reschedule your appointment.   Please call the Suicide and Crisis Lifeline: 988 call 1-800-273-TALK (toll free, 24 hour hotline) if you are experiencing a Mental Health or Behavioral Health Crisis or need someone to talk to.  Arvin Seip RN, BSN, CCM [employer], Population Health Case Manager Phone: [phone]

## 2023-12-18 NOTE — Unmapped (Signed)
 Oscar [name] has been contacted in regards to their refill of PROGRAF  1 mg capsule (tacrolimus ). At this time, they have declined refill due to pt was at the hospital and have more than 2 weeks on hand . Refill assessment call date has been updated per the patient's request.

## 2023-12-21 NOTE — Unmapped (Signed)
 [facility] Refill Coordination Note    Specialty Medication(s) to be Shipped:   Transplant: Prograf  1mg     Other medication(s) to be shipped: predniSONE  5 MG tablet (DELTASONE )     Oscar [name], DOB: 11/09/1975  Phone: [phone] (home)       All above HIPAA information was verified with patient.     Was a Nurse, learning disability used for this call? No    Completed refill call assessment today to schedule patient's medication shipment from the [facility]  [phone]).  All relevant notes have been reviewed.     Specialty medication(s) and dose(s) confirmed: Regimen is correct and unchanged.   Changes to medications: Seith reports no changes at this time.  Changes to insurance: No  New side effects reported not previously addressed with a pharmacist or physician: None reported  Questions for the pharmacist: No    Confirmed patient received a Conservation officer, historic buildings and a Surveyor, mining with first shipment. The patient will receive a drug information handout for each medication shipped and additional FDA Medication Guides as required.       DISEASE/MEDICATION-SPECIFIC INFORMATION        N/A    SPECIALTY MEDICATION ADHERENCE     Medication Adherence    Patient reported X missed doses in the last month: 4  Specialty Medication: PROGRAF  1 mg capsule (tacrolimus )  Patient is on additional specialty medications: No  Adherence tools used: patient uses a pill box to manage medications  Support network for adherence: family member              Were doses missed due to medication being on hold? No      PROGRAF  1 mg capsule (tacrolimus ): 7 days of medicine on hand       REFERRAL TO PHARMACIST     Referral to the pharmacist: Not needed      [facility]     Shipping address confirmed in Epic.     Cost and Payment: Patient has a $0 copay, payment information is not required.    Delivery Scheduled: Yes, Expected medication delivery date: 12/25/2023.     Medication will be delivered via UPS to the prescription address in Epic WAM.    [facility]  Specialty Technician

## 2023-12-24 MED FILL — PREDNISONE 5 MG TABLET: ORAL | 90 days supply | Qty: 90 | Fill #2

## 2023-12-24 MED FILL — PROGRAF 1 MG CAPSULE: ORAL | 30 days supply | Qty: 120 | Fill #4

## 2023-12-25 ENCOUNTER — Encounter (HOSPITAL_BASED_OUTPATIENT_CLINIC_OR_DEPARTMENT_OTHER): Payer: Self-pay | Admitting: Family Medicine

## 2023-12-25 ENCOUNTER — Ambulatory Visit (INDEPENDENT_AMBULATORY_CARE_PROVIDER_SITE_OTHER): Admitting: Family Medicine

## 2023-12-25 VITALS — BP 144/95 | HR 97 | Ht 72.0 in | Wt 113.2 lb

## 2023-12-25 DIAGNOSIS — R630 Anorexia: Secondary | ICD-10-CM

## 2023-12-25 DIAGNOSIS — Z09 Encounter for follow-up examination after completed treatment for conditions other than malignant neoplasm: Secondary | ICD-10-CM | POA: Diagnosis not present

## 2023-12-25 DIAGNOSIS — Z681 Body mass index (BMI) 19 or less, adult: Secondary | ICD-10-CM | POA: Diagnosis not present

## 2023-12-25 DIAGNOSIS — L0291 Cutaneous abscess, unspecified: Secondary | ICD-10-CM | POA: Diagnosis not present

## 2023-12-25 DIAGNOSIS — R636 Underweight: Secondary | ICD-10-CM

## 2023-12-25 DIAGNOSIS — Z940 Kidney transplant status: Secondary | ICD-10-CM | POA: Diagnosis not present

## 2023-12-25 DIAGNOSIS — Q851 Tuberous sclerosis: Secondary | ICD-10-CM | POA: Diagnosis not present

## 2023-12-25 MED ORDER — ONDANSETRON 4 MG PO TBDP
4.0000 mg | ORAL_TABLET | Freq: Three times a day (TID) | ORAL | 0 refills | Status: DC | PRN
Start: 2023-12-25 — End: 2024-04-04

## 2023-12-25 NOTE — Progress Notes (Signed)
 Subjective:   Tyler Alvarez 09/11/1975 12/25/2023  Chief Complaint  Patient presents with   Hospitalization Follow-up    Pt is here today for a hospital follow up appt. Pt states he is feeling better after recent stay. Denies any main concerns for today's visit.    HPI: Tyler Alvarez presents today for hospital discharge follow up:    Patient was admitted to [facility] from 12/11/2023 until 12/15/2023.  He was admitted for cellulitis of small multiple abscesses present to the back and buttocks.  He has a history of tuberosclerosis and status post renal transplant.  He has previously been seen by [facility] plastic surgery for management of chronic abscesses with I&D.  He was seen by Dr. Arelia in July 2025 for multiple cysts of the left neck and was offered a I&D, but declined at the time and continue to use antibiotics instead.  He presented to the hospital with fever, body aches, and had a cellulitic abscess present to the right buttock in which an I&D was performed in the ER.  He was admitted for sepsis due to cellulitis for antibiotics and possible wound consult.  He was discharged on Augmentin  for 14 days and recommended to follow-up with plastic surgery and/or wound care.  He states the abscesses are still present and he has been using warm compresses to help promote drainage.  States all areas have healed.  He is accompanied by his mother who is primary caretaker today.   Patient has appointment with nephrologist within 1 month.  Patient is followed by [facility] for ascending aortic aneurysm and has a follow-up in October regarding this surveillance.  He is needing follow-up of his CBC and renal panel as well as close follow-up with his nephrologist.  Wt Readings from Last 3 Encounters:  12/25/23 113 lb 3.2 oz (51.3 kg)  12/11/23 113 lb 12.1 oz (51.6 kg)  10/15/23 113 lb 12.8 oz (51.6 kg)     The following portions of the patient's history were reviewed and updated  as appropriate: past medical history, past surgical history, family history, social history, allergies, medications, and problem list.   Patient Active Problem List   Diagnosis Date Noted   Abscess of buttock 12/12/2023   Cellulitis of right buttock 12/12/2023   Intractable nausea and vomiting 07/30/2023   Influenza A 07/30/2023   Underweight (BMI < 18.5) 05/30/2023   Abscess of multiple sites 03/22/2021   History of seizures 03/22/2021   Right foot pain 06/16/2020   Anemia 01/20/2020   Electrolyte abnormality 01/20/2020   Gastroesophageal reflux disease    Acute-on-chronic kidney injury (HCC) 09/06/2019   Gout 10/09/2017   CKD (chronic kidney disease), stage III (HCC) 10/09/2017   Immunosuppression (HCC) 04/25/2015   Normocytic anemia    Renal transplant, status post    Essential hypertension    Aneurysm (HCC) 07/16/2014   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   Aftercare following organ transplant 05/23/2011   Attention deficit hyperactivity disorder (ADHD) 04/20/2011   Partial epilepsy with impairment of consciousness (HCC) 04/20/2011   HEARING LOSS, LEFT EAR 11/25/2008   Past Medical History:  Diagnosis Date   Abscess 03/22/2021   ADD (attention deficit disorder)    Anxiety    Ascending aortic aneurysm (HCC)    Benign skin lesion of neck    left neck cyst   Cellulitis 02/24/2021   Cellulitis and abscess of buttock    Chronic kidney disease    s/p transplant 2001  Community acquired pneumonia 01/03/2020   Family history of adverse reaction to anesthesia    Pt mother stated Father-I think that he stopped breathing, they called a code but he came through okay   GERD (gastroesophageal reflux disease)    Headache    Migraines   Heart murmur    Hypertension    Immune deficiency disorder (HCC)    Restless leg syndrome    Seizures (HCC)    treated by Dr. Lane, no seizures in years and years   Sepsis (HCC) 02/25/2021   Sepsis without acute organ dysfunction (HCC)     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HCC)    Wears glasses    Past Surgical History:  Procedure Laterality Date   AV FISTULA PLACEMENT     KIDNEY TRANSPLANT  2001   LESION EXCISION WITH COMPLEX REPAIR Left 05/24/2021   Procedure: EXCISION BENIGN LESION NECK 9CM, LAYERED CLOSURE NECK 10CM;  Surgeon: Arelia Filippo, MD;  Location: [facility] OR;  Service: Plastics;  Laterality: Left;   MASS EXCISION Left 12/11/2016   Procedure: EXCISION OF BENIGN LESION OF THE NECK WITH LAYERED CLOSURE;  Surgeon: Arelia Filippo, MD;  Location: [facility] OR;  Service: Plastics;  Laterality: Left;   MASS EXCISION     neck x2   MASS EXCISION Bilateral 04/01/2018   Procedure: excision benign lesions left neck and right and left cheek ( 4cm, 2cm, 1cm x3), layered closure neck <10cm, layered closure right cheek 1cm;  Surgeon: Arelia Filippo, MD;  Location: [facility] OR;  Service: Plastics;  Laterality: Bilateral;  left cheek lesion   MULTIPLE TOOTH EXTRACTIONS     NEPHRECTOMY     both removed in 1999, 3 months apart   REVISON OF ARTERIOVENOUS FISTULA Left 07/16/2014   Procedure: EXCISION ANEURYSMAL AREA OF LEFT ARTERIOVENOUS FISTULA;  Surgeon: Gaile LELON New, MD;  Location: [facility] OR;  Service: Vascular;  Laterality: Left;   Family History  Problem Relation Age of Onset   Hypothyroidism Mother    Cancer Mother 13       cervical   Hypertension Mother    COPD Mother    COPD Father 65       COPD   Ulcers Father    COPD Brother    Heart disease Brother    Cancer Other    Outpatient Medications Prior to Visit  Medication Sig Dispense Refill   acetaminophen  (TYLENOL ) 500 MG tablet Take 1,000 mg by mouth daily as needed for mild pain, headache or fever.     amLODipine  (NORVASC ) 5 MG tablet Take 1 tablet (5 mg total) by mouth daily.     gabapentin  (NEURONTIN ) 300 MG capsule Take 1 capsule (300 mg total) by mouth at bedtime. 90 capsule 3   losartan  (COZAAR ) 25 MG tablet Take 0.5 tablets (12.5 mg total) by mouth daily.     predniSONE   (DELTASONE ) 5 MG tablet Take 5 mg by mouth daily.      tacrolimus  (PROGRAF ) 1 MG capsule Take 2 mg by mouth 2 (two) times daily.      ondansetron  (ZOFRAN -ODT) 4 MG disintegrating tablet Take 1 tablet (4 mg total) by mouth every 8 (eight) hours as needed for nausea or vomiting. 20 tablet 0   mirtazapine  (REMERON ) 7.5 MG tablet TAKE 1 TABLET BY MOUTH AT BEDTIME. (Patient not taking: Reported on 12/25/2023) 90 tablet 0   No facility-administered medications prior to visit.   Allergies  Allergen Reactions   Erythromycin Nausea And Vomiting   Sulfa  Antibiotics Itching and Nausea And Vomiting  ROS: A complete ROS was performed with pertinent positives/negatives noted in the HPI. The remainder of the ROS are negative.    Objective:   Today's Vitals   12/25/23 1531 12/25/23 1622  BP: (!) 143/99 (!) 144/95  Pulse: 97   SpO2: 98%   Weight: 113 lb 3.2 oz (51.3 kg)   Height: 6' (1.829 m)     Physical Exam   GENERAL: Well-appearing, in NAD. Well nourished.  SKIN: Pink, warm and dry.  Palpable nontender induration with abscess and chronic subcutaneous cyst formation present to right shoulder, left thoracic spine, and right buttock. No drainage from sites.  Head: Normocephalic. NECK: Trachea midline. Full ROM w/o pain or tenderness. RESPIRATORY: Chest wall symmetrical. Respirations even and non-labored. Breath sounds clear to auscultation bilaterally.  CARDIAC: S1, S2 present, regular rate and rhythm without murmur or gallops. Peripheral pulses 2+ bilaterally.  MSK: Muscle tone and strength appropriate for age. NEUROLOGIC: No motor or sensory deficits. Steady, even gait. C2-C12 intact.  PSYCH/MENTAL STATUS: Alert, oriented x 3. Cooperative, appropriate mood and affect.           Assessment & Plan:   1. Hospital discharge follow-up (Primary) Will repeat CBC and BMP with lab work today to monitor leukocytosis and renal function.  Will plan to fax results to patient's nephrologist  Dr. Malvin.  Patient will complete Augmentin  as prescribed.  May extend pending lab work if needed.  - CBC with Differential/Platelet - Basic metabolic panel with GFR  2. Underweight (BMI < 18.5) Discussed patient's risk for skin breakdown due to low BMI and recommend increasing caloric intake.  3. Decreased appetite Patient requesting Zofran  refill.  This has been sent in. - ondansetron  (ZOFRAN -ODT) 4 MG disintegrating tablet; Take 1 tablet (4 mg total) by mouth every 8 (eight) hours as needed for nausea or vomiting.  Dispense: 20 tablet; Refill: 0  4. 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HCC) 5. Abscess of multiple sites Currently managed by multiple providers.  We discussed possible recommendations for prevention of skin breakdown including alternating pressure pad, seat cushion, padded dressings if needed and consult with wound care.  Patient was agreeable and referral placed to wound care center.  Recommend he also follow-up with plastic surgeon Dr. Arelia. SABRA  PCP attempted to order home health DME for prevention of skin breakdown, but was unsuccessful due to insurance requirements.  Recommend they obtain these items at [facility].  - AMB referral to wound care center  6. Renal transplant, status post Will obtain BMP with lab work today and fax results over with patient's release signed to his nephrologist. - Basic metabolic panel with GFR   Meds ordered this encounter  Medications   ondansetron  (ZOFRAN -ODT) 4 MG disintegrating tablet    Sig: Take 1 tablet (4 mg total) by mouth every 8 (eight) hours as needed for nausea or vomiting.    Dispense:  20 tablet    Refill:  0    Supervising Provider:   DE PERU, RAYMOND J [[id number]]   Lab Orders         CBC with Differential/Platelet         Basic metabolic panel with GFR      Return in about 3 months (around 03/26/2024) for Follow up chronic conditions .    Patient to reach out to office if new, worrisome, or unresolved symptoms  arise or if no improvement in patient's condition. Patient verbalized understanding and is agreeable to treatment plan. All questions answered to patient's satisfaction.  Thersia Schuyler Stark, [place]

## 2023-12-25 NOTE — Patient Instructions (Addendum)
 If you do not hear from wound center or home health/durable medicical equipment by the end of the week, please call my office. [phone]

## 2023-12-26 ENCOUNTER — Encounter: Admitting: Gastroenterology

## 2023-12-26 ENCOUNTER — Other Ambulatory Visit: Payer: Self-pay

## 2023-12-26 LAB — CBC WITH DIFFERENTIAL/PLATELET
Basophils Absolute: 0.1 x10E3/uL (ref 0.0–0.2)
Basos: 1 %
EOS (ABSOLUTE): 0.4 x10E3/uL (ref 0.0–0.4)
Eos: 3 %
Hematocrit: 34.1 % — ABNORMAL LOW (ref 37.5–51.0)
Hemoglobin: 11.1 g/dL — ABNORMAL LOW (ref 13.0–17.7)
Immature Grans (Abs): 0 x10E3/uL (ref 0.0–0.1)
Immature Granulocytes: 0 %
Lymphocytes Absolute: 1.3 x10E3/uL (ref 0.7–3.1)
Lymphs: 11 %
MCH: 29.2 pg (ref 26.6–33.0)
MCHC: 32.6 g/dL (ref 31.5–35.7)
MCV: 90 fL (ref 79–97)
Monocytes Absolute: 0.4 x10E3/uL (ref 0.1–0.9)
Monocytes: 4 %
Neutrophils Absolute: 9.5 x10E3/uL — ABNORMAL HIGH (ref 1.4–7.0)
Neutrophils: 81 %
Platelets: 297 x10E3/uL (ref 150–450)
RBC: 3.8 x10E6/uL — ABNORMAL LOW (ref 4.14–5.80)
RDW: 13.2 % (ref 11.6–15.4)
WBC: 11.7 x10E3/uL — ABNORMAL HIGH (ref 3.4–10.8)

## 2023-12-26 LAB — BASIC METABOLIC PANEL WITH GFR
BUN/Creatinine Ratio: 17 (ref 9–20)
BUN: 39 mg/dL — ABNORMAL HIGH (ref 6–24)
CO2: 18 mmol/L — ABNORMAL LOW (ref 20–29)
Calcium: 9.7 mg/dL (ref 8.7–10.2)
Chloride: 107 mmol/L — ABNORMAL HIGH (ref 96–106)
Creatinine, Ser: 2.33 mg/dL — ABNORMAL HIGH (ref 0.76–1.27)
Glucose: 126 mg/dL — ABNORMAL HIGH (ref 70–99)
Potassium: 5 mmol/L (ref 3.5–5.2)
Sodium: 140 mmol/L (ref 134–144)
eGFR: 33 mL/min/1.73 — ABNORMAL LOW (ref >59)

## 2023-12-26 NOTE — Patient Instructions (Signed)
 Visit Information  Thank you for taking time to visit with me today. Please don't hesitate to contact me if I can be of assistance to you before our next scheduled telephone appointment.  Our next appointment is by telephone on 01/01/24  at 1 pm  Following is a copy of your care plan:   Goals Addressed             This Visit's Progress    VBCI Transitions of Care (TOC) Care Plan       Problems:  Recent Hospitalization for treatment of cellulitis of right buttock and right shoulder Knowledge Deficit Related to management of cellulitis of right buttock/ right shoulder  Goal:  Over the next 30 days, the patient will not experience hospital readmission  Interventions:  Transitions of Care: Labs Reviewed  CBC with differential and CMP labs with parent and confirmed patient has follow up appointment with primary care provider/ urologist.  Doctor Visits  - discussed the importance of doctor visits Reviewed Signs and symptoms of infection.  Advised to call provider office if infection like symptoms noticed Advised to seek emergency medical care for severe symptoms such as SOB/ chest pain Reviewed medications and discussed compliance. Advised to call primary care office and/ or RN case manager if not contacted by wound center by 12/28/23.  Advised to notify provider for any new/ ongoing symptoms. Assessed patients appetite Discussed increase in Hgb which is trending in the right direction Discussed upcoming provider visits   Patient Self Care Activities:  Attend all scheduled provider appointments Call pharmacy for medication refills 3-7 days in advance of running out of medications Call provider office for new concerns or questions  Notify RN Care Manager of [facility] call rescheduling needs Take medications as prescribed   Continue to monitor skin areas for sign of infection Notify primary care provider office or RN case manager if you do not hear from the wound clinic by 12/28/23.     Plan:  Telephone follow up appointment with care management team member scheduled for:  01/01/24 at 1 pm  Samanthia Howland RN, BSN, CCM [place]  [facility], Population Health Case Manager Phone: [phone]         The patient verbalized understanding of instructions, educational materials, and care plan provided today and DECLINED offer to receive copy of patient instructions, educational materials, and care plan.   The patient has been provided with contact information for the care management team and has been advised to call with any health related questions or concerns.   Please call the care guide team at [phone] if you need to cancel or reschedule your appointment.   Please call the Suicide and Crisis Lifeline: 988 call 1-800-273-TALK (toll free, 24 hour hotline) if you are experiencing a Mental Health or Behavioral Health Crisis or need someone to talk to.  Arvin Seip RN, BSN, CCM [employer], Population Health Case Manager Phone: [phone]

## 2023-12-26 NOTE — Transitions of Care (Post Inpatient/ED Visit) (Signed)
 Transition of Care week 2  Visit Note  12/26/2023  Name: Tyler Alvarez MRN: [id number]          DOB: 12-May-1976  Situation: Patient enrolled in [facility] 30-day program. Visit completed with patient by telephone.   Background:   Initial Transition Care Management Follow-up Telephone Call    Past Medical History:  Diagnosis Date   Abscess 03/22/2021   ADD (attention deficit disorder)    Anxiety    Ascending aortic aneurysm (HCC)    Benign skin lesion of neck    left neck cyst   Cellulitis 02/24/2021   Cellulitis and abscess of buttock    Chronic kidney disease    s/p transplant 2001   Community acquired pneumonia 01/03/2020   Family history of adverse reaction to anesthesia    Pt mother stated Father-I think that he stopped breathing, they called a code but he came through okay   GERD (gastroesophageal reflux disease)    Headache    Migraines   Heart murmur    Hypertension    Immune deficiency disorder (HCC)    Restless leg syndrome    Seizures (HCC)    treated by Dr. Lane, no seizures in years and years   Sepsis (HCC) 02/25/2021   Sepsis without acute organ dysfunction (HCC)    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HCC)    Wears glasses     Assessment: Patient Reported Symptoms: Cognitive Cognitive Status: Struggling with memory recall, Normal speech and language skills, Alert and oriented to person, place, and time      Neurological Neurological Review of Symptoms: No symptoms reported    HEENT HEENT Symptoms Reported: No symptoms reported      Cardiovascular Cardiovascular Symptoms Reported: No symptoms reported    Respiratory Respiratory Symptoms Reported: No symptoms reported    Endocrine Endocrine Symptoms Reported: No symptoms reported Is patient diabetic?: No    Gastrointestinal Gastrointestinal Symptoms Reported: No symptoms reported Additional Gastrointestinal Details: Mother states patients appetite has gotten much better since infection has cleared up. She  states patients primary care provider had previously prescribed a medication to help with appetite however patient is not taking it.  Mother states the doctor is aware of this.      Genitourinary Genitourinary Symptoms Reported: No symptoms reported    Integumentary Integumentary Symptoms Reported: Wound Additional Integumentary Details: Mother states abscess wounds on patients buttocks and shoulder no longer oozing therefore no dressing changes are required. She denies any signs of infection. Mother states patient saw his primary care provider on yesterday and she is referring him to the wound center for further follow up.  She states patient seems to be doing much better. Reports patient has completed his antibiotic course. Skin Management Strategies: Routine screening  Musculoskeletal Musculoskelatal Symptoms Reviewed: No symptoms reported        Psychosocial Psychosocial Symptoms Reported: Not assessed Additional Psychological Details: assessment completed with patients mother/ caregiver.         There were no vitals filed for this visit.  Medications Reviewed Today     Reviewed by Moneka Mcquinn E, RN (Registered Nurse) on 12/26/23 at 1449  Med List Status: <None>   Medication Order Taking? Sig Documenting Provider Last Dose Status Informant  acetaminophen  (TYLENOL ) 500 MG tablet [id number] Yes Take 1,000 mg by mouth daily as needed for mild pain, headache or fever. [provider]  Active Pharmacy Records, Self           Med Note CHRISTIE, [place]   Tue  May 10, 2021  2:15 PM)    amLODipine  (NORVASC ) 5 MG tablet [id number] Yes Take 1 tablet (5 mg total) by mouth daily. Samtani, Jai-Gurmukh, MD  Active   amoxicillin -clavulanate (AUGMENTIN ) 500-125 MG tablet [id number]  Take 1 tablet by mouth every 12 (twelve) hours for 9 days.  Patient not taking: Reported on 12/26/2023   Samtani, Jai-Gurmukh, MD  Expired 12/24/23 2359   gabapentin  (NEURONTIN ) 300 MG capsule [id number] Yes  Take 1 capsule (300 mg total) by mouth at bedtime. Knute Thersia Bitters, FNP  Active Pharmacy Records, Self  losartan  (COZAAR ) 25 MG tablet [id number] Yes Take 0.5 tablets (12.5 mg total) by mouth daily. Samtani, Jai-Gurmukh, MD  Active    Patient not taking:   Discontinued 12/25/23 1557 ondansetron  (ZOFRAN -ODT) 4 MG disintegrating tablet [id number] Yes Take 1 tablet (4 mg total) by mouth every 8 (eight) hours as needed for nausea or vomiting. Knute Thersia Bitters, FNP  Active   predniSONE  (DELTASONE ) 5 MG tablet [id number] Yes Take 5 mg by mouth daily.  [provider]  Active Pharmacy Records, Self           Med Note JACKOLYN WADDELL DEL   Wed Aug 01, 2023  1:16 PM)    tacrolimus  (PROGRAF ) 1 MG capsule [id number] Yes Take 2 mg by mouth 2 (two) times daily.  [provider]  Active Pharmacy Records, Self            Goals Addressed             This Visit's Progress    VBCI Transitions of Care (TOC) Care Plan       Problems:  Recent Hospitalization for treatment of cellulitis of right buttock and right shoulder Knowledge Deficit Related to management of cellulitis of right buttock/ right shoulder  Goal:  Over the next 30 days, the patient will not experience hospital readmission  Interventions:  Transitions of Care: Labs Reviewed  CBC with differential and CMP labs with parent and confirmed patient has follow up appointment with primary care provider/ urologist.  Doctor Visits  - discussed the importance of doctor visits Reviewed Signs and symptoms of infection.  Advised to call provider office if infection like symptoms noticed Advised to seek emergency medical care for severe symptoms such as SOB/ chest pain Reviewed medications and discussed compliance. Advised to call primary care office and/ or RN case manager if not contacted by wound center by 12/28/23.  Advised to notify provider for any new/ ongoing symptoms. Assessed patients appetite Discussed increase in  Hgb which is trending in the right direction Discussed upcoming provider visits   Patient Self Care Activities:  Attend all scheduled provider appointments Call pharmacy for medication refills 3-7 days in advance of running out of medications Call provider office for new concerns or questions  Notify RN Care Manager of [facility] call rescheduling needs Take medications as prescribed   Continue to monitor skin areas for sign of infection Notify primary care provider office or RN case manager if you do not hear from the wound clinic by 12/28/23.    Plan:  Telephone follow up appointment with care management team member scheduled for:  01/01/24 at 1 pm  Hatcher Froning RN, BSN, CCM [place]  [facility], Population Health Case Manager Phone: [phone]          Recommendation:   Specialty provider follow-up 9/25 with nephrologist.  Continue Current Plan of Care  Follow Up Plan:   Telephone follow-up in 1 week 01/01/24  at 1 pm  Arvin Seip RN, BSN, CCM [facility], Population Health Case Manager Phone: [phone]

## 2023-12-27 ENCOUNTER — Ambulatory Visit (HOSPITAL_BASED_OUTPATIENT_CLINIC_OR_DEPARTMENT_OTHER): Payer: Self-pay | Admitting: Family Medicine

## 2023-12-27 NOTE — Progress Notes (Signed)
 Please call patient's mother and notify her that renal function is stable.  We will send the results to his nephrologist for review.  His white blood cell count has started to trend back up.  I would like to possibly extend his Augmentin  if he has already finished this.  Is he having any signs of infection such as fever, drainage from the abscess sites at all?  His hemoglobin improved.

## 2024-01-01 ENCOUNTER — Other Ambulatory Visit: Payer: Self-pay

## 2024-01-01 NOTE — Patient Instructions (Signed)
 Visit Information  Thank you for taking time to visit with me today. Please don't hesitate to contact me if I can be of assistance to you before our next scheduled telephone appointment.  Our next appointment is by telephone on 01/09/24 at 2 pm  Following is a copy of your care plan:   Goals Addressed             This Visit's Progress    VBCI Transitions of Care (TOC) Care Plan       Problems:  Recent Hospitalization for treatment of cellulitis of right buttock and right shoulder Knowledge Deficit Related to management of cellulitis of right buttock/ right shoulder, gout flare up  Goal:  Over the next 30 days, the patient will not experience hospital readmission  Interventions:  Transitions of Care: Doctor Visits  - discussed the importance of doctor visits Reviewed Signs and symptoms of infection.  Advised to call provider office if infection like symptoms noticed Advised to seek emergency medical care for severe symptoms such as SOB/ chest pain Reviewed medications and discussed compliance. Confirmed contact from wound center. - Mother states patient has new patient appointment with wound center on 02/10/24 Advised to notify provider for any new/ ongoing symptoms. Assessed patients appetite Discussed upcoming provider visits Discussed ways to manage gout flare ups:  Take prescribed medication as recommended by provider, apply ice packs to affected join 10/20 minutes at a time wrapping area in cloth before applying ice, elevate leg, drink plenty of water Assessed for ongoing healing of right buttock/ right shoulder cellulitis-   Patient Self Care Activities:  Attend all scheduled provider appointments Call pharmacy for medication refills 3-7 days in advance of running out of medications Call provider office for new concerns or questions  Notify RN Care Manager of TOC call rescheduling needs Take medications as prescribed   Continue to monitor skin areas for sign of  infection To help with gout flare up: Take prescribed medication as recommended by provider, apply ice packs to affected join 10/20 minutes at a time wrapping area in cloth before applying ice, elevate leg, drink plenty of water   Plan:  Telephone follow up appointment with care management team member scheduled for:  01/09/24 at 2 pm           Patient verbalizes understanding of instructions and care plan provided today and agrees to view in MyChart. Active MyChart status and patient understanding of how to access instructions and care plan via MyChart confirmed with patient.     The patient has been provided with contact information for the care management team and has been advised to call with any health related questions or concerns.   Please call the care guide team at [phone] if you need to cancel or reschedule your appointment.   Please call the Suicide and Crisis Lifeline: 988 call 1-800-273-TALK (toll free, 24 hour hotline) if you are experiencing a Mental Health or Behavioral Health Crisis or need someone to talk to.  Arvin Seip RN, BSN, CCM [employer], Population Health Case Manager Phone: [phone]

## 2024-01-01 NOTE — Transitions of Care (Post Inpatient/ED Visit) (Signed)
 Transition of Care week 3  Visit Note  01/01/2024  Name: Tyler Alvarez MRN: [id number]          DOB: June 05, 1975  Situation: Patient enrolled in [facility] 30-day program. Visit completed with patient's mother, Fuad Forget by telephone.   Background: Patient hospitalized from 12/11/23 to 12/15/23 for cellulitis of right buttock and right shoulder/ abscesses     Past Medical History:  Diagnosis Date   Abscess 03/22/2021   ADD (attention deficit disorder)    Anxiety    Ascending aortic aneurysm (HCC)    Benign skin lesion of neck    left neck cyst   Cellulitis 02/24/2021   Cellulitis and abscess of buttock    Chronic kidney disease    s/p transplant 2001   Community acquired pneumonia 01/03/2020   Family history of adverse reaction to anesthesia    Pt mother stated Father-I think that he stopped breathing, they called a code but he came through okay   GERD (gastroesophageal reflux disease)    Headache    Migraines   Heart murmur    Hypertension    Immune deficiency disorder (HCC)    Restless leg syndrome    Seizures (HCC)    treated by Dr. Lane, no seizures in years and years   Sepsis (HCC) 02/25/2021   Sepsis without acute organ dysfunction (HCC)    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HCC)    Wears glasses     Assessment: Patient Reported Symptoms: Cognitive Cognitive Status: Struggling with memory recall, Normal speech and language skills, Alert and oriented to person, place, and time      Neurological Neurological Review of Symptoms: No symptoms reported    HEENT HEENT Symptoms Reported: No symptoms reported      Cardiovascular Cardiovascular Symptoms Reported: No symptoms reported    Respiratory Respiratory Symptoms Reported: No symptoms reported    Endocrine Endocrine Symptoms Reported: No symptoms reported    Gastrointestinal Gastrointestinal Symptoms Reported: No symptoms reported      Genitourinary Genitourinary Symptoms Reported: No symptoms reported     Integumentary Integumentary Symptoms Reported: Wound Additional Integumentary Details: mother states wound area to patients buttocks and shoulder have healed.  She states patient has developed gout in his left knee. She states it is swollen and warm to touch.  She reports patient does complain of pain in the knee. Mother states patient is scheduled to see his primary care provider on tomorrow 01/02/24 and scheduled for repeat lab work on 01/07/24 due to WBC trending up per provider. Skin Management Strategies: Routine screening, Medication therapy  Musculoskeletal Musculoskelatal Symptoms Reviewed: No symptoms reported        Psychosocial Psychosocial Symptoms Reported: Not assessed Additional Psychological Details: Unable to assess due to telephone assessment being completed with mother/ caregiver.         There were no vitals filed for this visit.  Medications Reviewed Today     Reviewed by Rita Prom E, RN (Registered Nurse) on 01/01/24 at 1347  Med List Status: <None>   Medication Order Taking? Sig Documenting Provider Last Dose Status Informant  acetaminophen  (TYLENOL ) 500 MG tablet [id number] Yes Take 1,000 mg by mouth daily as needed for mild pain, headache or fever. [provider]  Active Pharmacy Records, Self           Med Note CHRISTIE ALEXANDER   Tue May 10, 2021  2:15 PM)    amLODipine  (NORVASC ) 5 MG tablet [id number] Yes Take 1 tablet (5 mg total) by mouth daily.  Samtani, Jai-Gurmukh, MD  Active   gabapentin  (NEURONTIN ) 300 MG capsule [id number] Yes Take 1 capsule (300 mg total) by mouth at bedtime. Knute Thersia Bitters, FNP  Active Pharmacy Records, Self  losartan  (COZAAR ) 25 MG tablet [id number] Yes Take 0.5 tablets (12.5 mg total) by mouth daily. Samtani, Jai-Gurmukh, MD  Active   ondansetron  (ZOFRAN -ODT) 4 MG disintegrating tablet [id number] Yes Take 1 tablet (4 mg total) by mouth every 8 (eight) hours as needed for nausea or vomiting. Knute Thersia Bitters, FNP   Active   predniSONE  (DELTASONE ) 5 MG tablet [id number] Yes Take 5 mg by mouth daily.  [provider]  Active Pharmacy Records, Self           Med Note JACKOLYN WADDELL DEL   Wed Aug 01, 2023  1:16 PM)    tacrolimus  (PROGRAF ) 1 MG capsule [id number] Yes Take 2 mg by mouth 2 (two) times daily.  [provider]  Active Pharmacy Records, Self            Goals Addressed             This Visit's Progress    VBCI Transitions of Care (TOC) Care Plan       Problems:  Recent Hospitalization for treatment of cellulitis of right buttock and right shoulder Knowledge Deficit Related to management of cellulitis of right buttock/ right shoulder, gout flare up  Goal:  Over the next 30 days, the patient will not experience hospital readmission  Interventions:  Transitions of Care: Doctor Visits  - discussed the importance of doctor visits Reviewed Signs and symptoms of infection.  Advised to call provider office if infection like symptoms noticed Advised to seek emergency medical care for severe symptoms such as SOB/ chest pain Reviewed medications and discussed compliance. Confirmed contact from wound center. - Mother states patient has new patient appointment with wound center on 02/10/24 Advised to notify provider for any new/ ongoing symptoms. Assessed patients appetite Discussed upcoming provider visits Discussed ways to manage gout flare ups:  Take prescribed medication as recommended by provider, apply ice packs to affected join 10/20 minutes at a time wrapping area in cloth before applying ice, elevate leg, drink plenty of water Assessed for ongoing healing of right buttock/ right shoulder cellulitis-   Patient Self Care Activities:  Attend all scheduled provider appointments Call pharmacy for medication refills 3-7 days in advance of running out of medications Call provider office for new concerns or questions  Notify RN Care Manager of TOC call rescheduling  needs Take medications as prescribed   Continue to monitor skin areas for sign of infection To help with gout flare up: Take prescribed medication as recommended by provider, apply ice packs to affected join 10/20 minutes at a time wrapping area in cloth before applying ice, elevate leg, drink plenty of water   Plan:  Telephone follow up appointment with care management team member scheduled for:  01/09/24 at 2 pm           Recommendation:   PCP Follow-up Continue Current Plan of Care  Follow Up Plan:   Telephone follow-up in 1 week 01/09/24 at 2 pm  Arvin Seip RN, BSN, CCM [place]  [facility], Population Health Case Manager Phone: [phone]

## 2024-01-02 ENCOUNTER — Other Ambulatory Visit (HOSPITAL_BASED_OUTPATIENT_CLINIC_OR_DEPARTMENT_OTHER): Payer: Self-pay | Admitting: Family Medicine

## 2024-01-02 ENCOUNTER — Ambulatory Visit (HOSPITAL_BASED_OUTPATIENT_CLINIC_OR_DEPARTMENT_OTHER): Payer: Self-pay | Admitting: Family Medicine

## 2024-01-02 ENCOUNTER — Ambulatory Visit (INDEPENDENT_AMBULATORY_CARE_PROVIDER_SITE_OTHER)

## 2024-01-02 ENCOUNTER — Encounter (HOSPITAL_BASED_OUTPATIENT_CLINIC_OR_DEPARTMENT_OTHER): Payer: Self-pay | Admitting: Family Medicine

## 2024-01-02 ENCOUNTER — Ambulatory Visit (HOSPITAL_BASED_OUTPATIENT_CLINIC_OR_DEPARTMENT_OTHER): Admitting: Family Medicine

## 2024-01-02 VITALS — BP 153/83 | HR 89 | Temp 98.5°F | Ht 72.0 in | Wt 113.0 lb

## 2024-01-02 DIAGNOSIS — M25461 Effusion, right knee: Secondary | ICD-10-CM

## 2024-01-02 DIAGNOSIS — M109 Gout, unspecified: Secondary | ICD-10-CM | POA: Diagnosis not present

## 2024-01-02 DIAGNOSIS — L539 Erythematous condition, unspecified: Secondary | ICD-10-CM | POA: Diagnosis not present

## 2024-01-02 DIAGNOSIS — M25561 Pain in right knee: Secondary | ICD-10-CM

## 2024-01-02 MED ORDER — PREDNISONE 10 MG PO TABS: ORAL_TABLET | ORAL | 0 refills | Status: AC

## 2024-01-02 NOTE — Progress Notes (Signed)
 Acute Care Office Visit  Subjective:   Tyler Alvarez [birth year] 01/02/2024  Chief Complaint  Patient presents with   Follow-up    Patient states he's here to follow up with his knee issues. Pt states he has pain in his right knee and knee cap.    HPI: KNEE PAIN:  Onset: Started Early Saturday morning with sudden onset.  Patient reports he hit the affected right knee on a cabinet 1 day prior.  He reports history of gout in both of his feet previously treated successfully with prednisone .  Unable to take NSAIDs in large amounts due to status post renal transplant and CKD.  Reports aching pain, fever, and redness and swelling to the right knee.  Using a cane to ambulate today.  His mom did provide leftover prednisone  20 mg tablets starting on Saturday for concern of a gout flare and reports that swelling and erythema did improve with this.  He denies fevers or chills, denies clicking, popping, or weakness to the right knee.   The following portions of the patient's history were reviewed and updated as appropriate: past medical history, past surgical history, family history, social history, allergies, medications, and problem list.   Patient Active Problem List   Diagnosis Date Noted   Abscess of buttock 12/12/2023   Cellulitis of right buttock 12/12/2023   Intractable nausea and vomiting 07/30/2023   Influenza A 07/30/2023   Underweight (BMI < 18.5) 05/30/2023   Abscess of multiple sites 03/22/2021   History of seizures 03/22/2021   Right foot pain 06/16/2020   Anemia 01/20/2020   Electrolyte abnormality 01/20/2020   Gastroesophageal reflux disease    Acute-on-chronic kidney injury (HCC) 09/06/2019   Gout 10/09/2017   CKD (chronic kidney disease), stage III (HCC) 10/09/2017   Immunosuppression (HCC) 04/25/2015   Normocytic anemia    Renal transplant, status post    Essential hypertension    Aneurysm (HCC) 07/16/2014   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   Aftercare  following organ transplant 05/23/2011   Attention deficit hyperactivity disorder (ADHD) 04/20/2011   Partial epilepsy with impairment of consciousness (HCC) 04/20/2011   HEARING LOSS, LEFT EAR 11/25/2008   Past Medical History:  Diagnosis Date   Abscess 03/22/2021   ADD (attention deficit disorder)    Anxiety    Ascending aortic aneurysm (HCC)    Benign skin lesion of neck    left neck cyst   Cellulitis 02/24/2021   Cellulitis and abscess of buttock    Chronic kidney disease    s/p transplant 2001   Community acquired pneumonia 01/03/2020   Family history of adverse reaction to anesthesia    Pt mother stated Father-I think that he stopped breathing, they called a code but he came through okay   GERD (gastroesophageal reflux disease)    Headache    Migraines   Heart murmur    Hypertension    Immune deficiency disorder (HCC)    Restless leg syndrome    Seizures (HCC)    treated by Dr. Lane, no seizures in years and years   Sepsis (HCC) 02/25/2021   Sepsis without acute organ dysfunction (HCC)    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HCC)    Wears glasses    Past Surgical History:  Procedure Laterality Date   AV FISTULA PLACEMENT     KIDNEY TRANSPLANT  2001   LESION EXCISION WITH COMPLEX REPAIR Left 05/24/2021   Procedure: EXCISION BENIGN LESION NECK 9CM, LAYERED CLOSURE NECK 10CM;  Surgeon: Arelia Filippo,  MD;  Location: [facility] OR;  Service: Plastics;  Laterality: Left;   MASS EXCISION Left 12/11/2016   Procedure: EXCISION OF BENIGN LESION OF THE NECK WITH LAYERED CLOSURE;  Surgeon: Arelia Filippo, MD;  Location: [facility] OR;  Service: Plastics;  Laterality: Left;   MASS EXCISION     neck x2   MASS EXCISION Bilateral 04/01/2018   Procedure: excision benign lesions left neck and right and left cheek ( 4cm, 2cm, 1cm x3), layered closure neck <10cm, layered closure right cheek 1cm;  Surgeon: Arelia Filippo, MD;  Location: [facility] OR;  Service: Plastics;  Laterality: Bilateral;  left cheek lesion    MULTIPLE TOOTH EXTRACTIONS     NEPHRECTOMY     both removed in 1999, 3 months apart   REVISON OF ARTERIOVENOUS FISTULA Left 07/16/2014   Procedure: EXCISION ANEURYSMAL AREA OF LEFT ARTERIOVENOUS FISTULA;  Surgeon: Gaile LELON New, MD;  Location: [facility] OR;  Service: Vascular;  Laterality: Left;   Family History  Problem Relation Age of Onset   Hypothyroidism Mother    Cancer Mother 59       cervical   Hypertension Mother    COPD Mother    COPD Father 59       COPD   Ulcers Father    COPD Brother    Heart disease Brother    Cancer Other    Outpatient Medications Prior to Visit  Medication Sig Dispense Refill   acetaminophen  (TYLENOL ) 500 MG tablet Take 1,000 mg by mouth daily as needed for mild pain, headache or fever.     amLODipine  (NORVASC ) 5 MG tablet Take 1 tablet (5 mg total) by mouth daily.     gabapentin  (NEURONTIN ) 300 MG capsule Take 1 capsule (300 mg total) by mouth at bedtime. 90 capsule 3   losartan  (COZAAR ) 25 MG tablet Take 0.5 tablets (12.5 mg total) by mouth daily.     ondansetron  (ZOFRAN -ODT) 4 MG disintegrating tablet Take 1 tablet (4 mg total) by mouth every 8 (eight) hours as needed for nausea or vomiting. 20 tablet 0   predniSONE  (DELTASONE ) 5 MG tablet Take 5 mg by mouth daily.      tacrolimus  (PROGRAF ) 1 MG capsule Take 2 mg by mouth 2 (two) times daily.      No facility-administered medications prior to visit.   Allergies  Allergen Reactions   Erythromycin Nausea And Vomiting   Sulfa  Antibiotics Itching and Nausea And Vomiting     ROS: A complete ROS was performed with pertinent positives/negatives noted in the HPI. The remainder of the ROS are negative.    Objective:   Today's Vitals   01/02/24 1038  BP: (!) 153/83  Pulse: 89  Temp: 98.5 F (36.9 C)  SpO2: 98%  Weight: 113 lb (51.3 kg)  Height: 6' (1.829 m)    GENERAL: Well-appearing, in NAD. Well nourished.  SKIN: Pink, warm and dry.  Head: Normocephalic. NECK: Trachea midline. Full ROM w/o  pain or tenderness.  RESPIRATORY: Chest wall symmetrical. Respirations even and non-labored. Breath sounds clear to auscultation bilaterally.  CARDIAC: S1, S2 present, regular rate and rhythm without murmur or gallops. Peripheral pulses 2+ bilaterally.  MSK: Muscle tone and strength appropriate for age.  Erythema present to right knee.  Full range of motion present.  Tenderness with palpation to right knee EXTREMITIES: Without clubbing, cyanosis, or edema.  NEUROLOGIC: No motor or sensory deficits. Steady, even gait. C2-C12 intact.  PSYCH/MENTAL STATUS: Alert, oriented x 3. Cooperative, appropriate mood and affect.  No results found for any visits on 01/02/24.    Assessment & Plan:  1. Pain and swelling of right knee (Primary) Will obtain x-ray.  Concern for possible gout versus acute orthopedic sprain or osteomyelitis.  Will send in prednisone  pending result for likely gout flare. - DG Knee 1-2 Views Right; Future  Return if symptoms worsen or fail to improve.    Patient to reach out to office if new, worrisome, or unresolved symptoms arise or if no improvement in patient's condition. Patient verbalized understanding and is agreeable to treatment plan. All questions answered to patient's satisfaction.    Thersia Schuyler Stark, [place]

## 2024-01-02 NOTE — Progress Notes (Signed)
 Called patient and spoke with his mother present on [place].  Reviewed x-ray results and prednisone  taper sent in for patient for acute gout attack.

## 2024-01-07 ENCOUNTER — Other Ambulatory Visit (HOSPITAL_BASED_OUTPATIENT_CLINIC_OR_DEPARTMENT_OTHER)

## 2024-01-09 ENCOUNTER — Other Ambulatory Visit: Payer: Self-pay

## 2024-01-09 NOTE — Patient Instructions (Signed)
 Visit Information  Thank you for taking time to visit with me today. Please don't hesitate to contact me if I can be of assistance to you before our next scheduled telephone appointment.  Our next appointment is by telephone on 01/18/24 at 2 pm  Following is a copy of your care plan:   Goals Addressed             This Visit's Progress    VBCI Transitions of Care (TOC) Care Plan       Problems:  Recent Hospitalization for treatment of cellulitis of right buttock and right shoulder Knowledge Deficit Related to management of cellulitis of right buttock/ right shoulder, gout flare up  Goal:  Over the next 30 days, the patient will not experience hospital readmission  Interventions:  Transitions of Care: Doctor Visits  - discussed the importance of doctor visits Reviewed Signs and symptoms of infection.  Advised to call provider office if infection like symptoms noticed Advised to seek emergency medical care for severe symptoms such as SOB/ chest pain Reviewed medications and discussed compliance. Confirmed contact from wound center. - Mother states patient has new patient appointment with wound center on 02/05/24 Advised to notify provider for any new/ ongoing symptoms. Assessed patients appetite Discussed food items that can cause gout. Explained purines in diet. Advised to avoid foods high in purines, stay hydrated Discussed upcoming provider visits Discussed ways to manage gout flare ups:  Take prescribed medication as recommended by provider, apply ice packs to affected join 10/20 minutes at a time wrapping area in cloth before applying ice, elevate leg, drink plenty of water   Patient Self Care Activities:  Attend all scheduled provider appointments Call pharmacy for medication refills 3-7 days in advance of running out of medications Call provider office for new concerns or questions  Notify RN Care Manager of TOC call rescheduling needs Take medications as prescribed    Continue to monitor skin areas for sign of infection To help with gout flare up: Take prescribed medication as recommended by provider, apply ice packs to affected join 10/20 minutes at a time wrapping area in cloth before applying ice, elevate leg, drink plenty of water  Try to avoid foods high in purines, shellfish, beef/ pork, processed foods/ sweets.   Plan:  Telephone follow up appointment with care management team member scheduled for:  01/18/24 at 2 pm           Patient verbalizes understanding of instructions and care plan provided today and agrees to view in MyChart. Active MyChart status and patient understanding of how to access instructions and care plan via MyChart confirmed with patient.     The patient has been provided with contact information for the care management team and has been advised to call with any health related questions or concerns.   Please call the care guide team at [phone] if you need to cancel or reschedule your appointment.   Please call the Suicide and Crisis Lifeline: 988 call the [place]  National Suicide Prevention Lifeline: 2262207055 or TTY: (601)812-0174 TTY [phone]) to talk to a trained counselor call 1-800-273-TALK (toll free, 24 hour hotline) go to [facility] [address] [place] [phone]) if you are experiencing a Mental Health or Behavioral Health Crisis or need someone to talk to.  Arvin Seip RN, BSN, CCM [employer], Population Health Case Manager Phone: [phone]

## 2024-01-09 NOTE — Transitions of Care (Post Inpatient/ED Visit) (Signed)
 Transition of Care week 4  Visit Note  01/09/2024  Name: Tyler Alvarez MRN: [id number]          DOB: 1975-12-02  Situation: Patient enrolled in [facility] 30-day program. Visit completed with patient by telephone.   Background: Patient hospitalized from 12/11/23 to 12/15/23 for cellulitis of right buttock and right shoulder/ abscesses     Past Medical History:  Diagnosis Date   Abscess 03/22/2021   ADD (attention deficit disorder)    Anxiety    Ascending aortic aneurysm (HCC)    Benign skin lesion of neck    left neck cyst   Cellulitis 02/24/2021   Cellulitis and abscess of buttock    Chronic kidney disease    s/p transplant 2001   Community acquired pneumonia 01/03/2020   Family history of adverse reaction to anesthesia    Pt mother stated Father-I think that he stopped breathing, they called a code but he came through okay   GERD (gastroesophageal reflux disease)    Headache    Migraines   Heart murmur    Hypertension    Immune deficiency disorder (HCC)    Restless leg syndrome    Seizures (HCC)    treated by Dr. Lane, no seizures in years and years   Sepsis (HCC) 02/25/2021   Sepsis without acute organ dysfunction (HCC)    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HCC)    Wears glasses     Assessment: Patient Reported Symptoms: Cognitive Cognitive Status: Struggling with memory recall, Normal speech and language skills, Alert and oriented to person, place, and time      Neurological Neurological Review of Symptoms: No symptoms reported    HEENT HEENT Symptoms Reported: No symptoms reported      Cardiovascular Cardiovascular Symptoms Reported: No symptoms reported    Respiratory Respiratory Symptoms Reported: No symptoms reported    Endocrine Endocrine Symptoms Reported: No symptoms reported    Gastrointestinal Gastrointestinal Symptoms Reported: No symptoms reported      Genitourinary Genitourinary Symptoms Reported: No symptoms reported    Integumentary Integumentary  Symptoms Reported: Other Other Integumentary Symptoms: mother states a small boil is starting to  come up on patients lower left back. Advised to use warm compress and apply. Advised to notify provider if worsens. Mother states patient is scheduled to follow up with his primary care provider on 9/2 and follows up at the wound clinic on 02/05/24. Mother states patiet hit his right knee over the weekend. She states he was complaining of swelling, increased inflammation, and pain in his knee. Mother states patient saw his primary provider on 01/02/24 and was prescribed a prednisone  taper which he has completed and had a xray.  She states the doctor felt he may have had gout. Skin Management Strategies: Routine screening  Musculoskeletal Musculoskelatal Symptoms Reviewed: No symptoms reported        Psychosocial Psychosocial Symptoms Reported: Not assessed Additional Psychological Details: unable to assess due to assessment being completed with patients mother/ caregiver.         There were no vitals filed for this visit.  Medications Reviewed Today     Reviewed by Ritik Stavola E, RN (Registered Nurse) on 01/09/24 at 1423  Med List Status: <None>   Medication Order Taking? Sig Documenting Provider Last Dose Status Informant  acetaminophen  (TYLENOL ) 500 MG tablet [id number] Yes Take 1,000 mg by mouth daily as needed for mild pain, headache or fever. [provider]  Active Pharmacy Records, Self  Med Note CHRISTIE ALEXANDER   Tue May 10, 2021  2:15 PM)    amLODipine  (NORVASC ) 5 MG tablet [id number] Yes Take 1 tablet (5 mg total) by mouth daily. Samtani, Jai-Gurmukh, MD  Active   gabapentin  (NEURONTIN ) 300 MG capsule [id number] Yes Take 1 capsule (300 mg total) by mouth at bedtime. Knute Thersia Bitters, FNP  Active Pharmacy Records, Self  losartan  (COZAAR ) 25 MG tablet [id number] Yes Take 0.5 tablets (12.5 mg total) by mouth daily. Samtani, Jai-Gurmukh, MD  Active   ondansetron   (ZOFRAN -ODT) 4 MG disintegrating tablet [id number] Yes Take 1 tablet (4 mg total) by mouth every 8 (eight) hours as needed for nausea or vomiting. Knute Thersia Bitters, FNP  Active   predniSONE  (DELTASONE ) 5 MG tablet [id number] Yes Take 5 mg by mouth daily.  [provider]  Active Pharmacy Records, Self           Med Note JACKOLYN WADDELL DEL   Wed Aug 01, 2023  1:16 PM)    tacrolimus  (PROGRAF ) 1 MG capsule [id number] Yes Take 2 mg by mouth 2 (two) times daily.  [provider]  Active Pharmacy Records, Self            Recommendation:   PCP Follow-up Continue Current Plan of Care  Follow Up Plan:   Telephone follow-up in 1 week 01/18/24 at 2 pm  Arvin Seip RN, BSN, CCM [place]  [facility], Population Health Case Manager Phone: [phone]

## 2024-01-15 ENCOUNTER — Ambulatory Visit (HOSPITAL_BASED_OUTPATIENT_CLINIC_OR_DEPARTMENT_OTHER): Admitting: Family Medicine

## 2024-01-15 ENCOUNTER — Encounter (HOSPITAL_BASED_OUTPATIENT_CLINIC_OR_DEPARTMENT_OTHER): Payer: Self-pay | Admitting: Family Medicine

## 2024-01-15 VITALS — BP 161/89 | HR 89 | Ht 72.0 in | Wt 121.0 lb

## 2024-01-15 DIAGNOSIS — Z681 Body mass index (BMI) 19 or less, adult: Secondary | ICD-10-CM | POA: Diagnosis not present

## 2024-01-15 DIAGNOSIS — L0291 Cutaneous abscess, unspecified: Secondary | ICD-10-CM | POA: Diagnosis not present

## 2024-01-15 DIAGNOSIS — Z23 Encounter for immunization: Secondary | ICD-10-CM

## 2024-01-15 DIAGNOSIS — D72828 Other elevated white blood cell count: Secondary | ICD-10-CM

## 2024-01-15 DIAGNOSIS — R636 Underweight: Secondary | ICD-10-CM | POA: Diagnosis not present

## 2024-01-15 LAB — CBC WITH DIFFERENTIAL/PLATELET
Basophils Absolute: 0.1 x10E3/uL (ref 0.0–0.2)
Basos: 0 %
EOS (ABSOLUTE): 0.3 x10E3/uL (ref 0.0–0.4)
Eos: 2 %
Hematocrit: 32.1 % — ABNORMAL LOW (ref 37.5–51.0)
Hemoglobin: 10.3 g/dL — ABNORMAL LOW (ref 13.0–17.7)
Immature Grans (Abs): 0.1 x10E3/uL (ref 0.0–0.1)
Immature Granulocytes: 1 %
Lymphocytes Absolute: 1 x10E3/uL (ref 0.7–3.1)
Lymphs: 7 %
MCH: 29.5 pg (ref 26.6–33.0)
MCHC: 32.1 g/dL (ref 31.5–35.7)
MCV: 92 fL (ref 79–97)
Monocytes Absolute: 0.8 x10E3/uL (ref 0.1–0.9)
Monocytes: 6 %
Neutrophils Absolute: 11.6 x10E3/uL — ABNORMAL HIGH (ref 1.4–7.0)
Neutrophils: 84 %
Platelets: 210 x10E3/uL (ref 150–450)
RBC: 3.49 x10E6/uL — ABNORMAL LOW (ref 4.14–5.80)
RDW: 13.4 % (ref 11.6–15.4)
WBC: 13.9 x10E3/uL — ABNORMAL HIGH (ref 3.4–10.8)

## 2024-01-15 MED ORDER — DOXYCYCLINE HYCLATE 100 MG PO TABS
100.0000 mg | ORAL_TABLET | Freq: Two times a day (BID) | ORAL | 0 refills | Status: DC
Start: 2024-01-15 — End: 2024-02-22

## 2024-01-15 MED ORDER — AMOXICILLIN 875 MG PO TABS: 875.0000 mg | ORAL_TABLET | Freq: Two times a day (BID) | ORAL | 0 refills | Status: DC

## 2024-01-15 NOTE — Unmapped (Signed)
 [facility] [name] has been contacted in regards to their refill of PROGRAF  1 mg capsule (tacrolimus ). At this time, they have declined refill due to patient having more than 2 weeks on hand . Refill assessment call date has been updated per the patient's request.

## 2024-01-15 NOTE — Progress Notes (Unsigned)
 Subjective:   Tyler Alvarez [birth year] 01/15/2024  Chief Complaint  Patient presents with  . Medical Management of Chronic Issues    61-month follow up for weight;  pt is beginning to gain weight back. Main concern is a place on pt's back that he wants to have looked at which was first noticed about 3 weeks ago but continues to grow in size.      HPI: Tyler Alvarez presents today for re-assessment and management of chronic medical conditions.  UNDERWEIGHT:  Patient has had improvement in weight in the past 2 weeks with increased oral intake and increased appetite. Reports energy has improved as well post hospitalization.   Wt Readings from Last 3 Encounters:  01/15/24 121 lb (54.9 kg)  01/02/24 113 lb (51.3 kg)  12/25/23 113 lb 3.2 oz (51.3 kg)    Right Back Abscess:  Patient reports abscess present to the right lower back present of the past 3 weeks. Reports area is very enlarged, red and looks ready to open. Denies fever, chills. Patient has recurrent abscesses to generalized back that have previously required frequent I&D and most recently hospitalization due to cellulitis.     The following portions of the patient's history were reviewed and updated as appropriate: past medical history, past surgical history, family history, social history, allergies, medications, and problem list.   Patient Active Problem List   Diagnosis Date Noted  . Abscess of buttock 12/12/2023  . Cellulitis of right buttock 12/12/2023  . Intractable nausea and vomiting 07/30/2023  . Influenza A 07/30/2023  . Underweight (BMI < 18.5) 05/30/2023  . Abscess of multiple sites 03/22/2021  . History of seizures 03/22/2021  . Right foot pain 06/16/2020  . Anemia 01/20/2020  . Electrolyte abnormality 01/20/2020  . Gastroesophageal reflux disease   . Acute-on-chronic kidney injury (HCC) 09/06/2019  . Gout 10/09/2017  . CKD (chronic kidney disease), stage III (HCC) 10/09/2017  .  Immunosuppression (HCC) 04/25/2015  . Normocytic anemia   . Renal transplant, status post   . Essential hypertension   . Aneurysm (HCC) 07/16/2014  . 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  . Aftercare following organ transplant 05/23/2011  . Attention deficit hyperactivity disorder (ADHD) 04/20/2011  . Partial epilepsy with impairment of consciousness (HCC) 04/20/2011  . HEARING LOSS, LEFT EAR 11/25/2008   Past Medical History:  Diagnosis Date  . Abscess 03/22/2021  . ADD (attention deficit disorder)   . Anxiety   . Ascending aortic aneurysm (HCC)   . Benign skin lesion of neck    left neck cyst  . Cellulitis 02/24/2021  . Cellulitis and abscess of buttock   . Chronic kidney disease    s/p transplant 2001  . Community acquired pneumonia 01/03/2020  . Family history of adverse reaction to anesthesia    Pt mother stated Father-I think that he stopped breathing, they called a code but he came through okay  . GERD (gastroesophageal reflux disease)   . Headache    Migraines  . Heart murmur   . Hypertension   . Immune deficiency disorder (HCC)   . Restless leg syndrome   . Seizures (HCC)    treated by Dr. Lane, no seizures in years and years  . Sepsis (HCC) 02/25/2021  . Sepsis without acute organ dysfunction (HCC)   . 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HCC)   . Wears glasses    Past Surgical History:  Procedure Laterality Date  . AV FISTULA PLACEMENT    . KIDNEY TRANSPLANT  2001  .  LESION EXCISION WITH COMPLEX REPAIR Left 05/24/2021   Procedure: EXCISION BENIGN LESION NECK 9CM, LAYERED CLOSURE NECK 10CM;  Surgeon: Arelia Filippo, MD;  Location: [facility] OR;  Service: Plastics;  Laterality: Left;  SABRA MASS EXCISION Left 12/11/2016   Procedure: EXCISION OF BENIGN LESION OF THE NECK WITH LAYERED CLOSURE;  Surgeon: Arelia Filippo, MD;  Location: [facility] OR;  Service: Plastics;  Laterality: Left;  SABRA MASS EXCISION     neck x2  . MASS EXCISION Bilateral 04/01/2018   Procedure: excision benign  lesions left neck and right and left cheek ( 4cm, 2cm, 1cm x3), layered closure neck <10cm, layered closure right cheek 1cm;  Surgeon: Arelia Filippo, MD;  Location: [facility] OR;  Service: Plastics;  Laterality: Bilateral;  left cheek lesion  . MULTIPLE TOOTH EXTRACTIONS    . NEPHRECTOMY     both removed in 1999, 3 months apart  . REVISON OF ARTERIOVENOUS FISTULA Left 07/16/2014   Procedure: EXCISION ANEURYSMAL AREA OF LEFT ARTERIOVENOUS FISTULA;  Surgeon: Gaile LELON New, MD;  Location: [facility] OR;  Service: Vascular;  Laterality: Left;   Family History  Problem Relation Age of Onset  . Hypothyroidism Mother   . Cancer Mother 60       cervical  . Hypertension Mother   . COPD Mother   . COPD Father 1       COPD  . Ulcers Father   . COPD Brother   . Heart disease Brother   . Cancer Other    Outpatient Medications Prior to Visit  Medication Sig Dispense Refill  . acetaminophen  (TYLENOL ) 500 MG tablet Take 1,000 mg by mouth daily as needed for mild pain, headache or fever.    . amLODipine  (NORVASC ) 5 MG tablet Take 1 tablet (5 mg total) by mouth daily.    . gabapentin  (NEURONTIN ) 300 MG capsule Take 1 capsule (300 mg total) by mouth at bedtime. 90 capsule 3  . losartan  (COZAAR ) 25 MG tablet Take 0.5 tablets (12.5 mg total) by mouth daily.    . ondansetron  (ZOFRAN -ODT) 4 MG disintegrating tablet Take 1 tablet (4 mg total) by mouth every 8 (eight) hours as needed for nausea or vomiting. 20 tablet 0  . predniSONE  (DELTASONE ) 5 MG tablet Take 5 mg by mouth daily.     . tacrolimus  (PROGRAF ) 1 MG capsule Take 2 mg by mouth 2 (two) times daily.      No facility-administered medications prior to visit.   Allergies  Allergen Reactions  . Erythromycin Nausea And Vomiting  . Sulfa  Antibiotics Itching and Nausea And Vomiting     ROS: A complete ROS was performed with pertinent positives/negatives noted in the HPI. The remainder of the ROS are negative.    Objective:   Today's Vitals   01/15/24  1449 01/15/24 1545  BP: (!) 151/85 (!) 161/89  Pulse: 89   SpO2: 99%   Weight: 121 lb (54.9 kg)   Height: 6' (1.829 m)     Physical Exam   GENERAL: Well-appearing, in NAD. Well nourished.  SKIN: Pink, warm and dry. No rash, lesion, ulceration, or ecchymoses.  Head: Normocephalic. NECK: Trachea midline. Full ROM w/o pain or tenderness. No lymphadenopathy.  EARS: Tympanic membranes are intact, translucent without bulging and without drainage. Appropriate landmarks visualized.  EYES: Conjunctiva clear without exudates. EOMI, PERRL, no drainage present.  NOSE: Septum midline w/o deformity. Nares patent, mucosa pink and non-inflamed w/o drainage. No sinus tenderness.  THROAT: Uvula midline. Oropharynx clear. Tonsils non-inflamed without exudate. Mucous membranes  pink and moist.  RESPIRATORY: Chest wall symmetrical. Respirations even and non-labored. Breath sounds clear to auscultation bilaterally.  CARDIAC: S1, S2 present, regular rate and rhythm without murmur or gallops. Peripheral pulses 2+ bilaterally.  MSK: Muscle tone and strength appropriate for age. Joints w/o tenderness, redness, or swelling.  EXTREMITIES: Without clubbing, cyanosis, or edema.  NEUROLOGIC: No motor or sensory deficits. Steady, even gait. C2-C12 intact.  PSYCH/MENTAL STATUS: Alert, oriented x 3. Cooperative, appropriate mood and affect.   I & D  Date/Time: 01/15/2024 8:51 PM  Performed by: Knute Thersia Bitters, FNP Authorized by: Knute Thersia Bitters, FNP   Consent:    Consent obtained:  Written   Consent given by:  Patient   Risks, benefits, and alternatives were discussed: yes     Risks discussed:  Bleeding, incomplete drainage, pain and infection   Alternatives discussed:  Observation, alternative treatment and referral Location:    Type:  Abscess   Location:  Trunk   Trunk location:  Back Pre-procedure details:    Skin preparation:  Povidone-iodine Anesthesia:    Anesthesia method:  Local  infiltration   Local anesthetic:  Lidocaine  1% w/o epi Procedure type:    Complexity:  Simple Procedure details:    Needle aspiration: no     Incision types:  Single straight   Incision depth:  Dermal   Scalpel blade:  10   Wound management:  Probed and deloculated and irrigated with saline   Drainage:  Purulent   Drainage amount:  Copious   Wound treatment:  Wound left open   Packing materials:  None Post-procedure details:    Procedure completion:  Tolerated well, no immediate complications    Health Maintenance Due  Topic Date Due  . Hepatitis B Vaccines 19-59 Average Risk (1 of 3 - 19+ 3-dose series) Never done  . Colonoscopy  Never done  . Medicare Annual Wellness (AWV)  12/15/2023  . COVID-19 Vaccine (6 - 2025-26 season) 01/14/2024    Results for orders placed or performed in visit on 01/15/24  CBC with Differential  Result Value Ref Range   WBC 13.9 (H) 3.4 - 10.8 x10E3/uL   RBC 3.49 (L) 4.14 - 5.80 x10E6/uL   Hemoglobin 10.3 (L) 13.0 - 17.7 g/dL   Hematocrit 67.8 (L) 62.4 - 51.0 %   MCV 92 79 - 97 fL   MCH 29.5 26.6 - 33.0 pg   MCHC 32.1 31.5 - 35.7 g/dL   RDW 86.5 88.3 - 84.5 %   Platelets 210 150 - 450 x10E3/uL   Neutrophils 84 Not Estab. %   Lymphs 7 Not Estab. %   Monocytes 6 Not Estab. %   Eos 2 Not Estab. %   Basos 0 Not Estab. %   Neutrophils Absolute 11.6 (H) 1.4 - 7.0 x10E3/uL   Lymphocytes Absolute 1.0 0.7 - 3.1 x10E3/uL   Monocytes Absolute 0.8 0.1 - 0.9 x10E3/uL   EOS (ABSOLUTE) 0.3 0.0 - 0.4 x10E3/uL   Basophils Absolute 0.1 0.0 - 0.2 x10E3/uL   Immature Granulocytes 1 Not Estab. %   Immature Grans (Abs) 0.1 0.0 - 0.1 x10E3/uL    The 10-year ASCVD risk score (Arnett DK, et al., 2019) is: 3.2%     Assessment & Plan:  *** Encounter for immunization -     Flu vaccine trivalent PF, 6mos and older(Flulaval,Afluria,Fluarix,Fluzone)  Other elevated white blood cell (WBC) count -     CBC with Differential/Platelet  Other orders -      Doxycycline  Hyclate; Take  1 tablet (100 mg total) by mouth 2 (two) times daily.  Dispense: 14 tablet; Refill: 0 -     Amoxicillin ; Take 1 tablet (875 mg total) by mouth 2 (two) times daily for 7 days.  Dispense: 14 tablet; Refill: 0    Meds ordered this encounter  Medications  . doxycycline  (VIBRA -TABS) 100 MG tablet    Sig: Take 1 tablet (100 mg total) by mouth 2 (two) times daily.    Dispense:  14 tablet    Refill:  0    Supervising Provider:   DE PERU, RAYMOND J [id number]  . amoxicillin  (AMOXIL ) 875 MG tablet    Sig: Take 1 tablet (875 mg total) by mouth 2 (two) times daily for 7 days.    Dispense:  14 tablet    Refill:  0    Supervising Provider:   DE PERU, RAYMOND J [id number]   Lab Orders         CBC with Differential     No images are attached to the encounter or orders placed in the encounter.  Return in about 2 days (around 01/17/2024) for Wound Check .    Patient to reach out to office if new, worrisome, or unresolved symptoms arise or if no improvement in patient's condition. Patient verbalized understanding and is agreeable to treatment plan. All questions answered to patient's satisfaction.    Thersia Schuyler Stark, [place]

## 2024-01-17 ENCOUNTER — Ambulatory Visit (HOSPITAL_BASED_OUTPATIENT_CLINIC_OR_DEPARTMENT_OTHER): Payer: Self-pay | Admitting: Family Medicine

## 2024-01-17 ENCOUNTER — Ambulatory Visit (HOSPITAL_BASED_OUTPATIENT_CLINIC_OR_DEPARTMENT_OTHER): Admitting: Family Medicine

## 2024-01-17 ENCOUNTER — Encounter (HOSPITAL_BASED_OUTPATIENT_CLINIC_OR_DEPARTMENT_OTHER): Payer: Self-pay | Admitting: Family Medicine

## 2024-01-17 VITALS — BP 138/82 | HR 89 | Ht 72.0 in | Wt 121.0 lb

## 2024-01-17 DIAGNOSIS — L0291 Cutaneous abscess, unspecified: Secondary | ICD-10-CM

## 2024-01-17 NOTE — Progress Notes (Unsigned)
 Subjective:   Tyler Alvarez 14-Nov-1975 01/17/2024  Chief Complaint  Patient presents with   Wound Check    Patient is here for a wound check. Mother states that the area on pt's back is looking good. Pt denies any pain.    HPI: Tyler Alvarez presents today for re-assessment of I&D of abscess to left lower back. Patient is taking antibiotic as directed. Denies further drainage or bleeding from abscess since time of I&D on 01/15/2024.    The following portions of the patient's history were reviewed and updated as appropriate: past medical history, past surgical history, family history, social history, allergies, medications, and problem list.   Patient Active Problem List   Diagnosis Date Noted   Abscess of buttock 12/12/2023   Cellulitis of right buttock 12/12/2023   Intractable nausea and vomiting 07/30/2023   Influenza A 07/30/2023   Underweight (BMI < 18.5) 05/30/2023   Abscess of multiple sites 03/22/2021   History of seizures 03/22/2021   Right foot pain 06/16/2020   Anemia 01/20/2020   Electrolyte abnormality 01/20/2020   Gastroesophageal reflux disease    Acute-on-chronic kidney injury (HCC) 09/06/2019   Gout 10/09/2017   CKD (chronic kidney disease), stage III (HCC) 10/09/2017   Immunosuppression (HCC) 04/25/2015   Normocytic anemia    Renal transplant, status post    Essential hypertension    Aneurysm (HCC) 07/16/2014   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   Aftercare following organ transplant 05/23/2011   Attention deficit hyperactivity disorder (ADHD) 04/20/2011   Partial epilepsy with impairment of consciousness (HCC) 04/20/2011   HEARING LOSS, LEFT EAR 11/25/2008   Past Medical History:  Diagnosis Date   Abscess 03/22/2021   ADD (attention deficit disorder)    Anxiety    Ascending aortic aneurysm (HCC)    Benign skin lesion of neck    left neck cyst   Cellulitis 02/24/2021   Cellulitis and abscess of buttock    Chronic kidney disease    s/p  transplant 2001   Community acquired pneumonia 01/03/2020   Family history of adverse reaction to anesthesia    Pt mother stated Father-I think that he stopped breathing, they called a code but he came through okay   GERD (gastroesophageal reflux disease)    Headache    Migraines   Heart murmur    Hypertension    Immune deficiency disorder (HCC)    Restless leg syndrome    Seizures (HCC)    treated by Dr. Lane, no seizures in years and years   Sepsis (HCC) 02/25/2021   Sepsis without acute organ dysfunction (HCC)    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HCC)    Wears glasses    Past Surgical History:  Procedure Laterality Date   AV FISTULA PLACEMENT     KIDNEY TRANSPLANT  2001   LESION EXCISION WITH COMPLEX REPAIR Left 05/24/2021   Procedure: EXCISION BENIGN LESION NECK 9CM, LAYERED CLOSURE NECK 10CM;  Surgeon: Arelia Filippo, MD;  Location: [facility] OR;  Service: Plastics;  Laterality: Left;   MASS EXCISION Left 12/11/2016   Procedure: EXCISION OF BENIGN LESION OF THE NECK WITH LAYERED CLOSURE;  Surgeon: Arelia Filippo, MD;  Location: [facility] OR;  Service: Plastics;  Laterality: Left;   MASS EXCISION     neck x2   MASS EXCISION Bilateral 04/01/2018   Procedure: excision benign lesions left neck and right and left cheek ( 4cm, 2cm, 1cm x3), layered closure neck <10cm, layered closure right cheek 1cm;  Surgeon: Arelia Filippo, MD;  Location: [facility] OR;  Service: Plastics;  Laterality: Bilateral;  left cheek lesion   MULTIPLE TOOTH EXTRACTIONS     NEPHRECTOMY     both removed in 1999, 3 months apart   REVISON OF ARTERIOVENOUS FISTULA Left 07/16/2014   Procedure: EXCISION ANEURYSMAL AREA OF LEFT ARTERIOVENOUS FISTULA;  Surgeon: Gaile LELON New, MD;  Location: [facility] OR;  Service: Vascular;  Laterality: Left;   Family History  Problem Relation Age of Onset   Hypothyroidism Mother    Cancer Mother 35       cervical   Hypertension Mother    COPD Mother    COPD Father 29       COPD   Ulcers Father    COPD  Brother    Heart disease Brother    Cancer Other    Outpatient Medications Prior to Visit  Medication Sig Dispense Refill   acetaminophen  (TYLENOL ) 500 MG tablet Take 1,000 mg by mouth daily as needed for mild pain, headache or fever.     amLODipine  (NORVASC ) 5 MG tablet Take 1 tablet (5 mg total) by mouth daily.     amoxicillin  (AMOXIL ) 875 MG tablet Take 1 tablet (875 mg total) by mouth 2 (two) times daily for 7 days. 14 tablet 0   doxycycline  (VIBRA -TABS) 100 MG tablet Take 1 tablet (100 mg total) by mouth 2 (two) times daily. 14 tablet 0   gabapentin  (NEURONTIN ) 300 MG capsule Take 1 capsule (300 mg total) by mouth at bedtime. 90 capsule 3   losartan  (COZAAR ) 25 MG tablet Take 0.5 tablets (12.5 mg total) by mouth daily.     ondansetron  (ZOFRAN -ODT) 4 MG disintegrating tablet Take 1 tablet (4 mg total) by mouth every 8 (eight) hours as needed for nausea or vomiting. 20 tablet 0   predniSONE  (DELTASONE ) 5 MG tablet Take 5 mg by mouth daily.      tacrolimus  (PROGRAF ) 1 MG capsule Take 2 mg by mouth 2 (two) times daily.      No facility-administered medications prior to visit.   Allergies  Allergen Reactions   Erythromycin Nausea And Vomiting   Sulfa  Antibiotics Itching and Nausea And Vomiting     ROS: A complete ROS was performed with pertinent positives/negatives noted in the HPI. The remainder of the ROS are negative.    Objective:   Today's Vitals   01/17/24 1458 01/17/24 1517  BP: (!) 148/82 138/82  Pulse: 89   SpO2: 100%   Weight: 121 lb (54.9 kg)   Height: 6' (1.829 m)     Physical Exam   GENERAL: Well-appearing, in NAD. Well nourished.  SKIN: Pink, warm and dry. Incision site healing well to left back with mild clear pink discharge from site without erythema, tenderness or swelling.  Head: Normocephalic. NECK: Trachea midline. Full ROM w/o pain or tenderness.  RESPIRATORY: Chest wall symmetrical. Respirations even and non-labored.  MSK: Muscle tone and strength  appropriate for age.  NEUROLOGIC: No motor or sensory deficits. Steady, even gait. C2-C12 intact.  PSYCH/MENTAL STATUS: Alert, oriented x 3. Cooperative, appropriate mood and affect.       Assessment & Plan:  1. Abscess (Primary) Improved and continuing to heal. Will complete abx regimen and return to PCP if abscess returns.   Return in about 3 months (around 04/17/2024) for Follow up Chronic Conditions .    Patient to reach out to office if new, worrisome, or unresolved symptoms arise or if no improvement in patient's condition. Patient verbalized understanding and is agreeable  to treatment plan. All questions answered to patient's satisfaction.    Thersia Schuyler Stark, [place]

## 2024-01-17 NOTE — Progress Notes (Signed)
 White blood cell count is stable and consistent with chronic elevation given glucocorticoid use.  No signs of systemic infection or elevation at this time.  Hemoglobin and hematocrit stable.  Will continue to monitor in conjunction with transplant team and nephrology.

## 2024-01-18 ENCOUNTER — Other Ambulatory Visit: Payer: Self-pay

## 2024-01-18 DIAGNOSIS — L0291 Cutaneous abscess, unspecified: Secondary | ICD-10-CM

## 2024-01-18 NOTE — Transitions of Care (Post Inpatient/ED Visit) (Signed)
 Transition of Care week #5  Visit Note  01/18/2024  Name: Tyler Alvarez MRN: [id number]          DOB: Oct 17, 1975  Situation: Patient enrolled in [facility] 30-day program. Visit completed with mother by telephone.   Background: Patient hospitalized from 12/11/23 to 12/15/23 for cellulitis of right buttock and right shoulder/ abscesses      Past Medical History:  Diagnosis Date   Abscess 03/22/2021   ADD (attention deficit disorder)    Anxiety    Ascending aortic aneurysm (HCC)    Benign skin lesion of neck    left neck cyst   Cellulitis 02/24/2021   Cellulitis and abscess of buttock    Chronic kidney disease    s/p transplant 2001   Community acquired pneumonia 01/03/2020   Family history of adverse reaction to anesthesia    Pt mother stated Father-I think that he stopped breathing, they called a code but he came through okay   GERD (gastroesophageal reflux disease)    Headache    Migraines   Heart murmur    Hypertension    Immune deficiency disorder (HCC)    Restless leg syndrome    Seizures (HCC)    treated by Dr. Lane, no seizures in years and years   Sepsis (HCC) 02/25/2021   Sepsis without acute organ dysfunction (HCC)    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HCC)    Wears glasses     Assessment: Patient Reported Symptoms: Cognitive Cognitive Status: Struggling with memory recall, Alert and oriented to person, place, and time, Normal speech and language skills      Neurological Neurological Review of Symptoms: No symptoms reported    HEENT HEENT Symptoms Reported: No symptoms reported      Cardiovascular Cardiovascular Symptoms Reported: No symptoms reported    Respiratory Respiratory Symptoms Reported: No symptoms reported    Endocrine Endocrine Symptoms Reported: No symptoms reported    Gastrointestinal Gastrointestinal Symptoms Reported: No symptoms reported Additional Gastrointestinal Details: mother states patient's appetite continues to improve. Gastrointestinal  Management Strategies: Medication therapy    Genitourinary Genitourinary Symptoms Reported: No symptoms reported    Integumentary Integumentary Symptoms Reported: Other Other Integumentary Symptoms: Mother states abscess to patients back that has been present x 3 weeks continued to get worse. She states patient saw his primary care provider on 01/15/24 and had an I & D done.  She states patient was prescribed amoxicillin  and doxycycline .  Mother states she is a little concerned that patient is on 2 antibiotics. Advised to monitor patient for any GI concerns/ upset and report symptoms to provider.  Also advised that she discuss the 2 prescribed antibiotics due to her concerns. Reviewed signs of infection and advised that if infection symptoms are noted report to provider.    Musculoskeletal Musculoskelatal Symptoms Reviewed: No symptoms reported        Psychosocial Psychosocial Symptoms Reported: Not assessed Additional Psychological Details: unable to assess. Assessment completed with mother.         There were no vitals filed for this visit.  Medications Reviewed Today     Reviewed by Nyrah Demos E, RN (Registered Nurse) on 01/18/24 at 1341  Med List Status: <None>   Medication Order Taking? Sig Documenting Provider Last Dose Status Informant  acetaminophen  (TYLENOL ) 500 MG tablet [id number] Yes Take 1,000 mg by mouth daily as needed for mild pain, headache or fever. [provider]  Active Pharmacy Records, Self           Med Note (  BARI ALEXANDER   Tue May 10, 2021  2:15 PM)    amLODipine  (NORVASC ) 5 MG tablet [id number] Yes Take 1 tablet (5 mg total) by mouth daily. Samtani, Jai-Gurmukh, MD  Active   amoxicillin  (AMOXIL ) 875 MG tablet [id number] Yes Take 1 tablet (875 mg total) by mouth 2 (two) times daily for 7 days. Knute Thersia Bitters, FNP  Active   doxycycline  (VIBRA -TABS) 100 MG tablet [id number] Yes Take 1 tablet (100 mg total) by mouth 2 (two) times daily. Knute Thersia Bitters, FNP  Active   gabapentin  (NEURONTIN ) 300 MG capsule [id number] Yes Take 1 capsule (300 mg total) by mouth at bedtime. Knute Thersia Bitters, FNP  Active Pharmacy Records, Self  losartan  (COZAAR ) 25 MG tablet [id number] Yes Take 0.5 tablets (12.5 mg total) by mouth daily. Samtani, Jai-Gurmukh, MD  Active   ondansetron  (ZOFRAN -ODT) 4 MG disintegrating tablet [id number] Yes Take 1 tablet (4 mg total) by mouth every 8 (eight) hours as needed for nausea or vomiting. Knute Thersia Bitters, FNP  Active   predniSONE  (DELTASONE ) 5 MG tablet [id number] Yes Take 5 mg by mouth daily.  [provider]  Active Pharmacy Records, Self           Med Note JACKOLYN WADDELL DEL   Wed Aug 01, 2023  1:16 PM)    tacrolimus  (PROGRAF ) 1 MG capsule [id number] Yes Take 2 mg by mouth 2 (two) times daily.  [provider]  Active Pharmacy Records, Self            Goals Addressed             This Visit's Progress    COMPLETED: VBCI Transitions of Care (TOC) Care Plan       Goals met.  Patient to be referred to CCM for ongoing follow up by case manager at mother's request. Problems:  Recent Hospitalization for treatment of cellulitis of right buttock and right shoulder Knowledge Deficit Related to management of cellulitis of right buttock/ right shoulder, gout flare up - Goals met  Goal:  Over the next 30 days, the patient will not experience hospital readmission  Interventions:  Transitions of Care: Reviewed Signs and symptoms of infection.  Advised to call provider office if infection like symptoms noticed Advised to notify provider for any GI symptoms due to being on antibiotics Advised to seek emergency medical care for severe symptoms such as SOB/ chest pain Reviewed medications and discussed compliance. Advised to notify provider for any new/ ongoing symptoms. Discussed upcoming provider visits   Patient Self Care Activities:  Attend all scheduled provider appointments Call  pharmacy for medication refills 3-7 days in advance of running out of medications Call provider office for new concerns or questions  Take medications as prescribed   Continue to monitor wound for sign of infection.  If symptoms observed notify provider asap.   Plan:  No further follow up required: by [facility] team.   Referred patient to CCM for ongoing case management follow up           Recommendation:   Continue Current Plan of Care Refer to CCM for case management follow up   Follow Up Plan:   Closing From:  Transitions of Care Program  Arvin Seip RN, BSN, CCM [place]  [facility], Population Health Case Manager Phone: [phone]

## 2024-01-18 NOTE — Patient Instructions (Signed)
 Visit Information  Thank you for taking time to visit with me today. Please don't hesitate to contact me if I can be of assistance to you.  Following is a copy of your care plan:   Goals Addressed             This Visit's Progress    COMPLETED: VBCI Transitions of Care (TOC) Care Plan       Goals met.  Patient to be referred to CCM for ongoing follow up by case manager at mother's request. Problems:  Recent Hospitalization for treatment of cellulitis of right buttock and right shoulder Knowledge Deficit Related to management of cellulitis of right buttock/ right shoulder, gout flare up - Goals met  Goal:  Over the next 30 days, the patient will not experience hospital readmission  Interventions:  Transitions of Care: Reviewed Signs and symptoms of infection.  Advised to call provider office if infection like symptoms noticed Advised to notify provider for any GI symptoms due to being on antibiotics Advised to seek emergency medical care for severe symptoms such as SOB/ chest pain Reviewed medications and discussed compliance. Advised to notify provider for any new/ ongoing symptoms. Discussed upcoming provider visits   Patient Self Care Activities:  Attend all scheduled provider appointments Call pharmacy for medication refills 3-7 days in advance of running out of medications Call provider office for new concerns or questions  Take medications as prescribed   Continue to monitor wound for sign of infection.  If symptoms observed notify provider asap.   Plan:  No further follow up required: by [facility] team.   Referred patient to CCM for ongoing case management follow up           Patient verbalizes understanding of instructions and care plan provided today and agrees to view in MyChart. Active MyChart status and patient understanding of how to access instructions and care plan via MyChart confirmed with patient.     The patient has been provided with contact information  for the care management team and has been advised to call with any health related questions or concerns.   Please call the care guide team at [phone] if you need to cancel or reschedule your appointment.   Please call the Suicide and Crisis Lifeline: 988 call the [place]  National Suicide Prevention Lifeline: 640-255-5973 or TTY: 702-711-6075 TTY 380-748-0532) to talk to a trained counselor call 1-800-273-TALK (toll free, 24 hour hotline) go to [facility] [address] [place] [phone]) if you are experiencing a Mental Health or Behavioral Health Crisis or need someone to talk to.  Arvin Seip RN, BSN, CCM [employer], Population Health Case Manager Phone: [phone]

## 2024-01-21 ENCOUNTER — Ambulatory Visit: Admitting: Diagnostic Neuroimaging

## 2024-01-28 NOTE — Unmapped (Signed)
 This pharmacist was notified by a technician that this patient has reported that they've missed 2-3 doses of their Prograf .. I have reviewed the patient's medical record and have determined that no further pharmacist action is need. Information has been escalated to clinic staff for follow-up.       Approximate time spent: 0-5 minutes    Harlene Gal, PharmD, Clinical Specialty Pharmacist  [facility]

## 2024-01-28 NOTE — Unmapped (Signed)
 [facility] Special[facility] Delivery Pharm[facility] Refill Coordination Note    Specialty Medication(s) to be Shipped:   Transplant: Prograf  1mg     Other medication(s) to be shipped: No additional medications requested for fill at this time    Specialty Medications not needed at this time: N/A     Oscar [name], DOB: July 11, 1975  Phone: [phone] (home)       All above HIPAA information was verified with patient.     Was a Nurse, learning disability used for this call? No    Completed refill call assessment today to schedule patient's medication shipment from the [facility]  [phone]).  All relevant notes have been reviewed.     Specialty medication(s) and dose(s) confirmed: Regimen is correct and unchanged.   Changes to medications: Sena reports no changes at this time.  Changes to insurance: No  New side effects reported not previously addressed with a pharmacist or physician: None reported  Questions for the pharmacist: No    Confirmed patient received a Conservation officer, historic buildings and a Surveyor, mining with first shipment. The patient will receive a drug information handout for each medication shipped and additional FDA Medication Guides as required.       DISEASE/MEDICATION-SPECIFIC INFORMATION        N/A    SPECIALTY MEDICATION ADHERENCE     Medication Adherence    Patient reported X missed doses in the last month: 0  Specialty Medication: PROGRAF  1 mg capsule (tacrolimus )  Patient is on additional specialty medications: No  Adherence tools used: patient uses a pill box to manage medications  Support network for adherence: family member              Were doses missed due to medication being on hold? No      PROGRAF  1 mg capsule (tacrolimus ): 12-14 days of medicine on hand       REFERRAL TO PHARMACIST     Referral to the pharmacist: Yes - routine compliance concerns. Patient has missed 1-3 doses of medication. Refills were scheduled and concern routed to pharmacist for evaluation.      SHIPPING     Shipping address confirmed in Epic.     Cost and Payment: Patient has a $0 copay, payment information is not required.    Delivery Scheduled: Yes, Expected medication delivery date: 02/05/2024.     Medication will be delivered via UPS to the prescription address in Epic WAM.    [facility]  Specialty Technician

## 2024-01-31 DIAGNOSIS — Z940 Kidney transplant status: Principal | ICD-10-CM

## 2024-02-01 ENCOUNTER — Telehealth: Payer: Self-pay

## 2024-02-01 NOTE — Progress Notes (Signed)
 Complex Care Management Note Care Guide Note  02/01/2024 Name: Tyler Alvarez MRN: [id number] DOB: 07-Aug-1975   Complex Care Management Outreach Attempts: An unsuccessful telephone outreach was attempted today to offer the patient information about available complex care management services.  Follow Up Plan:  Additional outreach attempts will be made to offer the patient complex care management information and services.   Encounter Outcome:  No Answer  Dreama [facility]  [facility], [facility] VBCI Assistant Direct Dial: [phone]  Fax: [phone]

## 2024-02-04 MED FILL — PROGRAF 1 MG CAPSULE: ORAL | 30 days supply | Qty: 120 | Fill #5

## 2024-02-05 ENCOUNTER — Encounter (HOSPITAL_BASED_OUTPATIENT_CLINIC_OR_DEPARTMENT_OTHER): Admitting: General Surgery

## 2024-02-05 NOTE — Progress Notes (Signed)
 Complex Care Management Note  Care Guide Note 02/05/2024 Name: [name] MRN: [id number] DOB: 26-May-1975  Tyler Alvarez is a 48 y.o. year old male who sees Caudle, Thersia Bitters, FNP for primary care. I reached out to Tyler Alvarez by phone today to offer complex care management services.  Mr. Stofko was given information about Complex Care Management services today including:   The Complex Care Management services include support from the care team which includes your Nurse Care Manager, Clinical Social Worker, or Pharmacist.  The Complex Care Management team is here to help remove barriers to the health concerns and goals most important to you. Complex Care Management services are voluntary, and the patient may decline or stop services at any time by request to their care team member.   Complex Care Management Consent Status: Patient agreed to services and verbal consent obtained.   Follow up plan:  Telephone appointment with complex care management team member scheduled for:  02/08/24 at 2:00 p.m.   Encounter Outcome:  Patient Scheduled  Dreama [facility]  [facility], [facility] VBCI Assistant Direct Dial: [phone]  Fax: [phone]

## 2024-02-08 ENCOUNTER — Other Ambulatory Visit: Payer: Self-pay | Admitting: *Deleted

## 2024-02-08 DIAGNOSIS — I10 Essential (primary) hypertension: Secondary | ICD-10-CM

## 2024-02-08 DIAGNOSIS — N183 Chronic kidney disease, stage 3 unspecified: Secondary | ICD-10-CM

## 2024-02-08 DIAGNOSIS — Z87898 Personal history of other specified conditions: Secondary | ICD-10-CM

## 2024-02-08 DIAGNOSIS — L03317 Cellulitis of buttock: Secondary | ICD-10-CM

## 2024-02-08 DIAGNOSIS — F909 Attention-deficit hyperactivity disorder, unspecified type: Secondary | ICD-10-CM

## 2024-02-08 DIAGNOSIS — Z940 Kidney transplant status: Secondary | ICD-10-CM

## 2024-02-08 NOTE — Patient Outreach (Addendum)
 Complex Care Management   Visit Note  02/08/2024  Name:  Tyler Alvarez MRN: [id number] DOB: Jul 30, 1975  Situation: Referral received for Complex Care Management related to ongoing case management at mother's request + longitudinal care management services after 12/11/23-12/15/23 admission for cellulitis of small abscess on back/shoulder area/ Options for transportation resources I obtained verbal consent from Parent.  Visit completed with Parent, Tyler Alvarez, mother per patient request  on the phone  His mother reports he does not prefer to speak with others on the phone related to time and too many questions  His wounds are healed Has been seen by pcp  01/17/24 His mother changed the dressings. Did not need wound care center services  Care gaps Had colonoscopy scheduled but had to cancel for admission + will try to reschedule He has not had and does not prefer the shingles, hepatitis nor covid vaccines but recently obtained the flu and pneumonia vaccines. Does not go out much but is reported to be susceptible to infections   After assessment today his mother inquired about information for other transportation resources than [facility] that may offer an option of calling 1-3 days prior to the scheduled ride He presently has to provide a 5 day notice prior to [facility] rides  His mother agrees to have care guide outreach with possible resources She voiced understanding of united healthcare transportation benefits reviewed by RN CM for possible emergency and less than 5 day notice. She reports he plays games online but does not like to complete further interactions and would like to find out more transportation benefits    Agrees and prefers further RN CM outreaches around 2 pm on days except Thursdays or days of appointments on a monthly basis. She voiced understanding of an outreach from another future RN CM other than this RN CM   Background:   Past Medical History:  Diagnosis Date   Abscess 03/22/2021    ADD (attention deficit disorder)    Anxiety    Ascending aortic aneurysm    Benign skin lesion of neck    left neck cyst   Cellulitis 02/24/2021   Cellulitis and abscess of buttock    Chronic kidney disease    s/p transplant 2001   Community acquired pneumonia 01/03/2020   Family history of adverse reaction to anesthesia    Pt mother stated Father-I think that he stopped breathing, they called a code but he came through okay   GERD (gastroesophageal reflux disease)    Headache    Migraines   Heart murmur    Hypertension    Immune deficiency disorder    Restless leg syndrome    Seizures (HCC)    treated by Dr. Lane, no seizures in years and years   Sepsis (HCC) 02/25/2021   Sepsis without acute organ dysfunction (HCC)    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HCC)    Wears glasses     Assessment: Patient Reported Symptoms:  Cognitive Cognitive Status: Alert and oriented to person, place, and time, Struggling with memory recall, Normal speech and language skills Cognitive/Intellectual Conditions Management [RPT]: Other Other: ADHD   Health Maintenance Behaviors: Annual physical exam, Hobbies, Sleep adequate Healing Pattern: Average Health Facilitated by: Pain control, Rest  Neurological Neurological Review of Symptoms: No symptoms reported Neurological Management Strategies: Routine screening Neurological Self-Management Outcome: 4 (good)  HEENT HEENT Symptoms Reported: No symptoms reported HEENT Management Strategies: Routine screening HEENT Self-Management Outcome: 4 (good)    Cardiovascular Cardiovascular Symptoms Reported: Lightheadness Does patient have uncontrolled Hypertension?:  (  hypotension episodes) Cardiovascular Management Strategies: Medical device, Medication therapy Cardiovascular Self-Management Outcome: 3 (uncertain)  Respiratory Respiratory Symptoms Reported: No symptoms reported Respiratory Management Strategies: Routine screening Respiratory Self-Management  Outcome: 4 (good)  Endocrine Endocrine Symptoms Reported: No symptoms reported Is patient diabetic?: No Endocrine Self-Management Outcome: 4 (good)  Gastrointestinal Gastrointestinal Symptoms Reported: Change in appetite, Reflux/heartburn Additional Gastrointestinal Details: appetite changed after prednison had gained 10 pounds but lost 7 of those pounds This appetite change was present prior to the prednisone  use He takes in boost drinks Gastrointestinal Management Strategies: Adequate rest, Nutrition support Enteral Nutrition Schedule: boost drinks Gastrointestinal Self-Management Outcome: 3 (uncertain)    Genitourinary Genitourinary Symptoms Reported: No symptoms reported Additional Genitourinary Details: had kidney transplant Genitourinary Management Strategies: Adequate rest, Medical device Genitourinary Self-Management Outcome: 4 (good)  Integumentary Integumentary Symptoms Reported: No symptoms reported Other Integumentary Symptoms: mother reports the abscess on his back and buttocks have resolved SHe continues to help monitor the sites States the patient did not need to go to a wound clinic nor have home health Skin Management Strategies: Activity, Adequate rest, Dressing changes, Routine screening Skin Self-Management Outcome: 4 (good) Skin Comment: was referred to wound care but if wound not open maybe insurance cost HH did not arrive mom was dressing it  Musculoskeletal Musculoskelatal Symptoms Reviewed: No symptoms reported, Other Additional Musculoskeletal Details: had flare up of gout resolved with prednisone  + Musculoskeletal Management Strategies: Activity, Adequate rest, Medication therapy, Routine screening, Medical device Musculoskeletal Self-Management Outcome: 4 (good) Musculoskeletal Comment: uses a borrowed wheelchair of his father's Falls in the past year?: No Number of falls in past year: 1 or less Was there an injury with Fall?: No Fall Risk Category Calculator:  0 Patient Fall Risk Level: Low Fall Risk Patient at Risk for Falls Due to: No Fall Risks Fall risk Follow up: Falls evaluation completed  Psychosocial Psychosocial Symptoms Reported: Alteration in eating habits, Alteration in sleep habits, Difficulty concentrating, Other Additional Psychological Details: stays up most night to play games, does not go out much or have many visitors Behavioral Management Strategies: Activity, Adequate rest, Support system, Coping strategies Behavioral Health Self-Management Outcome: 3 (uncertain) Major Change/Loss/Stressor/Fears (CP): Medical condition, self, Resources Techniques to [employer] with Loss/Stress/Change: Diversional activities, Play, Withdraw Quality of Family Relationships: helpful, involved, supportive Do you feel physically threatened by others?: No    02/08/2024    PHQ2-9 Depression Screening   Little interest or pleasure in doing things Not at all  Feeling down, depressed, or hopeless Not at all  PHQ-2 - Total Score 0  Trouble falling or staying asleep, or sleeping too much    Feeling tired or having little energy    Poor appetite or overeating     Feeling bad about yourself - or that you are a failure or have let yourself or your family down    Trouble concentrating on things, such as reading the newspaper or watching television    Moving or speaking so slowly that other people could have noticed.  Or the opposite - being so fidgety or restless that you have been moving around a lot more than usual    Thoughts that you would be better off dead, or hurting yourself in some way    PHQ2-9 Total Score    If you checked off any problems, how difficult have these problems made it for you to do your work, take care of things at home, or get along with other people    Depression Interventions/Treatment  There were no vitals filed for this visit.  Medications Reviewed Today     Reviewed by Ramonita Suzen CROME, RN (Registered Nurse) on 02/08/24  at 1739  Med List Status: <None>   Medication Order Taking? Sig Documenting Provider Last Dose Status Informant  acetaminophen  (TYLENOL ) 500 MG tablet [id number] Yes Take 1,000 mg by mouth daily as needed for mild pain, headache or fever. [provider]  Active Pharmacy Records, Self           Med Note CHRISTIE ALEXANDER   Tue May 10, 2021  2:15 PM)    amLODipine  (NORVASC ) 5 MG tablet [id number] Yes Take 1 tablet (5 mg total) by mouth daily. Samtani, Jai-Gurmukh, MD  Active   doxycycline  (VIBRA -TABS) 100 MG tablet [id number]  Take 1 tablet (100 mg total) by mouth 2 (two) times daily.  Patient not taking: Reported on 02/08/2024   Caudle, Alexis Olivia, FNP  Active   gabapentin  (NEURONTIN ) 300 MG capsule [id number] Yes Take 1 capsule (300 mg total) by mouth at bedtime. Knute Thersia Bitters, FNP  Active Pharmacy Records, Self  losartan  (COZAAR ) 25 MG tablet [id number] Yes Take 0.5 tablets (12.5 mg total) by mouth daily. Samtani, Jai-Gurmukh, MD  Active   ondansetron  (ZOFRAN -ODT) 4 MG disintegrating tablet [id number] Yes Take 1 tablet (4 mg total) by mouth every 8 (eight) hours as needed for nausea or vomiting. Knute Thersia Bitters, FNP  Active   predniSONE  (DELTASONE ) 5 MG tablet [id number]  Take 5 mg by mouth daily.  [provider]  Active Pharmacy Records, Self           Med Note JACKOLYN WADDELL DEL   Wed Aug 01, 2023  1:16 PM)    tacrolimus  (PROGRAF ) 1 MG capsule [id number] Yes Take 2 mg by mouth 2 (two) times daily.  [provider]  Active Pharmacy Records, Self            Recommendation:   04/17/24 PCP Follow-up Specialty provider follow-up cardiology on 02/22/24 Continue Current Plan of Care Consider other united healthcare dual complete transportation resources Close monitor of skin disturbances- prone to infections  Follow Up Plan:   Telephone follow up appointment date/time:  03/05/24 1:30 pm with Rosaline Finlay, RN CM   Jaziah Kwasnik L. Ramonita, RN, BSN,  CCM [place]  [facility], [facility] RN Care Manager Direct Dial: [phone]  Fax: [phone]

## 2024-02-08 NOTE — Patient Instructions (Signed)
 Visit Information  Thank you for taking time to visit with me today. Please don't hesitate to contact me if I can be of assistance to you before our next scheduled appointment.  Our next appointment is by telephone on 03/05/24 at 1:30 pm Please call the care guide team at [phone] if you need to cancel or reschedule your appointment.   Following is a copy of your care plan:   Goals Addressed             This Visit's Progress    other transporatation resources than local [facility] RN Care Plan   No change    Problems:  Care Coordination needs related to Transportation  Goal: Over the next 2 months the Caregiver Patient will work with community resource care guide to address needs related to Transportation as evidenced by patient and/or community resource care guide support     Interventions:   Evaluation of current treatment plan related to [facility] and other options for [employer]  Discussed plans with patient for ongoing care management follow up and provided patient with direct contact information for care management team Care Guide referral for information for other transportation resources than [facility] that may offer an option of calling 1-3 days prior to the scheduled ride  Patient Self-Care Activities:  Attend all scheduled provider appointments Work with care guide on finding information for other transportation resources than [facility] that may offer an option of calling 1-3 days prior to the scheduled ride  Plan:  Telephone follow up appointment with care management team member scheduled for:  03/05/24 2 pm              Please call the Suicide and Crisis Lifeline: 988 call the [place]  National Suicide Prevention Lifeline: 270-375-3180 or TTY: 780-288-7672 TTY 734-184-9886) to talk to a trained counselor call 1-800-273-TALK (toll free, 24 hour hotline) call the [facility]: 2538466497 call 911 if you are  experiencing a Mental Health or Behavioral Health Crisis or need someone to talk to.  Patient verbalizes understanding of instructions and care plan provided today and agrees to view in MyChart. Active MyChart status and patient understanding of how to access instructions and care plan via MyChart confirmed with patient.     Pierce Biagini L. Ramonita, RN, BSN, CCM [place]  [facility], [facility] RN Care Manager Direct Dial: [phone]  Fax: [phone]

## 2024-02-12 ENCOUNTER — Telehealth: Payer: Self-pay

## 2024-02-12 NOTE — Progress Notes (Signed)
 Complex Care Management Note Care Guide Note  02/12/2024 Name: Tyler Alvarez MRN: [id number] DOB: 1976/01/28   Complex Care Management Outreach Attempts: An unsuccessful telephone outreach was attempted today to offer the patient information about available complex care management services.  Follow Up Plan:  Additional outreach attempts will be made to offer the patient complex care management information and services.   Encounter Outcome:  No Answer  Maysoon Lozada [facility]  [facility] Guide Direct Dial: [phone]  Fax: [phone] Website: [email or web address]

## 2024-02-13 DIAGNOSIS — N1832 Chronic kidney disease, stage 3b: Secondary | ICD-10-CM | POA: Diagnosis not present

## 2024-02-13 DIAGNOSIS — Z940 Kidney transplant status: Secondary | ICD-10-CM | POA: Diagnosis not present

## 2024-02-13 DIAGNOSIS — D849 Immunodeficiency, unspecified: Secondary | ICD-10-CM | POA: Diagnosis not present

## 2024-02-13 DIAGNOSIS — N2581 Secondary hyperparathyroidism of renal origin: Secondary | ICD-10-CM | POA: Diagnosis not present

## 2024-02-13 DIAGNOSIS — I10 Essential (primary) hypertension: Secondary | ICD-10-CM | POA: Diagnosis not present

## 2024-02-14 ENCOUNTER — Other Ambulatory Visit (HOSPITAL_BASED_OUTPATIENT_CLINIC_OR_DEPARTMENT_OTHER): Payer: Self-pay | Admitting: Family Medicine

## 2024-02-14 ENCOUNTER — Telehealth: Payer: Self-pay

## 2024-02-14 DIAGNOSIS — R630 Anorexia: Secondary | ICD-10-CM

## 2024-02-14 DIAGNOSIS — Z681 Body mass index (BMI) 19 or less, adult: Secondary | ICD-10-CM

## 2024-02-14 NOTE — Progress Notes (Signed)
 Complex Care Management Note Care Guide Note  02/14/2024 Name: Tyler Alvarez MRN: [id number] DOB: 11/28/1975  [name] is a 48 y.o. year old male who is a primary care patient of Knute, Thersia Bitters, FNP . The community resource team was consulted for assistance with Transportation Needs   SDOH screenings and interventions completed:  Yes  Social Drivers of Health From This Encounter   Food Insecurity: No Food Insecurity (02/14/2024)   Hunger Vital Sign    Worried About Running Out of Food in the Last Year: Never true    Ran Out of Food in the Last Year: Never true  Housing: Low Risk  (02/14/2024)   Housing Stability Vital Sign    Unable to Pay for Housing in the Last Year: No    Number of Times Moved in the Last Year: 0    Homeless in the Last Year: No  Financial Resource Strain: Low Risk  (02/14/2024)   Overall Financial Resource Strain (CARDIA)    Difficulty of Paying Living Expenses: Not hard at all  Transportation Needs: Unmet Transportation Needs (02/14/2024)   PRAPARE - Administrator, Civil Service (Medical): Yes    Lack of Transportation (Non-Medical): No  Utilities: Not At Risk (02/14/2024)   Utilities    Threatened with loss of utilities: No    SDOH Interventions Today    Flowsheet Row Most Recent Value  SDOH Interventions   Transportation Interventions Other (Comment)  [Verified email address to send [employer] transportation benefit information. Patient is currently using [facility] for transportation to appointments.]     Care guide performed the following interventions: Patient provided with information about care guide support team and interviewed to confirm resource needs.  Follow Up Plan:  Reminded [name] to check her spam folder if email is not in her inbox. [employer] [phone].  Encounter Outcome:  Patient Visit Completed  Mallori Araque [facility]  [employer]  Guide Direct Dial: [phone]  Fax: [phone] Website: [email or web address]

## 2024-02-20 NOTE — Progress Notes (Unsigned)
 Cardiology Office Note:    Date:  02/20/2024   ID:  Tyler Alvarez, DOB Dec 25, 1975, MRN [id number]  PCP:  Knute Thersia Bitters, FNP   [facility] Providers Cardiologist:  None { Click to update primary MD,subspecialty MD or APP then REFRESH:1}    Referring MD: Knute Thersia Bitters, *   No chief complaint on file. ***  History of Present Illness:    Tyler Alvarez is a 48 y.o. male is seen at the request of Alexis Olivia Caudle FNP for evaluation of aortic aneurysm.   Past Medical History:  Diagnosis Date   Abscess 03/22/2021   ADD (attention deficit disorder)    Anxiety    Ascending aortic aneurysm    Benign skin lesion of neck    left neck cyst   Cellulitis 02/24/2021   Cellulitis and abscess of buttock    Chronic kidney disease    s/p transplant 2001   Community acquired pneumonia 01/03/2020   Family history of adverse reaction to anesthesia    Pt mother stated Father-I think that he stopped breathing, they called a code but he came through okay   GERD (gastroesophageal reflux disease)    Headache    Migraines   Heart murmur    Hypertension    Immune deficiency disorder    Restless leg syndrome    Seizures (HCC)    treated by Dr. Lane, no seizures in years and years   Sepsis (HCC) 02/25/2021   Sepsis without acute organ dysfunction (HCC)    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HCC)    Wears glasses     Past Surgical History:  Procedure Laterality Date   AV FISTULA PLACEMENT     KIDNEY TRANSPLANT  2001   LESION EXCISION WITH COMPLEX REPAIR Left 05/24/2021   Procedure: EXCISION BENIGN LESION NECK 9CM, LAYERED CLOSURE NECK 10CM;  Surgeon: Arelia Filippo, MD;  Location: [facility] OR;  Service: Plastics;  Laterality: Left;   MASS EXCISION Left 12/11/2016   Procedure: EXCISION OF BENIGN LESION OF THE NECK WITH LAYERED CLOSURE;  Surgeon: Arelia Filippo, MD;  Location: [facility] OR;  Service: Plastics;  Laterality: Left;   MASS EXCISION     neck x2   MASS EXCISION  Bilateral 04/01/2018   Procedure: excision benign lesions left neck and right and left cheek ( 4cm, 2cm, 1cm x3), layered closure neck <10cm, layered closure right cheek 1cm;  Surgeon: Arelia Filippo, MD;  Location: [facility] OR;  Service: Plastics;  Laterality: Bilateral;  left cheek lesion   MULTIPLE TOOTH EXTRACTIONS     NEPHRECTOMY     both removed in 1999, 3 months apart   REVISON OF ARTERIOVENOUS FISTULA Left 07/16/2014   Procedure: EXCISION ANEURYSMAL AREA OF LEFT ARTERIOVENOUS FISTULA;  Surgeon: Gaile LELON New, MD;  Location: [facility] OR;  Service: Vascular;  Laterality: Left;    Current Medications: No outpatient medications have been marked as taking for the 02/22/24 encounter (Appointment) with [place], Roslin Norwood M, MD.     Allergies:   Erythromycin and Sulfa  antibiotics   Social History   Socioeconomic History   Marital status: Single    Spouse name: Not on file   Number of children: Not on file   Years of education: Not on file   Highest education level: Not on file  Occupational History   Not on file  Tobacco Use   Smoking status: Never   Smokeless tobacco: Never  Vaping Use   Vaping status: Never Used  Substance and Sexual Activity  Alcohol use: No   Drug use: No   Sexual activity: Yes  Other Topics Concern   Not on file  Social History Narrative   Caffeine one daily.  Monster every other day.  Lives at home with mom.  Education: 8th grade.   Single.  None children.     Social Drivers of Corporate investment banker Strain: Low Risk  (02/14/2024)   Overall Financial Resource Strain (CARDIA)    Difficulty of Paying Living Expenses: Not hard at all  Food Insecurity: No Food Insecurity (02/14/2024)   Hunger Vital Sign    Worried About Running Out of Food in the Last Year: Never true    Ran Out of Food in the Last Year: Never true  Transportation Needs: Unmet Transportation Needs (02/14/2024)   PRAPARE - Administrator, Civil Service (Medical): Yes    Lack of  Transportation (Non-Medical): No  Physical Activity: Insufficiently Active (02/08/2024)   Exercise Vital Sign    Days of Exercise per Week: 3 days    Minutes of Exercise per Session: 30 min  Stress: No Stress Concern Present (02/08/2024)   [employer] of Occupational Health - Occupational Stress Questionnaire    Feeling of Stress: Only a little  Social Connections: Socially Isolated (02/08/2024)   Social Connection and Isolation Panel    Frequency of Communication with Friends and Family: Three times a week    Frequency of Social Gatherings with Friends and Family: Never    Attends Religious Services: Never    Database administrator or Organizations: No    Attends Engineer, structural: Never    Marital Status: Never married     Family History: The patient's ***family history includes COPD in his brother and mother; COPD (age of onset: 67) in his father; Cancer in an other family member; Cancer (age of onset: 27) in his mother; Heart disease in his brother; Hypertension in his mother; Hypothyroidism in his mother; Ulcers in his father.  ROS:   Please see the history of present illness.    *** All other systems reviewed and are negative.  EKGs/Labs/Other Studies Reviewed:    The following studies were reviewed today: Echo 06/12/22: Summary   1. The left ventricle is normal in size with moderately to severely  increased wall thickness.    2. The left ventricular systolic function is hyperdynamic, LVEF is visually  estimated at >70%.   3. The right ventricle is normal in size, with normal systolic function.    4. Mildly dilated aortic root measuring 4.2 cm.  Ascending aorta measures  3.6 cm as seen.   CT chest 11/09/22: 1.  Unchanged tuberosclerosis associated lung disease with multifocal micronodular pneumocyte hyperplasia.  2.  Unchanged subsegmental and dependent atelectasis in the left lower lobe and lingula.  3.  Advanced for age moderate atherosclerotic  calcifications of the coronary arteries.  4.  Multiple focal fatty foci and intracardiac lipomas compatible with tuberous sclerosis complex.       Recent Labs: 07/30/2023: B Natriuretic Peptide 473.9; TSH 0.686 08/01/2023: Magnesium  1.4 12/15/2023: ALT 27 12/25/2023: BUN 39; Creatinine, Ser 2.33; Potassium 5.0; Sodium 140 01/15/2024: Hemoglobin 10.3; Platelets 210  Recent Lipid Panel No results found for: CHOL, TRIG, HDL, CHOLHDL, VLDL, LDLCALC, LDLDIRECT   Risk Assessment/Calculations:   {Does this patient have ATRIAL FIBRILLATION?:[phone]}  No BP recorded.  {Refresh Note OR Click here to enter BP  :1}***         Physical Exam:  VS:  There were no vitals taken for this visit.    Wt Readings from Last 3 Encounters:  01/17/24 121 lb (54.9 kg)  01/15/24 121 lb (54.9 kg)  01/02/24 113 lb (51.3 kg)     GEN: *** Well nourished, well developed in no acute distress HEENT: Normal NECK: No JVD; No carotid bruits LYMPHATICS: No lymphadenopathy CARDIAC: ***RRR, no murmurs, rubs, gallops RESPIRATORY:  Clear to auscultation without rales, wheezing or rhonchi  ABDOMEN: Soft, non-tender, non-distended MUSCULOSKELETAL:  No edema; No deformity  SKIN: Warm and dry NEUROLOGIC:  Alert and oriented x 3 PSYCHIATRIC:  Normal affect   ASSESSMENT:    No diagnosis found. PLAN:    In order of problems listed above:  ***      {Are you ordering a CV Procedure (e.g. stress test, cath, DCCV, TEE, etc)?   Press F2        :[id number]}    Medication Adjustments/Labs and Tests Ordered: Current medicines are reviewed at length with the patient today.  Concerns regarding medicines are outlined above.  No orders of the defined types were placed in this encounter.  No orders of the defined types were placed in this encounter.   There are no Patient Instructions on file for this visit.   Signed, Timmi Devora [place], MD  02/20/2024 1:11 PM    [facility]

## 2024-02-22 ENCOUNTER — Encounter: Payer: Self-pay | Admitting: Cardiology

## 2024-02-22 ENCOUNTER — Ambulatory Visit: Attending: Cardiology | Admitting: Cardiology

## 2024-02-22 VITALS — BP 126/80 | HR 73 | Ht 72.0 in | Wt 118.0 lb

## 2024-02-22 DIAGNOSIS — I10 Essential (primary) hypertension: Secondary | ICD-10-CM

## 2024-02-22 DIAGNOSIS — I700 Atherosclerosis of aorta: Secondary | ICD-10-CM | POA: Diagnosis not present

## 2024-02-22 DIAGNOSIS — I2510 Atherosclerotic heart disease of native coronary artery without angina pectoris: Secondary | ICD-10-CM

## 2024-02-22 NOTE — Progress Notes (Addendum)
 HEATON SARIN                                          MRN: [id number]   02/22/2024   The VBCI Quality Team Specialist reviewed this patient medical record for the purposes of chart review for care gap closure. The following were reviewed: chart review for care gap closure-colorectal cancer screening.    VBCI Quality Team

## 2024-02-22 NOTE — Patient Instructions (Signed)

## 2024-02-26 NOTE — Unmapped (Signed)
 This pharmacist was notified by a technician that this patient has reported that they've missed 2 doses of their Prograf .. I have reached out to the patient and discussed the missed doses. Upon investigation, he misses 2-3 doses routinely each month. His mother reports that sometimes he sleeps through his scheduled dose. I recommended that she set an alarm for his dosing times to help them remember. We will perform a clinical assessment for next fill to check in.      Approximate time spent: 5-10 minutes    Therisa FORBES Ra, PharmD, Clinical Specialty Pharmacist  [facility]

## 2024-02-26 NOTE — Unmapped (Signed)
 02/26/2024 - Clinical assessment date was due on 03/18/24. Reached out to pod 4 Mary E finnegan and got approval to proceed with scheduling the patients refill and to move the clinical assessment date to match the next refill coordination.      [facility] Refill Coordination Note    Specialty Medication(s) to be Shipped:   Transplant: Prograf  1mg     Other medication(s) to be shipped: No additional medications requested for fill at this time    Specialty Medications not needed at this time: N/A     Oscar [name], DOB: 07-Aug-1975  Phone: [phone] (home)       All above HIPAA information was verified with shelby     Was a translator used for this call? No    Completed refill call assessment today to schedule patient's medication shipment from the [facility]  [phone]).  All relevant notes have been reviewed.     Specialty medication(s) and dose(s) confirmed: Regimen is correct and unchanged.   Changes to medications: Myka reports no changes at this time.  Changes to insurance: No  New side effects reported not previously addressed with a pharmacist or physician: None reported  Questions for the pharmacist: No    Confirmed patient received a Conservation officer, historic buildings and a Surveyor, mining with first shipment. The patient will receive a drug information handout for each medication shipped and additional FDA Medication Guides as required.       DISEASE/MEDICATION-SPECIFIC INFORMATION        N/A    SPECIALTY MEDICATION ADHERENCE     Medication Adherence    Patient reported X missed doses in the last month: 0  Specialty Medication: PROGRAF  1 mg capsule (tacrolimus )  Patient is on additional specialty medications: No  Adherence tools used: patient uses a pill box to manage medications  Support network for adherence: family member              Were doses missed due to medication being on hold? No      PROGRAF  1 mg capsule (tacrolimus ): 10-14 days of medicine on hand       REFERRAL TO PHARMACIST     Referral to the pharmacist: Yes - routine compliance concerns. Patient has missed 1-3 doses of medication. Refills were scheduled and concern routed to pharmacist for evaluation.      SHIPPING     Shipping address confirmed in Epic.     Cost and Payment: Patient has a $0 copay, payment information is not required.    Delivery Scheduled: Yes, Expected medication delivery date: 03/06/24.     Medication will be delivered via UPS to the prescription address in Epic WAM.    [facility]  Specialty Technician

## 2024-03-05 ENCOUNTER — Other Ambulatory Visit: Payer: Self-pay

## 2024-03-05 MED FILL — PROGRAF 1 MG CAPSULE: ORAL | 30 days supply | Qty: 120 | Fill #6

## 2024-03-05 NOTE — Unmapped (Signed)
 UNOS forms

## 2024-03-05 NOTE — Patient Instructions (Signed)
 Visit Information  Thank you for taking time to visit with me today. Please don't hesitate to contact me if I can be of assistance to you before our next scheduled appointment.  Your next care management appointment is no further scheduled appointments.    Closing From: Complex Care Management. Please contact your care navigator through your [facility] Dual Complete Plan for care coordination needs such as transportation. The Care Navigator line is [phone].  Please call the care guide team at [phone] if you need to cancel, schedule, or reschedule an appointment.   Please call the Suicide and Crisis Lifeline: 988 call 1-800-273-TALK (toll free, 24 hour hotline) if you are experiencing a Mental Health or Behavioral Health Crisis or need someone to talk to.  Rosaline Finlay, RN MSN [place]  VBCI Population Health RN Care Manager Direct Dial: [phone]  Fax: [phone]

## 2024-03-05 NOTE — Patient Outreach (Signed)
 Complex Care Management   Visit Note  03/05/2024  Name:  Tyler Alvarez MRN: [id number] DOB: 1975-09-28  Situation: Referral received for Complex Care Management related to SDOH Barriers:  Transportation and recurrent abscess on back I obtained verbal consent from Parent.  Visit completed with Parent  on the phone  Background:   Past Medical History:  Diagnosis Date   Abscess 03/22/2021   ADD (attention deficit disorder)    Anxiety    Ascending aortic aneurysm    Benign skin lesion of neck    left neck cyst   Cellulitis 02/24/2021   Cellulitis and abscess of buttock    Chronic kidney disease    s/p transplant 2001   Community acquired pneumonia 01/03/2020   Family history of adverse reaction to anesthesia    Pt mother stated Father-I think that he stopped breathing, they called a code but he came through okay   GERD (gastroesophageal reflux disease)    Headache    Migraines   Heart murmur    Hypertension    Immune deficiency disorder    Restless leg syndrome    Seizures (HCC)    treated by Dr. Lane, no seizures in years and years   Sepsis (HCC) 02/25/2021   Sepsis without acute organ dysfunction (HCC)    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HCC)    Wears glasses     Assessment: Patient Reported Symptoms:  Cognitive Cognitive Status: Able to follow simple commands, Alert and oriented to person, place, and time, Normal speech and language skills, Difficulties with attention and concentration Cognitive/Intellectual Conditions Management [RPT]: Other Other: ADHD   Health Maintenance Behaviors: Annual physical exam, Hobbies, Sleep adequate Health Facilitated by: Rest  Neurological Neurological Review of Symptoms: No symptoms reported    HEENT HEENT Symptoms Reported: No symptoms reported      Cardiovascular Cardiovascular Symptoms Reported: No symptoms reported Does patient have uncontrolled Hypertension?: Yes Is patient checking Blood Pressure at home?: Yes Patient's Recent  BP reading at home: Issues related to hypotension per patient's mother. Pretty good recently, 120-130/70 Cardiovascular Management Strategies: Routine screening, Medication therapy  Respiratory Respiratory Symptoms Reported: No symptoms reported    Endocrine Endocrine Symptoms Reported: No symptoms reported Is patient diabetic?: No    Gastrointestinal Gastrointestinal Symptoms Reported: No symptoms reported Additional Gastrointestinal Details: Patient's mother denies patient reported issues related to bowel movements Gastrointestinal Comment: Note per chart review patient was scheduled for colonoscopy, but had to be cancelled due to hospitalizations. Ensured patient's mother has office phone number to contact to reschedule.    Genitourinary Genitourinary Symptoms Reported: No symptoms reported    Integumentary Integumentary Symptoms Reported: Other Other Integumentary Symptoms: Patient's mother reports an abscess on his back it's not big yet. She reports he is not currently on any antibiotics, no dressings needed. Note next appointment with PCP is 04/17/24. Patient's mother reports it does not look big enough to drain at this time. She will continue to keep an eye on it and will alert PCP for increased size. Skin Management Strategies: Routine screening  Musculoskeletal Musculoskelatal Symptoms Reviewed: No symptoms reported Musculoskeletal Management Strategies: Adequate rest, Routine screening, Medical device (Walker if he feels woozy per mother) Falls in the past year?: No Number of falls in past year: 1 or less Was there an injury with Fall?: No Fall Risk Category Calculator: 0 Patient Fall Risk Level: Low Fall Risk Patient at Risk for Falls Due to: No Fall Risks Fall risk Follow up: Falls evaluation completed, Education provided  Psychosocial  Psychosocial Symptoms Reported: Difficulty concentrating Additional Psychological Details: Patient's mother reports he gets a little  depressed sometimes. When feeling depressed, she reports patient will play video games. She reports patient does not see counselor or psychiatrist. She reports she will alert PCP if she feels he needs behavioral health intervention.          03/05/2024    PHQ2-9 Depression Screening   Little interest or pleasure in doing things    Feeling down, depressed, or hopeless    PHQ-2 - Total Score    Trouble falling or staying asleep, or sleeping too much    Feeling tired or having little energy    Poor appetite or overeating     Feeling bad about yourself - or that you are a failure or have let yourself or your family down    Trouble concentrating on things, such as reading the newspaper or watching television    Moving or speaking so slowly that other people could have noticed.  Or the opposite - being so fidgety or restless that you have been moving around a lot more than usual    Thoughts that you would be better off dead, or hurting yourself in some way    PHQ2-9 Total Score    If you checked off any problems, how difficult have these problems made it for you to do your work, take care of things at home, or get along with other people    Depression Interventions/Treatment      There were no vitals filed for this visit.  Medications Reviewed Today     Reviewed by Arno Rosaline SQUIBB, RN (Registered Nurse) on 03/05/24 at 1334  Med List Status: <None>   Medication Order Taking? Sig Documenting Provider Last Dose Status Informant  acetaminophen  (TYLENOL ) 500 MG tablet [id number]  Take 1,000 mg by mouth daily as needed for mild pain, headache or fever. [provider]  Active Pharmacy Records, Self           Med Note CHRISTIE ALEXANDER   Tue May 10, 2021  2:15 PM)    amLODipine  (NORVASC ) 5 MG tablet [id number]  Take 1 tablet (5 mg total) by mouth daily. Samtani, Jai-Gurmukh, MD  Active   gabapentin  (NEURONTIN ) 300 MG capsule [id number]  Take 1 capsule (300 mg total) by mouth at  bedtime. Knute Thersia Bitters, FNP  Active Pharmacy Records, Self  losartan  (COZAAR ) 25 MG tablet [id number]  Take 0.5 tablets (12.5 mg total) by mouth daily. Samtani, Jai-Gurmukh, MD  Active   ondansetron  (ZOFRAN -ODT) 4 MG disintegrating tablet [id number]  Take 1 tablet (4 mg total) by mouth every 8 (eight) hours as needed for nausea or vomiting. Knute Thersia Bitters, FNP  Active   predniSONE  (DELTASONE ) 5 MG tablet [id number]  Take 5 mg by mouth daily.  [provider]  Active Pharmacy Records, Self           Med Note JACKOLYN WADDELL DEL   Wed Aug 01, 2023  1:16 PM)    tacrolimus  (PROGRAF ) 1 MG capsule [id number]  Take 2 mg by mouth 2 (two) times daily.  [provider]  Active Pharmacy Records, Self            Recommendation:   Continue Current Plan of Care  Follow Up Plan:   Closing From:  Complex Care Management Patient's mother has been connected with care navigator through [facility]. She has been provided with [facility] Care Navigator line phone number and reports she has previously  spoken with patient's care navigator.   Rosaline Finlay, RN MSN [place]  VBCI Population Health RN Care Manager Direct Dial: [phone]  Fax: [phone]

## 2024-03-27 ENCOUNTER — Ambulatory Visit (HOSPITAL_BASED_OUTPATIENT_CLINIC_OR_DEPARTMENT_OTHER): Admitting: Family Medicine

## 2024-03-27 NOTE — Progress Notes (Signed)
 error

## 2024-03-31 ENCOUNTER — Encounter (HOSPITAL_BASED_OUTPATIENT_CLINIC_OR_DEPARTMENT_OTHER): Payer: Self-pay | Admitting: Family Medicine

## 2024-03-31 ENCOUNTER — Ambulatory Visit (INDEPENDENT_AMBULATORY_CARE_PROVIDER_SITE_OTHER): Admitting: Family Medicine

## 2024-03-31 VITALS — BP 136/98 | HR 85 | Ht 72.0 in | Wt 112.6 lb

## 2024-03-31 DIAGNOSIS — L0291 Cutaneous abscess, unspecified: Secondary | ICD-10-CM | POA: Diagnosis not present

## 2024-03-31 MED ORDER — DOXYCYCLINE HYCLATE 100 MG PO TABS: 100.0000 mg | ORAL_TABLET | Freq: Two times a day (BID) | ORAL | 0 refills | Status: DC

## 2024-03-31 MED ORDER — AMOXICILLIN 875 MG PO TABS: 875.0000 mg | ORAL_TABLET | Freq: Two times a day (BID) | ORAL | 0 refills | Status: AC

## 2024-03-31 NOTE — Patient Instructions (Addendum)
 Look for Bag Balm (green tin) to apply to abscess twice per day. Use warm compresses 2-3x daily.

## 2024-03-31 NOTE — Progress Notes (Signed)
 Acute Care Office Visit  Subjective:   Tyler Alvarez 05-03-1976 03/31/2024  Chief Complaint  Patient presents with   back abscess    Pt has a new abscess that has popped up on back which they noticed about 2 weeks ago which has gradually become worse.   ABSCESS: Onset: left upper back  Location: 2-3 weeks  Pain:  yes Quality:  ill-defined, pulling, shooting, sore, stabbing, tender, tearing, and throbbing Treatments attempted: pt's mother has been cleaning & dressing it.  Past MRSA skin infections:  no History of trauma in area:  no  Fever: no Redness:  yes Swelling:  yes Warmth:  yes Drainage:  yes- yellowish-green   The following portions of the patient's history were reviewed and updated as appropriate: past medical history, past surgical history, family history, social history, allergies, medications, and problem list.   Patient Active Problem List   Diagnosis Date Noted   Abscess of buttock 12/12/2023   Cellulitis of right buttock 12/12/2023   Intractable nausea and vomiting 07/30/2023   Influenza A 07/30/2023   Underweight (BMI < 18.5) 05/30/2023   Abscess of multiple sites 03/22/2021   History of seizures 03/22/2021   Right foot pain 06/16/2020   Anemia 01/20/2020   Electrolyte abnormality 01/20/2020   Gastroesophageal reflux disease    Acute-on-chronic kidney injury 09/06/2019   Gout 10/09/2017   CKD (chronic kidney disease), stage III (HCC) 10/09/2017   Immunosuppression 04/25/2015   Normocytic anemia    Renal transplant, status post    Essential hypertension    Aneurysm 07/16/2014   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   Aftercare following organ transplant 05/23/2011   Attention deficit hyperactivity disorder (ADHD) 04/20/2011   Partial epilepsy with impairment of consciousness (HCC) 04/20/2011   HEARING LOSS, LEFT EAR 11/25/2008   Past Medical History:  Diagnosis Date   Abscess 03/22/2021   ADD (attention deficit disorder)    Anxiety     Ascending aortic aneurysm    Benign skin lesion of neck    left neck cyst   Cellulitis 02/24/2021   Cellulitis and abscess of buttock    Chronic kidney disease    s/p transplant 2001   Community acquired pneumonia 01/03/2020   Family history of adverse reaction to anesthesia    Pt mother stated Father-I think that he stopped breathing, they called a code but he came through okay   GERD (gastroesophageal reflux disease)    Headache    Migraines   Heart murmur    Hypertension    Immune deficiency disorder    Restless leg syndrome    Seizures (HCC)    treated by Dr. Lane, no seizures in years and years   Sepsis (HCC) 02/25/2021   Sepsis without acute organ dysfunction (HCC)    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HCC)    Wears glasses    Past Surgical History:  Procedure Laterality Date   AV FISTULA PLACEMENT     KIDNEY TRANSPLANT  2001   LESION EXCISION WITH COMPLEX REPAIR Left 05/24/2021   Procedure: EXCISION BENIGN LESION NECK 9CM, LAYERED CLOSURE NECK 10CM;  Surgeon: Arelia Filippo, MD;  Location: [facility] OR;  Service: Plastics;  Laterality: Left;   MASS EXCISION Left 12/11/2016   Procedure: EXCISION OF BENIGN LESION OF THE NECK WITH LAYERED CLOSURE;  Surgeon: Arelia Filippo, MD;  Location: [facility] OR;  Service: Plastics;  Laterality: Left;   MASS EXCISION     neck x2   MASS EXCISION Bilateral 04/01/2018   Procedure:  excision benign lesions left neck and right and left cheek ( 4cm, 2cm, 1cm x3), layered closure neck <10cm, layered closure right cheek 1cm;  Surgeon: Arelia Filippo, MD;  Location: [facility] OR;  Service: Plastics;  Laterality: Bilateral;  left cheek lesion   MULTIPLE TOOTH EXTRACTIONS     NEPHRECTOMY     both removed in 1999, 3 months apart   REVISON OF ARTERIOVENOUS FISTULA Left 07/16/2014   Procedure: EXCISION ANEURYSMAL AREA OF LEFT ARTERIOVENOUS FISTULA;  Surgeon: Gaile LELON New, MD;  Location: [facility] OR;  Service: Vascular;  Laterality: Left;   Family History  Problem Relation  Age of Onset   Hypothyroidism Mother    Cancer Mother 25       cervical   Hypertension Mother    COPD Mother    COPD Father 10       COPD   Ulcers Father    COPD Brother    Heart disease Brother    Cancer Other    Outpatient Medications Prior to Visit  Medication Sig Dispense Refill   acetaminophen  (TYLENOL ) 500 MG tablet Take 1,000 mg by mouth daily as needed for mild pain, headache or fever.     amLODipine  (NORVASC ) 5 MG tablet Take 1 tablet (5 mg total) by mouth daily.     gabapentin  (NEURONTIN ) 300 MG capsule Take 1 capsule (300 mg total) by mouth at bedtime. 90 capsule 3   losartan  (COZAAR ) 25 MG tablet Take 0.5 tablets (12.5 mg total) by mouth daily.     ondansetron  (ZOFRAN -ODT) 4 MG disintegrating tablet Take 1 tablet (4 mg total) by mouth every 8 (eight) hours as needed for nausea or vomiting. 20 tablet 0   predniSONE  (DELTASONE ) 5 MG tablet Take 5 mg by mouth daily.      tacrolimus  (PROGRAF ) 1 MG capsule Take 2 mg by mouth 2 (two) times daily.      No facility-administered medications prior to visit.   Allergies  Allergen Reactions   Erythromycin Nausea And Vomiting   Sulfa  Antibiotics Itching and Nausea And Vomiting     ROS: A complete ROS was performed with pertinent positives/negatives noted in the HPI. The remainder of the ROS are negative.    Objective:   Today's Vitals   03/31/24 1313  BP: (!) 136/98  Pulse: 85  SpO2: 100%  Weight: 51.1 kg  Height: 6' (1.829 m)    GENERAL: Thin appearance, in NAD. Well nourished.  SKIN: Pink, warm and dry. 1.5cm circular area of fluctuance with surrounding erythema to mid upper back between scapulae. Small area of green drainage expressed from a 0.1cm opening at the 7 o'clock position of the abscess. Drainage is easily expressed with light-moderate palpation of the abscess.  Head: Normocephalic. NECK: Trachea midline.  THROAT: Uvula midline.  RESPIRATORY: Chest wall symmetrical. Respirations even and non-labored.   CARDIAC: Peripheral pulses 2+ bilaterally.  MSK: Muscle tone and strength appropriate for age. Joints w/o tenderness, redness, or swelling.  EXTREMITIES: Without clubbing, cyanosis, or edema.  NEUROLOGIC: No motor or sensory deficits. Steady, even gait. C2-C12 intact.  PSYCH/MENTAL STATUS: Alert, oriented x 3. Cooperative, appropriate mood and affect.    No results found for any visits on 03/31/24.    Assessment & Plan:  1. Abscess (Primary) Amoxicillin  875mg  BID x7days & Doxycycline  100mg  BID x7days sent to pt's pharmacy. Pt advised to use warm compresses 2-3x daily & to look for bag balm OTC to apply to the abscess BID. He is to RTO in 4days for  recheck or sooner if sx worsen or change. Pt and mother verbalized understanding.    Lab Orders  No laboratory test(s) ordered today   No images are attached to the encounter or orders placed in the encounter.  Return in about 4 days (around 04/04/2024) for Wound Recheck .    Patient to reach out to office if new, worrisome, or unresolved symptoms arise or if no improvement in patient's condition. Patient verbalized understanding and is agreeable to treatment plan. All questions answered to patient's satisfaction.   Treatment plan and recommendation(s) reviewed by supervising preceptor, Thersia CLEMENTEEN Stark, FNP-C, prior to clinic discharge.    Rosina Ada, BSN, RN DNP Student

## 2024-03-31 NOTE — Progress Notes (Signed)
 Acute Care Office Visit  Subjective:   Tyler Alvarez 1975-11-05 03/31/2024  Chief Complaint  Patient presents with   back abscess    Pt has a new abscess that has popped up on back which they noticed about 2 weeks ago which has gradually become worse.   ABSCESS: Onset: left upper back  Location: 2-3 weeks  Pain:  yes Quality:  ill-defined, pulling, shooting, sore, stabbing, tender, tearing, and throbbing Treatments attempted: pt's mother has been cleaning & dressing it.  Past MRSA skin infections:  no History of trauma in area:  no  Fever: no Redness:  yes Swelling:  yes Warmth:  yes Drainage:  yes- yellowish-green   The following portions of the patient's history were reviewed and updated as appropriate: past medical history, past surgical history, family history, social history, allergies, medications, and problem list.   Patient Active Problem List   Diagnosis Date Noted   Abscess of buttock 12/12/2023   Cellulitis of right buttock 12/12/2023   Intractable nausea and vomiting 07/30/2023   Influenza A 07/30/2023   Underweight (BMI < 18.5) 05/30/2023   Abscess of multiple sites 03/22/2021   History of seizures 03/22/2021   Right foot pain 06/16/2020   Anemia 01/20/2020   Electrolyte abnormality 01/20/2020   Gastroesophageal reflux disease    Acute-on-chronic kidney injury 09/06/2019   Gout 10/09/2017   CKD (chronic kidney disease), stage III (HCC) 10/09/2017   Immunosuppression 04/25/2015   Normocytic anemia    Renal transplant, status post    Essential hypertension    Aneurysm 07/16/2014   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   Aftercare following organ transplant 05/23/2011   Attention deficit hyperactivity disorder (ADHD) 04/20/2011   Partial epilepsy with impairment of consciousness (HCC) 04/20/2011   HEARING LOSS, LEFT EAR 11/25/2008   Past Medical History:  Diagnosis Date   Abscess 03/22/2021   ADD (attention deficit disorder)    Anxiety     Ascending aortic aneurysm    Benign skin lesion of neck    left neck cyst   Cellulitis 02/24/2021   Cellulitis and abscess of buttock    Chronic kidney disease    s/p transplant 2001   Community acquired pneumonia 01/03/2020   Family history of adverse reaction to anesthesia    Pt mother stated Father-I think that he stopped breathing, they called a code but he came through okay   GERD (gastroesophageal reflux disease)    Headache    Migraines   Heart murmur    Hypertension    Immune deficiency disorder    Restless leg syndrome    Seizures (HCC)    treated by Dr. Lane, no seizures in years and years   Sepsis (HCC) 02/25/2021   Sepsis without acute organ dysfunction (HCC)    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HCC)    Wears glasses    Past Surgical History:  Procedure Laterality Date   AV FISTULA PLACEMENT     KIDNEY TRANSPLANT  2001   LESION EXCISION WITH COMPLEX REPAIR Left 05/24/2021   Procedure: EXCISION BENIGN LESION NECK 9CM, LAYERED CLOSURE NECK 10CM;  Surgeon: Arelia Filippo, MD;  Location: [facility] OR;  Service: Plastics;  Laterality: Left;   MASS EXCISION Left 12/11/2016   Procedure: EXCISION OF BENIGN LESION OF THE NECK WITH LAYERED CLOSURE;  Surgeon: Arelia Filippo, MD;  Location: [facility] OR;  Service: Plastics;  Laterality: Left;   MASS EXCISION     neck x2   MASS EXCISION Bilateral 04/01/2018   Procedure:  excision benign lesions left neck and right and left cheek ( 4cm, 2cm, 1cm x3), layered closure neck <10cm, layered closure right cheek 1cm;  Surgeon: Arelia Filippo, MD;  Location: [facility] OR;  Service: Plastics;  Laterality: Bilateral;  left cheek lesion   MULTIPLE TOOTH EXTRACTIONS     NEPHRECTOMY     both removed in 1999, 3 months apart   REVISON OF ARTERIOVENOUS FISTULA Left 07/16/2014   Procedure: EXCISION ANEURYSMAL AREA OF LEFT ARTERIOVENOUS FISTULA;  Surgeon: Gaile LELON New, MD;  Location: [facility] OR;  Service: Vascular;  Laterality: Left;   Family History  Problem Relation  Age of Onset   Hypothyroidism Mother    Cancer Mother 38       cervical   Hypertension Mother    COPD Mother    COPD Father 41       COPD   Ulcers Father    COPD Brother    Heart disease Brother    Cancer Other    Outpatient Medications Prior to Visit  Medication Sig Dispense Refill   acetaminophen  (TYLENOL ) 500 MG tablet Take 1,000 mg by mouth daily as needed for mild pain, headache or fever.     amLODipine  (NORVASC ) 5 MG tablet Take 1 tablet (5 mg total) by mouth daily.     gabapentin  (NEURONTIN ) 300 MG capsule Take 1 capsule (300 mg total) by mouth at bedtime. 90 capsule 3   losartan  (COZAAR ) 25 MG tablet Take 0.5 tablets (12.5 mg total) by mouth daily.     ondansetron  (ZOFRAN -ODT) 4 MG disintegrating tablet Take 1 tablet (4 mg total) by mouth every 8 (eight) hours as needed for nausea or vomiting. 20 tablet 0   predniSONE  (DELTASONE ) 5 MG tablet Take 5 mg by mouth daily.      tacrolimus  (PROGRAF ) 1 MG capsule Take 2 mg by mouth 2 (two) times daily.      No facility-administered medications prior to visit.   Allergies  Allergen Reactions   Erythromycin Nausea And Vomiting   Sulfa  Antibiotics Itching and Nausea And Vomiting     ROS: A complete ROS was performed with pertinent positives/negatives noted in the HPI. The remainder of the ROS are negative.    Objective:   Today's Vitals   03/31/24 1313  BP: (!) 136/98  Pulse: 85  SpO2: 100%  Weight: 112 lb 9.6 oz (51.1 kg)  Height: 6' (1.829 m)    GENERAL: Thin appearance, in NAD. Well nourished.  SKIN: Pink, warm and dry. 1.5cm circular area of fluctuance with surrounding erythema to mid upper back between scapulae. Small area of green drainage expressed from a 0.1cm opening at the 7 o'clock position of the abscess. Drainage is easily expressed with light-moderate palpation of the abscess.  Head: Normocephalic. NECK: Trachea midline.  THROAT: Uvula midline.  RESPIRATORY: Chest wall symmetrical. Respirations even and  non-labored.  CARDIAC: Peripheral pulses 2+ bilaterally.  MSK: Muscle tone and strength appropriate for age. Joints w/o tenderness, redness, or swelling.  EXTREMITIES: Without clubbing, cyanosis, or edema.  NEUROLOGIC: No motor or sensory deficits. Steady, even gait. C2-C12 intact.  PSYCH/MENTAL STATUS: Alert, oriented x 3. Cooperative, appropriate mood and affect.    No results found for any visits on 03/31/24.    Assessment & Plan:  1. Abscess (Primary) Amoxicillin  875mg  BID x7days & Doxycycline  100mg  BID x7days sent to pt's pharmacy. Pt advised to use warm compresses 2-3x daily & to look for bag balm OTC to apply to the abscess BID. He is to  RTO in 4days for recheck or sooner if sx worsen or change. Pt and mother verbalized understanding.    Lab Orders  No laboratory test(s) ordered today   No images are attached to the encounter or orders placed in the encounter.  Return in about 4 days (around 04/04/2024) for Wound Recheck .    Patient to reach out to office if new, worrisome, or unresolved symptoms arise or if no improvement in patient's condition. Patient verbalized understanding and is agreeable to treatment plan. All questions answered to patient's satisfaction.   Thersia Stark, FNP-C

## 2024-04-02 NOTE — Progress Notes (Signed)
 [facility] Clinical Assessment & Refill Coordination Note    Oscar [name], DOB: 1975/05/17  Phone: [phone] (home)     All above HIPAA information was verified with patient's family member, mother.     Was a nurse, learning disability used for this call? No    Specialty Medication(s):   Transplant: Prograf  1mg      Current Medications[1]     Changes to medications: Ric reports no changes at this time.    Medication list has been reviewed and updated in Epic: Yes    Allergies[2]    Changes to allergies: No    Allergies have been reviewed and updated in Epic: Yes    SPECIALTY MEDICATION ADHERENCE     Prograf  1 mg: 14 days of medicine on hand       Medication Adherence    Patient reported X missed doses in the last month: 1  Specialty Medication: Prograf  1mg   Patient is on additional specialty medications: No  Informant: mother  Adherence tools used: patient uses a pill box to manage medications  Support network for adherence: family member          Specialty medication(s) dose(s) confirmed: Regimen is correct and unchanged.     Are there any concerns with adherence? No    Adherence counseling provided? Not needed    CLINICAL MANAGEMENT AND INTERVENTION      Clinical Benefit Assessment:    Do you feel the medicine is effective or helping your condition? Yes    Clinical Benefit counseling provided? Not needed    Adverse Effects Assessment:    Are you experiencing any side effects? No    Are you experiencing difficulty administering your medicine? No    Quality of Life Assessment:    Quality of Life    Rheumatology  Oncology  Dermatology  Cystic Fibrosis          How many days over the past month did your kidney transplant  keep you from your normal activities? For example, brushing your teeth or getting up in the morning. 0    Have you discussed this with your provider? Not needed    Acute Infection Status:    Acute infections noted within Epic:  No active infections    Patient reported infection: None    Therapy Appropriateness:    Is the medication and dose appropriate considering the patient???s diagnosis, treatment, and disease journey, comorbidities, medical history, current medications, allergies, therapeutic goals, self-administration ability, and access barriers? Yes, therapy is appropriate and should be continued     Clinical Intervention:    Was an intervention completed as part of this clinical assessment? No    DISEASE/MEDICATION-SPECIFIC INFORMATION      N/A    Solid Organ Transplant: Not Applicable    PATIENT SPECIFIC NEEDS     Does the patient have any physical, cognitive, or cultural barriers? No    Is the patient high risk? No    Does the patient require physician intervention or other additional services (i.e., nutrition, smoking cessation, social work)? No    Does the patient have an additional or emergency contact listed in their chart? Yes    SOCIAL DETERMINANTS OF HEALTH     At the [facility], we have learned that life circumstances - like trouble affording food, housing, utilities, or transportation can affect the health of many of our patients.   That is why we wanted to ask: are you currently experiencing any life circumstances  that are negatively impacting your health and/or quality of life? Patient declined to answer    Social Drivers of Health     Food Insecurity: No Food Insecurity (12/17/2023)    Received from [facility]    Hunger Vital Sign     Within the past 12 months, you worried that your food would run out before you got the money to buy more.: Never true     Within the past 12 months, the food you bought just didn't last and you didn't have money to get more.: Never true   Tobacco Use: Low Risk  (11/28/2023)    Received from [facility]    Patient History     Smoking Tobacco Use: Never     Smokeless Tobacco Use: Never     Passive Exposure: Not on file   Transportation Needs: No Transportation Needs (12/17/2023)    Received from [facility] - Transportation     In the past 12 months, has lack of transportation kept you from medical appointments or from getting medications?: No     In the past 12 months, has lack of transportation kept you from meetings, work, or from getting things needed for daily living?: No   Alcohol Use: Not on file   Housing: Not on file   Physical Activity: Insufficiently Active (12/15/2022)    Received from [facility]    Exercise Vital Sign     On average, how many days per week do you engage in moderate to strenuous exercise (like a brisk walk)?: 3 days     On average, how many minutes do you engage in exercise at this level?: 30 min   Utilities: Not At Risk (12/17/2023)    Received from [facility] Utilities     In the past 12 months has the electric, gas, oil, or water company threatened to shut off services in your home?: No   Stress: No Stress Concern Present (12/15/2022)    Received from [facility] - Occupational Stress Questionnaire     Feeling of Stress : Not at all   Interpersonal Safety: Not on file   Substance Use: Not on file (03/19/2023)   Intimate Partner Violence: Not At Risk (12/17/2023)    Received from [facility]    Humiliation, Afraid, Rape, and Kick questionnaire     Within the last year, have you been afraid of your partner or ex-partner?: No     Within the last year, have you been humiliated or emotionally abused in other ways by your partner or ex-partner?: No     Within the last year, have you been kicked, hit, slapped, or otherwise physically hurt by your partner or ex-partner?: No     Within the last year, have you been raped or forced to have any kind of sexual activity by your partner or ex-partner?: No   Social Connections: Socially Isolated (12/15/2022)    Received from [facility]    Social Connection and Isolation Panel     In a typical week, how many times do you talk on the phone with family, friends, or neighbors?: More than three times a week     How often do you get together with friends or relatives?: Three times a week     How often do you attend church or religious services?: Never     Do you belong to any clubs or organizations  such as church groups, unions, fraternal or athletic groups, or school groups?: No     How often do you attend meetings of the clubs or organizations you belong to?: Never     Are you married, widowed, divorced, separated, never married, or living with a partner?: Never married   Physicist, Medical Strain: Low Risk  (12/15/2022)    Received from [facility]    Overall Financial Resource Strain (CARDIA)     Difficulty of Paying Living Expenses: Not hard at all   Health Literacy: Adequate Health Literacy (12/15/2022)    Received from [facility]    B1300 Health Literacy     Frequency of need for help with medical instructions: Rarely   Internet Connectivity: Not on file       Would you be willing to receive help with any of the needs that you have identified today? Not applicable       SHIPPING     Specialty Medication(s) to be Shipped:   Transplant: Prograf  1mg     Other medication(s) to be shipped: Prednisone  5mg     Specialty Medications not needed at this time: N/A     Changes to insurance: No    Cost and Payment: Patient has a $0 copay, payment information is not required.    Delivery Scheduled: Yes, Expected medication delivery date: 11/24.     Medication will be delivered via UPS to the confirmed prescription address in [facility].    The patient will receive a drug information handout for each medication shipped and additional FDA Medication Guides as required.  Verified that patient has previously received a Conservation Officer, Historic Buildings and a Surveyor, Mining.    The patient or caregiver noted above participated in the development of this care plan and knows that they can request review of or adjustments to the care plan at any time.      All of the patient's questions and concerns have been addressed.    Harlene Gal, PharmD   [facility] Specialty Pharmacist       [1]   Current Outpatient Medications   Medication Sig Dispense Refill    amlodipine  (NORVASC ) 5 MG tablet TAKE 1 TABLET BY MOUTH EVERY DAY IN THE EVENING 90 tablet 3    gabapentin (NEURONTIN) 300 MG capsule Take 1 capsule (300 mg total) by mouth nightly. 90 capsule 3    losartan  (COZAAR ) 25 MG tablet TAKE 1 TABLET BY MOUTH TWO TIMES A DAY. 180 tablet 3    ondansetron  (ZOFRAN -ODT) 4 MG disintegrating tablet Take 1 tablet (4 mg total) by mouth every eight (8) hours as needed for nausea. 15 tablet 0    predniSONE  (DELTASONE ) 5 MG tablet Take 1 tablet (5 mg total) by mouth daily. 90 tablet 3    PROGRAF  1 mg capsule Take 2 capsules (2 mg total) by mouth two (2) times a day. 360 capsule 3     No current facility-administered medications for this visit.   [2]   Allergies  Allergen Reactions    Erythromycin     Erythromycin Base      Other reaction(s): NAUSEA    Sulfa (Sulfonamide Antibiotics)      Other reaction(s): ITCHING    Sulfacetamide Sodium

## 2024-04-04 ENCOUNTER — Ambulatory Visit (INDEPENDENT_AMBULATORY_CARE_PROVIDER_SITE_OTHER): Admitting: Family Medicine

## 2024-04-04 ENCOUNTER — Encounter (HOSPITAL_BASED_OUTPATIENT_CLINIC_OR_DEPARTMENT_OTHER): Payer: Self-pay | Admitting: Family Medicine

## 2024-04-04 VITALS — BP 145/97 | HR 92 | Ht 72.0 in | Wt 112.6 lb

## 2024-04-04 DIAGNOSIS — R630 Anorexia: Secondary | ICD-10-CM | POA: Diagnosis not present

## 2024-04-04 DIAGNOSIS — L0291 Cutaneous abscess, unspecified: Secondary | ICD-10-CM

## 2024-04-04 MED ORDER — ONDANSETRON 4 MG PO TBDP: 4.0000 mg | ORAL_TABLET | Freq: Three times a day (TID) | ORAL | 0 refills | Status: AC | PRN

## 2024-04-04 MED FILL — PREDNISONE 5 MG TABLET: ORAL | 90 days supply | Qty: 90 | Fill #3

## 2024-04-04 MED FILL — PROGRAF 1 MG CAPSULE: ORAL | 30 days supply | Qty: 120 | Fill #7

## 2024-04-04 NOTE — Progress Notes (Signed)
 Acute Care Office Visit  Subjective:   Tyler Alvarez [birth year] 04/04/2024  Chief Complaint  Patient presents with   Wound Check    Pt is here today to have wound on back rechecked after recent abx. Pt's mother states that she feels like area is improving. Is draining some when she changes the bandage.    HPI: Patient is here for follow-up from abscess to right upper back in which he came to the office for on 03/31/2024.  There was yellow serous drainage from that area at the time without need for incision and drainage during the office visit.  Patient was started on doxycycline  and Augmentin  given improvement previously with prior abscesses.  He returns today for wound check and states that the area may be improving and is having some drainage, but there is significant tenderness and some swelling to the area.  Denies fevers, chills.   The following portions of the patient's history were reviewed and updated as appropriate: past medical history, past surgical history, family history, social history, allergies, medications, and problem list.   Patient Active Problem List   Diagnosis Date Noted   Abscess of buttock 12/12/2023   Cellulitis of right buttock 12/12/2023   Intractable nausea and vomiting 07/30/2023   Influenza A 07/30/2023   Underweight (BMI < 18.5) 05/30/2023   Abscess of multiple sites 03/22/2021   History of seizures 03/22/2021   Right foot pain 06/16/2020   Anemia 01/20/2020   Electrolyte abnormality 01/20/2020   Gastroesophageal reflux disease    Acute-on-chronic kidney injury 09/06/2019   Gout 10/09/2017   CKD (chronic kidney disease), stage III (HCC) 10/09/2017   Immunosuppression 04/25/2015   Normocytic anemia    Renal transplant, status post    Essential hypertension    Aneurysm 07/16/2014   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   Aftercare following organ transplant 05/23/2011   Attention deficit hyperactivity disorder (ADHD) 04/20/2011    Partial epilepsy with impairment of consciousness (HCC) 04/20/2011   HEARING LOSS, LEFT EAR 11/25/2008   Past Medical History:  Diagnosis Date   Abscess 03/22/2021   ADD (attention deficit disorder)    Anxiety    Ascending aortic aneurysm    Benign skin lesion of neck    left neck cyst   Cellulitis 02/24/2021   Cellulitis and abscess of buttock    Chronic kidney disease    s/p transplant 2001   Community acquired pneumonia 01/03/2020   Family history of adverse reaction to anesthesia    Pt mother stated Father-I think that he stopped breathing, they called a code but he came through okay   GERD (gastroesophageal reflux disease)    Headache    Migraines   Heart murmur    Hypertension    Immune deficiency disorder    Restless leg syndrome    Seizures (HCC)    treated by Dr. Lane, no seizures in years and years   Sepsis (HCC) 02/25/2021   Sepsis without acute organ dysfunction (HCC)    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HCC)    Wears glasses    Past Surgical History:  Procedure Laterality Date   AV FISTULA PLACEMENT     KIDNEY TRANSPLANT  2001   LESION EXCISION WITH COMPLEX REPAIR Left 05/24/2021   Procedure: EXCISION BENIGN LESION NECK 9CM, LAYERED CLOSURE NECK 10CM;  Surgeon: Arelia Filippo, MD;  Location: [facility] OR;  Service: Plastics;  Laterality: Left;   MASS EXCISION Left 12/11/2016   Procedure: EXCISION OF BENIGN LESION OF THE  NECK WITH LAYERED CLOSURE;  Surgeon: Arelia Filippo, MD;  Location: [facility] OR;  Service: Plastics;  Laterality: Left;   MASS EXCISION     neck x2   MASS EXCISION Bilateral 04/01/2018   Procedure: excision benign lesions left neck and right and left cheek ( 4cm, 2cm, 1cm x3), layered closure neck <10cm, layered closure right cheek 1cm;  Surgeon: Arelia Filippo, MD;  Location: [facility] OR;  Service: Plastics;  Laterality: Bilateral;  left cheek lesion   MULTIPLE TOOTH EXTRACTIONS     NEPHRECTOMY     both removed in 1999, 3 months apart   REVISON OF ARTERIOVENOUS  FISTULA Left 07/16/2014   Procedure: EXCISION ANEURYSMAL AREA OF LEFT ARTERIOVENOUS FISTULA;  Surgeon: Gaile LELON New, MD;  Location: [facility] OR;  Service: Vascular;  Laterality: Left;   Family History  Problem Relation Age of Onset   Hypothyroidism Mother    Cancer Mother 55       cervical   Hypertension Mother    COPD Mother    COPD Father 53       COPD   Ulcers Father    COPD Brother    Heart disease Brother    Cancer Other    Outpatient Medications Prior to Visit  Medication Sig Dispense Refill   acetaminophen  (TYLENOL ) 500 MG tablet Take 1,000 mg by mouth daily as needed for mild pain, headache or fever.     amLODipine  (NORVASC ) 5 MG tablet Take 1 tablet (5 mg total) by mouth daily.     amoxicillin  (AMOXIL ) 875 MG tablet Take 1 tablet (875 mg total) by mouth 2 (two) times daily for 7 days. 14 tablet 0   doxycycline  (VIBRA -TABS) 100 MG tablet Take 1 tablet (100 mg total) by mouth 2 (two) times daily. 14 tablet 0   gabapentin  (NEURONTIN ) 300 MG capsule Take 1 capsule (300 mg total) by mouth at bedtime. 90 capsule 3   losartan  (COZAAR ) 25 MG tablet Take 0.5 tablets (12.5 mg total) by mouth daily.     predniSONE  (DELTASONE ) 5 MG tablet Take 5 mg by mouth daily.      tacrolimus  (PROGRAF ) 1 MG capsule Take 2 mg by mouth 2 (two) times daily.      ondansetron  (ZOFRAN -ODT) 4 MG disintegrating tablet Take 1 tablet (4 mg total) by mouth every 8 (eight) hours as needed for nausea or vomiting. 20 tablet 0   No facility-administered medications prior to visit.   Allergies  Allergen Reactions   Erythromycin Nausea And Vomiting   Sulfa  Antibiotics Itching and Nausea And Vomiting     ROS: A complete ROS was performed with pertinent positives/negatives noted in the HPI. The remainder of the ROS are negative.    Objective:   Today's Vitals   04/04/24 1042  BP: (!) 145/97  Pulse: 92  SpO2: 97%  Weight: 112 lb 9.6 oz (51.1 kg)  Height: 6' (1.829 m)    GENERAL: Well-appearing, in NAD.  Well nourished.  SKIN: Pink, warm and dry. 1.5cm circular area of abscess with surrounding erythema to  right mid upper back between scapulae. Unable to express drainage with palpation.  Head: Normocephalic. NECK: Trachea midline. Full ROM w/o pain or tenderness.  RESPIRATORY: Chest wall symmetrical. Respirations even and non-labored.  MSK: Muscle tone and strength appropriate for age.  NEUROLOGIC: No motor or sensory deficits. Steady, even gait. C2-C12 intact.  PSYCH/MENTAL STATUS: Alert, oriented x 3. Cooperative, appropriate mood and affect.   I & D  Date/Time: 04/04/2024 1:00 PM  Performed by: Knute Thersia Bitters, FNP Authorized by: Knute Thersia Bitters, FNP   Consent:    Consent obtained:  Written   Consent given by:  Patient   Risks, benefits, and alternatives were discussed: yes     Risks discussed:  Bleeding, incomplete drainage, pain and infection   Alternatives discussed:  Alternative treatment, observation and referral Location:    Type:  Abscess   Size:  1.5cm   Location:  Trunk   Trunk location:  Back Pre-procedure details:    Skin preparation:  Povidone-iodine Sedation:    Sedation type:  None Anesthesia:    Anesthesia method:  Local infiltration   Local anesthetic:  Lidocaine  1% w/o epi Procedure type:    Complexity:  Simple Procedure details:    Needle aspiration: no     Incision types:  Stab incision   Incision depth:  Dermal   Scalpel blade:  10   Wound management:  Probed and deloculated and irrigated with saline   Drainage:  Bloody and purulent   Drainage amount:  Scant   Wound treatment:  Wound left open   Packing materials:  None Post-procedure details:    Procedure completion:  Tolerated well, no immediate complications Comments:        Informed verbal and written consent obtained after discussion of risks (including but not limited to pain, infection, bleeding, damage to surrounding tissues, incomplete evacuation/treatment of infection,  recurrence)  and benefits (adequate treatment and diagnosis, relief of pain). All questions answered prior to procedure.   A timeout protocol was performed prior to initiating the procedure.  The area was prepared and draped in the usual, sterile manner. The site was anesthetized with 1 cc 1% lidocaine  without epinephrine . A linear incision along the local skin lines was made and the purulent material expressed. The abcess was explored thoroughly and sequestered pockets were evacuated. Bleeding was minimal.   Packing: None  Sterile bandage applied.    Follow-up: The patient tolerated the procedure well without complications. Standard post-procedure care is explained and return precautions are given, patient was provided with printed information & instructions.       Assessment & Plan:  1. Abscess (Primary) Patient had significantly less drainage with incision and drainage performed today in office compared to office visit on 1117.  Abscess is continuing to improve.  Patient will continue and complete course of antibiotics.  Recommend patient's mother help with warm compresses to the area 3-4 times a day with antibiotic ointment and dressing changes.  I will follow-up with the patient in 1 to 2 weeks as scheduled or sooner if area of redness, drainage, or pain occurs.  I do believe patient would benefit from the wound care center given the recurrence of this abscess for further management and referral is placed. - AMB referral to wound care center   Meds ordered this encounter  Medications   ondansetron  (ZOFRAN -ODT) 4 MG disintegrating tablet    Sig: Take 1 tablet (4 mg total) by mouth every 8 (eight) hours as needed for nausea or vomiting.    Dispense:  20 tablet    Refill:  0    Supervising Provider:   DE CUBA, RAYMOND J [[id number]]   Lab Orders  No laboratory test(s) ordered today    Return if symptoms worsen or fail to improve.    Patient to reach out to office if new, worrisome, or  unresolved symptoms arise or if no improvement in patient's condition. Patient verbalized understanding and is agreeable to  treatment plan. All questions answered to patient's satisfaction.    Thersia Schuyler Stark, [place]

## 2024-04-04 NOTE — Patient Instructions (Signed)
 Warm compresses 3-4 times a day Antibiotic ointment 3x a day with dressing change.   If worsening drainage, pain or redness, call the office.

## 2024-04-17 ENCOUNTER — Ambulatory Visit (HOSPITAL_BASED_OUTPATIENT_CLINIC_OR_DEPARTMENT_OTHER): Admitting: Family Medicine

## 2024-04-25 NOTE — Progress Notes (Signed)
 [facility] Refill Coordination Note    Specialty Medication(s) to be Shipped:   Transplant: Prograf  1mg     Other medication(s) to be shipped: No additional medications requested for fill at this time    Specialty Medications not needed at this time: N/A     Oscar [name], DOB: March 13, 1976  Phone: [phone] (home)       All above HIPAA information was verified with mom     Was a translator used for this call? No    Completed refill call assessment today to schedule patient's medication shipment from the [facility]  [phone]).  All relevant notes have been reviewed.     Specialty medication(s) and dose(s) confirmed: Regimen is correct and unchanged.   Changes to medications: Oscar [name] reports no changes at this time.  Changes to insurance: No  New side effects reported not previously addressed with a pharmacist or physician: None reported  Questions for the pharmacist: No    Confirmed patient received a Conservation Officer, Historic Buildings and a Surveyor, Mining with first shipment. The patient will receive a drug information handout for each medication shipped and additional FDA Medication Guides as required.       DISEASE/MEDICATION-SPECIFIC INFORMATION        N/A    SPECIALTY MEDICATION ADHERENCE     Medication Adherence    Patient reported X missed doses in the last month: 0  Specialty Medication: PROGRAF  1 mg capsule (tacrolimus )  Patient is on additional specialty medications: No  Adherence tools used: patient uses a pill box to manage medications  Support network for adherence: family member              Were doses missed due to medication being on hold? No      PROGRAF  1 mg capsule (tacrolimus ): 10-12 days of medicine on hand       Specialty medication is an injection or given on a cycle: No    REFERRAL TO PHARMACIST     Referral to the pharmacist: Not needed      [facility]     Shipping address confirmed in Epic.     Cost and Payment: Patient has a $0 copay, payment information is not required.    Delivery Scheduled: Yes, Expected medication delivery date: 05/02/24.     Medication will be delivered via UPS to the prescription address in Epic WAM.    Tom [facility]  Specialty Technician

## 2024-05-01 MED FILL — PROGRAF 1 MG CAPSULE: ORAL | 30 days supply | Qty: 120 | Fill #8

## 2024-05-18 ENCOUNTER — Other Ambulatory Visit (HOSPITAL_BASED_OUTPATIENT_CLINIC_OR_DEPARTMENT_OTHER): Payer: Self-pay | Admitting: Family Medicine

## 2024-05-18 DIAGNOSIS — G2581 Restless legs syndrome: Secondary | ICD-10-CM

## 2024-05-21 ENCOUNTER — Ambulatory Visit (HOSPITAL_BASED_OUTPATIENT_CLINIC_OR_DEPARTMENT_OTHER): Admitting: Family Medicine

## 2024-05-27 DIAGNOSIS — Z940 Kidney transplant status: Principal | ICD-10-CM

## 2024-05-27 MED ORDER — PROGRAF 1 MG CAPSULE
ORAL_CAPSULE | Freq: Two times a day (BID) | ORAL | 3 refills | 90.00000 days | Status: CP
Start: 2024-05-27 — End: 2025-05-27
  Filled 2024-06-04: qty 120, 30d supply, fill #0

## 2024-05-27 NOTE — Progress Notes (Signed)
 [facility] Refill Coordination Note    Specialty Medication(s) to be Shipped:   Transplant: Prograf  1mg     Other medication(s) to be shipped: No additional medications requested for fill at this time    Specialty Medications not needed at this time: N/A     Oscar [name], DOB: 10-26-1975  Phone: [phone] (home)       All above HIPAA information was verified with patient's family member, mom.     Was a nurse, learning disability used for this call? No    Completed refill call assessment today to schedule patient's medication shipment from the [facility] Pharm[facility]  [phone]).  All relevant notes have been reviewed.     Specialty medication(s) and dose(s) confirmed: Regimen is correct and unchanged.   Changes to medications: Oscar [name] reports no changes at this time.  Changes to insurance: No  New side effects reported not previously addressed with a pharmacist or physician: None reported  Questions for the pharmacist: No    Confirmed patient received a Conservation Officer, Historic Buildings and a Surveyor, Mining with first shipment. The patient will receive a drug information handout for each medication shipped and additional FDA Medication Guides as required.       DISEASE/MEDICATION-SPECIFIC INFORMATION        N/A    SPECIALTY MEDICATION ADHERENCE     Medication Adherence    Patient reported X missed doses in the last month: 0  Specialty Medication: PROGRAF  1 mg capsule (tacrolimus )  Patient is on additional specialty medications: No  Adherence tools used: patient uses a pill box to manage medications  Support network for adherence: family member              Were doses missed due to medication being on hold? No     PROGRAF  1 mg capsule (tacrolimus ) : 12-13 days of medicine on hand       Specialty medication is an injection or given on a cycle: No    REFERRAL TO PHARMACIST     Referral to the pharmacist: Not needed      [facility]     Shipping address confirmed in Epic.     Cost and Payment: Patient has a $0 copay, payment information is not required.    Delivery Scheduled: Yes, Expected medication delivery date: 06/04/24.  However, Rx request for refills was sent to the provider as there are none remaining.     Medication will be delivered via UPS to the prescription address in Epic WAM.    Tom [facility]  Specialty Technician

## 2024-06-03 NOTE — Progress Notes (Signed)
 Oscar [name] 's Prograf  shipment will be delayed as a result of no credit card on file to collect copayment.     I have reached out to the patient  at [phone] and left a voicemail message.  We will wait for a call back from the patient to reschedule the delivery.  We have not confirmed the new delivery date.

## 2024-06-04 NOTE — Progress Notes (Signed)
 Oscar [name] 's PROGRAF  1 mg capsule (tacrolimus ) shipment will be rescheduled as a result of credit card on file and copay collected.     I have reached out to the patient  via incoming phone call and communicated the delivery change. We will reschedule the medication for the delivery date that the patient agreed upon.  We have confirmed the delivery date as 06/05/2024

## 2024-06-05 ENCOUNTER — Ambulatory Visit (HOSPITAL_BASED_OUTPATIENT_CLINIC_OR_DEPARTMENT_OTHER): Admitting: Family Medicine

## 2024-06-05 ENCOUNTER — Encounter (HOSPITAL_BASED_OUTPATIENT_CLINIC_OR_DEPARTMENT_OTHER): Payer: Self-pay | Admitting: Family Medicine

## 2024-06-05 ENCOUNTER — Ambulatory Visit (HOSPITAL_BASED_OUTPATIENT_CLINIC_OR_DEPARTMENT_OTHER): Payer: Self-pay | Admitting: Family Medicine

## 2024-06-05 ENCOUNTER — Ambulatory Visit (INDEPENDENT_AMBULATORY_CARE_PROVIDER_SITE_OTHER)

## 2024-06-05 VITALS — BP 122/72 | HR 91 | Ht 72.0 in | Wt 120.6 lb

## 2024-06-05 DIAGNOSIS — D649 Anemia, unspecified: Secondary | ICD-10-CM | POA: Diagnosis not present

## 2024-06-05 DIAGNOSIS — M79672 Pain in left foot: Secondary | ICD-10-CM

## 2024-06-05 DIAGNOSIS — Q851 Tuberous sclerosis: Secondary | ICD-10-CM

## 2024-06-05 DIAGNOSIS — L0291 Cutaneous abscess, unspecified: Secondary | ICD-10-CM

## 2024-06-05 DIAGNOSIS — Z13220 Encounter for screening for lipoid disorders: Secondary | ICD-10-CM | POA: Diagnosis not present

## 2024-06-05 DIAGNOSIS — Z681 Body mass index (BMI) 19 or less, adult: Secondary | ICD-10-CM

## 2024-06-05 DIAGNOSIS — G40802 Other epilepsy, not intractable, without status epilepticus: Secondary | ICD-10-CM | POA: Diagnosis not present

## 2024-06-05 DIAGNOSIS — G40909 Epilepsy, unspecified, not intractable, without status epilepticus: Secondary | ICD-10-CM

## 2024-06-05 DIAGNOSIS — I10 Essential (primary) hypertension: Secondary | ICD-10-CM

## 2024-06-05 DIAGNOSIS — N1832 Chronic kidney disease, stage 3b: Secondary | ICD-10-CM

## 2024-06-05 LAB — LIPID PANEL
Chol/HDL Ratio: 3 ratio (ref 0.0–5.0)
Cholesterol, Total: 130 mg/dL (ref 100–199)
HDL: 44 mg/dL
LDL Chol Calc (NIH): 63 mg/dL (ref 0–99)
Triglycerides: 129 mg/dL (ref 0–149)
VLDL Cholesterol Cal: 23 mg/dL (ref 5–40)

## 2024-06-05 LAB — CBC WITH DIFFERENTIAL/PLATELET
Basophils Absolute: 0.1 x10E3/uL (ref 0.0–0.2)
Basos: 1 %
EOS (ABSOLUTE): 0.8 x10E3/uL — ABNORMAL HIGH (ref 0.0–0.4)
Eos: 6 %
Hematocrit: 32.5 % — ABNORMAL LOW (ref 37.5–51.0)
Hemoglobin: 10.7 g/dL — ABNORMAL LOW (ref 13.0–17.7)
Immature Grans (Abs): 0.1 x10E3/uL (ref 0.0–0.1)
Immature Granulocytes: 1 %
Lymphocytes Absolute: 2.1 x10E3/uL (ref 0.7–3.1)
Lymphs: 15 %
MCH: 29.5 pg (ref 26.6–33.0)
MCHC: 32.9 g/dL (ref 31.5–35.7)
MCV: 90 fL (ref 79–97)
Monocytes Absolute: 0.9 x10E3/uL (ref 0.1–0.9)
Monocytes: 7 %
Neutrophils Absolute: 9.8 x10E3/uL — ABNORMAL HIGH (ref 1.4–7.0)
Neutrophils: 70 %
Platelets: 261 x10E3/uL (ref 150–450)
RBC: 3.63 x10E6/uL — ABNORMAL LOW (ref 4.14–5.80)
RDW: 13.2 % (ref 11.6–15.4)
WBC: 13.8 x10E3/uL — ABNORMAL HIGH (ref 3.4–10.8)

## 2024-06-05 LAB — BASIC METABOLIC PANEL WITH GFR
BUN/Creatinine Ratio: 12 (ref 9–20)
BUN: 31 mg/dL — ABNORMAL HIGH (ref 6–24)
CO2: 19 mmol/L — ABNORMAL LOW (ref 20–29)
Calcium: 9.4 mg/dL (ref 8.7–10.2)
Chloride: 106 mmol/L (ref 96–106)
Creatinine, Ser: 2.66 mg/dL — ABNORMAL HIGH (ref 0.76–1.27)
Glucose: 111 mg/dL — ABNORMAL HIGH (ref 70–99)
Potassium: 5 mmol/L (ref 3.5–5.2)
Sodium: 140 mmol/L (ref 134–144)
eGFR: 29 mL/min/1.73 — ABNORMAL LOW

## 2024-06-05 NOTE — Progress Notes (Signed)
 "    Subjective:   Tyler Alvarez February 01, 1976 06/05/2024  Chief Complaint  Patient presents with   Medical Management of Chronic Issues      HPI: Tyler Alvarez presents today for re-assessment and management of chronic medical conditions. Patient has hx of tuberous sclerosis and has several specialists due to concerns of renal disease, chronic cyst formation of skin and neurological disease including hx of seizures. Patient reports upcoming appt with Neurology to discuss.   HYPERTENSION: Tyler Alvarez presents for the medical management of hypertension.  Patient's current hypertension medication regimen is: Amlodipine  5mg , Losartan  25mg  Patient is  currently taking prescribed medications for HTN.  Patient is  regularly keeping a check on BP at home.  Denies headache, dizziness, CP, SHOB, vision changes.    BP Readings from Last 3 Encounters:  06/05/24 122/72  04/04/24 (!) 145/97  03/31/24 (!) 136/98   CHRONIC KIDNEY DISEASE: Tyler Alvarez presents for the medical management of Chronic Kidney Disease and acute on chronic kidney injury due to hx of tuberous sclerosis. Patient is s/p renal transplant managed by [facility]plant team and [facility]. His last appt was Oct/November with nephrology. He has upcoming appt with [facility] Transplant in February 2025.  Patient is  adhering to renal diet. Patient is  on ACE1/ARB therapy.  Patient is  avoiding NSAIDS.     Does not attend dialysis and does not perform peritoneal dialysis.   Lab Results  Component Value Date   NA 140 06/05/2024   K 5.0 06/05/2024   CO2 19 (L) 06/05/2024   GLUCOSE 111 (H) 06/05/2024   BUN 31 (H) 06/05/2024   CREATININE 2.66 (H) 06/05/2024   CALCIUM 9.4 06/05/2024   EGFR 29 (L) 06/05/2024   GFRNONAA 36 (L) 12/15/2023    FOOT PAIN:  Patient states he has pain to left foot ongoing x 2-3 weeks after someone stomping on the foot. Denies decreased ROM, redness, or discoloration.  Using tylenol   with relief.    RECURRING ABSCESSES:  Patient previously managed by [facility] for frequent I&D for recurring cyst formation and abscess occurrence to generalized back due to tuberous sclerosis. Patient reports fluctuating induration to several cysts present to the back. Requesting referral to dermatology for further management. Patient was previously referred to [facility] but did not proceed with referral.     The following portions of the patient's history were reviewed and updated as appropriate: past medical history, past surgical history, family history, social history, allergies, medications, and problem list.   Patient Active Problem List   Diagnosis Date Noted   Abscess of buttock 12/12/2023   Underweight (BMI < 18.5) 05/30/2023   Abscess of multiple sites 03/22/2021   History of seizures 03/22/2021   Right foot pain 06/16/2020   Anemia 01/20/2020   Gastroesophageal reflux disease    Acute-on-chronic kidney injury 09/06/2019   Gout 10/09/2017   CKD (chronic kidney disease), stage III (HCC) 10/09/2017   Immunosuppression 04/25/2015   Normocytic anemia    Renal transplant, status post    Essential hypertension    Aneurysm 07/16/2014   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   Aftercare following organ transplant 05/23/2011   Attention deficit hyperactivity disorder (ADHD) 04/20/2011   Partial epilepsy with impairment of consciousness (HCC) 04/20/2011   HEARING LOSS, LEFT EAR 11/25/2008   Past Medical History:  Diagnosis Date   Abscess 03/22/2021   ADD (attention deficit disorder)    Anxiety    Ascending aortic  aneurysm    Benign skin lesion of neck    left neck cyst   Cellulitis 02/24/2021   Cellulitis and abscess of buttock    Cellulitis of right buttock 12/12/2023   Chronic kidney disease    s/p transplant 2001   Community acquired pneumonia 01/03/2020   Electrolyte abnormality 01/20/2020   Family history of adverse reaction to anesthesia     Pt mother stated Father-I think that he stopped breathing, they called a code but he came through okay   GERD (gastroesophageal reflux disease)    Headache    Migraines   Heart murmur    Hypertension    Immune deficiency disorder    Influenza A 07/30/2023   Intractable nausea and vomiting 07/30/2023   Restless leg syndrome    Seizures (HCC)    treated by Dr. Lane, no seizures in years and years   Sepsis (HCC) 02/25/2021   Sepsis without acute organ dysfunction (HCC)    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HCC)    Wears glasses    Past Surgical History:  Procedure Laterality Date   AV FISTULA PLACEMENT     KIDNEY TRANSPLANT  2001   LESION EXCISION WITH COMPLEX REPAIR Left 05/24/2021   Procedure: EXCISION BENIGN LESION NECK 9CM, LAYERED CLOSURE NECK 10CM;  Surgeon: Arelia Filippo, MD;  Location: [facility] OR;  Service: Plastics;  Laterality: Left;   MASS EXCISION Left 12/11/2016   Procedure: EXCISION OF BENIGN LESION OF THE NECK WITH LAYERED CLOSURE;  Surgeon: Arelia Filippo, MD;  Location: [facility] OR;  Service: Plastics;  Laterality: Left;   MASS EXCISION     neck x2   MASS EXCISION Bilateral 04/01/2018   Procedure: excision benign lesions left neck and right and left cheek ( 4cm, 2cm, 1cm x3), layered closure neck <10cm, layered closure right cheek 1cm;  Surgeon: Arelia Filippo, MD;  Location: [facility] OR;  Service: Plastics;  Laterality: Bilateral;  left cheek lesion   MULTIPLE TOOTH EXTRACTIONS     NEPHRECTOMY     both removed in 1999, 3 months apart   REVISON OF ARTERIOVENOUS FISTULA Left 07/16/2014   Procedure: EXCISION ANEURYSMAL AREA OF LEFT ARTERIOVENOUS FISTULA;  Surgeon: Gaile LELON New, MD;  Location: [facility] OR;  Service: Vascular;  Laterality: Left;   Family History  Problem Relation Age of Onset   Hypothyroidism Mother    Cancer Mother 50       cervical   Hypertension Mother    COPD Mother    COPD Father 69       COPD   Ulcers Father    COPD Brother    Heart disease Brother    Cancer  Other    Outpatient Medications Prior to Visit  Medication Sig Dispense Refill   acetaminophen  (TYLENOL ) 500 MG tablet Take 1,000 mg by mouth daily as needed for mild pain, headache or fever.     amLODipine  (NORVASC ) 5 MG tablet Take 1 tablet (5 mg total) by mouth daily.     gabapentin  (NEURONTIN ) 300 MG capsule TAKE 1 CAPSULE AT BEDTIME 90 capsule 2   losartan  (COZAAR ) 25 MG tablet Take 0.5 tablets (12.5 mg total) by mouth daily.     ondansetron  (ZOFRAN -ODT) 4 MG disintegrating tablet Take 1 tablet (4 mg total) by mouth every 8 (eight) hours as needed for nausea or vomiting. 20 tablet 0   predniSONE  (DELTASONE ) 5 MG tablet Take 5 mg by mouth daily.      tacrolimus  (PROGRAF ) 1 MG capsule Take 2 mg by mouth 2 (  two) times daily.      doxycycline  (VIBRA -TABS) 100 MG tablet Take 1 tablet (100 mg total) by mouth 2 (two) times daily. 14 tablet 0   No facility-administered medications prior to visit.   Allergies[1]   ROS: A complete ROS was performed with pertinent positives/negatives noted in the HPI. The remainder of the ROS are negative.    Objective:   Today's Vitals   06/05/24 1311 06/05/24 1327  BP: (!) 160/84 122/72  Pulse: 91   SpO2: 97%   Weight: 120 lb 9.6 oz (54.7 kg)   Height: 6' (1.829 m)   PainSc: 5    PainLoc: Foot     Physical Exam   GENERAL: Well-appearing, in NAD. Well nourished.  SKIN: Pink, warm and dry. Multiple dilated pores, scarrring from previous cysts present present to generalized back. No signs of acute infection, drainage.  Head: Normocephalic. NECK: Trachea midline. Full ROM w/o pain or tenderness.  RESPIRATORY: Chest wall symmetrical. Respirations even and non-labored. Breath sounds clear to auscultation bilaterally.  CARDIAC: S1, S2 present, regular rate and rhythm without murmur or gallops. Peripheral pulses 2+ bilaterally.  MSK: Muscle tone and strength appropriate for age. Joints w/o tenderness, redness, or swelling.  EXTREMITIES: Without  clubbing, cyanosis, or edema. Mild swelling present to anterior left foot with tenderness to 2nd and 3rd metatarsals. No obvious deformity or discoloration. +3 pedal pulses.  NEUROLOGIC: No motor or sensory deficits. Steady, even gait. C2-C12 intact.  PSYCH/MENTAL STATUS: Alert, oriented x 3. Cooperative, appropriate mood and affect.     Assessment & Plan:   1. Essential hypertension (Primary) Stable. Continue current regimen and home monitoring with BP cuff.   2. Stage 3b chronic kidney disease (HCC) Will continue management by [facility] and [facility] Transplant team. Check BMP with labs today.  - Basic metabolic panel with GFR  3. Normocytic anemia Stable previously. Will check CBC with labs today.  - CBC with Differential/Platelet  4. Left foot pain Concern for possible fracture given duration of pain. Will obtain xray. Recommended ice, elevation, rest.  - DG Foot Complete Left; Future  5. Screening for lipid disorders - Lipid panel  6. Cutaneous abscess, unspecified site 7. 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HCC) Discussed concerns of recurring cysts to back requiring frequent ABX use and I&D. Recommend establishing with Dermatology for further evaluation and management and patient agreeable. Referral placed.  - Ambulatory referral to Dermatology    8. Other epilepsy without status epilepticus, not intractable (HCC) Patient and mother deny recent neurological changes. Will continue with appt scheduled with Neurology on 06/10/24.    No orders of the defined types were placed in this encounter.  Lab Orders         Basic metabolic panel with GFR         CBC with Differential/Platelet         Lipid panel      Return in about 5 months (around 11/03/2024) for ANNUAL PHYSICAL.    Patient to reach out to office if new, worrisome, or unresolved symptoms arise or if no improvement in patient's condition. Patient verbalized understanding and is agreeable to treatment plan. All questions  answered to patient's satisfaction.    Thersia Schuyler Stark, FNP     [1]  Allergies Allergen Reactions   Erythromycin Nausea And Vomiting   Sulfa  Antibiotics Itching and Nausea And Vomiting   "

## 2024-06-05 NOTE — Progress Notes (Signed)
 Please call patient or patient's mother and let him know that the x-ray of his foot is negative for any signs of fracture.  Recommend continuing with Tylenol  as needed, rest, elevation, and ice.

## 2024-06-08 ENCOUNTER — Encounter (HOSPITAL_BASED_OUTPATIENT_CLINIC_OR_DEPARTMENT_OTHER): Payer: Self-pay | Admitting: Family Medicine

## 2024-06-08 NOTE — Progress Notes (Signed)
 Please let patient's mother know renal function slightly decreased but stable. He has appt in 1 month with [facility] team with labs, make sure they attend for repeat of labs. Please request records from [facility]. If patient desires, we can forward labs to [facility] provider if desired.   Electrolytes stable. WBC, Glucose chronically elevated due to chronic steroid use. Chronic anemia is stable.

## 2024-06-10 ENCOUNTER — Encounter: Payer: Self-pay | Admitting: Diagnostic Neuroimaging

## 2024-06-10 ENCOUNTER — Ambulatory Visit: Admitting: Diagnostic Neuroimaging
